# Patient Record
Sex: Male | Born: 1940 | Race: White | Hispanic: No | Marital: Married | Smoking: Never smoker
Health system: Southern US, Community
[De-identification: ages and names within clinical notes are randomized; demographics above are authoritative.]

## PROBLEM LIST (undated history)

## (undated) DIAGNOSIS — R011 Cardiac murmur, unspecified: Secondary | ICD-10-CM

## (undated) DIAGNOSIS — I442 Atrioventricular block, complete: Secondary | ICD-10-CM

## (undated) DIAGNOSIS — F32A Depression, unspecified: Secondary | ICD-10-CM

## (undated) DIAGNOSIS — I509 Heart failure, unspecified: Secondary | ICD-10-CM

## (undated) DIAGNOSIS — I447 Left bundle-branch block, unspecified: Secondary | ICD-10-CM

## (undated) DIAGNOSIS — E119 Type 2 diabetes mellitus without complications: Secondary | ICD-10-CM

## (undated) DIAGNOSIS — Z7722 Contact with and (suspected) exposure to environmental tobacco smoke (acute) (chronic): Secondary | ICD-10-CM

## (undated) DIAGNOSIS — F329 Major depressive disorder, single episode, unspecified: Secondary | ICD-10-CM

## (undated) DIAGNOSIS — C9110 Chronic lymphocytic leukemia of B-cell type not having achieved remission: Secondary | ICD-10-CM

## (undated) DIAGNOSIS — C6290 Malignant neoplasm of unspecified testis, unspecified whether descended or undescended: Secondary | ICD-10-CM

## (undated) DIAGNOSIS — I350 Nonrheumatic aortic (valve) stenosis: Secondary | ICD-10-CM

## (undated) DIAGNOSIS — E785 Hyperlipidemia, unspecified: Secondary | ICD-10-CM

## (undated) DIAGNOSIS — M1990 Unspecified osteoarthritis, unspecified site: Secondary | ICD-10-CM

## (undated) DIAGNOSIS — T4145XA Adverse effect of unspecified anesthetic, initial encounter: Secondary | ICD-10-CM

## (undated) HISTORY — DX: Malignant neoplasm of unspecified testis, unspecified whether descended or undescended: C62.90

## (undated) HISTORY — PX: CYST REMOVAL NECK: SHX6281

## (undated) HISTORY — DX: Contact with and (suspected) exposure to environmental tobacco smoke (acute) (chronic): Z77.22

## (undated) HISTORY — PX: EYE SURGERY: SHX253

## (undated) HISTORY — DX: Cardiac murmur, unspecified: R01.1

## (undated) HISTORY — DX: Hyperlipidemia, unspecified: E78.5

## (undated) HISTORY — PX: OTHER SURGICAL HISTORY: SHX169

## (undated) HISTORY — DX: Type 2 diabetes mellitus without complications: E11.9

## (undated) HISTORY — DX: Nonrheumatic aortic (valve) stenosis: I35.0

## (undated) HISTORY — DX: Atrioventricular block, complete: I44.2

## (undated) HISTORY — DX: Major depressive disorder, single episode, unspecified: F32.9

## (undated) HISTORY — PX: INNER EAR SURGERY: SHX679

## (undated) HISTORY — DX: Depression, unspecified: F32.A

## (undated) HISTORY — DX: Chronic lymphocytic leukemia of B-cell type not having achieved remission: C91.10

---

## 2000-07-10 ENCOUNTER — Ambulatory Visit (HOSPITAL_COMMUNITY): Admission: RE | Admit: 2000-07-10 | Discharge: 2000-07-10 | Payer: Self-pay | Admitting: Physical Therapy

## 2001-04-25 ENCOUNTER — Encounter: Payer: Self-pay | Admitting: *Deleted

## 2001-04-25 ENCOUNTER — Ambulatory Visit (HOSPITAL_COMMUNITY): Admission: RE | Admit: 2001-04-25 | Discharge: 2001-04-25 | Payer: Self-pay | Admitting: *Deleted

## 2001-12-31 ENCOUNTER — Ambulatory Visit (HOSPITAL_COMMUNITY): Admission: RE | Admit: 2001-12-31 | Discharge: 2001-12-31 | Payer: Self-pay | Admitting: *Deleted

## 2001-12-31 ENCOUNTER — Encounter: Payer: Self-pay | Admitting: *Deleted

## 2002-07-30 ENCOUNTER — Encounter (INDEPENDENT_AMBULATORY_CARE_PROVIDER_SITE_OTHER): Payer: Self-pay | Admitting: Specialist

## 2002-07-30 ENCOUNTER — Ambulatory Visit (HOSPITAL_BASED_OUTPATIENT_CLINIC_OR_DEPARTMENT_OTHER): Admission: RE | Admit: 2002-07-30 | Discharge: 2002-07-31 | Payer: Self-pay | Admitting: Otolaryngology

## 2002-07-30 HISTORY — PX: OTHER SURGICAL HISTORY: SHX169

## 2002-11-07 ENCOUNTER — Encounter: Payer: Self-pay | Admitting: *Deleted

## 2002-11-07 ENCOUNTER — Ambulatory Visit (HOSPITAL_COMMUNITY): Admission: RE | Admit: 2002-11-07 | Discharge: 2002-11-07 | Payer: Self-pay | Admitting: *Deleted

## 2003-10-15 ENCOUNTER — Ambulatory Visit (HOSPITAL_COMMUNITY): Admission: RE | Admit: 2003-10-15 | Discharge: 2003-10-15 | Payer: Self-pay | Admitting: Oncology

## 2004-10-13 ENCOUNTER — Ambulatory Visit: Payer: Self-pay | Admitting: Oncology

## 2004-10-14 ENCOUNTER — Ambulatory Visit (HOSPITAL_COMMUNITY): Admission: RE | Admit: 2004-10-14 | Discharge: 2004-10-14 | Payer: Self-pay | Admitting: Oncology

## 2005-10-12 ENCOUNTER — Ambulatory Visit: Payer: Self-pay | Admitting: Oncology

## 2005-10-20 ENCOUNTER — Ambulatory Visit (HOSPITAL_COMMUNITY): Admission: RE | Admit: 2005-10-20 | Discharge: 2005-10-20 | Payer: Self-pay | Admitting: Oncology

## 2005-10-26 ENCOUNTER — Ambulatory Visit (HOSPITAL_COMMUNITY): Admission: RE | Admit: 2005-10-26 | Discharge: 2005-10-26 | Payer: Self-pay | Admitting: Oncology

## 2006-10-09 ENCOUNTER — Ambulatory Visit: Payer: Self-pay | Admitting: Oncology

## 2006-10-12 ENCOUNTER — Ambulatory Visit (HOSPITAL_COMMUNITY): Admission: RE | Admit: 2006-10-12 | Discharge: 2006-10-12 | Payer: Self-pay | Admitting: Oncology

## 2006-10-12 LAB — COMPREHENSIVE METABOLIC PANEL
ALT: 31 U/L (ref 0–53)
AST: 21 U/L (ref 0–37)
Alkaline Phosphatase: 77 U/L (ref 39–117)
BUN: 22 mg/dL (ref 6–23)
Chloride: 107 mEq/L (ref 96–112)
Creatinine, Ser: 1.15 mg/dL (ref 0.40–1.50)
Potassium: 4.4 mEq/L (ref 3.5–5.3)

## 2006-10-12 LAB — CBC WITH DIFFERENTIAL/PLATELET
BASO%: 0.5 % (ref 0.0–2.0)
EOS%: 2.1 % (ref 0.0–7.0)
LYMPH%: 30.8 % (ref 14.0–48.0)
MCH: 33.3 pg (ref 28.0–33.4)
MCHC: 35.2 g/dL (ref 32.0–35.9)
MONO#: 0.4 10*3/uL (ref 0.1–0.9)
NEUT%: 58.2 % (ref 40.0–75.0)
RBC: 4.19 10*6/uL — ABNORMAL LOW (ref 4.20–5.71)
WBC: 4.5 10*3/uL (ref 4.0–10.0)
lymph#: 1.4 10*3/uL (ref 0.9–3.3)

## 2006-10-12 LAB — MORPHOLOGY
PLT EST: ADEQUATE
RBC Comments: NORMAL

## 2006-11-08 ENCOUNTER — Ambulatory Visit (HOSPITAL_COMMUNITY): Admission: RE | Admit: 2006-11-08 | Discharge: 2006-11-08 | Payer: Self-pay | Admitting: Family Medicine

## 2006-11-13 ENCOUNTER — Ambulatory Visit: Payer: Self-pay | Admitting: Internal Medicine

## 2006-11-19 ENCOUNTER — Encounter: Payer: Self-pay | Admitting: Internal Medicine

## 2006-11-19 ENCOUNTER — Ambulatory Visit: Payer: Self-pay

## 2007-01-24 ENCOUNTER — Ambulatory Visit: Payer: Self-pay | Admitting: Internal Medicine

## 2007-10-01 ENCOUNTER — Ambulatory Visit: Payer: Self-pay | Admitting: Internal Medicine

## 2007-10-01 ENCOUNTER — Encounter: Payer: Self-pay | Admitting: Internal Medicine

## 2007-10-09 ENCOUNTER — Ambulatory Visit: Payer: Self-pay | Admitting: Oncology

## 2007-10-11 ENCOUNTER — Ambulatory Visit (HOSPITAL_COMMUNITY): Admission: RE | Admit: 2007-10-11 | Discharge: 2007-10-11 | Payer: Self-pay | Admitting: Oncology

## 2007-10-11 LAB — CBC WITH DIFFERENTIAL/PLATELET
Basophils Absolute: 0 10*3/uL (ref 0.0–0.1)
Eosinophils Absolute: 0.1 10*3/uL (ref 0.0–0.5)
HCT: 43.1 % (ref 38.7–49.9)
HGB: 14.4 g/dL (ref 13.0–17.1)
LYMPH%: 20.7 % (ref 14.0–48.0)
MCV: 95.7 fL (ref 81.6–98.0)
MONO#: 0.5 10*3/uL (ref 0.1–0.9)
MONO%: 8.2 % (ref 0.0–13.0)
NEUT#: 4.3 10*3/uL (ref 1.5–6.5)
NEUT%: 69.2 % (ref 40.0–75.0)
Platelets: 193 10*3/uL (ref 145–400)
RBC: 4.5 10*6/uL (ref 4.20–5.71)

## 2007-10-11 LAB — COMPREHENSIVE METABOLIC PANEL
Albumin: 4.2 g/dL (ref 3.5–5.2)
BUN: 20 mg/dL (ref 6–23)
CO2: 28 mEq/L (ref 19–32)
Calcium: 9.3 mg/dL (ref 8.4–10.5)
Chloride: 107 mEq/L (ref 96–112)
Glucose, Bld: 107 mg/dL — ABNORMAL HIGH (ref 70–99)
Potassium: 4.1 mEq/L (ref 3.5–5.3)
Sodium: 144 mEq/L (ref 135–145)
Total Protein: 6.6 g/dL (ref 6.0–8.3)

## 2007-10-11 LAB — PSA: PSA: 4.16 ng/mL — ABNORMAL HIGH (ref 0.10–4.00)

## 2007-10-16 ENCOUNTER — Ambulatory Visit: Payer: Self-pay | Admitting: Internal Medicine

## 2008-11-13 ENCOUNTER — Ambulatory Visit: Payer: Self-pay

## 2008-11-13 ENCOUNTER — Encounter: Payer: Self-pay | Admitting: Internal Medicine

## 2008-12-04 DIAGNOSIS — I359 Nonrheumatic aortic valve disorder, unspecified: Secondary | ICD-10-CM | POA: Insufficient documentation

## 2008-12-04 DIAGNOSIS — C9190 Lymphoid leukemia, unspecified not having achieved remission: Secondary | ICD-10-CM | POA: Insufficient documentation

## 2008-12-04 DIAGNOSIS — E785 Hyperlipidemia, unspecified: Secondary | ICD-10-CM | POA: Insufficient documentation

## 2008-12-04 DIAGNOSIS — F329 Major depressive disorder, single episode, unspecified: Secondary | ICD-10-CM | POA: Insufficient documentation

## 2008-12-04 DIAGNOSIS — F3289 Other specified depressive episodes: Secondary | ICD-10-CM | POA: Insufficient documentation

## 2008-12-04 DIAGNOSIS — C6290 Malignant neoplasm of unspecified testis, unspecified whether descended or undescended: Secondary | ICD-10-CM | POA: Insufficient documentation

## 2008-12-04 DIAGNOSIS — J45909 Unspecified asthma, uncomplicated: Secondary | ICD-10-CM | POA: Insufficient documentation

## 2008-12-07 ENCOUNTER — Ambulatory Visit: Payer: Self-pay | Admitting: Internal Medicine

## 2008-12-07 ENCOUNTER — Encounter: Payer: Self-pay | Admitting: Internal Medicine

## 2009-11-25 ENCOUNTER — Ambulatory Visit (HOSPITAL_COMMUNITY): Admission: RE | Admit: 2009-11-25 | Discharge: 2009-11-25 | Payer: Self-pay | Admitting: Internal Medicine

## 2009-11-25 ENCOUNTER — Encounter: Payer: Self-pay | Admitting: Internal Medicine

## 2009-11-25 ENCOUNTER — Ambulatory Visit: Payer: Self-pay | Admitting: Cardiology

## 2009-11-25 ENCOUNTER — Ambulatory Visit: Payer: Self-pay

## 2009-12-15 ENCOUNTER — Ambulatory Visit: Payer: Self-pay | Admitting: Internal Medicine

## 2010-06-03 ENCOUNTER — Telehealth: Payer: Self-pay | Admitting: Internal Medicine

## 2010-07-27 ENCOUNTER — Ambulatory Visit: Payer: Self-pay | Admitting: Internal Medicine

## 2010-09-27 NOTE — Assessment & Plan Note (Signed)
Summary: f1y/ appt is 1200/ gd   Visit Type:  Follow-up Primary Provider:  Dr Johnn Hai  CC:  Got bad headache when lifting a piece of wood.  History of Present Illness: [name] is 70 y/o male with h/o moderate AS and hyperlipidemia returns for routine f/u.  Doing well. Much less active after his dog died. Able to do almost all of his activities without any CP, SOB, dizziness of HF symptoms. Feels winded if he walks 3 or 4 flights of steps. No change.  Echo last month showed stable aortic stensosis in the moderate (mean 23mm HG) with normal LV function.+ pseudonormal filling  No edema, orthopnea or PND. Was on ladder lifting heavy piece of wood and got headache.   Current Medications (verified): 1)  Trazodone Hcl 50 Mg Tabs (Trazodone Hcl) .... 3 By Mouth Once Daily 2)  Zoloft 100 Mg Tabs (Sertraline Hcl) .Marland Kitchen.. 1 By Mouth Once Daily 3)  Buspirone Hcl 15 Mg Tabs (Buspirone Hcl) .Marland Kitchen.. 1 Tab in The Am and 2 Tab At Noon 4)  Veramyst 27.5 Mcg/spray Susp (Fluticasone Furoate) .... Once Daily 5)  Clarinex 5 Mg Tabs (Desloratadine) .Marland Kitchen.. 1 By Mouth Once Daily 6)  Gabapentin 300 Mg Caps (Gabapentin) .Marland Kitchen.. 1 in The Am and 2 At [facility] 7)  Lipitor 80 Mg Tabs (Atorvastatin Calcium) .... Take 1/2 Once Daily 8)  Cholestyramine  Powd (Cholestyramine) .... 2 Scoops Once Daily 9)  Restasis 0.05 % Emul (Cyclosporine) .... Two Times A Day 10)  Apap 500 Mg Caps (Acetaminophen) .Marland Kitchen.. 1 By Mouth Once Daily 11)  Lovaza 1 Gm Caps (Omega-3-Acid Ethyl Esters) .... 4 Once Daily 12)  Proair Hfa 108 (90 Base) Mcg/act Aers (Albuterol Sulfate) .... Prn 13)  Celebrex 200 Mg Caps (Celecoxib) .... As Needed 14)  Multivitamins  Tabs (Multiple Vitamin) .... Take 1 Once Daily 15)  Aspirin 81 Mg Tabs (Aspirin) .... Take 1 Once Daily  Allergies (verified): 1)  ! Penicillin  Past History:  Past Medical History: Last updated: 12/04/2008 1. Aortic stenosis, moderate     a. Echo 3/10: EF 55-60%. Mean gradient 25 mmHg. AVA  1.23cm2     b. Exercise Myoview 2008: Positive ECG. Normal perfusion.  2. Extensive exposure to secondhand smoke. 3. Testicular cancer s/p XRT and surgical resection  4. Chronic lymphocytic leukemia followed by Dr. Cyndie Chime. 5. Hyperlipidemia, treated. 6. Depression.  7. Asthma  Review of Systems       As per HPI and past medical history; otherwise all systems negative.   Vital Signs:  Patient profile:   70 year old male Height:      64 inches Weight:      143 pounds BMI:     24.63 Pulse rate:   64 / minute BP sitting:   118 / 62  (left arm) Cuff size:   regular  Vitals Entered By: Hardin Negus, RMA (December 15, 2009 12:08 PM)  Physical Exam  General:  Gen: well appearing. no resp difficulty HEENT: normal Neck: supple. no JVD. Carotids 2+ bilat; not bruits.  Cor: PMI nondisplaced. Regular rate & rhythm. No rubs, gallops. 2/6 mid-peaking AS murmur. S2 is mildly diminished Lungs: clear Abdomen: soft, nontender, nondistended. No hepatosplenomegaly. No bruits or masses. Good bowel sounds. Extremities: no cyanosis, clubbing, rash, edema Neuro: alert & orientedx3, cranial nerves grossly intact. moves all 4 extremities w/o difficulty. affect pleasant    Impression & Recommendations:  Problem # 1:  AORTIC STENOSIS (ICD-424.1) He has at least moderate AS but  remains only minimally symptomatic at worst without any evidence of LV dysfunction. We discussed the fact that he will likely need AVR in the relatively near future as his symptoms dictate but it doesn't appear as if he has progressed to that point yet. We will continue to follow him closely. We discussed symptoms to watch out for.  Other Orders: EKG w/ Interpretation (93000)  Patient Instructions: 1)  Follow up in 6 months

## 2010-09-27 NOTE — Progress Notes (Signed)
Summary: dose pt need echo  Phone Note Call from Patient Call back at Home Phone [phone]   Caller: Patient Reason for Call: Talk to Nurse, Talk to Doctor Summary of Call: pt wants to know if needs an echo prior to the Nov f/u appt Initial call taken by: Omer Jack,  June 03, 2010 10:41 AM  Follow-up for Phone Call        pt has been having 2 D Echo once a year.  Will clarify with MD if he  wants one sooner.  pt aware. Follow-up by: Charolotte Capuchin, RN,  June 03, 2010 10:53 AM     Appended Document: dose pt need echo would continue yearly echos (march) unless he is having symptoms of dyspnea, CP, CHF, dizziness or increasing fatigue.   Appended Document: dose pt need echo Pt is aware.  He has also found out that he is diabetic. His diabetes is currently under control with diet and a "little bit" of exercise according to patient. Mylo Red RN

## 2010-09-27 NOTE — Assessment & Plan Note (Signed)
Summary: f60m   Visit Type:  Follow-up Primary Provider:  Dr Johnn Hai  CC:  no complaints.  History of Present Illness: [name] is 70 y/o male with h/o moderate AS and hyperlipidemia returns for routine f/u.  Doing well. 2 months diagnosed with DM2. Started plan of diet and exerise. At first dyspneic on hills but now better. Walks 3.5 mph. No CP. No dizziness. No edema, orthopnea or PND.  Echo in March 2011 showed stable aortic stensosis in the moderate (mean 23mm HG) with normal LV function.+ pseudonormal filling    Current Medications (verified): 1)  Trazodone Hcl 50 Mg Tabs (Trazodone Hcl) .... 3 By Mouth Once Daily 2)  Zoloft 100 Mg Tabs (Sertraline Hcl) .Marland Kitchen.. 1 By Mouth Once Daily 3)  Buspirone Hcl 15 Mg Tabs (Buspirone Hcl) .Marland Kitchen.. 1 Tab in The Am and 2 Tab At Noon 4)  Flonase 50 Mcg/act Susp (Fluticasone Propionate) .... Once Daily 5)  Clarinex 5 Mg Tabs (Desloratadine) .Marland Kitchen.. 1 By Mouth Once Daily 6)  Gabapentin 300 Mg Caps (Gabapentin) .Marland Kitchen.. 1 in The Am and 2 At [facility] 7)  Lipitor 80 Mg Tabs (Atorvastatin Calcium) .... Take 1/2 Once Daily 8)  Cholestyramine  Powd (Cholestyramine) .Marland Kitchen.. 1 Scoops Once Daily 9)  Restasis 0.05 % Emul (Cyclosporine) .... Two Times A Day 10)  Acetaminophen 500 Mg  Caps (Acetaminophen) .... 3 Daily 11)  Lovaza 1 Gm Caps (Omega-3-Acid Ethyl Esters) .... 4 Once Daily 12)  Multivitamins  Tabs (Multiple Vitamin) .... Take 1 Once Daily 13)  Aspirin 81 Mg Tabs (Aspirin) .... Take 1 Once Daily 14)  Fenofibrate 160 Mg Tabs (Fenofibrate) .... Once Daily  Allergies (verified): 1)  ! Penicillin  Past History:  Past Medical History: 1. Aortic stenosis, moderate     a. Echo 3/10: EF 55-60%. Mean gradient 25 mmHg. AVA 1.23cm2     b. Ech 3/11: Normal EF. Mean AVA gradient      b. Exercise Myoview 2008: Positive ECG. Normal perfusion.  2. Extensive exposure to secondhand smoke. 3. Testicular cancer s/p XRT and surgical resection  4. Chronic lymphocytic  leukemia followed by Dr. Cyndie Chime. 5. Hyperlipidemia, treated. 6. Depression.  7. Asthma 8. DM2  Review of Systems       As per HPI and past medical history; otherwise all systems negative.   Vital Signs:  Patient profile:   70 year old male Height:      64 inches Weight:      132 pounds BMI:     22.74 Pulse rate:   62 / minute BP sitting:   108 / 60  (right arm) Cuff size:   regular  Vitals Entered By: Hardin Negus, RMA (July 27, 2010 8:41 AM)  Physical Exam  General:  well appearing. no resp difficulty HEENT: normal Neck: supple. no JVD. Carotids 2+ bilat with bilat radiated bruits.  Cor: PMI nondisplaced. Regular rate & rhythm. No rubs, gallops. 2/6 mid-peaking AS murmur. S2 is still fairly crisp Lungs: clear Abdomen: soft, nontender, nondistended. No hepatosplenomegaly. No bruits or masses. Good bowel sounds. Extremities: no cyanosis, clubbing, rash, edema Neuro: alert & orientedx3, cranial nerves grossly intact. moves all 4 extremities w/o difficulty. affect pleasant    Impression & Recommendations:  Problem # 1:  AORTIC STENOSIS (ICD-424.1) AS very stable. Exercise tolerance preserved. Repeat echo in March. We discussed fact that if/when he needs AVR in future percutaneous AVRs will likely be commonplace.   Other Orders: EKG w/ Interpretation (93000) Echocardiogram (Echo)  Patient  Instructions: 1)  Your physician recommends that you schedule a follow-up appointment in: 9 months 2)  Your physician has requested that you have an echocardiogram.  Echocardiography is a painless test that uses sound waves to create images of your heart. It provides your doctor with information about the size and shape of your heart and how well your heart's chambers and valves are working.  This procedure takes approximately one hour. There are no restrictions for this procedure.

## 2010-10-31 ENCOUNTER — Ambulatory Visit (HOSPITAL_COMMUNITY): Payer: Medicare Other | Attending: Cardiology

## 2010-10-31 DIAGNOSIS — C9590 Leukemia, unspecified not having achieved remission: Secondary | ICD-10-CM | POA: Insufficient documentation

## 2010-10-31 DIAGNOSIS — E785 Hyperlipidemia, unspecified: Secondary | ICD-10-CM | POA: Insufficient documentation

## 2010-10-31 DIAGNOSIS — I359 Nonrheumatic aortic valve disorder, unspecified: Secondary | ICD-10-CM | POA: Insufficient documentation

## 2010-10-31 DIAGNOSIS — E119 Type 2 diabetes mellitus without complications: Secondary | ICD-10-CM | POA: Insufficient documentation

## 2010-11-03 ENCOUNTER — Telehealth: Payer: Self-pay | Admitting: Internal Medicine

## 2010-11-08 NOTE — Progress Notes (Signed)
Summary: test results  Phone Note Call from Patient Call back at Work Phone [phone]   Caller: Patient Reason for Call: Talk to Doctor, Lab or Test Results Initial call taken by: Lorne Skeens,  November 03, 2010 9:31 AM  Follow-up for Phone Call        [facility] Katina Dung, RN, BSN  November 03, 2010 9:45 AM --I discussed echo results from 10/31/10 with pt

## 2011-01-10 NOTE — Assessment & Plan Note (Signed)
[name]                            CARDIOLOGY OFFICE NOTE   Skinner, Tyler                       MRN:          [id number]  DATE:10/16/2007                            DOB:          [birth year]    PRIMARY CARE PHYSICIAN:  Emeterio Reeve, MD   INTERVAL HISTORY:  [name] is a delightful 70 year old male with a  history of mild to moderate aortic stenosis, hyperlipidemia and chronic  lymphocytic leukemia who returns today for routine followup.   He continues to do well.  He denies any chest pain or shortness of  breath.  He is walking about half mile a little bit more twice a day  with his dog.  He has not had any problems going up hills or other  symptoms.  No orthopnea, no PND.  No pre-syncope.   CURRENT MEDICATIONS:  1. Trazodone 50 mg a night.  2. Zoloft 100 a day.  3. Buspirone 50 mg t.i.d.  4. Nasonex.  5. Clarinex.  6. Gabapentin 300 t.i.d.  7. Lipitor 40 day.  8. Cholestyramine two scoops daily.  9. Restasis one drop b.i.d.  10.Lovaza 1 gram 4 tablets daily.   PHYSICAL EXAM:  He is well-appearing in no acute distress. He ambulates  around the clinic without respiratory difficulty.  Blood pressure is  100/58, heart rate 70,  weight is 144.  HEENT is normal.  Neck is supple.  There is no JVD.  Carotids 2+ bilaterally with  bilateral bruits.  There is no lymphadenopathy or thyromegaly.  CARDIAC:  PMI is nondisplaced.  Irregular rate and rhythm with 3/6  systolic ejection murmur at the right sternal border which radiates  across the precordium.  S2 is preserved.  LUNGS:  Clear.  ABDOMEN:  Soft, nontender, nondistended.  There is no  hepatosplenomegaly, no bruits, no masses.  Good bowel sounds.  EXTREMITIES:  Warm, cyanosis, clubbing or edema.  No rash.  NEURO:  Alert and oriented x3.  Cranial nerves II-XII are intact.  Moves  all four extremities without difficulty.  Affect is pleasant.   EKG shows sinus rhythm with no  significant ST-T wave abnormalities. Rate  of 70.  Echocardiogram shows an ejection fraction of 60%.  There is  moderate LVH.  His aortic valve was moderate to markedly calcified.  There is a mean of the mean gradient across which is 21.  However, the  valve area calculated 0.9.   ASSESSMENT/PLAN:  1. Aortic stenosis. This is in the moderate range.  He is      asymptomatic. Continued followup with yearly echocardiograms.  2. Hyperlipidemia.  This is followed by Dr. Paulino Rily. I asked him to      send a copy of his lipid panel when he gets a chance.   DISPOSITION:  Will see him back in 1 year with a repeat echocardiogram.     Bevelyn Buckles. Bensimhon, MD  Electronically Signed    DRB/MedQ  DD: 10/16/2007  DT: 10/16/2007  Job #: [id number]   cc:   Emeterio Reeve, MD

## 2011-01-10 NOTE — Assessment & Plan Note (Signed)
[facility]                            CARDIOLOGY OFFICE NOTE   DREAM, NODAL                       MRN:          [id number]  DATE:01/24/2007                            DOB:          1940/11/10    PRIMARY CARE PHYSICIAN:  Dr. Mila Palmer.   INTERVAL HISTORY:  Mr. Capano is a delightful 70 year old male with a  history of mild to moderate aortic stenosis, hyperlipidemia, and chronic  lymphocytic leukemia who returns today for routine followup.   Since we last saw him he also underwent stress testing, he walked 9  minutes on a Bruce protocol; stopped due to fatigue, no chest pain.  EKG  was positive, but perfusion imaging was normal.  He tells me today that  he is doing quite well.  He continues to walk frequently with his dog  and says he only gets short of breath when he really pushes it up the  hills, otherwise he is doing fine.  No chest pain, no orthopnea, no PND,  no lower extremity edema.   CURRENT MEDICATIONS:  1. Trazodone 150 a day.  2. Zoloft 100 a day.  3. Buspirone 15 mg t.i.d.  4. Nasonex.  5. Clarinex.  6. Neurontin 300 t.i.d.  7. Lipitor 80 a day.  8. Cholestyramine 2 scoops a day.  9. Restasis 1 drop b.i.d.  10.Tylenol.   PHYSICAL EXAMINATION:  He is well-appearing, no acute distress,  ambulates around the clinic without and respiratory difficulty.  Blood  pressure is 126/68, heart rate is 78, weight is 149.  HEENT:  Normal.  NECK:  Supple, no JVD, carotids are 2+ bilaterally with bilateral  radiated bruits, there is no lymphadenopathy or thyromegaly.  CARDIAC:  He has a regular rate and rhythm with a 2/6 mid-peaking  systolic ejection murmur at the right sternal border, S2 is crisp.  LUNGS:  Clear.  ABDOMEN:  Soft, nontender, nondistended, no hepatosplenomegaly, no  bruits, no masses, good bowel sounds.  EXTREMITIES:  Warm with no cyanosis, clubbing, or edema, no rash, good  distal pulses.    ASSESSMENT/PLAN:  1. Aortic stenosis, this is mild to moderate with a mean gradient of      14.  He does have mild symptoms from this but is obviously not      ready for surgical intervention.  We will see him back in 1 year      for yearly followup.  2. Hyperlipidemia, this is followed by Dr. Paulino Rily.  I did ask him to      bring me his lipid values just so I could take a look at them.  3. Abnormal stress test.  With ST depression during exercise but      normal ejection fraction and no perfusion defects.  I suspect this      may be a false positive or related to his aortic stenosis.  Given      that he is asymptomatic I will not pursue any further.  Obviously      if his symptoms were to progress or he would need aortic valve  replacement then he would need to proceed with catheterization.   DISPOSITION:  We will see him back in 9 months for his echocardiogram.     Bevelyn Buckles. Bensimhon, MD  Electronically Signed    DRB/MedQ  DD: 01/24/2007  DT: 01/24/2007  Job #: [id number]   cc:   Emeterio Reeve, MD

## 2011-01-13 NOTE — Op Note (Signed)
[name], [name]                          ACCOUNT NO.:  [id number]   MEDICAL RECORD NO.:  [id number]                   PATIENT TYPE:  AMB   LOCATION:  [facility]SC                                  FACILITY:  [facility]   PHYSICIAN:  Jefry H. Pollyann Kennedy, M.D.                DATE OF BIRTH:  06/11/1941   DATE OF PROCEDURE:  07/30/2002  DATE OF DISCHARGE:                                 OPERATIVE REPORT   PREOPERATIVE DIAGNOSES:  1. Right conductive hearing loss.  2. Suspicious for otosclerosis.   POSTOPERATIVE DIAGNOSES:  1. Right conductive hearing loss.  2. Suspicious for otosclerosis.   OPERATION PERFORMED:  Right stapedectomy.   SURGEON:  Jefry H. Pollyann Kennedy, M.D.   ANESTHESIA:  General endotracheal.   COMPLICATIONS:  None.   DISPOSITION:  The patient tolerated the procedure well, was awakened  extubated and transferred to recovery in stable condition.   FINDINGS:  Stiff stapes foot plate, with otosclerotic changes surrounding  the annular ligament.  The slightly malpositioned incus lenticular process  angled posteriorly slightly away from the oval window niche.   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  The risks, benefits, alternatives  and complications of the procedure were explained to the patient, who  seemed to understand and agreed to surgery.   DESCRIPTION OF PROCEDURE:  The patient was taken to the operating room and  placed on the operating table in the supine position.  Following induction  of general endotracheal anesthesia which was complicated by difficulty with  intubation.  The patient was prepped and draped in standard fashion.  Using  the operating microscope and sterile technique, the ear canal was injected  with 1% Xylocaine with epinephrine in four quadrants.  There was good  blanching of the tympanic membrane.  A  posterior superior tympanomeatal flap  was developed and brought anteriorly.  The chorda tympani nerve was  identified and left in place.  The middle ear was exposed and the ossicles  were examined.  They did not have a visible round window reflex prior to the  manipulation of the ossicular chain.  The stapes was palpated and the above  mentioned findings were noted.  The incudostapedial joint was separated with  an IS knife.  A control hole was placed in the center of the footplate using  an otologic pick.  The stapedial tendon was cut using Bellucci scissors.  There was no overhanging facial nerve.  The stapes superstructure was down-  fractured toward the promontory and the anterior two thirds of the foot  plate came up along with the superstructure.  This was easily removed.  The  posterior one third or one quarter of the foot plate was left intact and was  obscured by the angled incus lenticular process and attempts  were not made  to remove this bone in this area.  A _______ modified bucket handle  prosthesis 4.5 mm length was used.  A previously harvested tragal cartilage  perichondral graft was placed onto the oval window niche.  The prosthesis  was placed into good position and the bucket handle was lifted over the  lenticular process.  It was nice and secure.  There was good ossicular chain  mobility.  The anterior tympanic cavity was packed with saline soaked  Gelfoam to plug the eustachian tube orifice.  The tympanomeatal flap was  brought back to its normal position and secured in place using saline soaked  Gelfoam and then the external auditory canal Cortisporin soaked Gelfoam.  A  5-0 plain gut suture was used to reapproximate the tragal skin from the  harvest.  A cotton ball with bacitracin was placed in the external meatus.  The patient then awakened, extubated and transferred to recovery in stable  condition.                                                 Jefry H.  Pollyann Kennedy, M.D.    JHR/MEDQ  D:  07/31/2002  T:  07/31/2002  Job:  [id number]

## 2011-01-13 NOTE — Procedures (Signed)
[facility]  Patient:    [name], [name]                       MRN: [id number] Proc. Date: 07/10/00 Attending:  Everardo All. Madilyn Fireman, M.D. CC:         Joycelyn Rua, M.D.   Procedure Report  PROCEDURE:  Colonoscopy.  INDICATION FOR PROCEDURE:  Hemoccult positive stools.  DESCRIPTION OF PROCEDURE:  The patient was placed in the left lateral decubitus position and placed on the pulse monitor with continuous low-flow oxygen delivered by nasal cannula.  He was sedated with 60 mg IV Demerol and 5 mg IV Versed.  The Olympus video colonoscope was inserted into the rectum and advanced to the cecum, confirmed by transillumination at McBurneys point and visualization of the ileocecal valve and appendiceal orifice.  The prep was good.  The cecum appeared normal.  In the ascending, transverse, descending, and sigmoid colon were seen scattered diverticula, moderately concentrated with no other mucosal abnormalities, polyps, or masses.  The rectum appeared normal, and retroflex view of the anus did reveal some small internal hemorrhoids.  The colonoscope was then withdrawn and the patient returned to the recovery room in stable condition.  He tolerated the procedure well, and there were no immediate complications.  IMPRESSION:  Right and left-sided diverticulosis, otherwise normal colonoscopy.  PLAN:  Continue average risk colon cancer screening with sigmoidoscopy in five years and annual Hemoccult. DD:  07/10/00 TD:  07/10/00 Job: [id number]

## 2011-01-13 NOTE — Assessment & Plan Note (Signed)
[facility]                            CARDIOLOGY OFFICE NOTE   QUILLAN, WHITTER                       MRN:          [id number]  DATE:11/13/2006                            DOB:          October 31, 1940    REFERRING PHYSICIAN:  Emeterio Reeve, MD   REASON FOR CONSULTATION:  Aortic stenosis.   Mr. Siracusa is a delightful 70 year old male with no history of known  coronary artery disease.  He does say that he has had a heart murmur for  a very, very long time, but this has never been worked up formally.  He  does have a history of childhood asthma, as well as extensive exposure  to secondhand smoke and hyperlipidemia.  He is followed by Dr.  Cyndie Chime for a history of testicular cancer, as well as chronic  lymphocytic leukemia.  At one of his recent visits with Dr. Cyndie Chime,  it was suggested that he get an echocardiogram to just evaluate his  murmur.  He underwent echocardiogram at [facility] about 2 weeks  ago, and was told he had critical aortic stenosis and he needed follow  up with cardiology.  He has been referred here for further evaluation.   He says that at times he feels sluggish but can ambulate freely on flat  ground.  He does get winded walking his dog up a hill, but feels like he  can walk a mile or two without difficulty.  His exertional dyspnea has  been quite stable.  He denies any chest pain.  He does state if he  stands up too quickly, he gets dizzy.  He has not had any significant  lower extremity edema, no orthopnea, no PND.  He has never had frank  syncope.   REVIEW OF SYSTEMS:  Notable for headaches, arthritis pain, asthma,  allergies, anxiety.  The remainder of the review of systems is negative,  except for HPI and problem list.   PAST MEDICAL HISTORY:  1. Childhood asthma, which has persisted.  2. Extensive exposure to secondhand smoke.  3. 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  4. Chronic lymphocytic leukemia followed by Dr. Cyndie Chime.  5. Hyperlipidemia, treated.  6. Depression.   CURRENT MEDICATIONS:  1. Trazodone.  2. Zoloft 100 mg a day.  3. Buspirone 15 mg t.i.d.  4. Nasonex.  5. Clarinex.  6. Gabapentin 300 t.i.d.  7. Lipitor 80 a day.  8. Cholestyramine.  9. Restasis.  10.Ibuprofen.   ALLERGIES:  PENICILLIN.   SOCIAL HISTORY:  He is married.  He has 4 children.  He works as a  Chartered certified accountant.  He denies tobacco or alcohol use.  He  does have a significant secondhand smoke exposure.   FAMILY HISTORY:  Mother died at 61 due to a brain cancer.  Father died  at 76 due to lung cancer.  He has a sister who is alive and well.   PHYSICAL EXAMINATION:  GENERAL:  He is a well-appearing male in no acute  distress.  He ambulates around the clinic without any  respiratory  difficulty.  VITAL SIGNS:  Blood pressure is 118/64, pulse is 78, weight is 154.  HEENT:  Sclerae anicteric.  Extraocular movements intact.  There is no  xanthelasma.  His mucous membranes are moist.  Oropharynx is clear.  NECK:  Supple.  There is no JVD.  Carotids are 2+ bilaterally with  bilateral radiated bruits.  There is no lymphadenopathy or thyromegaly.  CARDIAC:  Regular rate and rhythm.  He has a 2/6 mid peaking systolic  ejection murmur at the right sternal border.  His S2 is crisp.  There is  no rub or gallop.  LUNGS:  Clear.  ABDOMEN:  Mildly obese but nontender, nondistended.  There is no  hepatosplenomegaly.  He does have a radiated bruit from his aortic  valve.  There are no masses.  Good bowel sounds.  EXTREMITIES:  Warm with no clubbing, cyanosis, or edema.  Distal pulses  are 2+ bilaterally.  There is no rash.  NEUROLOGIC:  He is alert and oriented x3.  Cranial nerves II-XII are  intact.  Moves all 4 extremities without difficulty.  Affect is bright.   ELECTROCARDIOGRAM:  EKG shows normal sinus rhythm at a rate of 96.  No  significant  ST-T wave abnormalities.  There are fine U waves.   ECHOCARDIOGRAM:  Echocardiogram report from [facility] (we do not  have the actual tape) shows normal LV function with no evidence of LVH.  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  A mean gradient was 17 mmHg  with a calculated aortic valve area of 1.0 cm squared.  This was read as  moderate to severe aortic stenosis.   ASSESSMENT AND PLAN:  Aortic stenosis.  By his gradient symptoms and  exam, he truly has mild to moderate aortic stenosis, and I reassured him  about this.  I told him that I thought it was likely best just to  proceed with yearly screening echocardiograms.  Given his risk factors,  I do think it is reasonable to pursue a regular screening treadmill  Myoview to rule out underlying coronary artery disease.  We will try to  schedule this in the next week or two.   DISPOSITION:  He will return for a follow up in several months.   NOTE:  Time of consultation was approximately 1 hour to address  questions.     Bevelyn Buckles. Bensimhon, MD  Electronically Signed    DRB/MedQ  DD: 11/13/2006  DT: 11/14/2006  Job #: [id number]   cc:   Emeterio Reeve, MD

## 2011-01-31 ENCOUNTER — Encounter: Payer: Self-pay | Admitting: Internal Medicine

## 2011-03-03 ENCOUNTER — Ambulatory Visit (INDEPENDENT_AMBULATORY_CARE_PROVIDER_SITE_OTHER): Payer: Medicare Other | Admitting: Internal Medicine

## 2011-03-03 ENCOUNTER — Encounter: Payer: Self-pay | Admitting: Internal Medicine

## 2011-03-03 VITALS — BP 124/68 | HR 71 | Resp 18 | Ht 64.0 in | Wt 129.8 lb

## 2011-03-03 DIAGNOSIS — I350 Nonrheumatic aortic (valve) stenosis: Secondary | ICD-10-CM

## 2011-03-03 DIAGNOSIS — I359 Nonrheumatic aortic valve disorder, unspecified: Secondary | ICD-10-CM

## 2011-03-03 NOTE — Assessment & Plan Note (Signed)
Doing well. Asymptomatic. Aortic stenosis is stable. Continue to follow. Continue yearly echos.  We discussed that I would consider AVR if 1) valve or LV getting worse, 2) he becomes symptomatic or 3) he hits age 70 as I would worry that risk of AVR would go up significantly in his late 90s early 62s.

## 2011-03-03 NOTE — Progress Notes (Signed)
HPI:  [name] is 70 y/o male with h/o moderate AS, DM2 and hyperlipidemia returns for routine f/u.  Echo in March 2012 showed stable aortic stensosis in the moderate range (mean 25mm HG was 23 in 3/11) with normal LV function.+ pseudonormal filling  Doing well. Not doing his regular walking program anymore but walks up and down steps and active at work. No CP or dyspnea. No edema. Occasionally lightheaded when he stands up.     ROS: All systems negative except as listed in HPI, PMH and Problem List.  Past Medical History  Diagnosis Date  . Aortic stenosis     moderate. Echo 3/10: EF 55-60%. mean gradient 25 mmHg. AVA 1.23cm2. Echo 3/11: Normal EF. mean AVA gradient . exercise myoview 2008: positive ECG. normal perfusion  . Exposure to secondhand smoke     extensive exposure  . Testicular cancer     s/p XRT and surgical resection.  . Chronic lymphocytic leukemia     followed by Dr. Candyce Churn  . Hyperlipidemia     treated  . Depression   . Asthma   . DM2 (diabetes mellitus, type 2)     Current Outpatient Prescriptions  Medication Sig Dispense Refill  . acetaminophen (TYLENOL) 500 MG tablet Take 500 mg by mouth 3 (three) times daily.        Marland Kitchen aspirin 81 MG tablet Take 81 mg by mouth daily.        Marland Kitchen atorvastatin (LIPITOR) 80 MG tablet Take 40 mg by mouth daily.        . busPIRone (BUSPAR) 15 MG tablet Take 15 mg by mouth 3 (three) times daily. 1 tab in am, 2 tab at noon       . Cholestyramine POWD by Does not apply route daily.        . cycloSPORINE (RESTASIS) 0.05 % ophthalmic emulsion 1 drop 2 (two) times daily.        Marland Kitchen desloratadine (CLARINEX) 5 MG tablet Take 5 mg by mouth daily.        . fenofibrate 160 MG tablet Take 160 mg by mouth daily.        . fluticasone (FLONASE) 50 MCG/ACT nasal spray Place 2 sprays into the nose daily.        Marland Kitchen gabapentin (NEURONTIN) 300 MG capsule Take 300 mg by mouth 2 (two) times daily. 1 in am, 2 at noon       . Multiple Vitamin  (MULTIVITAMIN) tablet Take 1 tablet by mouth daily.        . sertraline (ZOLOFT) 100 MG tablet Take 100 mg by mouth daily.        . traZODone (DESYREL) 50 MG tablet Take 150 mg by mouth daily.        Marland Kitchen DISCONTD: omega-3 acid ethyl esters (LOVAZA) 1 G capsule Take 4 g by mouth daily.           PHYSICAL EXAM: Filed Vitals:   03/03/11 1007  BP: 124/68  Pulse: 71  Resp: 18   General:  well appearing. no resp difficulty HEENT: normal Neck: supple. no JVD. Carotids 2+ bilat with bilat radiated bruits.  Cor: PMI nondisplaced. Regular rate & rhythm. No rubs, gallops. 2/6 mid-peaking AS murmur. S2 is still fairly crisp Lungs: clear Abdomen: soft, nontender, nondistended. No hepatosplenomegaly. No bruits or masses. Good bowel sounds. Extremities: no cyanosis, clubbing, rash, edema Neuro: alert & orientedx3, cranial nerves grossly intact. moves all 4 extremities w/o difficulty. affect pleasant   ECG: NSR  71. RAE No ST-T wave abnormalities.     ASSESSMENT & PLAN:

## 2011-03-03 NOTE — Patient Instructions (Signed)
Your physician recommends that you schedule a follow-up appointment in:  6 MONTHS WITH Dr. Gala Romney. Your physician has requested that you have an echocardiogram. Echocardiography is a painless test that uses sound waves to create images of your heart. It provides your doctor with information about the size and shape of your heart and how well your heart's chambers and valves are working. This procedure takes approximately one hour. There are no restrictions for this procedure. In 6 months.

## 2011-04-03 ENCOUNTER — Other Ambulatory Visit: Payer: Self-pay | Admitting: Endodontics

## 2011-09-06 ENCOUNTER — Ambulatory Visit (HOSPITAL_COMMUNITY): Payer: Medicare Other | Attending: Cardiology | Admitting: Radiology

## 2011-09-06 DIAGNOSIS — E785 Hyperlipidemia, unspecified: Secondary | ICD-10-CM | POA: Diagnosis not present

## 2011-09-06 DIAGNOSIS — E119 Type 2 diabetes mellitus without complications: Secondary | ICD-10-CM | POA: Diagnosis not present

## 2011-09-06 DIAGNOSIS — I359 Nonrheumatic aortic valve disorder, unspecified: Secondary | ICD-10-CM | POA: Insufficient documentation

## 2011-10-31 ENCOUNTER — Ambulatory Visit (INDEPENDENT_AMBULATORY_CARE_PROVIDER_SITE_OTHER): Payer: Medicare Other | Admitting: Internal Medicine

## 2011-10-31 ENCOUNTER — Encounter: Payer: Self-pay | Admitting: Internal Medicine

## 2011-10-31 VITALS — BP 124/68 | HR 64 | Ht 63.0 in | Wt 131.1 lb

## 2011-10-31 DIAGNOSIS — I359 Nonrheumatic aortic valve disorder, unspecified: Secondary | ICD-10-CM | POA: Diagnosis not present

## 2011-10-31 DIAGNOSIS — I350 Nonrheumatic aortic (valve) stenosis: Secondary | ICD-10-CM

## 2011-10-31 NOTE — Progress Notes (Signed)
HPI:  [name] is 71 y/o male with h/o moderate AS, DM2 and hyperlipidemia returns for routine f/u.  Echo in March 2012 showed stable aortic stensosis in the moderate range (mean 25mm HG was 23 in 3/11) with normal LV function.+ pseudonormal filling  Echo in last month EF normal AS mean gradient up to 35.   I reviewed all echos with him personally.  Doing well. Not overly active. Not doing his regular walking program anymore but walks up and down steps and active at work. No CP or dyspnea. No edema. Occasionally lightheaded when he stands up.   About to get a lap dog and suspects he will be walking more.   ROS: All systems negative except as listed in HPI, PMH and Problem List.  Past Medical History  Diagnosis Date  . Aortic stenosis     moderate. Echo 3/10: EF 55-60%. mean gradient 25 mmHg. AVA 1.23cm2. Echo 3/11: Normal EF. mean AVA gradient . exercise myoview 2008: positive ECG. normal perfusion  . Exposure to secondhand smoke     extensive exposure  . Testicular cancer     s/p XRT and surgical resection.  . Chronic lymphocytic leukemia     followed by Dr. Candyce Churn  . Hyperlipidemia     treated  . Depression   . Asthma   . DM2 (diabetes mellitus, type 2)     Current Outpatient Prescriptions  Medication Sig Dispense Refill  . acetaminophen (TYLENOL) 500 MG tablet Take 500 mg by mouth 3 (three) times daily.        Marland Kitchen aspirin 81 MG tablet Take 81 mg by mouth daily.        Marland Kitchen atorvastatin (LIPITOR) 80 MG tablet Take 40 mg by mouth daily.        . busPIRone (BUSPAR) 15 MG tablet Take 15 mg by mouth 3 (three) times daily. 1 tab in am, 2 tab at noon       . Cholestyramine POWD by Does not apply route daily.        . cycloSPORINE (RESTASIS) 0.05 % ophthalmic emulsion 1 drop 2 (two) times daily.        Marland Kitchen desloratadine (CLARINEX) 5 MG tablet Take 5 mg by mouth daily.        . fenofibrate 160 MG tablet Take 160 mg by mouth daily.        . fluticasone (FLONASE) 50 MCG/ACT nasal  spray Place 2 sprays into the nose daily.        Marland Kitchen gabapentin (NEURONTIN) 300 MG capsule Take 300 mg by mouth 2 (two) times daily. 1 in am, 2 at noon       . Multiple Vitamin (MULTIVITAMIN) tablet Take 1 tablet by mouth daily.        . sertraline (ZOLOFT) 100 MG tablet Take 100 mg by mouth daily.        . traZODone (DESYREL) 50 MG tablet Take 150 mg by mouth daily.           PHYSICAL EXAM: Filed Vitals:   10/31/11 1209  BP: 124/68  Pulse: 64   General:  well appearing. no resp difficulty HEENT: normal Neck: supple. no JVD. Carotids 2+ bilat with bilat radiated bruits.  Cor: PMI nondisplaced. Regular rate & rhythm. No rubs, gallops. 2/6 mid-peaking AS murmur. S2 is still fairly crisp Lungs: clear Abdomen: soft, nontender, nondistended. No hepatosplenomegaly. No bruits or masses. Good bowel sounds. Extremities: no cyanosis, clubbing, rash, edema Neuro: alert & orientedx3, cranial nerves grossly intact.  moves all 4 extremities w/o difficulty. affect pleasant   ECG: NSR 64 No ST-T wave abnormalities.     ASSESSMENT & PLAN:

## 2011-10-31 NOTE — Patient Instructions (Signed)
Your physician wants you to follow-up in: 6 MONTHS IN [facility] [place]=[phone] You will receive a reminder letter in the mail two months in advance. If you don't receive a letter, please call our office to schedule the follow-up appointment.

## 2011-10-31 NOTE — Assessment & Plan Note (Signed)
AS worse by echo but s2 still fairly crisp. Remains asymptomatic though not overly active. We discussed the fact that his AS has progressed some and he will likely need AVR in the next year or two. Will watch closely for symptoms an let me know when they occur. We discussed open AVR versus percutaneous valve replacement. F/u in 6 months.

## 2012-03-21 DIAGNOSIS — N400 Enlarged prostate without lower urinary tract symptoms: Secondary | ICD-10-CM | POA: Diagnosis not present

## 2012-03-21 DIAGNOSIS — R972 Elevated prostate specific antigen [PSA]: Secondary | ICD-10-CM | POA: Diagnosis not present

## 2012-04-04 DIAGNOSIS — E119 Type 2 diabetes mellitus without complications: Secondary | ICD-10-CM | POA: Diagnosis not present

## 2012-04-04 DIAGNOSIS — H2510 Age-related nuclear cataract, unspecified eye: Secondary | ICD-10-CM | POA: Diagnosis not present

## 2012-04-04 DIAGNOSIS — H04129 Dry eye syndrome of unspecified lacrimal gland: Secondary | ICD-10-CM | POA: Diagnosis not present

## 2012-06-27 DIAGNOSIS — K921 Melena: Secondary | ICD-10-CM | POA: Diagnosis not present

## 2012-06-27 DIAGNOSIS — Z86010 Personal history of colonic polyps: Secondary | ICD-10-CM | POA: Diagnosis not present

## 2012-06-27 DIAGNOSIS — L290 Pruritus ani: Secondary | ICD-10-CM | POA: Diagnosis not present

## 2012-06-27 DIAGNOSIS — R197 Diarrhea, unspecified: Secondary | ICD-10-CM | POA: Diagnosis not present

## 2012-07-23 ENCOUNTER — Other Ambulatory Visit: Payer: Self-pay | Admitting: Gastroenterology

## 2012-07-23 DIAGNOSIS — K5520 Angiodysplasia of colon without hemorrhage: Secondary | ICD-10-CM | POA: Diagnosis not present

## 2012-07-23 DIAGNOSIS — K5730 Diverticulosis of large intestine without perforation or abscess without bleeding: Secondary | ICD-10-CM | POA: Diagnosis not present

## 2012-07-23 DIAGNOSIS — R197 Diarrhea, unspecified: Secondary | ICD-10-CM | POA: Diagnosis not present

## 2012-07-23 DIAGNOSIS — D126 Benign neoplasm of colon, unspecified: Secondary | ICD-10-CM | POA: Diagnosis not present

## 2012-07-23 DIAGNOSIS — K648 Other hemorrhoids: Secondary | ICD-10-CM | POA: Diagnosis not present

## 2012-07-23 DIAGNOSIS — Z86010 Personal history of colonic polyps: Secondary | ICD-10-CM | POA: Diagnosis not present

## 2012-07-23 DIAGNOSIS — R195 Other fecal abnormalities: Secondary | ICD-10-CM | POA: Diagnosis not present

## 2012-07-24 DIAGNOSIS — E1129 Type 2 diabetes mellitus with other diabetic kidney complication: Secondary | ICD-10-CM | POA: Diagnosis not present

## 2012-07-24 DIAGNOSIS — Z0000 Encounter for general adult medical examination without abnormal findings: Secondary | ICD-10-CM | POA: Diagnosis not present

## 2012-07-24 DIAGNOSIS — E782 Mixed hyperlipidemia: Secondary | ICD-10-CM | POA: Diagnosis not present

## 2012-07-24 DIAGNOSIS — N182 Chronic kidney disease, stage 2 (mild): Secondary | ICD-10-CM | POA: Diagnosis not present

## 2012-07-24 DIAGNOSIS — Z79899 Other long term (current) drug therapy: Secondary | ICD-10-CM | POA: Diagnosis not present

## 2012-07-24 DIAGNOSIS — Z23 Encounter for immunization: Secondary | ICD-10-CM | POA: Diagnosis not present

## 2012-07-24 DIAGNOSIS — N400 Enlarged prostate without lower urinary tract symptoms: Secondary | ICD-10-CM | POA: Diagnosis not present

## 2012-07-24 DIAGNOSIS — C9110 Chronic lymphocytic leukemia of B-cell type not having achieved remission: Secondary | ICD-10-CM | POA: Diagnosis not present

## 2012-07-29 ENCOUNTER — Other Ambulatory Visit: Payer: Self-pay | Admitting: Gastroenterology

## 2012-07-29 DIAGNOSIS — K869 Disease of pancreas, unspecified: Secondary | ICD-10-CM

## 2012-08-01 ENCOUNTER — Ambulatory Visit
Admission: RE | Admit: 2012-08-01 | Discharge: 2012-08-01 | Disposition: A | Discharge status: Home / Self Care | Payer: Medicare Other | Source: Ambulatory Visit | Attending: Gastroenterology | Admitting: Gastroenterology

## 2012-08-01 DIAGNOSIS — N400 Enlarged prostate without lower urinary tract symptoms: Secondary | ICD-10-CM | POA: Diagnosis not present

## 2012-08-01 DIAGNOSIS — K869 Disease of pancreas, unspecified: Secondary | ICD-10-CM

## 2012-08-01 DIAGNOSIS — N200 Calculus of kidney: Secondary | ICD-10-CM | POA: Diagnosis not present

## 2012-08-01 DIAGNOSIS — R188 Other ascites: Secondary | ICD-10-CM | POA: Diagnosis not present

## 2012-08-01 MED ORDER — IOHEXOL 300 MG/ML  SOLN
100.0000 mL | Freq: Once | INTRAMUSCULAR | Status: AC | PRN
  Administered 2012-08-01: 100 mL via INTRAVENOUS

## 2012-08-05 ENCOUNTER — Telehealth: Payer: Self-pay | Admitting: *Deleted

## 2012-08-05 NOTE — Telephone Encounter (Signed)
Dr. Randa Evens called requesting Dr. Cyndie Chime to return call regarding pt.  Pager # [phone].  Dr. Randa Evens made aware that Cyndie Chime will not be back until 08/07/12 and he stated that was fine.

## 2012-08-06 DIAGNOSIS — R1900 Intra-abdominal and pelvic swelling, mass and lump, unspecified site: Secondary | ICD-10-CM | POA: Diagnosis not present

## 2012-08-07 ENCOUNTER — Telehealth: Payer: Self-pay | Admitting: *Deleted

## 2012-08-07 ENCOUNTER — Other Ambulatory Visit: Payer: Self-pay | Admitting: Oncology

## 2012-08-07 NOTE — Telephone Encounter (Signed)
Received call from pt stating that he had a CT Scan done 07/29/12 "which showed possible testicular cancer" and wants to make an appt with Dr. Cyndie Chime as soon as possible.  Note to Dr. Cyndie Chime.

## 2012-08-09 ENCOUNTER — Telehealth: Payer: Self-pay | Admitting: *Deleted

## 2012-08-09 ENCOUNTER — Other Ambulatory Visit: Payer: Self-pay | Admitting: Oncology

## 2012-08-09 ENCOUNTER — Other Ambulatory Visit: Payer: Self-pay | Admitting: *Deleted

## 2012-08-09 ENCOUNTER — Telehealth: Payer: Self-pay | Admitting: Oncology

## 2012-08-09 DIAGNOSIS — R1900 Intra-abdominal and pelvic swelling, mass and lump, unspecified site: Secondary | ICD-10-CM | POA: Diagnosis not present

## 2012-08-09 NOTE — Telephone Encounter (Signed)
Pt called wanting to leave another ph # to contact him for PET scheduling.  It is his cell # [phone] which is already in the computer.

## 2012-08-09 NOTE — Telephone Encounter (Signed)
Pet scan precert insurance, pt will be called by Radiology scheduling

## 2012-08-12 DIAGNOSIS — R1900 Intra-abdominal and pelvic swelling, mass and lump, unspecified site: Secondary | ICD-10-CM | POA: Diagnosis not present

## 2012-08-12 DIAGNOSIS — Z86010 Personal history of colonic polyps: Secondary | ICD-10-CM | POA: Diagnosis not present

## 2012-08-12 DIAGNOSIS — C9112 Chronic lymphocytic leukemia of B-cell type in relapse: Secondary | ICD-10-CM | POA: Diagnosis not present

## 2012-08-12 DIAGNOSIS — N182 Chronic kidney disease, stage 2 (mild): Secondary | ICD-10-CM | POA: Diagnosis not present

## 2012-08-16 ENCOUNTER — Encounter: Payer: Self-pay | Admitting: Oncology

## 2012-08-16 ENCOUNTER — Encounter (HOSPITAL_COMMUNITY)
Admission: RE | Admit: 2012-08-16 | Discharge: 2012-08-16 | Disposition: A | Discharge status: Home / Self Care | Payer: Medicare Other | Source: Ambulatory Visit | Attending: Oncology | Admitting: Oncology

## 2012-08-16 DIAGNOSIS — K429 Umbilical hernia without obstruction or gangrene: Secondary | ICD-10-CM | POA: Insufficient documentation

## 2012-08-16 DIAGNOSIS — N200 Calculus of kidney: Secondary | ICD-10-CM | POA: Insufficient documentation

## 2012-08-16 DIAGNOSIS — K449 Diaphragmatic hernia without obstruction or gangrene: Secondary | ICD-10-CM | POA: Insufficient documentation

## 2012-08-16 DIAGNOSIS — I700 Atherosclerosis of aorta: Secondary | ICD-10-CM | POA: Insufficient documentation

## 2012-08-16 DIAGNOSIS — R1909 Other intra-abdominal and pelvic swelling, mass and lump: Secondary | ICD-10-CM | POA: Diagnosis not present

## 2012-08-16 DIAGNOSIS — J320 Chronic maxillary sinusitis: Secondary | ICD-10-CM | POA: Insufficient documentation

## 2012-08-16 DIAGNOSIS — K5730 Diverticulosis of large intestine without perforation or abscess without bleeding: Secondary | ICD-10-CM | POA: Insufficient documentation

## 2012-08-16 DIAGNOSIS — R1900 Intra-abdominal and pelvic swelling, mass and lump, unspecified site: Secondary | ICD-10-CM | POA: Insufficient documentation

## 2012-08-16 LAB — GLUCOSE, CAPILLARY: Glucose-Capillary: 102 mg/dL — ABNORMAL HIGH (ref 70–99)

## 2012-08-16 MED ORDER — FLUDEOXYGLUCOSE F - 18 (FDG) INJECTION
18.0000 | Freq: Once | INTRAVENOUS | Status: AC | PRN
  Administered 2012-08-16: 18 via INTRAVENOUS

## 2012-08-16 NOTE — Progress Notes (Signed)
I called and spoke with the patient's wife to review results of today's PET scan. 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 He was recently evaluated for atypical abdominal symptoms by Dr. Carman Ching. CT scan showed a calcified soft tissue mass in the mesentery. 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 PET scan does show metabolic uptake in this area with an SUV of 5.5. There are no other areas of abnormal uptake.  This mass is intimately associated with blood vessels. I reviewed the CT scan with the interventional radiologist and a transcutaneous biopsy would be contraindicated due to high risk of bleeding.  The patient needs a surgical consultation to consider laparoscopy or laparotomy and surgical resection. I will be out of town until December 31. I left a message with Dr. Carman Ching to see if his office could help expedite a referral to one of our oncologic surgeons.  Patient's wife seem to have a good understanding of our discussion. I told her that scans are still not diagnostic for malignancy especially if there is chronic inflammation but the radiologist could not definitively exclude the possibility of malignancy based on this study. Further evaluation indicated.

## 2012-09-09 ENCOUNTER — Telehealth (INDEPENDENT_AMBULATORY_CARE_PROVIDER_SITE_OTHER): Payer: Self-pay

## 2012-09-09 ENCOUNTER — Encounter (INDEPENDENT_AMBULATORY_CARE_PROVIDER_SITE_OTHER): Payer: Self-pay | Admitting: General Surgery

## 2012-09-09 ENCOUNTER — Ambulatory Visit (INDEPENDENT_AMBULATORY_CARE_PROVIDER_SITE_OTHER): Payer: Medicare Other | Admitting: General Surgery

## 2012-09-09 VITALS — BP 134/72 | HR 75 | Temp 97.4°F | Resp 18 | Ht 63.0 in | Wt 126.0 lb

## 2012-09-09 DIAGNOSIS — K668 Other specified disorders of peritoneum: Secondary | ICD-10-CM | POA: Diagnosis not present

## 2012-09-09 DIAGNOSIS — K869 Disease of pancreas, unspecified: Secondary | ICD-10-CM | POA: Diagnosis not present

## 2012-09-09 DIAGNOSIS — K8689 Other specified diseases of pancreas: Secondary | ICD-10-CM

## 2012-09-09 NOTE — Telephone Encounter (Signed)
Pt has CT scan scheduled for 10/31/12 at [facility].  Labs to be drawn prior to scan.

## 2012-09-09 NOTE — Patient Instructions (Addendum)
CT scan in 6 weeks  I will discuss you at conference.  I will let you know if we have any other recommendations.

## 2012-09-11 ENCOUNTER — Telehealth (INDEPENDENT_AMBULATORY_CARE_PROVIDER_SITE_OTHER): Payer: Self-pay | Admitting: General Surgery

## 2012-09-11 DIAGNOSIS — K668 Other specified disorders of peritoneum: Secondary | ICD-10-CM

## 2012-09-11 NOTE — Telephone Encounter (Signed)
Discussed GI conference discussion with pt.   Will set up octreotide scan.

## 2012-09-11 NOTE — Telephone Encounter (Signed)
Message copied by Almond Lint on Wed Sep 11, 2012  2:03 PM ------      Message from: Cira Rue      Created: Tue Sep 10, 2012  9:34 AM       Sure will.      Thanks,      Almira Coaster      ----- Message -----         From: Almond Lint, MD         Sent: 09/09/2012   5:48 PM           To: Cira Rue, RN, Anderson [place] Lawrence            pls place on GI conference this week if possible.            tx      FB

## 2012-09-11 NOTE — Assessment & Plan Note (Signed)
I had long discussion with patient and his wife.   Pt has aortic stenosis that will need to be repaired in the next few years.  He is asymptomatic now, but given the mass, he will likely become symptomatic.    The mass is unresectable and not approachable for perc biopsy.  Will get octreotide scan.  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  It would not make sense to undertake significant risk with open biopsy in order to get a diagnosis with no good treatment.   However, at some point, he may develop vascular compromise of the bowel wall and require exploratory laparotomy to resect.  In that case, a biopsy could be undertaken.  If the scan is positive, would do observation only.    If scan is negative, will pursue follow up imaging to assess growth.    45 min spent in consultation.

## 2012-09-12 NOTE — Progress Notes (Signed)
Chief Complaint  Patient presents with  . New Evaluation    eval colon mass    HISTORY: Patient is a 72 year old may all who was in his normal state of helped when he had a screening colonoscopy in the area of inflammation was found in the transverse colon. This was biopsied that was negative for malignancy. He then underwent a CT scan to better evaluate this region. This was positive and demonstrated a calcified mesenteric mass that surrounds the SMA. He does have any pain or rectal bleeding. He has no obstructive symptoms. He is 71 and has aortic stenosis. 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 He is not excited about undergoing a risky biopsy if it will give answers that do not allow any good potential treatment.  Past Medical History  Diagnosis Date  . Aortic stenosis     moderate. Echo 3/10: EF 55-60%. mean gradient 25 mmHg. AVA 1.23cm2. Echo 3/11: Normal EF. mean AVA gradient . exercise myoview 2008: positive ECG. normal perfusion  . Exposure to secondhand smoke     extensive exposure  . Hyperlipidemia     treated  . Depression   . Asthma   . DM2 (diabetes mellitus, type 2)   . Heart murmur   . Testicular cancer     s/p XRT and surgical resection.  . Chronic lymphocytic leukemia     followed by Dr. Candyce Churn    Past Surgical History  Procedure Date  . Xrt and surgical resection     s/p. 10 years ago  . Stapendectomy 07/30/02    right    Current Outpatient Prescriptions  Medication Sig Dispense Refill  . acetaminophen (TYLENOL) 500 MG tablet Take 500 mg by mouth 3 (three) times daily.        Marland Kitchen aspirin 81 MG tablet Take 81 mg by mouth daily.        Marland Kitchen atorvastatin (LIPITOR) 80 MG tablet Take 40 mg by mouth daily.        . busPIRone (BUSPAR) 15 MG tablet Take 15 mg by mouth 3 (three) times daily. 1 tab in am, 2 tab at noon       . Cholestyramine POWD by Does not apply route daily.        . cycloSPORINE (RESTASIS) 0.05 % ophthalmic  emulsion 1 drop 2 (two) times daily.        Marland Kitchen desloratadine (CLARINEX) 5 MG tablet Take 5 mg by mouth daily.        . fenofibrate 160 MG tablet Take 160 mg by mouth daily.        . fluticasone (FLONASE) 50 MCG/ACT nasal spray Place 2 sprays into the nose daily.        Marland Kitchen FORA V12 BLOOD GLUCOSE TEST test strip       . gabapentin (NEURONTIN) 300 MG capsule Take 300 mg by mouth 2 (two) times daily. 1 in am, 2 at noon       . LITETOUCH LANCETS MISC       . Multiple Vitamin (MULTIVITAMIN) tablet Take 1 tablet by mouth daily.        Marland Kitchen PROCTOZONE-HC 2.5 % rectal cream       . sertraline (ZOLOFT) 100 MG tablet Take 100 mg by mouth daily.        . traZODone (DESYREL) 50 MG tablet Take 150 mg by mouth daily.           Allergies  Allergen Reactions  .  Penicillins Rash     Family History  Problem Relation Age of Onset  . Cancer Father     lung     History   Social History  . Marital Status: Married    Spouse Name: N/A    Number of Children: N/A  . Years of Education: N/A   Social History Main Topics  . Smoking status: Never Smoker   . Smokeless tobacco: None  . Alcohol Use: No  . Drug Use: No  . Sexually Active: None   Other Topics Concern  . None   Social History Narrative   Married and has 4 children. Has significant exposure to 2nd hand smoke. Works as a Actor.      REVIEW OF SYSTEMS - PERTINENT POSITIVES ONLY: 12 point review of systems negative other than HPI and PMH except for diarrhea and hearing loss  EXAM: Filed Vitals:   09/09/12 0902  BP: 134/72  Pulse: 75  Temp: 97.4 F (36.3 C)  Resp: 18    Gen:  No acute distress.  Well nourished and well groomed.   Neurological: Alert and oriented to person, place, and time. Coordination normal.  Head: Normocephalic and atraumatic.  Eyes: Conjunctivae are normal. Pupils are equal, round, and reactive to light. No scleral icterus.  Neck: Normal range of motion. Neck supple. No tracheal deviation  or thyromegaly present.  Cardiovascular: Normal rate, regular rhythm, normal heart sounds and intact distal pulses.  Exam reveals no gallop and no friction rub.  No murmur heard. Respiratory: Effort normal.  No respiratory distress. No chest wall tenderness. Breath sounds normal.  No wheezes, rales or rhonchi.  GI: Soft. Bowel sounds are normal. The abdomen is soft and nontender.  There is no rebound and no guarding.  Small umbilical hernia.    Musculoskeletal: Normal range of motion. Extremities are nontender.  Lymphadenopathy: No cervical, preauricular, postauricular or axillary adenopathy is present Skin: Skin is warm and dry. No rash noted. No diaphoresis. No erythema. No pallor. No clubbing, cyanosis, or edema.   Psychiatric: Normal mood and affect. Behavior is normal. Judgment and thought content normal.    LABORATORY RESULTS: Available labs are reviewed    RADIOLOGY RESULTS: See E-Chart or I-Site for most recent results.  Images and reports are reviewed.  CT scan abd pel IMPRESSION:  1. Irregular calcified mass in the root of the mesentery surrounds  the superior mesenteric artery and its branches, and occludes the  lower portion of the superior mesenteric vein. Differential  diagnostic considerations may include carcinoid tumor, leukemic  mass, gastrointestinal stromal tumor, partially treated or  recurrent testicular cancer, or less likely inflammatory  pseudotumor. Tissue diagnosis and/or PET CT recommended.  2. Mild ascites.  3. Nonspecific arterial phase enhancing mass medially in the  spleen, suspicious for malignancy, with hemangioma less likely.  4. Nonobstructive right nephrolithiasis.  5. Mild diffuse small bowel wall thickening may be secondary to  the venous congestion related to the mass.  6. Nonspecific small hypodense lesion in segment four of the  liver.  7. A large prostate gland.  8. Irregular wall thickening with a trabecular pattern, query  bladder  outlet obstruction.  9. Probable bone infarct in the right femoral neck, no change form  2007.  PET scan IMPRESSION:  1. Calcified central mesenteric mass has a low grade but abnormal  hypermetabolic activity compatible with malignancy. No additional  definite foci observed. Tissue sampling likely warranted although  the variety of SMA branches  extending within and through the lesion  raise risk for potential bleeding complication.  2. 1.1 cm short axis AP window lymph node is not hypermetabolic,  and accordingly likely benign.  3. Dense aortic valve calcification.  4. Small hiatal hernia.  5. Right nephrolithiasis.  6. Sigmoid diverticulosis.  7. Small umbilical hernia contains adipose tissue.  8. Urinary bladder wall thickening could reflect cystitis but it  is probably partially due to nondistension.  9. Chronic maxillary sinusitis   ASSESSMENT AND PLAN: Calcified mesenteric mass I had long discussion with patient and his wife.   Pt has aortic stenosis that will need to be repaired in the next few years.  He is asymptomatic now, but given the mass, he will likely become symptomatic.    The mass is unresectable and not approachable for perc biopsy.  Will get octreotide scan.  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  It would not make sense to undertake significant risk with open biopsy in order to get a diagnosis with no good treatment.   However, at some point, he may develop vascular compromise of the bowel wall and require exploratory laparotomy to resect.  In that case, a biopsy could be undertaken.  If the scan is positive, would do observation only.    If scan is negative, will pursue follow up imaging to assess growth.    45 min spent in consultation.       Maudry Diego MD Surgical Oncology, General and Endocrine Surgery [facility]      Visit Diagnoses: 1. Pancreatic mass     2. Calcified mesenteric mass     Primary Care Physician: Emeterio Reeve, MD

## 2012-09-13 ENCOUNTER — Telehealth (INDEPENDENT_AMBULATORY_CARE_PROVIDER_SITE_OTHER): Payer: Self-pay

## 2012-09-13 NOTE — Telephone Encounter (Signed)
Pt and his family have decided to seek a second opinion from Dr. Lattie Corns, Oncologist at [facility].  I was asked to fax all records to [place] at [facility].  Pt will still have his Octreotide scan on 09/17/12.  I told him we would fax that as well.

## 2012-09-13 NOTE — Telephone Encounter (Signed)
Spoke with the patient and gave him scheduled date for Octreotide Spect. Scan on 09/17/12.  Pt understands all instructions.

## 2012-09-17 ENCOUNTER — Encounter (HOSPITAL_COMMUNITY)
Admission: RE | Admit: 2012-09-17 | Discharge: 2012-09-17 | Disposition: A | Discharge status: Home / Self Care | Payer: Medicare Other | Source: Ambulatory Visit | Attending: General Surgery | Admitting: General Surgery

## 2012-09-17 DIAGNOSIS — D497 Neoplasm of unspecified behavior of endocrine glands and other parts of nervous system: Secondary | ICD-10-CM | POA: Diagnosis not present

## 2012-09-17 DIAGNOSIS — K668 Other specified disorders of peritoneum: Secondary | ICD-10-CM | POA: Insufficient documentation

## 2012-09-17 MED ORDER — INDIUM IN-111 PENTETREOTIDE IV KIT
6.0000 | PACK | Freq: Once | INTRAVENOUS | Status: AC | PRN
  Administered 2012-09-17: 6 via INTRAVENOUS

## 2012-09-18 ENCOUNTER — Encounter (HOSPITAL_COMMUNITY)
Admission: RE | Admit: 2012-09-18 | Discharge: 2012-09-18 | Disposition: A | Discharge status: Home / Self Care | Payer: Medicare Other | Source: Ambulatory Visit | Attending: General Surgery | Admitting: General Surgery

## 2012-09-18 DIAGNOSIS — K668 Other specified disorders of peritoneum: Secondary | ICD-10-CM | POA: Diagnosis not present

## 2012-09-20 ENCOUNTER — Telehealth (INDEPENDENT_AMBULATORY_CARE_PROVIDER_SITE_OTHER): Payer: Self-pay

## 2012-09-20 ENCOUNTER — Telehealth (INDEPENDENT_AMBULATORY_CARE_PROVIDER_SITE_OTHER): Payer: Self-pay | Admitting: General Surgery

## 2012-09-20 NOTE — Telephone Encounter (Signed)
Spoke with the patient again regarding his scan results.  He would like Dr. Donell Beers to call him.  He has more questions.  I told him I would let her know, but it may not be until early next week.

## 2012-09-20 NOTE — Telephone Encounter (Signed)
Patient called in requesting test results from Dr. Donell Beers. I advised the patient that she is in the office today seeing patients and I cannot confirm if she has reviewed the results yet, and I cannot release the information without her seeing it first. I advised the patient that I would give Dr. Donell Beers his message and he will be called back with the results as quickly as possible due to her working in clinic today. The patient agreed to call back on [phone].

## 2012-09-23 ENCOUNTER — Telehealth: Payer: Self-pay | Admitting: *Deleted

## 2012-09-23 NOTE — Telephone Encounter (Signed)
Dtr. Toney Reil Patier called [phone].  Her dad has been referred by Dr. Randa Evens to [facility]/ Dr. Lattie Corns for a neuroendocrine tumor.  His appt. There is on 10/02/12.  She is asking if there is any other lab work that he needs to have done before he goes.  Let her know that we do not have a ROI form in her dad's chart for [place] to be able to have a conversation about him.  However, in general it would be fine to call [facility] and ask.  While they may need additional lab work, they may prefer to have it done on site and not use an outside lab.  She understands this.  I will let Dr. Cyndie Chime know that she called and about the pt's referral for a neuroendocrine tumor to Dr. Lattie Corns @[facility]  on 10/02/12.   She appreciated that and the next time she comes with her Dad, he will sign ROI forms.

## 2012-09-23 NOTE — Telephone Encounter (Signed)
Per Dr. Cyndie Chime:  Spoke with patient.  Let him know that Dr. Lattie Corns is very good.  [facility] will likely want to get all their own lab and without a BX diagnosis we would have a difficult time with Medicare ordering special lab tests.   Pt. Understood and thought the same thing.  He appreciated the call.

## 2012-09-24 ENCOUNTER — Telehealth (INDEPENDENT_AMBULATORY_CARE_PROVIDER_SITE_OTHER): Payer: Self-pay | Admitting: General Surgery

## 2012-09-24 NOTE — Telephone Encounter (Signed)
Discussed with patient's daughter.    Discussed diagnosis and plan.  They have appt with [facility].

## 2012-10-02 DIAGNOSIS — D3A00 Benign carcinoid tumor of unspecified site: Secondary | ICD-10-CM | POA: Diagnosis not present

## 2012-10-15 DIAGNOSIS — F39 Unspecified mood [affective] disorder: Secondary | ICD-10-CM | POA: Diagnosis not present

## 2012-10-15 DIAGNOSIS — Z01818 Encounter for other preprocedural examination: Secondary | ICD-10-CM | POA: Diagnosis not present

## 2012-10-15 DIAGNOSIS — N400 Enlarged prostate without lower urinary tract symptoms: Secondary | ICD-10-CM | POA: Diagnosis not present

## 2012-10-15 DIAGNOSIS — M5030 Other cervical disc degeneration, unspecified cervical region: Secondary | ICD-10-CM | POA: Diagnosis not present

## 2012-10-15 DIAGNOSIS — I359 Nonrheumatic aortic valve disorder, unspecified: Secondary | ICD-10-CM | POA: Diagnosis not present

## 2012-10-15 DIAGNOSIS — E785 Hyperlipidemia, unspecified: Secondary | ICD-10-CM | POA: Diagnosis not present

## 2012-10-15 DIAGNOSIS — N189 Chronic kidney disease, unspecified: Secondary | ICD-10-CM | POA: Diagnosis not present

## 2012-10-15 DIAGNOSIS — J45909 Unspecified asthma, uncomplicated: Secondary | ICD-10-CM | POA: Diagnosis not present

## 2012-10-15 DIAGNOSIS — K5732 Diverticulitis of large intestine without perforation or abscess without bleeding: Secondary | ICD-10-CM | POA: Diagnosis not present

## 2012-10-15 DIAGNOSIS — C801 Malignant (primary) neoplasm, unspecified: Secondary | ICD-10-CM | POA: Diagnosis not present

## 2012-10-15 DIAGNOSIS — E119 Type 2 diabetes mellitus without complications: Secondary | ICD-10-CM | POA: Diagnosis not present

## 2012-10-17 DIAGNOSIS — Z79899 Other long term (current) drug therapy: Secondary | ICD-10-CM | POA: Diagnosis not present

## 2012-10-17 DIAGNOSIS — R1013 Epigastric pain: Secondary | ICD-10-CM | POA: Diagnosis not present

## 2012-10-17 DIAGNOSIS — R933 Abnormal findings on diagnostic imaging of other parts of digestive tract: Secondary | ICD-10-CM | POA: Diagnosis not present

## 2012-10-17 DIAGNOSIS — Q391 Atresia of esophagus with tracheo-esophageal fistula: Secondary | ICD-10-CM | POA: Diagnosis not present

## 2012-10-17 DIAGNOSIS — K222 Esophageal obstruction: Secondary | ICD-10-CM | POA: Diagnosis not present

## 2012-10-23 ENCOUNTER — Telehealth (INDEPENDENT_AMBULATORY_CARE_PROVIDER_SITE_OTHER): Payer: Self-pay

## 2012-10-23 NOTE — Telephone Encounter (Signed)
Call and spoke to pt's wife to remind him of CT scan scheduled for 10/31/12 at [facility].

## 2012-10-24 DIAGNOSIS — K668 Other specified disorders of peritoneum: Secondary | ICD-10-CM | POA: Diagnosis not present

## 2012-10-24 DIAGNOSIS — Z79899 Other long term (current) drug therapy: Secondary | ICD-10-CM | POA: Diagnosis not present

## 2012-10-24 DIAGNOSIS — R935 Abnormal findings on diagnostic imaging of other abdominal regions, including retroperitoneum: Secondary | ICD-10-CM | POA: Diagnosis not present

## 2012-10-24 DIAGNOSIS — R933 Abnormal findings on diagnostic imaging of other parts of digestive tract: Secondary | ICD-10-CM | POA: Diagnosis not present

## 2012-10-24 DIAGNOSIS — K319 Disease of stomach and duodenum, unspecified: Secondary | ICD-10-CM | POA: Diagnosis not present

## 2012-10-24 DIAGNOSIS — K2970 Gastritis, unspecified, without bleeding: Secondary | ICD-10-CM | POA: Diagnosis not present

## 2012-10-24 DIAGNOSIS — I899 Noninfective disorder of lymphatic vessels and lymph nodes, unspecified: Secondary | ICD-10-CM | POA: Diagnosis not present

## 2012-10-31 ENCOUNTER — Ambulatory Visit
Admission: RE | Admit: 2012-10-31 | Discharge: 2012-10-31 | Disposition: A | Discharge status: Home / Self Care | Payer: Medicare Other | Source: Ambulatory Visit | Attending: General Surgery | Admitting: General Surgery

## 2012-10-31 DIAGNOSIS — R188 Other ascites: Secondary | ICD-10-CM | POA: Diagnosis not present

## 2012-10-31 DIAGNOSIS — R1909 Other intra-abdominal and pelvic swelling, mass and lump: Secondary | ICD-10-CM | POA: Diagnosis not present

## 2012-10-31 DIAGNOSIS — K8689 Other specified diseases of pancreas: Secondary | ICD-10-CM

## 2012-10-31 DIAGNOSIS — K869 Disease of pancreas, unspecified: Secondary | ICD-10-CM | POA: Diagnosis not present

## 2012-10-31 MED ORDER — IOHEXOL 300 MG/ML  SOLN
100.0000 mL | Freq: Once | INTRAMUSCULAR | Status: AC | PRN
  Administered 2012-10-31: 100 mL via INTRAVENOUS

## 2012-11-06 DIAGNOSIS — K668 Other specified disorders of peritoneum: Secondary | ICD-10-CM | POA: Diagnosis not present

## 2012-11-15 ENCOUNTER — Other Ambulatory Visit: Payer: Self-pay | Admitting: Internal Medicine

## 2012-11-15 DIAGNOSIS — C7A098 Malignant carcinoid tumors of other sites: Secondary | ICD-10-CM

## 2012-12-12 ENCOUNTER — Telehealth (HOSPITAL_COMMUNITY): Payer: Self-pay | Admitting: Cardiology

## 2012-12-12 DIAGNOSIS — I359 Nonrheumatic aortic valve disorder, unspecified: Secondary | ICD-10-CM

## 2012-12-12 NOTE — Telephone Encounter (Signed)
Order placed for upcoming echo

## 2012-12-16 ENCOUNTER — Encounter (HOSPITAL_COMMUNITY): Payer: Self-pay | Admitting: Physician Assistant

## 2012-12-16 ENCOUNTER — Encounter (HOSPITAL_COMMUNITY): Payer: Self-pay

## 2012-12-16 ENCOUNTER — Ambulatory Visit (HOSPITAL_BASED_OUTPATIENT_CLINIC_OR_DEPARTMENT_OTHER)
Admission: RE | Admit: 2012-12-16 | Discharge: 2012-12-16 | Disposition: A | Discharge status: Home / Self Care | Payer: Medicare Other | Source: Ambulatory Visit | Attending: Internal Medicine | Admitting: Internal Medicine

## 2012-12-16 ENCOUNTER — Encounter (HOSPITAL_COMMUNITY): Payer: Self-pay | Admitting: *Deleted

## 2012-12-16 ENCOUNTER — Ambulatory Visit (HOSPITAL_COMMUNITY)
Admission: RE | Admit: 2012-12-16 | Discharge: 2012-12-16 | Disposition: A | Discharge status: Home / Self Care | Payer: Medicare Other | Source: Ambulatory Visit | Attending: Internal Medicine | Admitting: Internal Medicine

## 2012-12-16 VITALS — BP 136/82 | HR 70 | Wt 126.0 lb

## 2012-12-16 DIAGNOSIS — F329 Major depressive disorder, single episode, unspecified: Secondary | ICD-10-CM | POA: Insufficient documentation

## 2012-12-16 DIAGNOSIS — Z8547 Personal history of malignant neoplasm of testis: Secondary | ICD-10-CM | POA: Diagnosis not present

## 2012-12-16 DIAGNOSIS — E785 Hyperlipidemia, unspecified: Secondary | ICD-10-CM | POA: Diagnosis not present

## 2012-12-16 DIAGNOSIS — Z7982 Long term (current) use of aspirin: Secondary | ICD-10-CM | POA: Insufficient documentation

## 2012-12-16 DIAGNOSIS — R0609 Other forms of dyspnea: Secondary | ICD-10-CM | POA: Diagnosis not present

## 2012-12-16 DIAGNOSIS — H9190 Unspecified hearing loss, unspecified ear: Secondary | ICD-10-CM | POA: Insufficient documentation

## 2012-12-16 DIAGNOSIS — R0989 Other specified symptoms and signs involving the circulatory and respiratory systems: Secondary | ICD-10-CM | POA: Insufficient documentation

## 2012-12-16 DIAGNOSIS — I359 Nonrheumatic aortic valve disorder, unspecified: Secondary | ICD-10-CM

## 2012-12-16 DIAGNOSIS — F3289 Other specified depressive episodes: Secondary | ICD-10-CM | POA: Diagnosis not present

## 2012-12-16 DIAGNOSIS — E119 Type 2 diabetes mellitus without complications: Secondary | ICD-10-CM | POA: Insufficient documentation

## 2012-12-16 DIAGNOSIS — Z79899 Other long term (current) drug therapy: Secondary | ICD-10-CM | POA: Insufficient documentation

## 2012-12-16 DIAGNOSIS — C9110 Chronic lymphocytic leukemia of B-cell type not having achieved remission: Secondary | ICD-10-CM | POA: Insufficient documentation

## 2012-12-16 LAB — CBC
Hemoglobin: 13.3 g/dL (ref 13.0–17.0)
MCHC: 34.5 g/dL (ref 30.0–36.0)
RDW: 12.4 % (ref 11.5–15.5)

## 2012-12-16 LAB — BASIC METABOLIC PANEL
BUN: 18 mg/dL (ref 6–23)
Creatinine, Ser: 1.01 mg/dL (ref 0.50–1.35)
GFR calc Af Amer: 83 mL/min — ABNORMAL LOW (ref >90)
GFR calc non Af Amer: 72 mL/min — ABNORMAL LOW (ref >90)
Potassium: 4.7 mEq/L (ref 3.5–5.1)

## 2012-12-16 LAB — PROTIME-INR
INR: 1.03 (ref 0.00–1.49)
Prothrombin Time: 13.4 seconds (ref 11.6–15.2)

## 2012-12-16 MED ORDER — FUROSEMIDE 20 MG PO TABS: 20.0000 mg | ORAL_TABLET | Freq: Two times a day (BID) | ORAL | Status: DC

## 2012-12-16 MED ORDER — POTASSIUM CHLORIDE CRYS ER 20 MEQ PO TBCR: 20.0000 meq | EXTENDED_RELEASE_TABLET | Freq: Every day | ORAL | Status: DC

## 2012-12-16 NOTE — Assessment & Plan Note (Signed)
Echo reviewed personally. His AS is now in the moderate to severe range and has become symptomatic. I discussed with him and his daughter that he is nearing time for AVR. We discussed TAVR vs open AVR and I think open is likely best route for now. I contacted Dr. Lattie Corns at [facility] to discuss situation and my concern over neuroendocrine crisis/cardcinoid syndrome with general anesthesia. He felt he was OK to go to OR but recommended octreotide drip during surgery and octreotide injections 200 q8 while in house postoperatively. Will arrange pre-op R and L heart cath. He would like to defer surgery until June due to family obligations. We will start low-dose lasix 20 daily and kcl 20 daily. Can cut back on lasix if develops presyncope.

## 2012-12-16 NOTE — Patient Instructions (Signed)
Add lasix 20 mg and potassium 20 mEq daily.  Call if you become dizzy.    Will set you up for heart catheterization for possible aortic valve repair.

## 2012-12-16 NOTE — Progress Notes (Signed)
HPI:  [name] is 72 y/o male with h/o moderate AS, DM2 and hyperlipidemia.  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  Echo in March 2012 showed stable aortic stensosis in the moderate range (mean 25mm HG was 23 in 3/11) with normal LV function.+ pseudonormal filling  Echo 08/2011 EF normal AS mean gradient up to 35.   Echo today: EF normal but distal septum hypokinetic.  AS mean gradient up to 39. AVA 0.7  He returns for follow up today with his daughter, [name].  He is feeling ok.  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 Morse.  In June will have follow up CT.  If tumor progressing will start injections, ocreotide.  He says he has noted more dyspnea with steps and hills.   He is not very active. No syncope.  Occ dizziness with standing.  +lower extremity edema.  No chest pain.  Continues to work at pharmacy.  ROS: All systems negative except as listed in HPI, PMH and Problem List.  Past Medical History  Diagnosis Date  . Aortic stenosis     moderate. Echo 3/10: EF 55-60%. mean gradient 25 mmHg. AVA 1.23cm2. Echo 3/11: Normal EF. mean AVA gradient 23mmHg. exercise myoview 2008: positive ECG. normal perfusion  . Exposure to secondhand smoke     extensive exposure  . Hyperlipidemia     treated  . Depression   . Asthma   . DM2 (diabetes mellitus, type 2)   . Heart murmur   . Testicular cancer     s/p XRT and surgical resection.  . Chronic lymphocytic leukemia     followed by Dr. Granfortun    Current Outpatient Prescriptions  Medication Sig Dispense Refill  . acetaminophen (TYLENOL) 500 MG tablet Take 500 mg by mouth 3 (three) times daily.        . aspirin 81 MG tablet Take 81 mg by mouth daily.        . atorvastatin (LIPITOR) 40 MG tablet Take 40 mg by mouth daily.      . busPIRone (BUSPAR) 15 MG tablet Take 15 mg by mouth 3 (three) times daily. 1 tab in am, 2 tab at  noon       . Cholestyramine POWD by Does not apply route daily.        . cycloSPORINE (RESTASIS) 0.05 % ophthalmic emulsion 1 drop 2 (two) times daily.        . desloratadine (CLARINEX) 5 MG tablet Take 5 mg by mouth daily.        . fenofibrate 160 MG tablet Take 160 mg by mouth daily.        . fluticasone (FLONASE) 50 MCG/ACT nasal spray Place 2 sprays into the nose daily.        . FORA V12 BLOOD GLUCOSE TEST test strip       . gabapentin (NEURONTIN) 300 MG capsule Take 300 mg by mouth 2 (two) times daily. 1 in am, 2 at noon       . LITETOUCH LANCETS MISC       . Multiple Vitamin (MULTIVITAMIN) tablet Take 1 tablet by mouth daily.        . PROCTOZONE-HC 2.5 % rectal cream       . sertraline (ZOLOFT) 100 MG tablet Take 100 mg by mouth daily.        . traZODone (DESYREL) 50 MG tablet Take 150 mg   by mouth daily.        . furosemide (LASIX) 20 MG tablet Take 1 tablet (20 mg total) by mouth 2 (two) times daily.  30 tablet  6  . potassium chloride SA (K-DUR,KLOR-CON) 20 MEQ tablet Take 1 tablet (20 mEq total) by mouth daily.  30 tablet  6   No current facility-administered medications for this encounter.     PHYSICAL EXAM: Filed Vitals:   12/16/12 0907  BP: 136/82  Pulse: 70  Weight: 126 lb (57.153 kg)  SpO2: 98%    General:  well appearing. no resp difficulty HEENT: normal Neck: supple. JVP 6-7. Carotids mildly delayed with bilat radiated bruits.  Cor: PMI nondisplaced. Regular rate & rhythm. No rubs, gallops. 3/6 mid-peaking AS murmur. S2 decreased but audible Lungs: clear Abdomen: soft, nontender, nondistended. No hepatosplenomegaly. No bruits or masses. Good bowel sounds. Extremities: no cyanosis, clubbing, rash, tr-1+ ankle edema Neuro: alert & orientedx3, cranial nerves grossly intact. moves all 4 extremities w/o difficulty. affect pleasant      ASSESSMENT & PLAN:  

## 2012-12-16 NOTE — Progress Notes (Signed)
  Echocardiogram 2D Echocardiogram has been performed.  Pericles Carmicheal, [facility] 12/16/2012, 9:06 AM

## 2012-12-18 ENCOUNTER — Encounter (HOSPITAL_COMMUNITY): Payer: Self-pay

## 2012-12-25 ENCOUNTER — Observation Stay (HOSPITAL_COMMUNITY)
Admission: RE | Admit: 2012-12-25 | Discharge: 2012-12-26 | Disposition: A | Discharge status: Home / Self Care | Payer: Medicare Other | Source: Ambulatory Visit | Attending: Internal Medicine | Admitting: Internal Medicine

## 2012-12-25 ENCOUNTER — Encounter (HOSPITAL_COMMUNITY): Payer: Self-pay | Admitting: *Deleted

## 2012-12-25 ENCOUNTER — Encounter (HOSPITAL_COMMUNITY)
Admission: RE | Disposition: A | Discharge status: Home / Self Care | Payer: Self-pay | Source: Ambulatory Visit | Attending: Internal Medicine

## 2012-12-25 DIAGNOSIS — D3A8 Other benign neuroendocrine tumors: Secondary | ICD-10-CM

## 2012-12-25 DIAGNOSIS — E119 Type 2 diabetes mellitus without complications: Secondary | ICD-10-CM | POA: Diagnosis not present

## 2012-12-25 DIAGNOSIS — E785 Hyperlipidemia, unspecified: Secondary | ICD-10-CM | POA: Diagnosis not present

## 2012-12-25 DIAGNOSIS — IMO0002 Reserved for concepts with insufficient information to code with codable children: Secondary | ICD-10-CM | POA: Diagnosis not present

## 2012-12-25 DIAGNOSIS — C9110 Chronic lymphocytic leukemia of B-cell type not having achieved remission: Secondary | ICD-10-CM | POA: Insufficient documentation

## 2012-12-25 DIAGNOSIS — Q245 Malformation of coronary vessels: Secondary | ICD-10-CM | POA: Diagnosis not present

## 2012-12-25 DIAGNOSIS — I2510 Atherosclerotic heart disease of native coronary artery without angina pectoris: Secondary | ICD-10-CM

## 2012-12-25 DIAGNOSIS — I359 Nonrheumatic aortic valve disorder, unspecified: Secondary | ICD-10-CM | POA: Diagnosis not present

## 2012-12-25 DIAGNOSIS — D3A098 Benign carcinoid tumors of other sites: Secondary | ICD-10-CM | POA: Diagnosis not present

## 2012-12-25 DIAGNOSIS — S301XXA Contusion of abdominal wall, initial encounter: Secondary | ICD-10-CM

## 2012-12-25 DIAGNOSIS — I350 Nonrheumatic aortic (valve) stenosis: Secondary | ICD-10-CM

## 2012-12-25 DIAGNOSIS — Y840 Cardiac catheterization as the cause of abnormal reaction of the patient, or of later complication, without mention of misadventure at the time of the procedure: Secondary | ICD-10-CM | POA: Insufficient documentation

## 2012-12-25 HISTORY — DX: Left bundle-branch block, unspecified: I44.7

## 2012-12-25 HISTORY — PX: LEFT AND RIGHT HEART CATHETERIZATION WITH CORONARY ANGIOGRAM: SHX5449

## 2012-12-25 LAB — POCT I-STAT 3, VENOUS BLOOD GAS (G3P V)
Acid-base deficit: 2 mmol/L (ref 0.0–2.0)
Bicarbonate: 23.4 mEq/L (ref 20.0–24.0)
Bicarbonate: 24.1 mEq/L — ABNORMAL HIGH (ref 20.0–24.0)
TCO2: 25 mmol/L (ref 0–100)
pH, Ven: 7.298 (ref 7.250–7.300)
pH, Ven: 7.322 — ABNORMAL HIGH (ref 7.250–7.300)

## 2012-12-25 LAB — POCT I-STAT 3, ART BLOOD GAS (G3+)
Bicarbonate: 25.3 mEq/L — ABNORMAL HIGH (ref 20.0–24.0)
O2 Saturation: 95 %

## 2012-12-25 SURGERY — LEFT AND RIGHT HEART CATHETERIZATION WITH CORONARY ANGIOGRAM
Anesthesia: LOCAL

## 2012-12-25 MED ORDER — HEPARIN (PORCINE) IN NACL 2-0.9 UNIT/ML-% IJ SOLN
INTRAMUSCULAR | Status: AC
  Filled 2012-12-25: qty 1000

## 2012-12-25 MED ORDER — SODIUM CHLORIDE 0.9 % IJ SOLN: 3.0000 mL | Freq: Two times a day (BID) | INTRAMUSCULAR | Status: DC

## 2012-12-25 MED ORDER — ACETAMINOPHEN 500 MG PO TABS
500.0000 mg | ORAL_TABLET | Freq: Three times a day (TID) | ORAL | Status: DC
  Administered 2012-12-25: 500 mg via ORAL
  Filled 2012-12-25 (×4): qty 1

## 2012-12-25 MED ORDER — TRAZODONE HCL 50 MG PO TABS
50.0000 mg | ORAL_TABLET | Freq: Every day | ORAL | Status: DC
  Administered 2012-12-25: 50 mg via ORAL
  Filled 2012-12-25 (×2): qty 1

## 2012-12-25 MED ORDER — ACETAMINOPHEN 325 MG PO TABS
650.0000 mg | ORAL_TABLET | ORAL | Status: DC | PRN
  Filled 2012-12-25: qty 2

## 2012-12-25 MED ORDER — MIDAZOLAM HCL 2 MG/2ML IJ SOLN
INTRAMUSCULAR | Status: AC
  Filled 2012-12-25: qty 2

## 2012-12-25 MED ORDER — LIDOCAINE HCL (PF) 1 % IJ SOLN
INTRAMUSCULAR | Status: AC
  Filled 2012-12-25: qty 30

## 2012-12-25 MED ORDER — SODIUM CHLORIDE 0.9 % IV SOLN: 250.0000 mL | INTRAVENOUS | Status: DC | PRN

## 2012-12-25 MED ORDER — FENTANYL CITRATE 0.05 MG/ML IJ SOLN
INTRAMUSCULAR | Status: AC
  Filled 2012-12-25: qty 2

## 2012-12-25 MED ORDER — ONDANSETRON HCL 4 MG/2ML IJ SOLN: 4.0000 mg | Freq: Four times a day (QID) | INTRAMUSCULAR | Status: DC | PRN

## 2012-12-25 MED ORDER — TRAZODONE HCL 150 MG PO TABS
150.0000 mg | ORAL_TABLET | Freq: Every day | ORAL | Status: DC
  Filled 2012-12-25: qty 1

## 2012-12-25 MED ORDER — SODIUM CHLORIDE 0.9 % IJ SOLN: 3.0000 mL | INTRAMUSCULAR | Status: DC | PRN

## 2012-12-25 MED ORDER — SODIUM CHLORIDE 0.9 % IV SOLN
INTRAVENOUS | Status: AC
  Administered 2012-12-25: 17:00:00 via INTRAVENOUS

## 2012-12-25 MED ORDER — ATORVASTATIN CALCIUM 40 MG PO TABS
40.0000 mg | ORAL_TABLET | Freq: Every day | ORAL | Status: DC
  Administered 2012-12-25: 40 mg via ORAL
  Filled 2012-12-25 (×2): qty 1

## 2012-12-25 NOTE — CV Procedure (Signed)
Cardiac Cath Procedure Note  Indication: Aortic stenosis  Procedures performed:  1) Right heart cathererization 2) Selective coronary angiography 3) Left heart catheterization 4) Left ventriculogram  Description of procedure:     The risks and indication of the procedure were explained. Consent was signed and placed on the chart. An appropriate timeout was taken prior to the procedure. The right groin was prepped and draped in the routine sterile fashion and anesthetized with 1% local lidocaine.   A 6 FR arterial sheath was placed in the right femoral artery using a modified Seldinger technique. A JL4 and JR4 were used for the coronary angiography. We crossed the aortic valve with the JR4 and long straight wire. The JR4 was then passed across the valve and the long straight wire was exchanged for a long J wire. A Langston catheter was then placed across the valve into the LV over the wire. Simultaneous A0 and LV pressures were measured.  A 7 FR venous sheath was placed in the right femoral vein using a modified Seldinger technique. A standard Swan-Ganz catheter was used for the procedure.   Complications:  None apparent  Total contrast: 75 cc  Findings:  RA = 4 RV = 31/3/7 PA = 31/8 (18) PCW = 9 Fick cardiac output/index = 4.2/2.7 PVR = 1.0 Woods FA sat = 95% PA sat = 68%, 73%  Ao Pressure: 135/58 (89) LV Pressure:  166/8/16 Simultaneous LV-AO gradient: 31 mm HG (peak to peak) (mean) AVA 0.78cm2  Left main: Absent. Probable separate LAD and LCX ostia  LAD: Moderate sized vessel. Which bifurcated in the midsection into a large D2 and small distal LA. 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 There is a 70-80% stenosis in the proximal portion of the D1. The remainder of the LAD system is normal.   LCX: Small vessel. Gives off OM-1 and OM-2. Normal.  RCA: Large dominant vessel. With a  large PDA and 3 PLS. Normal  LV-gram done in the RAO projection: Ejection fraction = 60%. Nor regional wall motion abnormalities. The AoV is heavily calcified.  Assessment: 1. 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 Normal intracardiac pressures 3. Moderate to severe aortic stenosis  Plan/Discussion:  Will arrange for him to see TCTS for AVR and possible ligation of fistulous D1. Will order chest CTA to further evaluate anomalous vessel. Given neuroendocrine tumor will need somatostatin infusion during AVR and post-op.   Arvilla Meres, MD 4:23 PM

## 2012-12-25 NOTE — Progress Notes (Signed)
Tyler Whitlock,MD at bedside to evaluate pt. Orders received for admission to observation. Bed requested.

## 2012-12-25 NOTE — Progress Notes (Signed)
Bed assignment of 2019 received. Report given to receiving RN on 2000. Pt transported via bed without incident.

## 2012-12-25 NOTE — Progress Notes (Signed)
Pt walked in hallway without difficulty, upon inspection of right groin a 6cm hematoma noted. Pt back in bed, pressure applied to right groin. Alinda Money PA paged who informed me to page the fellow for the hospital. [facility] paged who is to come evaluate pt.

## 2012-12-25 NOTE — Progress Notes (Signed)
Daughter informed the nurse that the patient has a neuroendocrine tumor. Harmon Pier

## 2012-12-25 NOTE — Interval H&P Note (Signed)
History and Physical Interval Note:  12/25/2012 3:45 PM  [name]  has presented today for surgery, with the diagnosis of aortic stenosis  The various methods of treatment have been discussed with the patient and family. After consideration of risks, benefits and other options for treatment, the patient has consented to  Procedure(s): LEFT AND RIGHT HEART CATHETERIZATION WITH CORONARY ANGIOGRAM (N/A) as a surgical intervention .  The patient's history has been reviewed, patient examined, no change in status, stable for surgery.  I have reviewed the patient's chart and labs.  Questions were answered to the patient's satisfaction.     Daniel Bensimhon

## 2012-12-25 NOTE — H&P (View-Only) (Signed)
HPI:  [name] is 72 y/o male with h/o moderate AS, DM2 and hyperlipidemia.  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  Echo in March 2012 showed stable aortic stensosis in the moderate range (mean 25mm HG was 23 in 3/11) with normal LV function.+ pseudonormal filling  Echo 08/2011 EF normal AS mean gradient up to 35.   Echo today: EF normal but distal septum hypokinetic.  AS mean gradient up to 39. AVA 0.7  He returns for follow up today with his daughter, Kennyth Arnold.  He is feeling ok.  He has been diagnosed with possible neuroendocrine/carcinoid vs metastatic tumor (h/o testicular CA) unable to biopsy due to calcification, being followed at [facility] by Dr. Lattie Corns.  In June will have follow up CT.  If tumor progressing will start injections, ocreotide.  He says he has noted more dyspnea with steps and hills.   He is not very active. No syncope.  Occ dizziness with standing.  +lower extremity edema.  No chest pain.  Continues to work at pharmacy.  ROS: All systems negative except as listed in HPI, PMH and Problem List.  Past Medical History  Diagnosis Date  . Aortic stenosis     moderate. Echo 3/10: EF 55-60%. mean gradient 25 mmHg. AVA 1.23cm2. Echo 3/11: Normal EF. mean AVA gradient . exercise myoview 2008: positive ECG. normal perfusion  . Exposure to secondhand smoke     extensive exposure  . Hyperlipidemia     treated  . Depression   . Asthma   . DM2 (diabetes mellitus, type 2)   . Heart murmur   . Testicular cancer     s/p XRT and surgical resection.  . Chronic lymphocytic leukemia     followed by Dr. Candyce Churn    Current Outpatient Prescriptions  Medication Sig Dispense Refill  . acetaminophen (TYLENOL) 500 MG tablet Take 500 mg by mouth 3 (three) times daily.        Marland Kitchen aspirin 81 MG tablet Take 81 mg by mouth daily.        Marland Kitchen atorvastatin (LIPITOR) 40 MG tablet Take 40 mg by mouth daily.      . busPIRone (BUSPAR) 15 MG tablet Take 15 mg by mouth 3 (three) times daily. 1 tab in am, 2 tab at  noon       . Cholestyramine POWD by Does not apply route daily.        . cycloSPORINE (RESTASIS) 0.05 % ophthalmic emulsion 1 drop 2 (two) times daily.        Marland Kitchen desloratadine (CLARINEX) 5 MG tablet Take 5 mg by mouth daily.        . fenofibrate 160 MG tablet Take 160 mg by mouth daily.        . fluticasone (FLONASE) 50 MCG/ACT nasal spray Place 2 sprays into the nose daily.        Marland Kitchen FORA V12 BLOOD GLUCOSE TEST test strip       . gabapentin (NEURONTIN) 300 MG capsule Take 300 mg by mouth 2 (two) times daily. 1 in am, 2 at noon       . LITETOUCH LANCETS MISC       . Multiple Vitamin (MULTIVITAMIN) tablet Take 1 tablet by mouth daily.        Marland Kitchen PROCTOZONE-HC 2.5 % rectal cream       . sertraline (ZOLOFT) 100 MG tablet Take 100 mg by mouth daily.        . traZODone (DESYREL) 50 MG tablet Take 150 mg  by mouth daily.        . furosemide (LASIX) 20 MG tablet Take 1 tablet (20 mg total) by mouth 2 (two) times daily.  30 tablet  6  . potassium chloride SA (K-DUR,KLOR-CON) 20 MEQ tablet Take 1 tablet (20 mEq total) by mouth daily.  30 tablet  6   No current facility-administered medications for this encounter.     PHYSICAL EXAM: Filed Vitals:   12/16/12 0907  BP: 136/82  Pulse: 70  Weight: 126 lb (57.153 kg)  SpO2: 98%    General:  well appearing. no resp difficulty HEENT: normal Neck: supple. JVP 6-7. Carotids mildly delayed with bilat radiated bruits.  Cor: PMI nondisplaced. Regular rate & rhythm. No rubs, gallops. 3/6 mid-peaking AS murmur. S2 decreased but audible Lungs: clear Abdomen: soft, nontender, nondistended. No hepatosplenomegaly. No bruits or masses. Good bowel sounds. Extremities: no cyanosis, clubbing, rash, tr-1+ ankle edema Neuro: alert & orientedx3, cranial nerves grossly intact. moves all 4 extremities w/o difficulty. affect pleasant      ASSESSMENT & PLAN:

## 2012-12-26 ENCOUNTER — Encounter (HOSPITAL_COMMUNITY): Payer: Self-pay | Admitting: Nurse Practitioner

## 2012-12-26 DIAGNOSIS — IMO0002 Reserved for concepts with insufficient information to code with codable children: Secondary | ICD-10-CM | POA: Diagnosis not present

## 2012-12-26 DIAGNOSIS — S3012XA Contusion of groin, initial encounter: Secondary | ICD-10-CM

## 2012-12-26 DIAGNOSIS — E119 Type 2 diabetes mellitus without complications: Secondary | ICD-10-CM | POA: Insufficient documentation

## 2012-12-26 DIAGNOSIS — I350 Nonrheumatic aortic (valve) stenosis: Secondary | ICD-10-CM

## 2012-12-26 DIAGNOSIS — I359 Nonrheumatic aortic valve disorder, unspecified: Secondary | ICD-10-CM | POA: Diagnosis not present

## 2012-12-26 DIAGNOSIS — Q245 Malformation of coronary vessels: Secondary | ICD-10-CM | POA: Diagnosis not present

## 2012-12-26 DIAGNOSIS — S301XXA Contusion of abdominal wall, initial encounter: Secondary | ICD-10-CM

## 2012-12-26 DIAGNOSIS — D3A8 Other benign neuroendocrine tumors: Secondary | ICD-10-CM

## 2012-12-26 DIAGNOSIS — E785 Hyperlipidemia, unspecified: Secondary | ICD-10-CM | POA: Diagnosis not present

## 2012-12-26 DIAGNOSIS — D3A098 Benign carcinoid tumors of other sites: Secondary | ICD-10-CM | POA: Diagnosis not present

## 2012-12-26 DIAGNOSIS — C9110 Chronic lymphocytic leukemia of B-cell type not having achieved remission: Secondary | ICD-10-CM | POA: Diagnosis not present

## 2012-12-26 LAB — GLUCOSE, CAPILLARY
Glucose-Capillary: 92 mg/dL (ref 70–99)
Glucose-Capillary: 99 mg/dL (ref 70–99)

## 2012-12-26 NOTE — H&P (Signed)
History and Physical  Patient ID: [name] MRN: [id number], SOB: 08-16-1941 72 y.o. Date of Encounter: 12/26/2012, 4:34 AM  Primary Physician: Emeterio Reeve, MD Primary Cardiologist: Dr. Gala Romney  Chief Complaint: groin hematoma  HPI: 72 y.o. male w/ PMHx significant for aortic stenosis who presented to [facility] on 12/25/2012 for left heart cath in anticipation of surgery for his aortic stenosis. Per pt, uneventful cath and manual pressure for closure. After lying flat for 4 hrs, he got up to ambulate in anticipation of discharge. Upon returning to bed, the nurse noted a gold ball size hematoma in his R femoral region. Per nursing, held direct pressure for 10 minutes with good reduction and softening.  Was called to bedside and noted a nickel size firmness, non tender. No bruits.  Due to the time of the evening, decision was made to admit to obs and monitor and anticipate discharge in the AM.    Past Medical History  Diagnosis Date  . Aortic stenosis     moderate. Echo 3/10: EF 55-60%. mean gradient 25 mmHg. AVA 1.23cm2. Echo 3/11: Normal EF. mean AVA gradient . exercise myoview 2008: positive ECG. normal perfusion  . Exposure to secondhand smoke     extensive exposure  . Hyperlipidemia     treated  . Depression   . Asthma   . DM2 (diabetes mellitus, type 2)   . Heart murmur   . Testicular cancer     s/p XRT and surgical resection.  . Chronic lymphocytic leukemia     followed by Dr. Candyce Churn     Surgical History:  Past Surgical History  Procedure Laterality Date  . Xrt and surgical resection      s/p. 10 years ago  . Stapendectomy  07/30/02    right     Home Meds: Prior to Admission medications   Medication Sig Start Date End Date Taking? Authorizing Provider  acetaminophen (TYLENOL) 500 MG tablet Take 500 mg by mouth 3 (three) times daily.    Yes Historical Provider, MD  aspirin EC 81 MG tablet Take 81 mg by mouth daily.   Yes Historical  Provider, MD  atorvastatin (LIPITOR) 40 MG tablet Take 40 mg by mouth daily.   Yes Historical Provider, MD  busPIRone (BUSPAR) 15 MG tablet Take 15-30 mg by mouth 2 (two) times daily. One tablet every morning and two tablets every day at noon   Yes Historical Provider, MD  Cholestyramine POWD Take 1 scoop by mouth 2 (two) times daily.    Yes Historical Provider, MD  cycloSPORINE (RESTASIS) 0.05 % ophthalmic emulsion Place 1 drop into both eyes 2 (two) times daily.    Yes Historical Provider, MD  desloratadine (CLARINEX) 5 MG tablet Take 5 mg by mouth daily.     Yes Historical Provider, MD  fenofibrate 160 MG tablet Take 160 mg by mouth daily.     Yes Historical Provider, MD  fluticasone (FLONASE) 50 MCG/ACT nasal spray Place 2 sprays into the nose daily.    Yes Historical Provider, MD  FORA V12 BLOOD GLUCOSE TEST test strip  07/31/12  Yes Historical Provider, MD  furosemide (LASIX) 20 MG tablet Take 1 tablet (20 mg total) by mouth 2 (two) times daily. 12/16/12  Yes Hadassah Pais, PA-C  gabapentin (NEURONTIN) 300 MG capsule Take 300 mg by mouth 2 (two) times daily. One capsule in the morning and 2 capsules at noon   Yes Historical Provider, MD  [facility] LANCETS MISC  07/31/12  Yes Historical Provider, MD  Multiple Vitamin (MULTIVITAMIN) tablet Take 1 tablet by mouth daily.     Yes Historical Provider, MD  potassium chloride SA (K-DUR,KLOR-CON) 20 MEQ tablet Take 1 tablet (20 mEq total) by mouth daily. 12/16/12  Yes Hadassah Pais, PA-C  PROCTOZONE-HC 2.5 % rectal cream Place 1 application rectally 2 (two) times daily.  07/24/12  Yes Historical Provider, MD  sertraline (ZOLOFT) 100 MG tablet Take 100 mg by mouth daily.     Yes Historical Provider, MD  traZODone (DESYREL) 50 MG tablet Take 150 mg by mouth at bedtime.    Yes Historical Provider, MD    Allergies:  Allergies  Allergen Reactions  . Penicillins Rash    History   Social History  . Marital Status: Married    Spouse Name: N/A     Number of Children: N/A  . Years of Education: N/A   Occupational History  . Not on file.   Social History Main Topics  . Smoking status: Never Smoker   . Smokeless tobacco: Not on file  . Alcohol Use: No  . Drug Use: No  . Sexually Active: Not on file   Other Topics Concern  . Not on file   Social History Narrative   Married and has 4 children. Has significant exposure to 2nd hand smoke. Works as a Actor.      Family History  Problem Relation Age of Onset  . Cancer Father     lung    Review of Systems: General: negative for chills, fever, night sweats or weight changes.  Cardiovascular: negative for chest pain, shortness of breath, dyspnea on exertion, edema, orthopnea, palpitations, paroxysmal nocturnal dyspnea  Dermatological: negative for rash Respiratory: negative for cough or wheezing Urologic: negative for hematuria Abdominal: negative for nausea, vomiting, diarrhea, bright red blood per rectum, melena, or hematemesis Neurologic: negative for visual changes, syncope, or dizziness All other systems reviewed and are otherwise negative except as noted above.  Labs:   Lab Results  Component Value Date   WBC 4.8 12/16/2012   HGB 13.3 12/16/2012   HCT 38.6* 12/16/2012   MCV 96.0 12/16/2012   PLT 181 12/16/2012   No results found for this basename: NA, K, CL, CO2, BUN, CREATININE, CALCIUM, LABALBU, PROT, BILITOT, ALKPHOS, ALT, AST, GLUCOSE,  in the last 168 hours No results found for this basename: CKTOTAL, CKMB, TROPONINI,  in the last 72 hours No results found for this basename: CHOL, HDL, LDLCALC, TRIG   No results found for this basename: DDIMER    Radiology/Studies:  No results found.    Physical Exam: Blood pressure 151/77, pulse 84, temperature 98.6 F (37 C), temperature source Oral, resp. rate 18, height 5\' 3"  (1.6 m), weight 54.432 kg (120 lb), SpO2 96.00%. General: Well developed, well nourished, in no acute distress. Head:  Normocephalic, atraumatic, sclera non-icteric, nares are without discharge Neck: Supple. Negative for carotid bruits. JVD not elevated. Lungs: Clear bilaterally to auscultation without wheezes, rales, or rhonchi. Breathing is unlabored. Heart: RRR with S1 S2. No murmurs, rubs, or gallops appreciated. Abdomen: Soft, non-tender, non-distended with normoactive bowel sounds. No rebound/guarding. No obvious abdominal masses. Msk:  Strength and tone appear normal for age. Extremities: No edema. No clubbing or cyanosis. Distal pedal pulses are 2+ and equal bilaterally. Nickel sized firmness in R groin, nontender, no bruit, pulses intact Neuro: Alert and oriented X 3. Moves all extremities spontaneously. Psych:  Responds to questions appropriately with a normal affect.  ASSESSMENT AND PLAN:  72 y.o. male w/ PMHx significant for aortic stenosis who presented to [facility] on 12/25/2012 for left heart cath in anticipation of surgery for his aortic stenosis. Course complicated by small R hematoma that occurred after ambulation.  Resolved with manual pressure. No red flags.  Restart bed rest x 4 hours.  Admit to obs overnight to ensure stability.  Continued home meds.   Signed, Adolm Joseph, Aliece Honold C. MD 12/26/2012, 4:34 AM

## 2012-12-26 NOTE — Progress Notes (Signed)
Pt/family given discharge instructions, medication lists, follow up appointments, and when to call the doctor.  Pt/family verbalizes understanding. Pt given signs and symptoms of infection. [name]    

## 2012-12-26 NOTE — Progress Notes (Signed)
   Patient Name: [name] Date of Encounter: 12/26/2012   Principal Problem:   Groin hematoma Active Problems:   Aortic valve stenosis, moderate   Neuroendocrine tumor   HYPERLIPIDEMIA   SUBJECTIVE  No chest pain or sob.  No groin pain.  Site remains soft.  CURRENT MEDS . acetaminophen  500 mg Oral TID  . atorvastatin  40 mg Oral q1800  . traZODone  50 mg Oral QHS    OBJECTIVE  Filed Vitals:   12/25/12 2059 12/25/12 2100 12/25/12 2107 12/26/12 0451  BP: 123/66 134/77 151/77 117/54  Pulse: 76 80 84 63  Temp:    97.6 F (36.4 C)  TempSrc:    Oral  Resp:    18  Height:      Weight:      SpO2: 96% 97% 96% 98%    Intake/Output Summary (Last 24 hours) at 12/26/12 0920 Last data filed at 12/25/12 2324  Gross per 24 hour  Intake      0 ml  Output    300 ml  Net   -300 ml   Filed Weights   12/25/12 1325  Weight: 120 lb (54.432 kg)    PHYSICAL EXAM  General: Pleasant, NAD. Neuro: Alert and oriented X 3. Moves all extremities spontaneously. Psych: Normal affect. HEENT:  Normal  Neck: Supple without JVD.  Radiated murmur bilat. Lungs:  Resp regular and unlabored, CTA. Heart: RRR no s3, s4.  3/6 SEM bilat usb, heard throughout and radiating to neck bilat.  Audible S2. Abdomen: Soft, non-tender, non-distended, BS + x 4.  Extremities: No clubbing, cyanosis or edema. DP/PT/Radials 2+ and equal bilaterally.  Accessory Clinical Findings  No labs  TELE  rsr  ASSESSMENT AND PLAN  1. R groin hematoma:  Resolved/stable.  Ambulate this AM and d/c home.  2.  Mod-Sev AS:  S/p R/L heart cath yesterday.  TCTS f/u arranged as outpt.  3.  Neuroendocrine tumor:  Followed @ [facility].  Will need somatostatin infusion during AVR and post-op.  Signed, Nicolasa Ducking NP  Patient seen and examined independently. Gilford Raid, NP note reviewed carefully - agree with his assessment and plan. I have edited the note based on my findings.   He is ok for d/c. Groin looks  stable. F/u TCTS.  Georgana Romain,MD 9:54 AM

## 2012-12-26 NOTE — Discharge Summary (Signed)
Patient ID: [name],  MRN: [id number], DOB/AGE: 04-14-1941 72 y.o.  Admit date: 12/25/2012 Discharge date: 12/26/2012  Primary Care Provider: Mila Palmer A Primary Cardiologist: D. Devantae Babe, MD  Discharge Diagnoses Principal Problem:   Groin hematoma Active Problems:   Aortic valve stenosis, moderate   Neuroendocrine tumor   HYPERLIPIDEMIA  Allergies Allergies  Allergen Reactions  . Penicillins Rash   Procedures  Cardiac Catheterization 12/25/2012  Findings:  RA = 4 RV = 31/3/7 PA = 31/8 (18) PCW = 9 Fick cardiac output/index = 4.2/2.7 PVR = 1.0 Woods FA sat = 95% PA sat = 68%, 73%  Ao Pressure: 135/58 (89) LV Pressure:  166/8/16 Simultaneous LV-AO gradient: 31 mm HG (peak to peak) (mean) AVA 0.78cm2  Left main: Absent. Probable separate LAD and LCX ostia LAD: Moderate sized vessel. Which bifurcated in the midsection into a large D2 and small distal LA. 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 There is a 70-80% stenosis in the proximal portion of the D1. The remainder of the LAD system is normal.  LCX: Small vessel. Gives off OM-1 and OM-2. Normal. RCA: Large dominant vessel. With a large PDA and 3 PLS. Normal LV-gram done in the RAO projection: Ejection fraction = 60%. Nor regional wall motion abnormalities. The AoV is heavily calcified.  Assessment: 1. 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 Normal intracardiac pressures 3. Moderate to severe aortic stenosis _____________  History of Present Illness  72 y/o male with prior h/o progressive aortic stenosis complicated by a h/o mesenteric mass/neuroendocrine tumor, which has been managed at [facility].  He was recently seen in clinic by Dr. Gala Romney and echo was reviwed and showed moderate to severe AS.  After discussion  with his oncologist at [facility], it was decided to proceed with right and left heart cardiac catheterization in preparation for elective AVR.  Hospital Course  Pt presented to the outpatient cath lab and underwent right and left heart cardiac catheterization with results as outlined above.  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  LV function was normal, as was filling pressures.  Outpatient Thoracic Surgery referral was made and patient was to be discharged home on 4/30.  Upon ambulating however, he developed a hematoma involving his right groin.  This was manually compressed without difficulty.  Given the late hour however, the patient was kept in observation overnight.  He has had no recurrence of hematoma or bleeding and will be discharged home this AM in good condition.  Discharge Vitals Blood pressure 117/54, pulse 63, temperature 97.6 F (36.4 C), temperature source Oral, resp. rate 18, height 5\' 3"  (1.6 m), weight 120 lb (54.432 kg), SpO2 98.00%.  Filed Weights   12/25/12 1325  Weight: 120 lb (54.432 kg)   Labs  None  Disposition  Pt is being discharged home today in good condition.  Follow-up Plans & Appointments  Follow-up Information   Follow up with [facility] On 01/30/2013. (10:40 AM with Dr. Gala Romney)    Contact information:   [address] [place] [address] [phone]      Follow up with [name], MD. (Dr. Sharee Pimple office will contact you to arrange follow-up appointment.)    Contact information:  [address] [place] [address] [phone]       Discharge Medications    Medication List    TAKE these medications       acetaminophen 500 MG tablet  Commonly known as:  TYLENOL  Take 500 mg by mouth 3 (three) times daily.     aspirin EC 81 MG tablet  Take 81 mg by mouth daily.       atorvastatin 40 MG tablet  Commonly known as:  LIPITOR  Take 40 mg by mouth daily.     busPIRone 15 MG tablet  Commonly known as:  BUSPAR  Take 15-30 mg by mouth 2 (two) times daily. One tablet every morning and two tablets every day at noon     Cholestyramine Powd  Take 1 scoop by mouth 2 (two) times daily.     desloratadine 5 MG tablet  Commonly known as:  CLARINEX  Take 5 mg by mouth daily.     fenofibrate 160 MG tablet  Take 160 mg by mouth daily.     FLONASE 50 MCG/ACT nasal spray  Generic drug:  fluticasone  Place 2 sprays into the nose daily.     FORA V12 BLOOD GLUCOSE TEST test strip  Generic drug:  glucose blood     furosemide 20 MG tablet  Commonly known as:  LASIX  Take 1 tablet (20 mg total) by mouth 2 (two) times daily.     gabapentin 300 MG capsule  Commonly known as:  NEURONTIN  Take 300 mg by mouth 2 (two) times daily. One capsule in the morning and 2 capsules at noon     [facility] LANCETS Misc     multivitamin tablet  Take 1 tablet by mouth daily.     potassium chloride SA 20 MEQ tablet  Commonly known as:  K-DUR,KLOR-CON  Take 1 tablet (20 mEq total) by mouth daily.     PROCTOZONE-HC 2.5 % rectal cream  Generic drug:  hydrocortisone  Place 1 application rectally 2 (two) times daily.     RESTASIS 0.05 % ophthalmic emulsion  Generic drug:  cycloSPORINE  Place 1 drop into both eyes 2 (two) times daily.     sertraline 100 MG tablet  Commonly known as:  ZOLOFT  Take 100 mg by mouth daily.     traZODone 50 MG tablet  Commonly known as:  DESYREL  Take 150 mg by mouth at bedtime.      Outstanding Labs/Studies  none  Duration of Discharge Encounter   Greater than 30 minutes including physician time.  Signed, Nicolasa Ducking NP 12/26/2012, 9:48 AM   Patient seen and examined independently. Gilford Raid, NP note reviewed carefully - agree with his assessment and plan. I have edited the note based on my findings.   He is ok for  d/c. Groin looks stable. F/u TCTS.  Aracelie Addis,MD 2:20 PM

## 2013-01-08 ENCOUNTER — Encounter: Payer: Self-pay | Admitting: Surgery

## 2013-01-08 ENCOUNTER — Ambulatory Visit (INDEPENDENT_AMBULATORY_CARE_PROVIDER_SITE_OTHER): Payer: Medicare Other | Admitting: Surgery

## 2013-01-08 VITALS — BP 120/69 | HR 68 | Resp 18 | Ht 63.0 in | Wt 122.0 lb

## 2013-01-08 DIAGNOSIS — I359 Nonrheumatic aortic valve disorder, unspecified: Secondary | ICD-10-CM | POA: Diagnosis not present

## 2013-01-08 DIAGNOSIS — I350 Nonrheumatic aortic (valve) stenosis: Secondary | ICD-10-CM

## 2013-01-08 NOTE — Progress Notes (Signed)
[address]       [name] [address]             [phone]       PCP is Emeterio Reeve, MD Referring Provider is Bensimhon, Bevelyn Buckles, MD  Chief Complaint  Patient presents with  . Aortic Stenosis    Surgical eval, cardiac cath 12/26/12, ECHO 12/16/12    HPI:  The patient is a 72 year old gentleman with type II DM, hyperlipidemia, and aortic stenosis that has been followed for several years and has been progressively worsening. An echo in January 2013 showed a mean gradient of 35. Echo now shows a mean gradient of 39 with an AVA of 0.7. He reports slowly progressive symptoms with exertional dyspnea and fatigue, some dizziness with standing up. 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 This is being followed at [facility] and felt to be stable.   Past Medical History  Diagnosis Date  . Aortic stenosis     moderate. Echo 3/10: EF 55-60%. mean gradient 25 mmHg. AVA 1.23cm2. Echo 3/11: Normal EF. mean AVA gradient . exercise myoview 2008: positive ECG. normal perfusion  . Exposure to secondhand smoke     extensive exposure  . Hyperlipidemia     treated  . Depression   . Asthma   . DM2 (diabetes mellitus, type 2)   . Heart murmur   . Testicular cancer     s/p XRT and surgical resection.  . Chronic lymphocytic leukemia     followed by Dr. Candyce Churn  . LBBB (left bundle branch block)     Past Surgical History  Procedure Laterality Date  . Xrt and surgical resection      s/p. 10 years ago  . Stapendectomy  07/30/02    right    Family History  Problem Relation Age of Onset  . Cancer Father     lung    Social History History  Substance Use Topics  . Smoking status: Never Smoker   . Smokeless tobacco: Not on file  . Alcohol Use: No    Current Outpatient Prescriptions  Medication Sig Dispense Refill  . acetaminophen (TYLENOL) 500 MG tablet Take  500 mg by mouth 3 (three) times daily.       Marland Kitchen aspirin EC 81 MG tablet Take 81 mg by mouth daily.      Marland Kitchen atorvastatin (LIPITOR) 40 MG tablet Take 40 mg by mouth daily.      . busPIRone (BUSPAR) 15 MG tablet Take 15-30 mg by mouth 2 (two) times daily. One tablet every morning and two tablets every day at noon      . Cholestyramine POWD Take 1 scoop by mouth 2 (two) times daily.       . cycloSPORINE (RESTASIS) 0.05 % ophthalmic emulsion Place 1 drop into both eyes 2 (two) times daily.       Marland Kitchen desloratadine (CLARINEX) 5 MG tablet Take 5 mg by mouth daily.        . fenofibrate 160 MG tablet Take 160 mg by mouth daily.        . fluticasone (FLONASE) 50 MCG/ACT nasal spray Place 2 sprays into the nose daily.       Marland Kitchen FORA V12 BLOOD GLUCOSE TEST test strip       . furosemide (LASIX) 20 MG tablet Take 1 tablet (20 mg total) by mouth  2 (two) times daily.  30 tablet  6  . gabapentin (NEURONTIN) 300 MG capsule Take 300 mg by mouth 2 (two) times daily. One capsule in the morning and 2 capsules at noon      . LITETOUCH LANCETS MISC       . Multiple Vitamin (MULTIVITAMIN) tablet Take 1 tablet by mouth daily.        . potassium chloride SA (K-DUR,KLOR-CON) 20 MEQ tablet Take 1 tablet (20 mEq total) by mouth daily.  30 tablet  6  . PROCTOZONE-HC 2.5 % rectal cream Place 1 application rectally 2 (two) times daily.       . sertraline (ZOLOFT) 100 MG tablet Take 100 mg by mouth daily.        . traZODone (DESYREL) 50 MG tablet Take 150 mg by mouth at bedtime.        No current facility-administered medications for this visit.    Allergies  Allergen Reactions  . Penicillins Rash    Review of Systems  Constitutional: Positive for activity change and fatigue. Negative for fever, chills, diaphoresis, appetite change and unexpected weight change.  HENT: Negative.   Eyes: Negative.   Respiratory: Positive for shortness of breath.   Cardiovascular: Positive for leg swelling. Negative for chest pain and  palpitations.  Gastrointestinal: Negative.   Endocrine: Negative.   Genitourinary: Negative.   Musculoskeletal: Negative.   Skin: Negative.   Allergic/Immunologic: Negative.   Neurological: Negative.   Hematological: Negative.   Psychiatric/Behavioral: Negative.     BP 120/69  Pulse 68  Resp 18  Ht 5\' 3"  (1.6 m)  Wt 122 lb (55.339 kg)  BMI 21.62 kg/m2  SpO2 98% Physical Exam  Constitutional: He appears well-developed and well-nourished. No distress.  HENT:  Head: Normocephalic and atraumatic.  Mouth/Throat: Oropharynx is clear and moist.  Eyes: Conjunctivae and EOM are normal. Pupils are equal, round, and reactive to light.  Neck: Normal range of motion. Neck supple. No JVD present. No tracheal deviation present. No thyromegaly present.  Cardiovascular: Normal rate, regular rhythm and intact distal pulses.  Exam reveals no gallop and no friction rub.   Murmur heard. 3/6 crescendo/decrescendo murmur over aorta.  Pulmonary/Chest: Effort normal. He has no rales.  Abdominal: Soft. Bowel sounds are normal. He exhibits no distension and no mass. There is no tenderness.  Musculoskeletal: He exhibits no edema.  Lymphadenopathy:    He has no cervical adenopathy.  Neurological: He is alert. He has normal strength. No cranial nerve deficit or sensory deficit.  Skin: Skin is warm and dry.  Psychiatric: He has a normal mood and affect.     Diagnostic Tests:  CARDIAC CATH:  Findings:  RA = 4 RV = 31/3/7 PA = 31/8 (18) PCW = 9 Fick cardiac output/index = 4.2/2.7 PVR = 1.0 Woods FA sat = 95% PA sat = 68%, 73%  Ao Pressure: 135/58 (89) LV Pressure:  166/8/16 Simultaneous LV-AO gradient: 31 mm HG (peak to peak) (mean) AVA 0.78cm2  Left main: Absent. Probable separate LAD and LCX ostia  LAD: Moderate sized vessel. Which bifurcated in the midsection into a large D2 and small distal LA. 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 There is a 70-80% stenosis in the proximal portion of the D1. The remainder of the LAD system is normal.   LCX: Small vessel. Gives off OM-1 and OM-2. Normal.  RCA: Large dominant  vessel. With a large PDA and 3 PLS. Normal  LV-gram done in the RAO projection: Ejection fraction = 60%. Nor regional wall motion abnormalities. The AoV is heavily calcified.  Assessment: 1. 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 Normal intracardiac pressures 3. Moderate to severe aortic stenosis  Plan/Discussion:  Will arrange for him to see TCTS for AVR and possible ligation of fistulous D1. Will order chest CTA to further evaluate anomalous vessel. Given neuroendocrine tumor will need somatostatin infusion during AVR and post-op.   Arvilla Meres, MD 4:23 PM    *[place]*                *[facility]*                      [address]                     [place], [place] [address]                         [phone]   ------------------------------------------------------------ Transthoracic Echocardiography  Patient:    Darriel, Utter MR #:       [id number] Study Date: 12/16/2012 Gender:     M Age:        85 Height:     160cm Weight:     57.2kg BSA:        1.25m^2 Pt. Status: Room:    PERFORMING   [facility]    Mila Palmer  SONOGRAPHER  [place], RDCS  ORDERING     Bensimhon, Daniel cc:  ------------------------------------------------------------  ------------------------------------------------------------ Indications:      Aortic stenosis 424.1.  ------------------------------------------------------------ History:   Risk factors:  Testicular Cancer. Leukemia. Asthma. Calcified mesenteric mass. Dyslipidemia.  ------------------------------------------------------------ Study  Conclusions  - Left ventricle: LVEF Is approximately 55 to 60% with   hypokinesis of the distal inferoseptal wall. The cavity   size was normal. Wall thickness was increased in a pattern   of mild LVH. Doppler parameters are consistent with   abnormal left ventricular relaxation (grade 1 diastolic   dysfunction). - Aortic valve: AV is difficult to see. It is thickened,   calcified with restricted motion. Peak and mean gradients   through valve are50 and 32 mm Hg respectively consistent   with moderate AS. Transthoracic echocardiography.  M-mode, complete 2D, spectral Doppler, and color Doppler.  Height:  Height: 160cm. Height: 63in.  Weight:  Weight: 57.2kg. Weight: 125.7lb.  Body mass index:  BMI: 22.3kg/m^2.  Body surface area:    BSA: 1.29m^2.  Blood pressure:     134/72.  Patient status:  Outpatient.  Location:  Echo laboratory.  ------------------------------------------------------------  ------------------------------------------------------------ Left ventricle:  LVEF Is approximately 55 to 60% with hypokinesis of the distal inferoseptal wall. The cavity size was normal. Wall thickness was increased in a pattern of mild LVH. Doppler parameters are consistent with abnormal left ventricular relaxation (grade 1 diastolic dysfunction).   ------------------------------------------------------------ Aortic valve:  AV is difficult to see. It is thickened, calcified with restricted motion. Peak and mean gradients through valve are50 and 32 mm Hg respectively consistent with moderate AS.  Doppler:   No significant regurgitation.   VTI ratio of LVOT to aortic valve: 0.2. Valve area: 0.7cm^2(VTI). Indexed valve area: 0.44cm^2/m^2 (VTI). Peak velocity ratio of LVOT to aortic valve: 0.2.  Valve area: 0.71cm^2 (Vmax). Indexed valve area: 0.45cm^2/m^2 (Vmax). Mean gradient: 31mm Hg (S). Peak gradient: 50mm Hg (S).   ------------------------------------------------------------ Mitral  valve:   Mildly thickened leaflets .  Doppler: Trivial regurgitation.    Peak gradient: 3mm Hg (D).  ------------------------------------------------------------ Left atrium:  The atrium was normal in size.  ------------------------------------------------------------ Right ventricle:  The cavity size was normal. Wall thickness was normal. Systolic function was normal.  ------------------------------------------------------------ Tricuspid valve:   Structurally normal valve.   Leaflet separation was normal.  Doppler:  Transvalvular velocity was within the normal range.  Mild regurgitation.  ------------------------------------------------------------ Right atrium:  The atrium was mildly dilated.  ------------------------------------------------------------ Pericardium:  There was no pericardial effusion.  ------------------------------------------------------------  2D measurements        Normal  Doppler measurements   Normal Left ventricle                 Main pulmonary LVID ED,   40.5 mm     43-52   artery chord,                         Pressure,    28 mm Hg  =30 PLAX                           S LVID ES,   26.7 mm     23-38   Left ventricle chord,                         Ea, lat    8.22 cm/s   ------ PLAX                           ann, tiss FS, chord,   34 %      >29     DP PLAX                           E/Ea, lat  10.4        ------ LVPW, ED   11.7 mm     ------  ann, tiss     1 IVS/LVPW   0.93        <1.3    DP ratio, ED                      Ea, med    6.58 cm/s   ------ Ventricular septum             ann, tiss IVS, ED    10.9 mm     ------  DP LVOT                           E/Ea, med  13.0        ------ Diam, S      21 mm     ------  ann, tiss     1 Area       3.46 cm^2   ------  DP Aorta                          LVOT Root diam,   32 mm     ------  Peak vel,  72.3 cm/s   ------ ED  S AAo AP       30 mm     ------  VTI, S     18.8 cm      ------ diam, S                        Aortic valve Left atrium                    Peak vel,   354 cm/s   ------ AP dim       30 mm     ------  S AP dim     1.89 cm/m^2 <2.2    Mean vel,   265 cm/s   ------ index                          S Vol, S       57 ml     ------  VTI, S     92.5 cm     ------ Vol index, 35.8 ml/m^2 ------  Mean         31 mm Hg  ------ S                              gradient,                                S                                Peak         50 mm Hg  ------                                gradient,                                S                                VTI ratio   0.2        ------                                LVOT/AV                                Area, VTI   0.7 cm^2   ------                                Area index 0.44 cm^2/m ------                                (VTI)           ^2  Peak vel    0.2        ------                                ratio,                                LVOT/AV                                Area, Vmax 0.71 cm^2   ------                                Area index 0.45 cm^2/m ------                                (Vmax)          ^2                                Mitral valve                                Peak E vel 85.6 cm/s   ------                                Peak A vel 94.5 cm/s   ------                                Decelerati  183 ms     150-23                                on time                0                                Peak          3 mm Hg  ------                                gradient,                                D                                Peak E/A    0.9        ------                                ratio  Tricuspid valve                                Regurg      242 cm/s   ------                                peak vel                                Peak RV-RA   23 mm Hg  ------                                 gradient,                                S                                Systemic veins                                Estimated     5 mm Hg  ------                                CVP                                Right ventricle                                Pressure,    28 mm Hg  <30                                S                                Sa vel,    11.5 cm/s   ------                                lat ann,                                tiss DP   ------------------------------------------------------------ Prepared and Electronically Authenticated by  Dietrich Pates [id number]   Impression:  He has moderate to severe symptomatic aortic stenosis that should be treated with aortic valve replacement. His aorta looks a little enlarged on the LV gram but it is only possible to see the aortic root. I think it would be prudent to do a CTA of the chest to fully assess the size of the aorta to determine if it requires replacement. 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 This  is relatively small and there is a narrowing at the site of drainage into the low pressure end. This may be visible on CTA but I would only ligate this if it as easily visible in the OR. I doubt that it is causing any problem. I discussed all of this with the patient and his daughter including the pros and cons of mechanical and tissue valves, my reccomendation for a tissue valve, and answered all of their questions.   Plan: He will have a CTA of the chest and then return to see me to review the results and make plans for surgery. His daughter is going to be his caregiver and she is having surgery in June so he would like to wait until July to have surgery.

## 2013-01-09 ENCOUNTER — Other Ambulatory Visit: Payer: Self-pay | Admitting: *Deleted

## 2013-01-09 DIAGNOSIS — I350 Nonrheumatic aortic (valve) stenosis: Secondary | ICD-10-CM

## 2013-01-15 ENCOUNTER — Encounter: Payer: Self-pay | Admitting: Surgery

## 2013-01-15 ENCOUNTER — Ambulatory Visit (INDEPENDENT_AMBULATORY_CARE_PROVIDER_SITE_OTHER): Payer: Medicare Other | Admitting: Surgery

## 2013-01-15 ENCOUNTER — Ambulatory Visit
Admission: RE | Admit: 2013-01-15 | Discharge: 2013-01-15 | Disposition: A | Discharge status: Home / Self Care | Payer: Medicare Other | Source: Ambulatory Visit | Attending: Surgery | Admitting: Surgery

## 2013-01-15 VITALS — BP 102/60 | HR 78 | Resp 18 | Ht 63.0 in | Wt 120.0 lb

## 2013-01-15 DIAGNOSIS — I700 Atherosclerosis of aorta: Secondary | ICD-10-CM | POA: Diagnosis not present

## 2013-01-15 DIAGNOSIS — I350 Nonrheumatic aortic (valve) stenosis: Secondary | ICD-10-CM

## 2013-01-15 DIAGNOSIS — Z01818 Encounter for other preprocedural examination: Secondary | ICD-10-CM | POA: Diagnosis not present

## 2013-01-15 DIAGNOSIS — I359 Nonrheumatic aortic valve disorder, unspecified: Secondary | ICD-10-CM | POA: Diagnosis not present

## 2013-01-15 DIAGNOSIS — I2510 Atherosclerotic heart disease of native coronary artery without angina pectoris: Secondary | ICD-10-CM | POA: Diagnosis not present

## 2013-01-15 MED ORDER — IOHEXOL 350 MG/ML SOLN
80.0000 mL | Freq: Once | INTRAVENOUS | Status: AC | PRN
  Administered 2013-01-15: 80 mL via INTRAVENOUS

## 2013-01-15 NOTE — Progress Notes (Signed)
[address]       [name] [address]             [phone]      HPI:  Returns today to review the CT angiogram of the chest which was done to assess the size of this aortic root and ascending aorta. This shows mild dilatation of the ascending aorta with a maximum diameter at the level of the right pulmonary artery of 3.7 cm. The distal aortic arch and descending aorta are 2.2 cm and 1.9 cm in diameter respectively. The maximum diameter of the sinuses of Valsalva is about 4 cm which is borderline dilated. 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  Current Outpatient Prescriptions  Medication Sig Dispense Refill  . acetaminophen (TYLENOL) 500 MG tablet Take 500 mg by mouth 3 (three) times daily.       Marland Kitchen aspirin EC 81 MG tablet Take 81 mg by mouth daily.      Marland Kitchen atorvastatin (LIPITOR) 40 MG tablet Take 40 mg by mouth daily.      . busPIRone (BUSPAR) 15 MG tablet Take 15-30 mg by mouth 2 (two) times daily. One tablet every morning and two tablets every day at noon      . Cholestyramine POWD Take 1 scoop by mouth 2 (two) times daily.       . cycloSPORINE (RESTASIS) 0.05 % ophthalmic emulsion Place 1 drop into both eyes 2 (two) times daily.       Marland Kitchen desloratadine (CLARINEX) 5 MG tablet Take 5 mg by mouth daily.        . fenofibrate 160 MG tablet Take 160 mg by mouth daily.        . fluticasone (FLONASE) 50 MCG/ACT nasal spray Place 2 sprays into the nose daily.       Marland Kitchen FORA V12 BLOOD GLUCOSE TEST test strip       . furosemide (LASIX) 20 MG tablet Take 1 tablet (20 mg total) by mouth 2 (two) times daily.  30 tablet  6  . gabapentin (NEURONTIN) 300 MG capsule Take 300 mg by mouth 2 (two) times daily. One capsule in the morning and 2 capsules at noon      . LITETOUCH LANCETS MISC       . Multiple Vitamin (MULTIVITAMIN) tablet Take 1  tablet by mouth daily.        . potassium chloride SA (K-DUR,KLOR-CON) 20 MEQ tablet Take 1 tablet (20 mEq total) by mouth daily.  30 tablet  6  . PROCTOZONE-HC 2.5 % rectal cream Place 1 application rectally 2 (two) times daily.       . sertraline (ZOLOFT) 100 MG tablet Take 100 mg by mouth daily.        . traZODone (DESYREL) 50 MG tablet Take 150 mg by mouth at bedtime.        No current facility-administered medications for this visit.     Physical Exam: BP 102/60  Pulse 78  Resp 18  Ht 5\' 3"  (1.6 m)  Wt 120 lb (54.432 kg)  BMI 21.26 kg/m2  SpO2 98% He looks well. Cardiac exam shows a regular rate and rhythm with a 3/6 systolic murmur over the aorta. Lungs are clear.  Diagnostic Tests:  *RADIOLOGY REPORT*   Clinical Data: Preoperative evaluation prior to aortic valve replacement.  History of right-sided testicular  cancer.   CT ANGIOGRAPHY CHEST   Technique:  Multidetector CT imaging of the chest using the standard protocol during bolus administration of intravenous contrast. Multiplanar reconstructed images including MIPs were obtained and reviewed to evaluate the vascular anatomy.   Contrast: 80mL OMNIPAQUE IOHEXOL 350 MG/ML SOLN   Comparison: PET CT 08/16/2012.   Findings:   Mediastinum: Heart size is normal. There is no significant pericardial fluid, thickening or pericardial calcification. Extensive thickening and calcification of the aortic valve. Sinuses of Valsalva appear borderline dilated measuring approximately 4 cm in diameter on today's non gated CT examination. Ascending thoracic aorta is slightly prominent measuring 3.7 cm in diameter.  The distal aortic arch and descending thoracic aorta are normal in caliber measuring 2.2 cm and 1.9 cm in diameter respectively.  No thoracic aortic dissection at this time. There is atherosclerosis of the thoracic aorta, the great vessels of the mediastinum and the coronary arteries, including  calcified atherosclerotic plaque in the left anterior descending coronary arteries. No pathologically enlarged mediastinal or hilar lymph nodes. Esophagus is unremarkable in appearance.   Lungs/Pleura: Linear opacities in the medial segment of the right middle lobe and the left lower lobe are favored to represent areas of chronic scarring or subsegmental atelectasis.  Azygos lobe (normal anatomical variant) incidentally noted.  No acute consolidative air space disease.  No pleural effusions. Nonspecific 2 mm right upper lobe nodule (image 28 of series 5) is noted.  No other larger more suspicious appearing pulmonary nodules or masses are identified.   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   Musculoskeletal: There are no aggressive appearing lytic or blastic lesions noted in the visualized portions of the skeleton.   IMPRESSION: 1.  Severe thickening and calcifications of the aortic valve, compatible with the patient's history of aortic stenosis.  Sinuses of Valsalva appear borderline dilated measuring approximately 4 cm in diameter on today's non-gated CT examination. 2.  Nonspecific 2 mm right upper lobe pulmonary nodule.  This is statistically likely benign. If the patient is at high risk for bronchogenic carcinoma, follow-up chest CT at 1 year is recommended.  If the patient is at low risk, no follow-up is needed.  This recommendation follows the consensus statement: Guidelines for Management of Small Pulmonary Nodules Detected on CT Scans:  A Statement from the Fleischner Society as published in Radiology 2005; 237:395-400. 3. Atherosclerosis, including left anterior  descending coronary artery disease. Assessment for potential risk factor modification, dietary therapy or pharmacologic therapy may be warranted, if clinically indicated.    4.  1.6 cm hypervascular lesion in the medial aspect of the spleen is unchanged compared to the prior examination and was not hypermetabolic on the prior PET CT.  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     Original Report Authenticated By: Trudie Reed, M.D.     Impression:  He has mild dilatation of the ascending aorta with moderate to severe aortic stenosis. I don't think he requires replacement of the ascending aorta since it is less than 4 cm and he is 72 years old. I reviewed the CT scan with the patient and his daughter and my recommendation for leaving the  aorta alone. I would plan to use a tissue valve to replace his aortic valve. I discussed the operative procedure with the patient and family including alternatives, benefits and risks; including but not limited to bleeding, blood transfusion, infection, stroke, myocardial infarction, graft failure, heart block requiring a permanent pacemaker, organ dysfunction, and death.  Arrie Senate understands and agrees to proceed.    Plan:  Aortic valve replacement on March 03, 2013 per patient's request. We will use somatostatin infusion during the procedure due to his hx of an abdominal neuroendocrine tumor if that is recommended. He is returning to see his physician at [facility] in June concerning this.

## 2013-01-16 ENCOUNTER — Other Ambulatory Visit: Payer: Self-pay | Admitting: *Deleted

## 2013-01-16 DIAGNOSIS — R141 Gas pain: Secondary | ICD-10-CM | POA: Diagnosis not present

## 2013-01-16 DIAGNOSIS — E1129 Type 2 diabetes mellitus with other diabetic kidney complication: Secondary | ICD-10-CM | POA: Diagnosis not present

## 2013-01-16 DIAGNOSIS — E782 Mixed hyperlipidemia: Secondary | ICD-10-CM | POA: Diagnosis not present

## 2013-01-16 DIAGNOSIS — I359 Nonrheumatic aortic valve disorder, unspecified: Secondary | ICD-10-CM

## 2013-01-16 DIAGNOSIS — N182 Chronic kidney disease, stage 2 (mild): Secondary | ICD-10-CM | POA: Diagnosis not present

## 2013-01-16 DIAGNOSIS — R142 Eructation: Secondary | ICD-10-CM | POA: Diagnosis not present

## 2013-01-16 DIAGNOSIS — Z01818 Encounter for other preprocedural examination: Secondary | ICD-10-CM | POA: Diagnosis not present

## 2013-01-23 ENCOUNTER — Telehealth (HOSPITAL_COMMUNITY): Payer: Self-pay | Admitting: *Deleted

## 2013-01-23 DIAGNOSIS — R609 Edema, unspecified: Secondary | ICD-10-CM

## 2013-01-23 MED ORDER — FUROSEMIDE 20 MG PO TABS: ORAL_TABLET | ORAL | Status: DC

## 2013-01-23 NOTE — Telephone Encounter (Signed)
Pt called to ask about increasing his lasix, he states at last OV it was prescribed for swelling in his right leg, he has been taking 20 mg BID which is what was sent to the pharmacy, but he continues to have swelling, per Dr Gala Romney ok to increase to 40 mg in AM and 20 mg in PM and have BMET, pt is aware and agreeable, rx sent into pharmacy and will to go [place] for labs, order placed

## 2013-01-27 ENCOUNTER — Other Ambulatory Visit: Payer: Medicare Other

## 2013-01-28 ENCOUNTER — Other Ambulatory Visit (INDEPENDENT_AMBULATORY_CARE_PROVIDER_SITE_OTHER): Payer: Medicare Other

## 2013-01-28 DIAGNOSIS — R609 Edema, unspecified: Secondary | ICD-10-CM

## 2013-01-28 LAB — BASIC METABOLIC PANEL
Calcium: 9.1 mg/dL (ref 8.4–10.5)
Chloride: 107 mEq/L (ref 96–112)
Creatinine, Ser: 1 mg/dL (ref 0.4–1.5)
GFR: 74.33 mL/min (ref >60.00)

## 2013-01-30 ENCOUNTER — Ambulatory Visit
Admission: RE | Admit: 2013-01-30 | Discharge: 2013-01-30 | Disposition: A | Discharge status: Home / Self Care | Payer: Medicare Other | Source: Ambulatory Visit | Attending: Internal Medicine | Admitting: Internal Medicine

## 2013-01-30 ENCOUNTER — Encounter (HOSPITAL_COMMUNITY): Payer: Self-pay

## 2013-01-30 ENCOUNTER — Ambulatory Visit (HOSPITAL_COMMUNITY)
Admission: RE | Admit: 2013-01-30 | Discharge: 2013-01-30 | Disposition: A | Discharge status: Home / Self Care | Payer: Medicare Other | Source: Ambulatory Visit | Attending: Internal Medicine | Admitting: Internal Medicine

## 2013-01-30 VITALS — BP 100/55 | HR 65 | Wt 126.8 lb

## 2013-01-30 DIAGNOSIS — E785 Hyperlipidemia, unspecified: Secondary | ICD-10-CM | POA: Insufficient documentation

## 2013-01-30 DIAGNOSIS — E119 Type 2 diabetes mellitus without complications: Secondary | ICD-10-CM | POA: Diagnosis not present

## 2013-01-30 DIAGNOSIS — C7A098 Malignant carcinoid tumors of other sites: Secondary | ICD-10-CM

## 2013-01-30 DIAGNOSIS — I5022 Chronic systolic (congestive) heart failure: Secondary | ICD-10-CM | POA: Diagnosis not present

## 2013-01-30 DIAGNOSIS — I359 Nonrheumatic aortic valve disorder, unspecified: Secondary | ICD-10-CM

## 2013-01-30 DIAGNOSIS — I5032 Chronic diastolic (congestive) heart failure: Secondary | ICD-10-CM | POA: Insufficient documentation

## 2013-01-30 DIAGNOSIS — D490 Neoplasm of unspecified behavior of digestive system: Secondary | ICD-10-CM | POA: Diagnosis not present

## 2013-01-30 MED ORDER — IOHEXOL 300 MG/ML  SOLN
100.0000 mL | Freq: Once | INTRAMUSCULAR | Status: AC | PRN
  Administered 2013-01-30: 100 mL via INTRAVENOUS

## 2013-01-30 NOTE — Progress Notes (Signed)
Patient ID: [name], male   DOB: Jan 25, 1941, 72 y.o.   MRN: [id number] HPI:  [name] is 72 y/o male with h/o moderate-severe AS, DM2 and hyperlipidemia.    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 Lattie Corns. Had follow up CT this am.  If tumor progressing will start injections, ocreotide.    Echo in March 2012 showed stable aortic stensosis in the moderate range (mean 25mm HG was 23 in 3/11) with normal LV function.+ pseudonormal filling  Echo 08/2011 EF normal AS mean gradient up to 35.   12/16/12 Echo: EF normal but distal septum hypokinetic.  AS mean gradient up to 39. AVA 0.7  RHC/LHC 12/25/13  RA = 4  RV = 31/3/7  PA = 31/8 (18)  PCW = 9  Fick cardiac output/index = 4.2/2.7  PVR = 1.0 Woods  FA sat = 95%  PA sat = 68%, 73%  Ao Pressure: 135/58 (89)  LV Pressure: 166/8/16  Simultaneous LV-AO gradient: 31 mm HG (peak to peak) (mean)  AVA 0.78cm2 1. Normal coronary arteries except for a fistulous connection between the first diagonal and what appears to be the pulmonary vasculature  2. Normal intracardiac pressures  3. Moderate to severe aortic stenosis    01/28/13 Potassium 4.0 Creatinine 1.0   He returns for follow up today. Plans for AVR March 03, 2013 by Dr Laneta Simmers. Denies SOB/PND/Orthopnea. Weight at home 121-122 pounds. Has not been watching his sodium. Lasix increased last week from 40 daily to 40/20. + edema. Occasional feels dizziness when standing up. No syncope.   CT today:  1. Stable calcified mesenteric mass compared to the prior CT.  There is adjacent mesenteric vascular congestion present.  2. Stable liver lesions as noted previously. No new abnormality.  3. Stable enhancing focus in the posterior aspect of the spleen  probably represents hemangioma.       ROS: All systems negative except as listed in HPI, PMH and Problem  List.  Past Medical History  Diagnosis Date  . Aortic stenosis     moderate. Echo 3/10: EF 55-60%. mean gradient 25 mmHg. AVA 1.23cm2. Echo 3/11: Normal EF. mean AVA gradient . exercise myoview 2008: positive ECG. normal perfusion  . Exposure to secondhand smoke     extensive exposure  . Hyperlipidemia     treated  . Depression   . Asthma   . DM2 (diabetes mellitus, type 2)   . Heart murmur   . Testicular cancer     s/p XRT and surgical resection.  . Chronic lymphocytic leukemia     followed by Dr. Candyce Churn  . LBBB (left bundle branch block)     Current Outpatient Prescriptions  Medication Sig Dispense Refill  . acetaminophen (TYLENOL) 500 MG tablet Take 500 mg by mouth 3 (three) times daily.       Marland Kitchen aspirin EC 81 MG tablet Take 81 mg by mouth daily.      Marland Kitchen atorvastatin (LIPITOR) 40 MG tablet Take 40 mg by mouth daily.      . busPIRone (BUSPAR) 15 MG tablet Take 15-30 mg by mouth 2 (two) times daily. One tablet every morning and two tablets every day at noon      . Cholestyramine POWD Take 1 scoop by mouth 2 (two) times daily.       . cycloSPORINE (RESTASIS) 0.05 % ophthalmic emulsion Place 1 drop into  both eyes 2 (two) times daily.       Marland Kitchen desloratadine (CLARINEX) 5 MG tablet Take 5 mg by mouth daily.        . fenofibrate 160 MG tablet Take 160 mg by mouth daily.        . fluticasone (FLONASE) 50 MCG/ACT nasal spray Place 2 sprays into the nose daily.       Marland Kitchen FORA V12 BLOOD GLUCOSE TEST test strip       . furosemide (LASIX) 20 MG tablet Take 2 tabs in AM and 1 tab in PM  90 tablet  6  . gabapentin (NEURONTIN) 300 MG capsule Take 300 mg by mouth 2 (two) times daily. One capsule in the morning and 2 capsules at noon      . LITETOUCH LANCETS MISC       . Multiple Vitamin (MULTIVITAMIN) tablet Take 1 tablet by mouth daily.        . potassium chloride SA (K-DUR,KLOR-CON) 20 MEQ tablet Take 1 tablet (20 mEq total) by mouth daily.  30 tablet  6  . PROCTOZONE-HC 2.5 % rectal  cream Place 1 application rectally 2 (two) times daily.       . sertraline (ZOLOFT) 100 MG tablet Take 100 mg by mouth daily.        . traZODone (DESYREL) 50 MG tablet Take 150 mg by mouth at bedtime.        No current facility-administered medications for this encounter.     PHYSICAL EXAM: Filed Vitals:   01/30/13 1021  BP: 100/55  Pulse: 65  Weight: 126 lb 12.8 oz (57.516 kg)  SpO2: 98%    General:  well appearing. no resp difficulty HEENT: normal Neck: supple. JVP 6-7. Carotids mildly delayed with bilat radiated bruits.  Cor: PMI nondisplaced. Regular rate & rhythm. No rubs, gallops. 3/6 mid-peaking AS murmur. S2 decreased but audible Lungs: clear Abdomen: soft, nontender, nondistended. No hepatosplenomegaly. No bruits or masses. Good bowel sounds. Extremities: no cyanosis, clubbing, rash, tr-1+ ankle edema Neuro: alert & orientedx3, cranial nerves grossly intact. moves all 4 extremities w/o difficulty. affect pleasant      ASSESSMENT & PLAN:

## 2013-01-30 NOTE — Assessment & Plan Note (Signed)
Stable. Mild fluid overload. Reinforced need for daily weights and reviewed use of sliding scale diuretics. Scheduled for AVR on 7/7. Will need octreotide infusion during surgery and injections post-op. Will coordinate with [facility].

## 2013-01-30 NOTE — Assessment & Plan Note (Signed)
Mild volume overload. Reinforced need for daily weights and reviewed use of sliding scale diuretics.

## 2013-02-03 ENCOUNTER — Other Ambulatory Visit: Payer: Self-pay | Admitting: *Deleted

## 2013-02-03 DIAGNOSIS — I359 Nonrheumatic aortic valve disorder, unspecified: Secondary | ICD-10-CM

## 2013-02-06 ENCOUNTER — Encounter (HOSPITAL_COMMUNITY): Payer: Self-pay | Admitting: Pharmacy Technician

## 2013-02-10 DIAGNOSIS — D3A00 Benign carcinoid tumor of unspecified site: Secondary | ICD-10-CM | POA: Diagnosis not present

## 2013-02-13 ENCOUNTER — Encounter (HOSPITAL_COMMUNITY)
Admission: RE | Admit: 2013-02-13 | Discharge: 2013-02-13 | Disposition: A | Discharge status: Home / Self Care | Payer: Medicare Other | Source: Ambulatory Visit | Attending: Surgery | Admitting: Surgery

## 2013-02-13 ENCOUNTER — Encounter (HOSPITAL_COMMUNITY): Payer: Self-pay

## 2013-02-13 ENCOUNTER — Ambulatory Visit (HOSPITAL_COMMUNITY)
Admission: RE | Admit: 2013-02-13 | Discharge: 2013-02-13 | Disposition: A | Discharge status: Home / Self Care | Payer: Medicare Other | Source: Ambulatory Visit | Attending: Surgery | Admitting: Surgery

## 2013-02-13 DIAGNOSIS — I359 Nonrheumatic aortic valve disorder, unspecified: Secondary | ICD-10-CM

## 2013-02-13 DIAGNOSIS — Z01811 Encounter for preprocedural respiratory examination: Secondary | ICD-10-CM | POA: Diagnosis not present

## 2013-02-13 DIAGNOSIS — Z01818 Encounter for other preprocedural examination: Secondary | ICD-10-CM | POA: Diagnosis not present

## 2013-02-13 DIAGNOSIS — Z01812 Encounter for preprocedural laboratory examination: Secondary | ICD-10-CM | POA: Insufficient documentation

## 2013-02-13 DIAGNOSIS — Z01810 Encounter for preprocedural cardiovascular examination: Secondary | ICD-10-CM | POA: Diagnosis not present

## 2013-02-13 LAB — URINALYSIS, ROUTINE W REFLEX MICROSCOPIC
Glucose, UA: NEGATIVE mg/dL
Ketones, ur: NEGATIVE mg/dL
Leukocytes, UA: NEGATIVE
Nitrite: NEGATIVE
Protein, ur: NEGATIVE mg/dL
pH: 7 (ref 5.0–8.0)

## 2013-02-13 LAB — COMPREHENSIVE METABOLIC PANEL
ALT: 18 U/L (ref 0–53)
Alkaline Phosphatase: 59 U/L (ref 39–117)
BUN: 20 mg/dL (ref 6–23)
CO2: 23 mEq/L (ref 19–32)
Chloride: 104 mEq/L (ref 96–112)
GFR calc Af Amer: >90 mL/min (ref >90)
Glucose, Bld: 117 mg/dL — ABNORMAL HIGH (ref 70–99)
Potassium: 4 mEq/L (ref 3.5–5.1)
Sodium: 137 mEq/L (ref 135–145)
Total Bilirubin: 0.3 mg/dL (ref 0.3–1.2)

## 2013-02-13 LAB — PULMONARY FUNCTION TEST

## 2013-02-13 LAB — BLOOD GAS, ARTERIAL
Bicarbonate: 26.4 mEq/L — ABNORMAL HIGH (ref 20.0–24.0)
Patient temperature: 98.6
TCO2: 27.8 mmol/L (ref 0–100)
pCO2 arterial: 45.9 mmHg — ABNORMAL HIGH (ref 35.0–45.0)
pH, Arterial: 7.378 (ref 7.350–7.450)
pO2, Arterial: 95.6 mmHg (ref 80.0–100.0)

## 2013-02-13 LAB — CBC
HCT: 37.7 % — ABNORMAL LOW (ref 39.0–52.0)
Hemoglobin: 12.6 g/dL — ABNORMAL LOW (ref 13.0–17.0)
RBC: 3.93 MIL/uL — ABNORMAL LOW (ref 4.22–5.81)
WBC: 4.4 10*3/uL (ref 4.0–10.5)

## 2013-02-13 LAB — HEMOGLOBIN A1C: Hgb A1c MFr Bld: 5.9 % — ABNORMAL HIGH (ref <5.7)

## 2013-02-13 LAB — TYPE AND SCREEN

## 2013-02-13 LAB — ABO/RH: ABO/RH(D): A NEG

## 2013-02-13 LAB — SURGICAL PCR SCREEN: MRSA, PCR: NEGATIVE

## 2013-02-13 MED ORDER — ALBUTEROL SULFATE (5 MG/ML) 0.5% IN NEBU
2.5000 mg | INHALATION_SOLUTION | Freq: Once | RESPIRATORY_TRACT | Status: AC
  Administered 2013-02-13: 2.5 mg via RESPIRATORY_TRACT
  Filled 2013-02-13: qty 0.5

## 2013-02-13 NOTE — Progress Notes (Signed)
VASCULAR LAB PRELIMINARY  PRELIMINARY  PRELIMINARY  PRELIMINARY  Pre-op Cardiac Surgery  Carotid Findings:  Bilateral:  Less than 39% ICA stenosis.  Vertebral artery flow is antegrade.      Upper Extremity Right Left  Brachial Pressures 101 Triphasic  104  Triphasic   Radial Waveforms  Triphasic   Triphasic   Ulnar Waveforms  Triphasic   Triphasic   Palmar Arch (Allen's Test) Within normal limits  Within normal limits      Chadd Tollison, RVT 02/13/2013, 2:16 PM

## 2013-02-13 NOTE — Pre-Procedure Instructions (Signed)
[name]  02/13/2013   Your procedure is scheduled on:  Monday, June 23rd  Report to [facility] Short Stay Center at 0530 AM. Come to main entrance "A" and go to east elevators up to 3rd floor. Check in at short stay desk.  Call this number if you have problems the morning of surgery: [phone]   Remember:   Do not eat food or drink liquids after midnight.   Take these medicines the morning of surgery with A SIP OF WATER: flonase   Do not wear jewelry.  Do not wear lotions, powders, or perfume, deodorant.  Do not shave 48 hours prior to surgery. Men may shave face and neck.  Do not bring valuables to the hospital.  [facility] is not responsible   for any belongings or valuables.  Contacts, dentures or bridgework may not be worn into surgery.  Leave suitcase in the car. After surgery it may be brought to your room.  For patients admitted to the hospital, checkout time is 11:00 AM the day of discharge.   Patients discharged the day of surgery will not be allowed to drive home.    Special Instructions: Shower using CHG 2 nights before surgery and the night before surgery.  If you shower the day of surgery use CHG.  Use special wash - you have one bottle of CHG for all showers.  You should use approximately 1/3 of the bottle for each shower.   Please read over the following fact sheets that you were given: Pain Booklet, Coughing and Deep Breathing, Blood Transfusion Information, Open Heart Packet, MRSA Information and Surgical Site Infection Prevention

## 2013-02-13 NOTE — Progress Notes (Signed)
Primary physician - Dr. Johnn Hai Cardiologist - dr. Vevelyn Royals, echo, cath in epic

## 2013-02-14 NOTE — Progress Notes (Signed)
Anesthesia Chart Review:  Patient is a 72 year old male scheduled for AVR on 02/17/13 by Dr. Laneta Simmers.  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 Lattie Corns.  According to Dr. Sharee Pimple note from 01/15/13, he was planning to use somatostatin infusion during the procedure due to his hx of an abdominal neuroendocrine tumor if that is recommended.  Patient was going to discuss further with Dr. Lattie Corns.  PCP is Dr. Mila Palmer.  Cardiologist is Dr. Gala Romney.  Cardiac cath on 12/25/12 showed: 1. Normal coronary arteries except for a fistulous connection between the first diagonal and what appears to be the pulmonary vasculature  2. Normal intracardiac pressures  3. Moderate to severe aortic stenosis   2D echo on 12/16/12 showed: - Left ventricle: LVEF Is approximately 55 to 60% with hypokinesis of the distal inferoseptal wall. The cavity size was normal. Wall thickness was increased in a pattern of mild LVH. Doppler parameters are consistent with abnormal left ventricular relaxation (grade 1 diastolic dysfunction). - Aortic valve: AV is difficult to see. It is thickened, calcified with restricted motion. Peak and mean gradients through valve are50 and 32 mm Hg respectively consistent with moderate AS.  EKG on 02/13/13 showed NSR, left BBB which was also present on his previous EKG on 12/25/12.  Preliminary carotid duplex on 02/13/13 showed < 39% ICAS bilaterally.  CXR on 02/13/13 showed no active disease.  Hyperinflation.  PFTs on 02/13/13 showed FEV1 1.56 (68%).  Preoperative labs noted.  Anticipate that he can proceed as planned.  Velna Ochs [facility] Short Stay Center/Anesthesiology Phone [phone] 02/14/2013 9:34 AM

## 2013-02-16 MED ORDER — PLASMA-LYTE 148 IV SOLN
INTRAVENOUS | Status: AC
  Administered 2013-02-17: 08:00:00
  Filled 2013-02-16: qty 2.5

## 2013-02-16 MED ORDER — PHENYLEPHRINE HCL 10 MG/ML IJ SOLN
30.0000 ug/min | INTRAMUSCULAR | Status: DC
  Filled 2013-02-16: qty 2

## 2013-02-16 MED ORDER — SODIUM CHLORIDE 0.9 % IV SOLN
INTRAVENOUS | Status: DC
  Filled 2013-02-16: qty 30

## 2013-02-16 MED ORDER — EPINEPHRINE HCL 1 MG/ML IJ SOLN
0.5000 ug/min | INTRAVENOUS | Status: DC
  Filled 2013-02-16: qty 4

## 2013-02-16 MED ORDER — MAGNESIUM SULFATE 50 % IJ SOLN
40.0000 meq | INTRAMUSCULAR | Status: DC
  Filled 2013-02-16: qty 10

## 2013-02-16 MED ORDER — SODIUM CHLORIDE 0.9 % IV SOLN
INTRAVENOUS | Status: AC
  Administered 2013-02-17: 1 [IU]/h via INTRAVENOUS
  Filled 2013-02-16: qty 1

## 2013-02-16 MED ORDER — DEXMEDETOMIDINE HCL IN NACL 400 MCG/100ML IV SOLN
0.1000 ug/kg/h | INTRAVENOUS | Status: AC
  Administered 2013-02-17: .2 ug/kg/h via INTRAVENOUS
  Filled 2013-02-16: qty 100

## 2013-02-16 MED ORDER — SODIUM CHLORIDE 0.9 % IV SOLN
INTRAVENOUS | Status: AC
  Administered 2013-02-17: 70 mL/h via INTRAVENOUS
  Filled 2013-02-16: qty 40

## 2013-02-16 MED ORDER — LEVOFLOXACIN IN D5W 500 MG/100ML IV SOLN
500.0000 mg | INTRAVENOUS | Status: AC
  Administered 2013-02-17: 500 mg via INTRAVENOUS
  Filled 2013-02-16: qty 100

## 2013-02-16 MED ORDER — POTASSIUM CHLORIDE 2 MEQ/ML IV SOLN
80.0000 meq | INTRAVENOUS | Status: DC
  Filled 2013-02-16: qty 40

## 2013-02-16 MED ORDER — VANCOMYCIN HCL 10 G IV SOLR
1250.0000 mg | INTRAVENOUS | Status: AC
  Administered 2013-02-17: 1250 mg via INTRAVENOUS
  Filled 2013-02-16: qty 1250

## 2013-02-16 MED ORDER — DOPAMINE-DEXTROSE 3.2-5 MG/ML-% IV SOLN
2.0000 ug/kg/min | INTRAVENOUS | Status: DC
  Filled 2013-02-16: qty 250

## 2013-02-16 MED ORDER — NITROGLYCERIN IN D5W 200-5 MCG/ML-% IV SOLN
2.0000 ug/min | INTRAVENOUS | Status: AC
  Administered 2013-02-17: 3 ug/min via INTRAVENOUS
  Filled 2013-02-16: qty 250

## 2013-02-17 ENCOUNTER — Inpatient Hospital Stay (HOSPITAL_COMMUNITY): Payer: Medicare Other

## 2013-02-17 ENCOUNTER — Encounter (HOSPITAL_COMMUNITY)
Admission: RE | Disposition: A | Discharge status: Home / Self Care | Payer: Self-pay | Source: Ambulatory Visit | Attending: Surgery

## 2013-02-17 ENCOUNTER — Inpatient Hospital Stay (HOSPITAL_COMMUNITY)
Admission: RE | Admit: 2013-02-17 | Discharge: 2013-02-22 | DRG: 220 | Disposition: A | Discharge status: Home / Self Care | Payer: Medicare Other | Source: Ambulatory Visit | Attending: Surgery | Admitting: Surgery

## 2013-02-17 ENCOUNTER — Encounter (HOSPITAL_COMMUNITY): Payer: Self-pay | Admitting: Vascular Surgery

## 2013-02-17 ENCOUNTER — Encounter (HOSPITAL_COMMUNITY): Payer: Self-pay | Admitting: *Deleted

## 2013-02-17 ENCOUNTER — Ambulatory Visit (HOSPITAL_COMMUNITY): Payer: Medicare Other | Admitting: Anesthesiology

## 2013-02-17 DIAGNOSIS — I447 Left bundle-branch block, unspecified: Secondary | ICD-10-CM | POA: Diagnosis present

## 2013-02-17 DIAGNOSIS — D62 Acute posthemorrhagic anemia: Secondary | ICD-10-CM | POA: Diagnosis not present

## 2013-02-17 DIAGNOSIS — D3A8 Other benign neuroendocrine tumors: Secondary | ICD-10-CM

## 2013-02-17 DIAGNOSIS — I517 Cardiomegaly: Secondary | ICD-10-CM | POA: Diagnosis not present

## 2013-02-17 DIAGNOSIS — J90 Pleural effusion, not elsewhere classified: Secondary | ICD-10-CM | POA: Diagnosis not present

## 2013-02-17 DIAGNOSIS — Z8547 Personal history of malignant neoplasm of testis: Secondary | ICD-10-CM

## 2013-02-17 DIAGNOSIS — Z950 Presence of cardiac pacemaker: Secondary | ICD-10-CM

## 2013-02-17 DIAGNOSIS — Z952 Presence of prosthetic heart valve: Secondary | ICD-10-CM

## 2013-02-17 DIAGNOSIS — I5032 Chronic diastolic (congestive) heart failure: Secondary | ICD-10-CM | POA: Diagnosis present

## 2013-02-17 DIAGNOSIS — E785 Hyperlipidemia, unspecified: Secondary | ICD-10-CM | POA: Diagnosis present

## 2013-02-17 DIAGNOSIS — E119 Type 2 diabetes mellitus without complications: Secondary | ICD-10-CM | POA: Diagnosis present

## 2013-02-17 DIAGNOSIS — D3A00 Benign carcinoid tumor of unspecified site: Secondary | ICD-10-CM | POA: Diagnosis not present

## 2013-02-17 DIAGNOSIS — F3289 Other specified depressive episodes: Secondary | ICD-10-CM | POA: Diagnosis present

## 2013-02-17 DIAGNOSIS — T59891A Toxic effect of other specified gases, fumes and vapors, accidental (unintentional), initial encounter: Secondary | ICD-10-CM | POA: Diagnosis present

## 2013-02-17 DIAGNOSIS — D3A098 Benign carcinoid tumors of other sites: Secondary | ICD-10-CM | POA: Diagnosis present

## 2013-02-17 DIAGNOSIS — T59811A Toxic effect of smoke, accidental (unintentional), initial encounter: Secondary | ICD-10-CM | POA: Diagnosis present

## 2013-02-17 DIAGNOSIS — I359 Nonrheumatic aortic valve disorder, unspecified: Principal | ICD-10-CM | POA: Diagnosis present

## 2013-02-17 DIAGNOSIS — F329 Major depressive disorder, single episode, unspecified: Secondary | ICD-10-CM | POA: Diagnosis present

## 2013-02-17 DIAGNOSIS — C9110 Chronic lymphocytic leukemia of B-cell type not having achieved remission: Secondary | ICD-10-CM | POA: Diagnosis present

## 2013-02-17 DIAGNOSIS — J45909 Unspecified asthma, uncomplicated: Secondary | ICD-10-CM | POA: Diagnosis present

## 2013-02-17 DIAGNOSIS — IMO0001 Reserved for inherently not codable concepts without codable children: Secondary | ICD-10-CM | POA: Diagnosis not present

## 2013-02-17 DIAGNOSIS — I442 Atrioventricular block, complete: Secondary | ICD-10-CM | POA: Diagnosis not present

## 2013-02-17 DIAGNOSIS — R159 Full incontinence of feces: Secondary | ICD-10-CM | POA: Diagnosis present

## 2013-02-17 DIAGNOSIS — I350 Nonrheumatic aortic (valve) stenosis: Secondary | ICD-10-CM

## 2013-02-17 DIAGNOSIS — J9819 Other pulmonary collapse: Secondary | ICD-10-CM | POA: Diagnosis not present

## 2013-02-17 DIAGNOSIS — R0602 Shortness of breath: Secondary | ICD-10-CM | POA: Diagnosis not present

## 2013-02-17 HISTORY — PX: AORTIC VALVE REPLACEMENT: SHX41

## 2013-02-17 HISTORY — PX: INTRAOPERATIVE TRANSESOPHAGEAL ECHOCARDIOGRAM: SHX5062

## 2013-02-17 LAB — POCT I-STAT 3, ART BLOOD GAS (G3+)
Acid-base deficit: 2 mmol/L (ref 0.0–2.0)
Acid-base deficit: 4 mmol/L — ABNORMAL HIGH (ref 0.0–2.0)
Bicarbonate: 25.2 mEq/L — ABNORMAL HIGH (ref 20.0–24.0)
Bicarbonate: 23.3 mEq/L (ref 20.0–24.0)
O2 Saturation: 100 %
Patient temperature: 38
TCO2: 27 mmol/L (ref 0–100)
TCO2: 24 mmol/L (ref 0–100)
pCO2 arterial: 49.8 mmHg — ABNORMAL HIGH (ref 35.0–45.0)
pCO2 arterial: 35.8 mmHg (ref 35.0–45.0)
pH, Arterial: 7.312 — ABNORMAL LOW (ref 7.350–7.450)
pH, Arterial: 7.476 — ABNORMAL HIGH (ref 7.350–7.450)
pO2, Arterial: 325 mmHg — ABNORMAL HIGH (ref 80.0–100.0)
pO2, Arterial: 210 mmHg — ABNORMAL HIGH (ref 80.0–100.0)
pO2, Arterial: 123 mmHg — ABNORMAL HIGH (ref 80.0–100.0)

## 2013-02-17 LAB — POCT I-STAT 4, (NA,K, GLUC, HGB,HCT)
Glucose, Bld: 99 mg/dL (ref 70–99)
Glucose, Bld: 109 mg/dL — ABNORMAL HIGH (ref 70–99)
Glucose, Bld: 91 mg/dL (ref 70–99)
Glucose, Bld: 82 mg/dL (ref 70–99)
HCT: 31 % — ABNORMAL LOW (ref 39.0–52.0)
HCT: 27 % — ABNORMAL LOW (ref 39.0–52.0)
HCT: 25 % — ABNORMAL LOW (ref 39.0–52.0)
Hemoglobin: 10.9 g/dL — ABNORMAL LOW (ref 13.0–17.0)
Hemoglobin: 8.5 g/dL — ABNORMAL LOW (ref 13.0–17.0)
Hemoglobin: 9.2 g/dL — ABNORMAL LOW (ref 13.0–17.0)
Potassium: 3.6 mEq/L (ref 3.5–5.1)
Potassium: 4.9 mEq/L (ref 3.5–5.1)
Potassium: 4.2 mEq/L (ref 3.5–5.1)
Sodium: 143 mEq/L (ref 135–145)
Sodium: 143 mEq/L (ref 135–145)
Sodium: 143 mEq/L (ref 135–145)

## 2013-02-17 LAB — GLUCOSE, CAPILLARY
Glucose-Capillary: 108 mg/dL — ABNORMAL HIGH (ref 70–99)
Glucose-Capillary: 96 mg/dL (ref 70–99)
Glucose-Capillary: 89 mg/dL (ref 70–99)
Glucose-Capillary: 109 mg/dL — ABNORMAL HIGH (ref 70–99)

## 2013-02-17 LAB — CBC
HCT: 28.8 % — ABNORMAL LOW (ref 39.0–52.0)
HCT: 31.2 % — ABNORMAL LOW (ref 39.0–52.0)
Hemoglobin: 9.9 g/dL — ABNORMAL LOW (ref 13.0–17.0)
MCH: 32.7 pg (ref 26.0–34.0)
MCHC: 34 g/dL (ref 30.0–36.0)
MCV: 96.3 fL (ref 78.0–100.0)
Platelets: 173 10*3/uL (ref 150–400)
RDW: 12.5 % (ref 11.5–15.5)
RDW: 12.8 % (ref 11.5–15.5)
WBC: 3.9 10*3/uL — ABNORMAL LOW (ref 4.0–10.5)
WBC: 10.8 10*3/uL — ABNORMAL HIGH (ref 4.0–10.5)

## 2013-02-17 LAB — PROTIME-INR
INR: 1.49 (ref 0.00–1.49)
Prothrombin Time: 17.6 seconds — ABNORMAL HIGH (ref 11.6–15.2)

## 2013-02-17 LAB — PLATELET COUNT: Platelets: 131 10*3/uL — ABNORMAL LOW (ref 150–400)

## 2013-02-17 LAB — POCT I-STAT, CHEM 8
Calcium, Ion: 1.23 mmol/L (ref 1.13–1.30)
Glucose, Bld: 149 mg/dL — ABNORMAL HIGH (ref 70–99)
HCT: 31 % — ABNORMAL LOW (ref 39.0–52.0)
Hemoglobin: 10.5 g/dL — ABNORMAL LOW (ref 13.0–17.0)

## 2013-02-17 LAB — APTT: aPTT: 40 seconds — ABNORMAL HIGH (ref 24–37)

## 2013-02-17 SURGERY — REPLACEMENT, AORTIC VALVE, OPEN
Anesthesia: General | Site: Chest | Wound class: Clean

## 2013-02-17 MED ORDER — CHLORHEXIDINE GLUCONATE 4 % EX LIQD: 30.0000 mL | CUTANEOUS | Status: DC

## 2013-02-17 MED ORDER — MIDAZOLAM HCL 2 MG/2ML IJ SOLN: 2.0000 mg | INTRAMUSCULAR | Status: DC | PRN

## 2013-02-17 MED ORDER — PROPOFOL 10 MG/ML IV BOLUS
INTRAVENOUS | Status: DC | PRN
  Administered 2013-02-17: 100 mg via INTRAVENOUS

## 2013-02-17 MED ORDER — BISACODYL 10 MG RE SUPP: 10.0000 mg | Freq: Every day | RECTAL | Status: DC

## 2013-02-17 MED ORDER — SODIUM CHLORIDE 0.9 % IJ SOLN
OROMUCOSAL | Status: DC | PRN
  Administered 2013-02-17: 07:00:00 via TOPICAL

## 2013-02-17 MED ORDER — HEPARIN SODIUM (PORCINE) 1000 UNIT/ML IJ SOLN
INTRAMUSCULAR | Status: DC | PRN
  Administered 2013-02-17: 20000 [IU] via INTRAVENOUS

## 2013-02-17 MED ORDER — LIDOCAINE HCL (CARDIAC) 20 MG/ML IV SOLN
INTRAVENOUS | Status: DC | PRN
  Administered 2013-02-17: 100 mg via INTRAVENOUS

## 2013-02-17 MED ORDER — POTASSIUM CHLORIDE 10 MEQ/50ML IV SOLN: 10.0000 meq | INTRAVENOUS | Status: AC

## 2013-02-17 MED ORDER — ACETAMINOPHEN 160 MG/5ML PO SOLN
975.0000 mg | Freq: Four times a day (QID) | ORAL | Status: DC
  Filled 2013-02-17: qty 40.6

## 2013-02-17 MED ORDER — FENTANYL CITRATE 0.05 MG/ML IJ SOLN
INTRAMUSCULAR | Status: DC | PRN
  Administered 2013-02-17: 50 ug via INTRAVENOUS
  Administered 2013-02-17: 150 ug via INTRAVENOUS
  Administered 2013-02-17: 100 ug via INTRAVENOUS
  Administered 2013-02-17 (×4): 150 ug via INTRAVENOUS
  Administered 2013-02-17: 100 ug via INTRAVENOUS
  Administered 2013-02-17: 200 ug via INTRAVENOUS
  Administered 2013-02-17: 150 ug via INTRAVENOUS
  Administered 2013-02-17: 100 ug via INTRAVENOUS
  Administered 2013-02-17: 150 ug via INTRAVENOUS
  Administered 2013-02-17: 200 ug via INTRAVENOUS
  Administered 2013-02-17 (×2): 100 ug via INTRAVENOUS

## 2013-02-17 MED ORDER — PANTOPRAZOLE SODIUM 40 MG PO TBEC
40.0000 mg | DELAYED_RELEASE_TABLET | Freq: Every day | ORAL | Status: DC
  Administered 2013-02-19 – 2013-02-20 (×2): 40 mg via ORAL
  Filled 2013-02-17 (×3): qty 1

## 2013-02-17 MED ORDER — ONDANSETRON HCL 4 MG/2ML IJ SOLN: 4.0000 mg | Freq: Four times a day (QID) | INTRAMUSCULAR | Status: DC | PRN

## 2013-02-17 MED ORDER — SODIUM CHLORIDE 0.9 % IV SOLN
INTRAVENOUS | Status: DC
  Filled 2013-02-17: qty 1

## 2013-02-17 MED ORDER — SERTRALINE HCL 100 MG PO TABS
100.0000 mg | ORAL_TABLET | Freq: Every day | ORAL | Status: DC
  Administered 2013-02-18 – 2013-02-22 (×5): 100 mg via ORAL
  Filled 2013-02-17 (×5): qty 1

## 2013-02-17 MED ORDER — SODIUM CHLORIDE 0.45 % IV SOLN
INTRAVENOUS | Status: DC
  Administered 2013-02-17: 20 mL/h via INTRAVENOUS

## 2013-02-17 MED ORDER — ACETAMINOPHEN 500 MG PO TABS
1000.0000 mg | ORAL_TABLET | Freq: Four times a day (QID) | ORAL | Status: DC
  Administered 2013-02-18 – 2013-02-20 (×7): 1000 mg via ORAL
  Filled 2013-02-17 (×14): qty 2

## 2013-02-17 MED ORDER — LEVOFLOXACIN IN D5W 750 MG/150ML IV SOLN
750.0000 mg | Freq: Once | INTRAVENOUS | Status: AC
  Administered 2013-02-18: 750 mg via INTRAVENOUS
  Filled 2013-02-17: qty 150

## 2013-02-17 MED ORDER — METOPROLOL TARTRATE 12.5 MG HALF TABLET
12.5000 mg | ORAL_TABLET | Freq: Two times a day (BID) | ORAL | Status: DC
  Filled 2013-02-17 (×3): qty 1

## 2013-02-17 MED ORDER — DEXMEDETOMIDINE HCL IN NACL 200 MCG/50ML IV SOLN: 0.1000 ug/kg/h | INTRAVENOUS | Status: DC

## 2013-02-17 MED ORDER — ASPIRIN EC 325 MG PO TBEC
325.0000 mg | DELAYED_RELEASE_TABLET | Freq: Every day | ORAL | Status: DC
  Administered 2013-02-18 – 2013-02-20 (×3): 325 mg via ORAL
  Filled 2013-02-17 (×3): qty 1

## 2013-02-17 MED ORDER — CHOLESTYRAMINE LIGHT 4 G PO PACK
4.0000 g | PACK | Freq: Two times a day (BID) | ORAL | Status: DC
  Administered 2013-02-18 – 2013-02-22 (×5): 4 g via ORAL
  Filled 2013-02-17 (×11): qty 1

## 2013-02-17 MED ORDER — FAMOTIDINE IN NACL 20-0.9 MG/50ML-% IV SOLN
20.0000 mg | Freq: Two times a day (BID) | INTRAVENOUS | Status: DC
  Administered 2013-02-17: 20 mg via INTRAVENOUS

## 2013-02-17 MED ORDER — CHOLESTYRAMINE POWD: 1.0000 | Freq: Two times a day (BID) | Status: DC

## 2013-02-17 MED ORDER — FENOFIBRATE 160 MG PO TABS
160.0000 mg | ORAL_TABLET | Freq: Every day | ORAL | Status: DC
  Administered 2013-02-18 – 2013-02-22 (×5): 160 mg via ORAL
  Filled 2013-02-17 (×5): qty 1

## 2013-02-17 MED ORDER — INSULIN ASPART 100 UNIT/ML ~~LOC~~ SOLN
0.0000 [IU] | SUBCUTANEOUS | Status: AC
  Administered 2013-02-17: 2 [IU] via SUBCUTANEOUS

## 2013-02-17 MED ORDER — SODIUM CHLORIDE 0.9 % IJ SOLN: 3.0000 mL | INTRAMUSCULAR | Status: DC | PRN

## 2013-02-17 MED ORDER — SODIUM CHLORIDE 0.9 % IV SOLN
25.0000 ug/h | INTRAVENOUS | Status: DC
  Administered 2013-02-17: 100 ug/h via INTRAVENOUS

## 2013-02-17 MED ORDER — METOPROLOL TARTRATE 12.5 MG HALF TABLET
12.5000 mg | ORAL_TABLET | Freq: Once | ORAL | Status: AC
  Administered 2013-02-17: 12.5 mg via ORAL
  Filled 2013-02-17: qty 1

## 2013-02-17 MED ORDER — OXYCODONE HCL 5 MG PO TABS
5.0000 mg | ORAL_TABLET | ORAL | Status: DC | PRN
  Administered 2013-02-18 – 2013-02-19 (×4): 5 mg via ORAL
  Filled 2013-02-17 (×5): qty 1

## 2013-02-17 MED ORDER — MORPHINE SULFATE 2 MG/ML IJ SOLN
2.0000 mg | INTRAMUSCULAR | Status: DC | PRN
  Administered 2013-02-17: 2 mg via INTRAVENOUS
  Administered 2013-02-18: 4 mg via INTRAVENOUS
  Administered 2013-02-18: 2 mg via INTRAVENOUS
  Filled 2013-02-17: qty 2
  Filled 2013-02-17: qty 1
  Filled 2013-02-17: qty 2

## 2013-02-17 MED ORDER — 0.9 % SODIUM CHLORIDE (POUR BTL) OPTIME
TOPICAL | Status: DC | PRN
  Administered 2013-02-17: 1000 mL

## 2013-02-17 MED ORDER — METOPROLOL TARTRATE 1 MG/ML IV SOLN: 2.5000 mg | INTRAVENOUS | Status: DC | PRN

## 2013-02-17 MED ORDER — INSULIN REGULAR BOLUS VIA INFUSION
0.0000 [IU] | Freq: Three times a day (TID) | INTRAVENOUS | Status: DC
  Filled 2013-02-17: qty 10

## 2013-02-17 MED ORDER — VANCOMYCIN HCL IN DEXTROSE 1-5 GM/200ML-% IV SOLN
1000.0000 mg | Freq: Once | INTRAVENOUS | Status: AC
  Administered 2013-02-17: 1000 mg via INTRAVENOUS
  Filled 2013-02-17: qty 200

## 2013-02-17 MED ORDER — GABAPENTIN 300 MG PO CAPS
300.0000 mg | ORAL_CAPSULE | Freq: Every day | ORAL | Status: DC
  Administered 2013-02-18 – 2013-02-22 (×5): 300 mg via ORAL
  Filled 2013-02-17 (×9): qty 1

## 2013-02-17 MED ORDER — ALBUMIN HUMAN 5 % IV SOLN
250.0000 mL | INTRAVENOUS | Status: AC | PRN
  Administered 2013-02-17 (×2): 250 mL via INTRAVENOUS
  Filled 2013-02-17 (×2): qty 250

## 2013-02-17 MED ORDER — MAGNESIUM SULFATE 40 MG/ML IJ SOLN
4.0000 g | Freq: Once | INTRAMUSCULAR | Status: AC
  Administered 2013-02-17: 4 g via INTRAVENOUS
  Filled 2013-02-17: qty 100

## 2013-02-17 MED ORDER — ASPIRIN 81 MG PO CHEW: 324.0000 mg | CHEWABLE_TABLET | Freq: Every day | ORAL | Status: DC

## 2013-02-17 MED ORDER — METOPROLOL TARTRATE 25 MG/10 ML ORAL SUSPENSION
12.5000 mg | Freq: Two times a day (BID) | ORAL | Status: DC
  Filled 2013-02-17 (×3): qty 5

## 2013-02-17 MED ORDER — INSULIN ASPART 100 UNIT/ML ~~LOC~~ SOLN: 0.0000 [IU] | SUBCUTANEOUS | Status: DC

## 2013-02-17 MED ORDER — BUSPIRONE HCL 15 MG PO TABS
30.0000 mg | ORAL_TABLET | Freq: Every day | ORAL | Status: DC
  Administered 2013-02-18 – 2013-02-22 (×4): 30 mg via ORAL
  Filled 2013-02-17 (×5): qty 2

## 2013-02-17 MED ORDER — LACTATED RINGERS IV SOLN
INTRAVENOUS | Status: DC | PRN
  Administered 2013-02-17: 07:00:00 via INTRAVENOUS

## 2013-02-17 MED ORDER — DOCUSATE SODIUM 100 MG PO CAPS
200.0000 mg | ORAL_CAPSULE | Freq: Every day | ORAL | Status: DC
  Administered 2013-02-18: 200 mg via ORAL
  Filled 2013-02-17: qty 2

## 2013-02-17 MED ORDER — SODIUM CHLORIDE 0.9 % IJ SOLN
3.0000 mL | Freq: Two times a day (BID) | INTRAMUSCULAR | Status: DC
  Administered 2013-02-18 – 2013-02-20 (×4): 3 mL via INTRAVENOUS

## 2013-02-17 MED ORDER — BISACODYL 5 MG PO TBEC
10.0000 mg | DELAYED_RELEASE_TABLET | Freq: Every day | ORAL | Status: DC
  Administered 2013-02-18: 10 mg via ORAL
  Filled 2013-02-17 (×2): qty 2

## 2013-02-17 MED ORDER — LACTATED RINGERS IV SOLN: 500.0000 mL | Freq: Once | INTRAVENOUS | Status: AC | PRN

## 2013-02-17 MED ORDER — ROCURONIUM BROMIDE 100 MG/10ML IV SOLN
INTRAVENOUS | Status: DC | PRN
  Administered 2013-02-17 (×2): 50 mg via INTRAVENOUS

## 2013-02-17 MED ORDER — SODIUM CHLORIDE 0.9 % IV SOLN
10.0000 mg | INTRAVENOUS | Status: DC | PRN
  Administered 2013-02-17: 25 ug/min via INTRAVENOUS

## 2013-02-17 MED ORDER — TRAZODONE HCL 150 MG PO TABS
150.0000 mg | ORAL_TABLET | Freq: Every day | ORAL | Status: DC
  Administered 2013-02-17 – 2013-02-21 (×5): 150 mg via ORAL
  Filled 2013-02-17 (×6): qty 1

## 2013-02-17 MED ORDER — ALBUMIN HUMAN 5 % IV SOLN
INTRAVENOUS | Status: DC | PRN
  Administered 2013-02-17 (×2): via INTRAVENOUS

## 2013-02-17 MED ORDER — PHENYLEPHRINE HCL 10 MG/ML IJ SOLN
0.0000 ug/min | INTRAMUSCULAR | Status: DC
  Filled 2013-02-17 (×2): qty 2

## 2013-02-17 MED ORDER — CYCLOSPORINE 0.05 % OP EMUL
1.0000 [drp] | Freq: Two times a day (BID) | OPHTHALMIC | Status: DC
  Administered 2013-02-17 – 2013-02-18 (×2): 1 [drp] via OPHTHALMIC
  Administered 2013-02-18: 11:00:00 via OPHTHALMIC
  Administered 2013-02-19 – 2013-02-22 (×7): 1 [drp] via OPHTHALMIC
  Filled 2013-02-17 (×11): qty 1

## 2013-02-17 MED ORDER — MIDAZOLAM HCL 5 MG/5ML IJ SOLN
INTRAMUSCULAR | Status: DC | PRN
  Administered 2013-02-17: 1 mg via INTRAVENOUS
  Administered 2013-02-17: 2 mg via INTRAVENOUS
  Administered 2013-02-17: 1 mg via INTRAVENOUS
  Administered 2013-02-17: 3 mg via INTRAVENOUS
  Administered 2013-02-17: 2 mg via INTRAVENOUS
  Administered 2013-02-17: 3 mg via INTRAVENOUS
  Administered 2013-02-17: 2 mg via INTRAVENOUS

## 2013-02-17 MED ORDER — HEMOSTATIC AGENTS (NO CHARGE) OPTIME
TOPICAL | Status: DC | PRN
  Administered 2013-02-17: 1 via TOPICAL

## 2013-02-17 MED ORDER — SODIUM CHLORIDE 0.9 % IV SOLN
INTRAVENOUS | Status: DC
  Administered 2013-02-17: 20 mL/h via INTRAVENOUS

## 2013-02-17 MED ORDER — NITROGLYCERIN IN D5W 200-5 MCG/ML-% IV SOLN: 0.0000 ug/min | INTRAVENOUS | Status: DC

## 2013-02-17 MED ORDER — ATORVASTATIN CALCIUM 40 MG PO TABS
40.0000 mg | ORAL_TABLET | Freq: Every day | ORAL | Status: DC
  Administered 2013-02-18 – 2013-02-22 (×5): 40 mg via ORAL
  Filled 2013-02-17 (×6): qty 1

## 2013-02-17 MED ORDER — ARTIFICIAL TEARS OP OINT
TOPICAL_OINTMENT | OPHTHALMIC | Status: DC | PRN
  Administered 2013-02-17: 1 via OPHTHALMIC

## 2013-02-17 MED ORDER — PROTAMINE SULFATE 10 MG/ML IV SOLN
INTRAVENOUS | Status: DC | PRN
  Administered 2013-02-17: 190 mg via INTRAVENOUS
  Administered 2013-02-17: 10 mg via INTRAVENOUS

## 2013-02-17 MED ORDER — SODIUM CHLORIDE 0.9 % IV SOLN: 250.0000 mL | INTRAVENOUS | Status: DC

## 2013-02-17 MED ORDER — LACTATED RINGERS IV SOLN
INTRAVENOUS | Status: DC
  Administered 2013-02-17: 20 mL/h via INTRAVENOUS
  Administered 2013-02-17: 16:00:00 via INTRAVENOUS

## 2013-02-17 MED ORDER — BUSPIRONE HCL 15 MG PO TABS
15.0000 mg | ORAL_TABLET | Freq: Every day | ORAL | Status: DC
  Administered 2013-02-18 – 2013-02-22 (×5): 15 mg via ORAL
  Filled 2013-02-17 (×6): qty 1

## 2013-02-17 MED ORDER — MORPHINE SULFATE 2 MG/ML IJ SOLN: 1.0000 mg | INTRAMUSCULAR | Status: AC | PRN

## 2013-02-17 MED ORDER — GABAPENTIN 300 MG PO CAPS
600.0000 mg | ORAL_CAPSULE | Freq: Every day | ORAL | Status: DC
  Administered 2013-02-18 – 2013-02-22 (×4): 600 mg via ORAL
  Filled 2013-02-17 (×5): qty 2

## 2013-02-17 MED ORDER — FLUTICASONE PROPIONATE 50 MCG/ACT NA SUSP
2.0000 | Freq: Every day | NASAL | Status: DC
  Administered 2013-02-18 – 2013-02-22 (×5): 2 via NASAL
  Filled 2013-02-17: qty 16

## 2013-02-17 MED ORDER — VECURONIUM BROMIDE 10 MG IV SOLR
INTRAVENOUS | Status: DC | PRN
  Administered 2013-02-17 (×2): 5 mg via INTRAVENOUS

## 2013-02-17 MED ORDER — ACETAMINOPHEN 10 MG/ML IV SOLN
1000.0000 mg | Freq: Once | INTRAVENOUS | Status: AC
  Administered 2013-02-17: 1000 mg via INTRAVENOUS
  Filled 2013-02-17: qty 100

## 2013-02-17 SURGICAL SUPPLY — 71 items
ADAPTER CARDIO PERF ANTE/RETRO (ADAPTER) ×3 IMPLANT
ADPR PRFSN 84XANTGRD RTRGD (ADAPTER) ×2
ANTEGRADE CPLG (MISCELLANEOUS) ×3 IMPLANT
ATTRACTOMAT 16X20 MAGNETIC DRP (DRAPES) ×3 IMPLANT
BAG DECANTER FOR FLEXI CONT (MISCELLANEOUS) ×3 IMPLANT
BLADE STERNUM SYSTEM 6 (BLADE) ×3 IMPLANT
BLADE SURG 15 STRL LF DISP TIS (BLADE) ×2 IMPLANT
BLADE SURG 15 STRL SS (BLADE) ×3
CANISTER SUCTION 2500CC (MISCELLANEOUS) ×3 IMPLANT
CANNULA AORTIC HI-FLOW 6.5M20F (CANNULA) ×2 IMPLANT
CANNULA GUNDRY RCSP 15FR (MISCELLANEOUS) ×3 IMPLANT
CANNULA VENOUS LOW PROF 34X46 (CANNULA) ×2 IMPLANT
CATH HEART VENT LEFT (CATHETERS) ×1 IMPLANT
CATH ROBINSON RED A/P 18FR (CATHETERS) ×9 IMPLANT
CATH THORACIC 36FR (CATHETERS) ×3 IMPLANT
CATH THORACIC 36FR RT ANG (CATHETERS) ×3 IMPLANT
CLOTH BEACON ORANGE TIMEOUT ST (SAFETY) ×3 IMPLANT
CONT SPEC STER OR (MISCELLANEOUS) ×3 IMPLANT
COVER SURGICAL LIGHT HANDLE (MISCELLANEOUS) ×6 IMPLANT
CRADLE DONUT ADULT HEAD (MISCELLANEOUS) ×3 IMPLANT
DRAPE SLUSH MACHINE 52X66 (DRAPES) IMPLANT
DRAPE SLUSH/WARMER DISC (DRAPES) ×2 IMPLANT
DRSG COVADERM 4X14 (GAUZE/BANDAGES/DRESSINGS) ×3 IMPLANT
ELECT CAUTERY BLADE 6.4 (BLADE) IMPLANT
ELECT REM PT RETURN 9FT ADLT (ELECTROSURGICAL) ×6
ELECTRODE REM PT RTRN 9FT ADLT (ELECTROSURGICAL) ×4 IMPLANT
GLOVE BIO SURGEON STRL SZ 6 (GLOVE) ×3 IMPLANT
GLOVE BIO SURGEON STRL SZ 6.5 (GLOVE) ×2 IMPLANT
GLOVE BIO SURGEON STRL SZ7 (GLOVE) ×1 IMPLANT
GLOVE BIO SURGEON STRL SZ7.5 (GLOVE) IMPLANT
GLOVE BIOGEL PI IND STRL 6.5 (GLOVE) ×8 IMPLANT
GLOVE BIOGEL PI IND STRL 8 (GLOVE) ×1 IMPLANT
GLOVE BIOGEL PI INDICATOR 6.5 (GLOVE) ×4
GLOVE BIOGEL PI INDICATOR 8 (GLOVE) ×1
GLOVE EUDERMIC 7 POWDERFREE (GLOVE) ×6 IMPLANT
GOWN PREVENTION PLUS XLARGE (GOWN DISPOSABLE) ×3 IMPLANT
GOWN STRL NON-REIN LRG LVL3 (GOWN DISPOSABLE) ×12 IMPLANT
HEART VENT LT CURVED (MISCELLANEOUS) ×3 IMPLANT
HEMOSTAT POWDER SURGIFOAM 1G (HEMOSTASIS) ×9 IMPLANT
HEMOSTAT SURGICEL 2X14 (HEMOSTASIS) ×3 IMPLANT
KIT BASIN OR (CUSTOM PROCEDURE TRAY) ×3 IMPLANT
KIT CATH CPB BARTLE (MISCELLANEOUS) ×3 IMPLANT
KIT ROOM TURNOVER OR (KITS) ×3 IMPLANT
KIT SUCTION CATH 14FR (SUCTIONS) ×3 IMPLANT
NS IRRIG 1000ML POUR BTL (IV SOLUTION) ×18 IMPLANT
PACK OPEN HEART (CUSTOM PROCEDURE TRAY) ×3 IMPLANT
PAD ARMBOARD 7.5X6 YLW CONV (MISCELLANEOUS) ×6 IMPLANT
SPONGE GAUZE 4X4 12PLY (GAUZE/BANDAGES/DRESSINGS) ×3 IMPLANT
SUT BONE WAX W31G (SUTURE) ×3 IMPLANT
SUT ETHIBON 2 0 V 52N 30 (SUTURE) ×6 IMPLANT
SUT ETHIBOND 2 0 SH (SUTURE) ×3
SUT ETHIBOND 2 0 SH 36X2 (SUTURE) ×1 IMPLANT
SUT PROLENE 3 0 SH 1 (SUTURE) ×3 IMPLANT
SUT PROLENE 3 0 SH DA (SUTURE) ×3 IMPLANT
SUT PROLENE 4 0 RB 1 (SUTURE) ×15
SUT PROLENE 4-0 RB1 .5 CRCL 36 (SUTURE) ×8 IMPLANT
SUT STEEL 6MS V (SUTURE) ×4 IMPLANT
SUT VIC AB 1 CTX 36 (SUTURE) ×6
SUT VIC AB 1 CTX36XBRD ANBCTR (SUTURE) ×4 IMPLANT
SUTURE E-PAK OPEN HEART (SUTURE) ×3 IMPLANT
SYSTEM SAHARA CHEST DRAIN ATS (WOUND CARE) ×3 IMPLANT
TAPE CLOTH SURG 4X10 WHT LF (GAUZE/BANDAGES/DRESSINGS) ×2 IMPLANT
TAPE PAPER 2X10 WHT MICROPORE (GAUZE/BANDAGES/DRESSINGS) ×2 IMPLANT
TOWEL OR 17X24 6PK STRL BLUE (TOWEL DISPOSABLE) ×6 IMPLANT
TOWEL OR 17X26 10 PK STRL BLUE (TOWEL DISPOSABLE) ×6 IMPLANT
TRAY FOLEY IC TEMP SENS 14FR (CATHETERS) ×3 IMPLANT
TUBE SUCT INTRACARD DLP 20F (MISCELLANEOUS) ×3 IMPLANT
UNDERPAD 30X30 INCONTINENT (UNDERPADS AND DIAPERS) ×3 IMPLANT
VALVE MAGNA EASE AORTIC 23MM (Prosthesis & Implant Heart) ×3 IMPLANT
VENT LEFT HEART 12002 (CATHETERS) ×3
WATER STERILE IRR 1000ML POUR (IV SOLUTION) ×6 IMPLANT

## 2013-02-17 NOTE — Progress Notes (Signed)
Reported off to oncoming shift RN. Safety maintained. No acute distress noted. VSS.

## 2013-02-17 NOTE — Anesthesia Procedure Notes (Addendum)
Procedure Name: Intubation Date/Time: 02/17/2013 7:56 AM Performed by: Carmela Rima Pre-anesthesia Checklist: Patient identified, Timeout performed, Emergency Drugs available, Suction available and Patient being monitored Patient Re-evaluated:Patient Re-evaluated prior to inductionOxygen Delivery Method: Circle system utilized Preoxygenation: Pre-oxygenation with 100% oxygen Intubation Type: IV induction Ventilation: Mask ventilation without difficulty Laryngoscope Size: Mac and 3 Grade View: Grade IV Tube type: Oral Tube size: 8.0 mm Number of attempts: 2 Airway Equipment and Method: Video-laryngoscopy Placement Confirmation: ETT inserted through vocal cords under direct vision,  positive ETCO2 and breath sounds checked- equal and bilateral Secured at: 24 cm Tube secured with: Tape Dental Injury: Teeth and Oropharynx as per pre-operative assessment  Difficulty Due To: Difficulty was anticipated

## 2013-02-17 NOTE — Progress Notes (Signed)
Nursing  Noted 1 hr extubation ABG results, dangle done and pt tolerated well.  Will continue aggressive IS and pulmonary toilet overnight, will monitor pt closely.  Pt able to pull 1250-1500 on IS.  L Clevie Prout RN

## 2013-02-17 NOTE — OR Nursing (Signed)
Pt with known allergies to bananas. Pt questioned about allergies to latex, pt stated he has no allergies to latex. Will monitor.

## 2013-02-17 NOTE — Progress Notes (Signed)
  Echocardiogram Echocardiogram Transesophageal (OR) has been performed.  Jorje Guild 02/17/2013, 8:43 AM

## 2013-02-17 NOTE — Brief Op Note (Addendum)
02/17/2013  12:52 PM  PATIENT:  [name]  72 y.o. 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 INTRAOPERATIVE TRANSESOPHAGEAL ECHOCARDIOGRAM (N/A)  SURGEON:  Surgeon(s) and Role:    * [name], MD - Primary  PHYSICIAN ASSISTANT: none  ASSISTANTS: RNFA   ANESTHESIA:  GET  EBL:  Total I/O In: 2900 [I.V.:2200; Blood:450; IV Piggyback:250] Out: 4520 [Urine:3100; Blood:1420]  BLOOD ADMINISTERED:none  DRAINS: 2 Chest Tube(s) in the mediastinum   LOCAL MEDICATIONS USED:  NONE  SPECIMEN:  Aortic valve  DISPOSITION OF SPECIMEN:  PATHOLOGY  COUNTS:  YES  TOURNIQUET:  * No tourniquets in log *  DICTATION: .Note written in EPIC  PLAN OF CARE: Admit to inpatient   PATIENT DISPOSITION:  ICU - intubated and hemodynamically stable.   Delay start of Pharmacological VTE agent (>24hrs) due to surgical blood loss or risk of bleeding: yes

## 2013-02-17 NOTE — Interval H&P Note (Signed)
History and Physical Interval Note:  02/17/2013 7:30 AM  [name]  has presented today for surgery, with the diagnosis of AS  The various methods of treatment have been discussed with the patient and family. After consideration of risks, benefits and other options for treatment, the patient has consented to  Procedure(s): AORTIC VALVE REPLACEMENT (AVR) (N/A) INTRAOPERATIVE TRANSESOPHAGEAL ECHOCARDIOGRAM (N/A) as a surgical intervention .  The patient's history has been reviewed, patient examined, no change in status, stable for surgery.  I have reviewed the patient's chart and labs.  Questions were answered to the patient's satisfaction.     [name]

## 2013-02-17 NOTE — Progress Notes (Signed)
Utilization Review Completed.[name] T6/23/2014  

## 2013-02-17 NOTE — H&P (Signed)
[address]       [name] [address]             [phone]     PCP is Emeterio Reeve, MD Referring Provider is Bensimhon, Bevelyn Buckles, MD    Chief Complaint   Patient presents with   .  Aortic Stenosis       Surgical eval, cardiac cath 12/26/12, ECHO 12/16/12     HPI:  The patient is a 72 year old gentleman with type II DM, hyperlipidemia, and aortic stenosis that has been followed for several years and has been progressively worsening. An echo in January 2013 showed a mean gradient of 35. Echo now shows a mean gradient of 39 with an AVA of 0.7. He reports slowly progressive symptoms with exertional dyspnea and fatigue, some dizziness with standing up. 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 This is being followed at [facility] and felt to be stable. CT angiogram of the chest which was done to assess the size of this aortic root and ascending aorta. This shows mild dilatation of the ascending aorta with a maximum diameter at the level of the right pulmonary artery of 3.7 cm. The distal aortic arch and descending aorta are 2.2 cm and 1.9 cm in diameter respectively. The maximum diameter of the sinuses of Valsalva is about 4 cm which is borderline dilated. 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     Past Medical History   Diagnosis  Date   .  Aortic stenosis         moderate. Echo 3/10: EF 55-60%. mean gradient 25 mmHg. AVA 1.23cm2. Echo 3/11: Normal EF. mean AVA gradient . exercise myoview 2008: positive ECG. normal perfusion   .  Exposure to secondhand smoke         extensive exposure   .  Hyperlipidemia         treated   .  Depression     .  Asthma     .  DM2 (diabetes mellitus, type 2)     .  Heart murmur       .  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   .  Chronic lymphocytic leukemia         followed by Dr. Candyce Churn   .  LBBB (left bundle branch block)         Past Surgical History   Procedure  Laterality  Date   .  Xrt and surgical resection           s/p. 10 years ago   .  Stapendectomy    07/30/02       right       Family History   Problem  Relation  Age of Onset   .  Cancer  Father         lung     Social History History   Substance Use Topics   .  Smoking status:  Never Smoker    .  Smokeless tobacco:  Not on file   .  Alcohol Use:  No       Current Outpatient Prescriptions   Medication  Sig  Dispense  Refill   .  acetaminophen (TYLENOL) 500 MG tablet  Take 500 mg by mouth 3 (three) times daily.          Marland Kitchen  aspirin EC 81 MG tablet  Take 81 mg by mouth daily.         Marland Kitchen  atorvastatin (LIPITOR) 40 MG tablet  Take 40 mg by mouth daily.         .  busPIRone (BUSPAR) 15 MG tablet  Take 15-30 mg by mouth 2 (two) times daily. One tablet every morning and two tablets every day at noon         .  Cholestyramine POWD  Take 1 scoop by mouth 2 (two) times daily.          .  cycloSPORINE (RESTASIS) 0.05 % ophthalmic emulsion  Place 1 drop into both eyes 2 (two) times daily.          Marland Kitchen  desloratadine (CLARINEX) 5 MG tablet  Take 5 mg by mouth daily.           .  fenofibrate 160 MG tablet  Take 160 mg by mouth daily.           .  fluticasone (FLONASE) 50 MCG/ACT nasal spray  Place 2 sprays into the nose daily.          Marland Kitchen  FORA V12 BLOOD GLUCOSE TEST test strip           .  furosemide (LASIX) 20 MG tablet  Take 1 tablet (20 mg total) by mouth 2 (two) times daily.   30 tablet   6   .  gabapentin (NEURONTIN) 300 MG capsule  Take 300 mg by mouth 2 (two) times daily. One capsule in the morning and 2 capsules at noon         .  LITETOUCH LANCETS MISC           .  Multiple Vitamin (MULTIVITAMIN) tablet  Take 1 tablet by mouth daily.           .  potassium chloride SA  (K-DUR,KLOR-CON) 20 MEQ tablet  Take 1 tablet (20 mEq total) by mouth daily.   30 tablet   6   .  PROCTOZONE-HC 2.5 % rectal cream  Place 1 application rectally 2 (two) times daily.          .  sertraline (ZOLOFT) 100 MG tablet  Take 100 mg by mouth daily.           .  traZODone (DESYREL) 50 MG tablet  Take 150 mg by mouth at bedtime.             No current facility-administered medications for this visit.       Allergies   Allergen  Reactions   .  Penicillins  Rash     Review of Systems  Constitutional: Positive for activity change and fatigue. Negative for fever, chills, diaphoresis, appetite change and unexpected weight change.  HENT: Negative.   Eyes: Negative.   Respiratory: Positive for shortness of breath.   Cardiovascular: Positive for leg swelling. Negative for chest pain and palpitations.  Gastrointestinal: Negative.   Endocrine: Negative.   Genitourinary: Negative.   Musculoskeletal: Negative.   Skin: Negative.   Allergic/Immunologic: Negative.   Neurological: Negative.   Hematological: Negative.   Psychiatric/Behavioral: Negative.     BP 120/69  Pulse 68  Resp 18  Ht 5\' 3"  (1.6 m)  Wt 122 lb (55.339 kg)  BMI 21.62 kg/m2  SpO2 98% Physical Exam  Constitutional: He appears well-developed and well-nourished. No distress.  HENT:   Head: Normocephalic and atraumatic.   Mouth/Throat: Oropharynx is clear and moist.  Eyes: Conjunctivae and EOM are normal. Pupils are equal, round, and reactive to light.  Neck: Normal range of motion. Neck supple. No JVD present. No tracheal deviation present. No thyromegaly present.  Cardiovascular: Normal rate, regular rhythm and intact distal pulses.  Exam reveals no gallop and no friction rub.    Murmur heard. 3/6 crescendo/decrescendo murmur over aorta.  Pulmonary/Chest: Effort normal. He has no rales.  Abdominal: Soft. Bowel sounds are normal. He exhibits no distension and no mass. There is no tenderness.  Musculoskeletal: He  exhibits no edema.  Lymphadenopathy:    He has no cervical adenopathy.  Neurological: He is alert. He has normal strength. No cranial nerve deficit or sensory deficit.  Skin: Skin is warm and dry.  Psychiatric: He has a normal mood and affect.     Diagnostic Tests:  CARDIAC CATH:  Findings:  RA = 4 RV = 31/3/7 PA = 31/8 (18) PCW = 9 Fick cardiac output/index = 4.2/2.7 PVR = 1.0 Woods FA sat = 95% PA sat = 68%, 73%  Ao Pressure: 135/58 (89) LV Pressure:  166/8/16 Simultaneous LV-AO gradient: 31 mm HG (peak to peak) (mean) AVA 0.78cm2  Left main: Absent. Probable separate LAD and LCX ostia  LAD: Moderate sized vessel. Which bifurcated in the midsection into a large D2 and small distal LA. 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 There is a 70-80% stenosis in the proximal portion of the D1. The remainder of the LAD system is normal.   LCX: Small vessel. Gives off OM-1 and OM-2. Normal.  RCA: Large dominant vessel. With a large PDA and 3 PLS. Normal  LV-gram done in the RAO projection: Ejection fraction = 60%. Nor regional wall motion abnormalities. The AoV is heavily calcified.  Assessment: 1. 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 Normal intracardiac pressures 3. Moderate to severe aortic stenosis  Plan/Discussion:  Will arrange for him to see TCTS for AVR and possible ligation of fistulous D1. Will order chest CTA to further evaluate anomalous vessel. Given neuroendocrine tumor will need somatostatin infusion during AVR and post-op.   Arvilla Meres, MD 4:23 PM    *[place]*                *[facility]*                      [address]                     [place], [place] [address]                         [phone]     ------------------------------------------------------------ Transthoracic Echocardiography  Patient:    Khallid, Pasillas MR #:       [id number] Study Date: 12/16/2012 Gender:     M Age:        53 Height:     160cm Weight:     57.2kg BSA:        1.53m^2 Pt. Status: Room:    PERFORMING   Boy[place], [facility]  REFERRING    Mila Palmer  SONOGRAPHER  [address] [facility]  ORDERING     Bensimhon, Daniel cc:  ------------------------------------------------------------  ------------------------------------------------------------ Indications:      Aortic stenosis 424.1.  ------------------------------------------------------------ History:   Risk factors:  Testicular Cancer. Leukemia. Asthma. Calcified mesenteric mass. Dyslipidemia.  ------------------------------------------------------------ Study Conclusions  - Left ventricle: LVEF Is approximately 55 to 60% with   hypokinesis of the distal inferoseptal wall. The cavity   size was normal. Wall thickness was increased in a pattern   of mild LVH. Doppler parameters are consistent with   abnormal left ventricular relaxation (grade 1 diastolic   dysfunction). - Aortic valve: AV is difficult to see. It is thickened,   calcified with restricted motion. Peak and mean gradients   through valve are50 and 32 mm Hg respectively consistent   with moderate AS. Transthoracic echocardiography.  M-mode, complete 2D, spectral Doppler, and color Doppler.  Height:  Height: 160cm. Height: 63in.  Weight:  Weight: 57.2kg. Weight: 125.7lb.  Body mass index:  BMI: 22.3kg/m^2.  Body surface area:    BSA: 1.10m^2.  Blood pressure:     134/72.  Patient status:  Outpatient.  Location:  Echo laboratory.  ------------------------------------------------------------  ------------------------------------------------------------ Left ventricle:  LVEF Is approximately 55 to 60% with hypokinesis of the distal inferoseptal wall. The cavity size was  normal. Wall thickness was increased in a pattern of mild LVH. Doppler parameters are consistent with abnormal left ventricular relaxation (grade 1 diastolic dysfunction).   ------------------------------------------------------------ Aortic valve:  AV is difficult to see. It is thickened, calcified with restricted motion. Peak and mean gradients through valve are50 and 32 mm Hg respectively consistent with moderate AS.  Doppler:   No significant regurgitation.   VTI ratio of LVOT to aortic valve: 0.2. Valve area: 0.7cm^2(VTI). Indexed valve area: 0.44cm^2/m^2 (VTI). Peak velocity ratio of LVOT to aortic valve: 0.2. Valve area: 0.71cm^2 (Vmax). Indexed valve area: 0.45cm^2/m^2 (Vmax). Mean gradient: 31mm Hg (S). Peak gradient: 50mm Hg (S).   ------------------------------------------------------------ Mitral valve:   Mildly thickened leaflets .  Doppler: Trivial regurgitation.    Peak gradient: 3mm Hg (D).  ------------------------------------------------------------ Left atrium:  The atrium was normal in size.  ------------------------------------------------------------ Right ventricle:  The cavity size was normal. Wall thickness was normal. Systolic function was normal.  ------------------------------------------------------------ Tricuspid valve:   Structurally normal valve.   Leaflet separation was normal.  Doppler:  Transvalvular velocity was within the normal range.  Mild regurgitation.  ------------------------------------------------------------ Right atrium:  The atrium was mildly dilated.  ------------------------------------------------------------ Pericardium:  There was no pericardial effusion.  ------------------------------------------------------------  2D measurements        Normal  Doppler measurements   Normal Left ventricle                 Main pulmonary LVID ED,   40.5 mm     43-52   artery chord,                         Pressure,    28 mm Hg  =30 PLAX                            S LVID ES,   26.7 mm     23-38   Left ventricle chord,                         Ea, lat  8.22 cm/s   ------ PLAX                           ann, tiss FS, chord,   34 %      >29     DP PLAX                           E/Ea, lat  10.4        ------ LVPW, ED   11.7 mm     ------  ann, tiss     1 IVS/LVPW   0.93        <1.3    DP ratio, ED                      Ea, med    6.58 cm/s   ------ Ventricular septum             ann, tiss IVS, ED    10.9 mm     ------  DP LVOT                           E/Ea, med  13.0        ------ Diam, S      21 mm     ------  ann, tiss     1 Area       3.46 cm^2   ------  DP Aorta                          LVOT Root diam,   32 mm     ------  Peak vel,  72.3 cm/s   ------ ED                             S AAo AP       30 mm     ------  VTI, S     18.8 cm     ------ diam, S                        Aortic valve Left atrium                    Peak vel,   354 cm/s   ------ AP dim       30 mm     ------  S AP dim     1.89 cm/m^2 <2.2    Mean vel,   265 cm/s   ------ index                          S Vol, S       57 ml     ------  VTI, S     92.5 cm     ------ Vol index, 35.8 ml/m^2 ------  Mean         31 mm Hg  ------ S                              gradient,  S                                Peak         50 mm Hg  ------                                gradient,                                S                                VTI ratio   0.2        ------                                LVOT/AV                                Area, VTI   0.7 cm^2   ------                                Area index 0.44 cm^2/m ------                                (VTI)           ^2                                Peak vel    0.2        ------                                ratio,                                LVOT/AV                                Area, Vmax 0.71 cm^2   ------                                Area index 0.45 cm^2/m  ------                                (Vmax)          ^2                                Mitral valve                                Peak E vel 85.6 cm/s   ------  Peak A vel 94.5 cm/s   ------                                Decelerati  183 ms     150-23                                on time                0                                Peak          3 mm Hg  ------                                gradient,                                D                                Peak E/A    0.9        ------                                ratio                                Tricuspid valve                                Regurg      242 cm/s   ------                                peak vel                                Peak RV-RA   23 mm Hg  ------                                gradient,                                S                                Systemic veins                                Estimated     5 mm Hg  ------  CVP                                Right ventricle                                Pressure,    28 mm Hg  <30                                S                                Sa vel,    11.5 cm/s   ------                                lat ann,                                tiss DP   ------------------------------------------------------------ Prepared and Electronically Authenticated by  Dietrich Pates 2014-04-21T14:22:[id number]   *RADIOLOGY REPORT*   Clinical Data: Preoperative evaluation prior to aortic valve replacement.  History of right-sided testicular cancer.   CT ANGIOGRAPHY CHEST   Technique:  Multidetector CT imaging of the chest using the standard protocol during bolus administration of intravenous contrast. Multiplanar reconstructed images including MIPs were obtained and reviewed to evaluate the vascular anatomy.   Contrast: 80mL OMNIPAQUE IOHEXOL 350 MG/ML SOLN   Comparison: PET CT  08/16/2012.   Findings:   Mediastinum: Heart size is normal. There is no significant pericardial fluid, thickening or pericardial calcification. Extensive thickening and calcification of the aortic valve. Sinuses of Valsalva appear borderline dilated measuring approximately 4 cm in diameter on today's non gated CT examination. Ascending thoracic aorta is slightly prominent measuring 3.7 cm in diameter.  The distal aortic arch and descending thoracic aorta are normal in caliber measuring 2.2 cm and 1.9 cm in diameter respectively.  No thoracic aortic dissection at this time. There is atherosclerosis of the thoracic aorta, the great vessels of the mediastinum and the coronary arteries, including calcified atherosclerotic plaque in the left anterior descending coronary arteries. No pathologically enlarged mediastinal or hilar lymph nodes. Esophagus is unremarkable in appearance.   Lungs/Pleura: Linear opacities in the medial segment of the right middle lobe and the left lower lobe are favored to represent areas of chronic scarring or subsegmental atelectasis.  Azygos lobe (normal anatomical variant) incidentally noted.  No acute consolidative air space disease.  No pleural effusions. Nonspecific 2 mm right upper lobe nodule (image 28 of series 5) is noted.  No other larger more suspicious appearing pulmonary nodules or masses are identified.   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   Musculoskeletal: There are no aggressive appearing lytic or blastic lesions noted in the visualized portions of the skeleton.   IMPRESSION: 1.  Severe thickening and calcifications of the  aortic valve, compatible with  the patient's history of aortic stenosis.  Sinuses of Valsalva appear borderline dilated measuring approximately 4 cm in diameter on today's non-gated CT examination. 2.  Nonspecific 2 mm right upper lobe pulmonary nodule.  This is statistically likely benign. If the patient is at high risk for bronchogenic carcinoma, follow-up chest CT at 1 year is recommended.  If the patient is at low risk, no follow-up is needed.  This recommendation follows the consensus statement: Guidelines for Management of Small Pulmonary Nodules Detected on CT Scans:  A Statement from the Fleischner Society as published in Radiology 2005; 237:395-400. 3. Atherosclerosis, including left anterior descending coronary artery disease. Assessment for potential risk factor modification, dietary therapy or pharmacologic therapy may be warranted, if clinically indicated.    4.  1.6 cm hypervascular lesion in the medial aspect of the spleen is unchanged compared to the prior examination and was not hypermetabolic on the prior PET CT.  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     Original Report Authenticated By: Trudie Reed, M.D.     Impression:  He has mild dilatation of the ascending aorta with moderate to severe aortic stenosis. I don't think he requires replacement of the ascending aorta since it is less than 4 cm and he is 72 years old. I reviewed the CT scan with the patient and his daughter and my recommendation for leaving the aorta alone. I would plan to use a tissue valve to replace his aortic valve. I discussed the operative procedure with the patient and family including alternatives, benefits and risks; including but not limited to bleeding, blood transfusion, infection, stroke, myocardial infarction, graft failure, heart block requiring a permanent pacemaker, organ dysfunction, and death.  Arrie Senate understands and agrees to  proceed.    Plan:  Aortic valve replacement on March 03, 2013 per patient's request. We will use somatostatin infusion during the procedure due to his hx of an abdominal neuroendocrine tumor if that is recommended. He is returning to see his physician at [facility] in June concerning this.

## 2013-02-17 NOTE — Anesthesia Preprocedure Evaluation (Addendum)
Anesthesia Evaluation  Patient identified by MRN, date of birth, ID band Patient awake    Reviewed: Allergy & Precautions, H&P , NPO status , Patient's Chart, lab work & pertinent test results, reviewed documented beta blocker date and time   Airway Mallampati: II TM Distance: >3 FB Neck ROM: full    Dental  (+) Teeth Intact, Dental Advidsory Given and Caps   Pulmonary shortness of breath, asthma ,          Cardiovascular + dysrhythmias Supra Ventricular Tachycardia + Valvular Problems/Murmurs AS     Neuro/Psych PSYCHIATRIC DISORDERS Depression    GI/Hepatic   Endo/Other  diabetes, Well Controlled, Type 2  Renal/GU      Musculoskeletal   Abdominal   Peds  Hematology  (+) Blood dyscrasia, , CLL   Anesthesia Other Findings Patient stated he may be difficult to intubate based on history of prior intubations.  Glide scope and intubating stylette readily available.  Reproductive/Obstetrics                         Anesthesia Physical Anesthesia Plan  ASA: III  Anesthesia Plan: General   Post-op Pain Management:    Induction: Intravenous  Airway Management Planned: Oral ETT  Additional Equipment: Arterial line, CVP, PA Cath, TEE and Ultrasound Guidance Line Placement  Intra-op Plan:   Post-operative Plan: Post-operative intubation/ventilation  Informed Consent: I have reviewed the patients History and Physical, chart, labs and discussed the procedure including the risks, benefits and alternatives for the proposed anesthesia with the patient or authorized representative who has indicated his/her understanding and acceptance.   Dental Advisory Given and History available from chart only  Plan Discussed with: Anesthesiologist, CRNA and Surgeon  Anesthesia Plan Comments:       Anesthesia Quick Evaluation

## 2013-02-17 NOTE — Progress Notes (Signed)
Patient ID: [name], male   DOB: 29-May-1941, 72 y.o.   MRN: [id number] EVENING ROUNDS NOTE :     [address]       [name] [address]             [phone]                 Day of Surgery Procedure(s) (LRB): AORTIC VALVE REPLACEMENT (AVR) (N/A) INTRAOPERATIVE TRANSESOPHAGEAL ECHOCARDIOGRAM (N/A)  Total Length of Stay:  LOS: 0 days  BP 125/61  Pulse 84  Temp(Src) 100 F (37.8 C) (Core (Comment))  Resp 9  Wt 129 lb 4.8 oz (58.65 kg)  BMI 22.18 kg/m2  SpO2 100%  .Intake/Output     06/22 0701 - 06/23 0700 06/23 0701 - 06/24 0700   I.V. (mL/kg)  2600 (44.3)   Blood  450   IV Piggyback  750   Total Intake(mL/kg)  3800 (64.8)   Urine (mL/kg/hr)  4050 (6.4)   Blood  1420 (2.2)   Chest Tube  240 (0.4)   Total Output   5710   Net   -1910          . sodium chloride 20 mL/hr (02/17/13 1340)  . sodium chloride 20 mL/hr (02/17/13 1342)  . [START ON 02/18/2013] sodium chloride    . dexmedetomidine Stopped (02/17/13 1541)  . insulin (NOVOLIN-R) infusion Stopped (02/17/13 1500)  . lactated ringers 20 mL/hr at 02/17/13 1550  . nitroGLYCERIN Stopped (02/17/13 1437)  . phenylephrine (NEO-SYNEPHRINE) Adult infusion 5 mcg/min (02/17/13 1747)     Lab Results  Component Value Date   WBC 3.9* 02/17/2013   HGB 9.9* 02/17/2013   HCT 28.8* 02/17/2013   PLT 108* 02/17/2013   GLUCOSE 82 02/17/2013   ALT 18 02/13/2013   AST 26 02/13/2013   NA 143 02/17/2013   K 4.2 02/17/2013   CL 104 02/13/2013   CREATININE 0.99 02/13/2013   BUN 20 02/13/2013   CO2 23 02/13/2013   PSA 4.16* 10/11/2007   INR 1.49 02/17/2013   HGBA1C 5.9* 02/13/2013   Now extubated Not bleeding hemodynamics stable  Delight Ovens MD  Beeper [phone] Office [phone] 02/17/2013 5:53 PM

## 2013-02-17 NOTE — Procedures (Signed)
Extubation Procedure Note  Patient Details:   Name: [name] DOB: December 12, 1940 MRN: [id number]  Pt extubated to 4L [place] per protocol. Pt able to vocalize, no stridor noted, VS WNL, RT will continue to monitor.    Evaluation  O2 sats: stable throughout Complications: No apparent complications Patient did tolerate procedure well. Bilateral Breath Sounds: Clear;Diminished   Yes  Harley Hallmark 02/17/2013, 5:33 PM

## 2013-02-17 NOTE — Progress Notes (Signed)
Extubated at this time to 4L [place]. Alert and verbal. VSS.  No acute distress noted. Tolerated well.

## 2013-02-17 NOTE — Op Note (Signed)
CARDIOVASCULAR SURGERY OPERATIVE NOTE  02/17/2013 [name] [id number]   Surgeon:  [name], MD  First Assistant: RNFA   Preoperative Diagnosis:  Severe aortic stenosis   Postoperative Diagnosis:  Same   Procedure:  1. Median Sternotomy 2. Extracorporeal circulation 3.   Aortic valve replacement using a 23 Edwards Magna-Ease Pericardial valve. 4.   Ligation of fistula between diagonal 1 and the main pulmonary artery.  Anesthesia:  General Endotracheal   Clinical History/Surgical Indication:  The patient is a 72 year old gentleman with type II DM, hyperlipidemia, and aortic stenosis that has been followed for several years and has been progressively worsening. An echo in January 2013 showed a mean gradient of 35. Echo now shows a mean gradient of 39 with an AVA of 0.7.  Cardiac cath shows no obstructive coronary disease. 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 He reports slowly progressive symptoms with exertional dyspnea and fatigue, some dizziness with standing up. 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 This is being followed at [facility] and felt to be stable. CT angiogram of the chest which was done to assess the size of this aortic root and ascending aorta. This shows mild dilatation of the ascending aorta with a maximum diameter at the level of the right pulmonary artery of 3.7 cm. The distal aortic arch and descending aorta are 2.2 cm and 1.9 cm in diameter respectively. The maximum diameter of the sinuses of Valsalva is about 4 cm which is borderline dilated. 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 I would plan to use a tissue valve to replace his  aortic valve. I would plan to ligate the fistula between the diagonal branch and the PA if it is visible on the surface of the heart to prevent coronary steal. I discussed the operative procedure with the patient and family including alternatives, benefits and risks; including but not limited to bleeding, blood transfusion, infection, stroke, myocardial infarction, graft failure, heart block requiring a permanent pacemaker, organ dysfunction, and death. [name] understands and agrees to proceed.    Preparation:  The patient was seen in the preoperative holding area and the correct patient, correct operation were confirmed with the patient after reviewing the medical record and catheterization. The consent was signed by me. Preoperative antibiotics were given. A pulmonary arterial line and radial arterial line were placed by the anesthesia team. The patient was taken back to the operating room and positioned supine on the operating room table. After being placed under general endotracheal anesthesia by the anesthesia team a foley catheter was placed. The neck, chest, abdomen, and both legs were prepped with betadine soap and solution and draped in the usual sterile manner. A surgical time-out was taken and the correct patient and operative procedure were confirmed with the nursing and anesthesia staff.   Pre-bypass TEE:   Complete TEE assessment was performed by the anesthesiologist. This showed severe calcific aortic stenosis with trivial AI and MR. LV function was normal.    Post-bypass TEE:   Normal functioning prosthetic aortic valve with no perivalvular leak or regurgitation through the valve. Left ventricular function preserved. No mitral regurgitation.    Cardiopulmonary Bypass:  A  median sternotomy was performed. The pericardium was opened in the midline. Right ventricular function appeared normal. The ascending aorta was of normal size and had no palpable plaque. There were no  contraindications to aortic cannulation or cross-clamping. The patient was fully systemically heparinized and the ACT was maintained > 400 sec. The proximal aortic arch was cannulated with a 22 F aortic cannula for arterial inflow. Venous cannulation was performed via the right atrial appendage using a two-staged venous cannula. An antegrade cardioplegia/vent cannula was inserted into the mid-ascending aorta. A left ventricular vent was placed via the right superior pulmonary vein. A retrograde cardioplegia cannnula was placed into the coronary sinus via the right atrium. Aortic occlusion was performed with a single cross-clamp. Systemic cooling to 32 degrees Centigrade and topical cooling of the heart with iced saline were used. Hyperkalemic antegrade cold blood cardioplegia was used to induce diastolic arrest and then cold blood retrograde cardioplegia was given at about 20 minute intervals throughout the period of arrest to maintain myocardial temperature at or below 10 degrees centigrade. A temperature probe was inserted into the interventricular septum and an insulating pad was placed in the pericardium. Carbon dioxide was insufflated into the pericardium at 5L/min throughout the procedure to minimize intracardiac air.   Aortic Valve Replacement:   A transverse aortotomy was performed 1 cm above the take-off of the right coronary artery. 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 There were 3 commissures. The ostia of the coronary arteries were in normal position and were not obstructed. The native valve leaflets were excised and the annulus was decalcified with rongeurs. Care was taken to remove all particulate debris. The left ventricle was directly inspected for debris and then irrigated with ice saline solution. The annulus was sized and a size 23 Edwards  Pericardial Magna-Ease valve was chosen. The  model number was [id number] and the serial number was [id number]. While the valve was being prepared 2-0 Ethibond pledgeted horizontal mattress sutures were placed around the annulus with the pledgets in a sub-annular position. The sutures were placed through the sewing ring and the valve lowered into place. The sutures were tied sequentially. The valve seated nicely and the coronary ostia were not obstructed. The prosthetic valve leaflets moved normally and there was no sub-valvular obstruction. The aortotomy was closed using 4-0 Prolene suture in 2 layers with felt strips to reinforce the closure.   Ligation of fistula between D1 and the main PA:  The fistulous artery was clearly visualized coursing from the diagonal branch over the main PA and inserting into the main PA between the aorta and the main PA. It was ligated with a 2-0 silk suture.   Completion:  The patient was rewarmed to 37 degrees Centigrade. De-airing maneuvers were performed and the head placed in trendelenburg position. The crossclamp was removed with a time of 89 minutes. There was spontaneous return of sinus rhythm. The aortotomy was checked for hemostasis. Two temporary epicardial pacing wires were placed on the right atrium and two on the right ventricle. The left ventricular vent and retrograde cardioplegia cannulas were removed. The patient was weaned from CPB without difficulty on no inotropes. CPB time was 119 minutes. Cardiac output was 5 LPM. Heparin was fully reversed with protamine and the aortic and venous cannulas removed. Hemostasis was achieved. Mediastinal drainage tubes were placed. The sternum was closed with double #6 stainless steel wires. The fascia was closed  with continuous # 1 vicryl suture. The subcutaneous tissue was closed with 2-0 vicryl continuous suture. The skin was closed with 3-0 vicryl subcuticular suture. All sponge, needle, and instrument counts were reported correct at the end of the case. Dry sterile  dressings were placed over the incisions and around the chest tubes which were connected to pleurevac suction. The patient was then transported to the surgical intensive care unit in critical but stable condition.

## 2013-02-17 NOTE — Plan of Care (Signed)
Problem: Phase II Progression Outcomes Goal: Patient extubated within - Pt extubated within 6hrs.

## 2013-02-18 ENCOUNTER — Inpatient Hospital Stay (HOSPITAL_COMMUNITY): Payer: Medicare Other

## 2013-02-18 LAB — GLUCOSE, CAPILLARY: Glucose-Capillary: 149 mg/dL — ABNORMAL HIGH (ref 70–99)

## 2013-02-18 LAB — CBC
HCT: 27.1 % — ABNORMAL LOW (ref 39.0–52.0)
HCT: 26.3 % — ABNORMAL LOW (ref 39.0–52.0)
Hemoglobin: 9.2 g/dL — ABNORMAL LOW (ref 13.0–17.0)
Hemoglobin: 8.7 g/dL — ABNORMAL LOW (ref 13.0–17.0)
MCH: 32.5 pg (ref 26.0–34.0)
MCHC: 33.9 g/dL (ref 30.0–36.0)
MCV: 95.8 fL (ref 78.0–100.0)
RBC: 2.83 MIL/uL — ABNORMAL LOW (ref 4.22–5.81)
RDW: 12.9 % (ref 11.5–15.5)
WBC: 6.4 10*3/uL (ref 4.0–10.5)

## 2013-02-18 LAB — BASIC METABOLIC PANEL
BUN: 18 mg/dL (ref 6–23)
CO2: 22 mEq/L (ref 19–32)
Calcium: 7.6 mg/dL — ABNORMAL LOW (ref 8.4–10.5)
GFR calc non Af Amer: 81 mL/min — ABNORMAL LOW (ref >90)
Glucose, Bld: 104 mg/dL — ABNORMAL HIGH (ref 70–99)

## 2013-02-18 LAB — POCT I-STAT, CHEM 8
BUN: 16 mg/dL (ref 6–23)
Calcium, Ion: 1.2 mmol/L (ref 1.13–1.30)
Chloride: 104 meq/L (ref 96–112)
Creatinine, Ser: 0.9 mg/dL (ref 0.50–1.35)
Glucose, Bld: 143 mg/dL — ABNORMAL HIGH (ref 70–99)
HCT: 25 % — ABNORMAL LOW (ref 39.0–52.0)
Hemoglobin: 8.5 g/dL — ABNORMAL LOW (ref 13.0–17.0)
Potassium: 4.1 meq/L (ref 3.5–5.1)
Sodium: 140 meq/L (ref 135–145)
TCO2: 24 mmol/L (ref 0–100)

## 2013-02-18 LAB — CREATININE, SERUM
Creatinine, Ser: 0.93 mg/dL (ref 0.50–1.35)
GFR calc Af Amer: >90 mL/min (ref >90)
GFR calc non Af Amer: 81 mL/min — ABNORMAL LOW (ref >90)

## 2013-02-18 LAB — MAGNESIUM: Magnesium: 2.2 mg/dL (ref 1.5–2.5)

## 2013-02-18 MED ORDER — INSULIN ASPART 100 UNIT/ML ~~LOC~~ SOLN
0.0000 [IU] | SUBCUTANEOUS | Status: DC
  Administered 2013-02-18 – 2013-02-19 (×5): 2 [IU] via SUBCUTANEOUS

## 2013-02-18 MED ORDER — POTASSIUM CHLORIDE 10 MEQ/50ML IV SOLN
10.0000 meq | INTRAVENOUS | Status: AC
  Administered 2013-02-18 (×3): 10 meq via INTRAVENOUS
  Filled 2013-02-18: qty 150

## 2013-02-18 MED ORDER — ENOXAPARIN SODIUM 40 MG/0.4ML ~~LOC~~ SOLN
40.0000 mg | Freq: Every day | SUBCUTANEOUS | Status: DC
  Administered 2013-02-18 – 2013-02-21 (×4): 40 mg via SUBCUTANEOUS
  Filled 2013-02-18 (×5): qty 0.4

## 2013-02-18 NOTE — Clinical Documentation Improvement (Signed)
Anemia Blood Loss Clarification  THIS DOCUMENT IS NOT A PERMANENT PART OF THE MEDICAL RECORD  RESPOND TO THE THIS QUERY, FOLLOW THE INSTRUCTIONS BELOW:  1. If needed, update documentation for the patient's encounter via the notes activity.  2. Access this query again and click edit on the In [employer].  3. After updating, or not, click F2 to complete all highlighted (required) fields concerning your review. Select "additional documentation in the medical record" OR "no additional documentation provided".  4. Click Sign note button.  5. The deficiency will fall out of your In Basket *Please let us know if you are not able to complete this workflow by phone or e-mail (listed below).        02/18/13  Dear Consepcion Hearing Marton Redwood  In an effort to better capture your patient's severity of illness, reflect appropriate length of stay and utilization of resources, a review of the patient medical record has revealed the following indicators.    Based on your clinical judgment, please clarify and document in a progress note and/or discharge summary the clinical condition associated with the following supporting information:  In responding to this query please exercise your independent judgment.  The fact that a query is asked, does not imply that any particular answer is desired or expected.    Possible Clinical Conditions?   " Expected Acute Blood Loss Anemia  " Acute Blood Loss Anemia  " Acute on chronic blood loss anemia  " Precipitous drop in Hematocrit  " Other Condition  " Cannot Clinically Determine     Risk Factors:  EBL per 6/23 Anesthesia record: .   Diagnostics: H&H on 6/19:   12.6/37.7 H&H on 6/23:   8.5/25.0   IV fluids / plasma expanders: Per 6/23 Anesthesia record: Cell saver: ; Albumin: ; LR:   Reviewed: additional documentation in the medical record  Thank Patsy Lager  Clinical Documentation  Specialist:  Phone: [phone]  Health Information Management [place]

## 2013-02-18 NOTE — Progress Notes (Signed)
Patient ID: [name], male   DOB: 12-21-1940, 72 y.o.   MRN: [id number]   SICU Evening Rounds:  Stable day.  He remain in sinus with complete heart block and no ventricular escape.  Urine output good Ambulated  CBC    Component Value Date/Time   WBC 6.4 02/18/2013 1700   WBC 6.2 10/11/2007 0928   RBC 2.71* 02/18/2013 1700   RBC 4.50 10/11/2007 0928   HGB 8.5* 02/18/2013 1710   HGB 14.4 10/11/2007 0928   HCT 25.0* 02/18/2013 1710   HCT 43.1 10/11/2007 0928   PLT 99* 02/18/2013 1700   PLT 193 10/11/2007 0928   MCV 97.0 02/18/2013 1700   MCV 95.7 10/11/2007 0928   MCH 32.1 02/18/2013 1700   MCH 31.9 10/11/2007 0928   MCHC 33.1 02/18/2013 1700   MCHC 33.3 10/11/2007 0928   RDW 12.9 02/18/2013 1700   RDW 12.4 10/11/2007 0928   LYMPHSABS 1.3 10/11/2007 0928   MONOABS 0.5 10/11/2007 0928   EOSABS 0.1 10/11/2007 0928   BASOSABS 0.0 10/11/2007 0928    BMET    Component Value Date/Time   NA 140 02/18/2013 1710   K 4.1 02/18/2013 1710   CL 104 02/18/2013 1710   CO2 22 02/18/2013 0445   GLUCOSE 143* 02/18/2013 1710   BUN 16 02/18/2013 1710   CREATININE 0.90 02/18/2013 1710   CALCIUM 7.6* 02/18/2013 0445   GFRNONAA 81* 02/18/2013 1700   GFRAA >90 02/18/2013 1700

## 2013-02-18 NOTE — Progress Notes (Signed)
1 Day Post-Op Procedure(s) (LRB): AORTIC VALVE REPLACEMENT (AVR) (N/A) INTRAOPERATIVE TRANSESOPHAGEAL ECHOCARDIOGRAM (N/A) Subjective: No complaints  Objective: Vital signs in last 24 hours: Temp:  [95.4 F (35.2 C)-102.2 F (39 C)] 99.1 F (37.3 C) (06/24 0700) Pulse Rate:  [80-85] 84 (06/24 0700) Cardiac Rhythm:  [-] A-V Sequential paced (06/23 2330) Resp:  [9-19] 12 (06/24 0700) BP: (93-125)/(41-66) 93/41 mmHg (06/24 0700) SpO2:  [92 %-100 %] 100 % (06/24 0700) Arterial Line BP: (86-151)/(44-74) 116/45 mmHg (06/24 0700) FiO2 (%):  [40 %-50 %] 40 % (06/23 1701) Weight:  [60.1 kg (132 lb 7.9 oz)] 60.1 kg (132 lb 7.9 oz) (06/24 0500)  Hemodynamic parameters for last 24 hours: PAP: (15-38)/(9-21) 27/13 mmHg CO:  [3.2 L/min-4.8 L/min] 4.8 L/min CI:  [2 L/min/m2-3.1 L/min/m2] 3.1 L/min/m2  Intake/Output from previous day: 06/23 0701 - 06/24 0700 In: 5924.1 [P.O.:610; I.V.:3574.1; Blood:450; IV Piggyback:1290] Out: 7105 [Urine:5235; Blood:1420; Chest Tube:450] Intake/Output this shift:    General appearance: alert and cooperative Neurologic: intact Heart: regular rate and rhythm, normal prosthetic valve sounds Lungs: clear to auscultation bilaterally Extremities: extremities normal, atraumatic, no cyanosis or edema Wound: dressing dry  Lab Results:  Recent Labs  02/17/13 1825 02/18/13 0445  WBC 10.8* 7.6  HGB 10.6* 9.2*  HCT 31.2* 27.1*  PLT 173 112*   BMET:  Recent Labs  02/17/13 1817 02/17/13 1825 02/18/13 0445  NA 146*  --  142  K 4.1  --  3.5  CL 113*  --  110  CO2  --   --  22  GLUCOSE 149*  --  104*  BUN 19  --  18  CREATININE 0.90 1.09 0.95  CALCIUM  --   --  7.6*    PT/INR:  Recent Labs  02/17/13 1308  LABPROT 17.6*  INR 1.49   ABG    Component Value Date/Time   PHART 7.242* 02/17/2013 1821   HCO3 23.4 02/17/2013 1821   TCO2 25 02/17/2013 1821   ACIDBASEDEF 4.0* 02/17/2013 1821   O2SAT 99.0 02/17/2013 1821   CBG (last 3)   Recent  Labs  02/17/13 2350 02/18/13 0253 02/18/13 0458  GLUCAP 79 93 89   CXR: clear.  ECG:  Complete heart block with no ventricular escape.  Assessment/Plan: S/P Procedure(s) (LRB): AORTIC VALVE REPLACEMENT (AVR) (N/A) INTRAOPERATIVE TRANSESOPHAGEAL ECHOCARDIOGRAM (N/A) Complete heart block:  He had a large amount of calcium in the annulus and subannular in the region of the AV node. Some of this had to be removed to place the valve and he is at high risk for permanent CHB and need for permanent pacer. EP will need to see him. Will not start B-blocker. Mobilize d/c tubes/lines See progression orders   LOS: 1 day    Nandi Tonnesen K 02/18/2013

## 2013-02-18 NOTE — Transfer of Care (Signed)
Immediate Anesthesia Transfer of Care Note  Patient: CACHE BILLS  Procedure(s) Performed: Procedure(s): AORTIC VALVE REPLACEMENT (AVR) (N/A) INTRAOPERATIVE TRANSESOPHAGEAL ECHOCARDIOGRAM (N/A)  Patient Location: SICU  Anesthesia Type:General  Level of Consciousness: Patient remains intubated per anesthesia plan  Airway & Oxygen Therapy: Patient remains intubated per anesthesia plan and Patient placed on Ventilator (see vital sign flow sheet for setting)  Post-op Assessment: Report given to PACU RN and Post -op Vital signs reviewed and stable  Post vital signs: Reviewed and stable  Complications: No apparent anesthesia complications

## 2013-02-19 ENCOUNTER — Encounter (HOSPITAL_COMMUNITY)
Admission: RE | Disposition: A | Discharge status: Home / Self Care | Payer: Self-pay | Source: Ambulatory Visit | Attending: Surgery

## 2013-02-19 ENCOUNTER — Encounter (HOSPITAL_COMMUNITY): Payer: Self-pay | Admitting: Surgery

## 2013-02-19 ENCOUNTER — Inpatient Hospital Stay (HOSPITAL_COMMUNITY): Payer: Medicare Other

## 2013-02-19 DIAGNOSIS — D3A00 Benign carcinoid tumor of unspecified site: Secondary | ICD-10-CM

## 2013-02-19 DIAGNOSIS — I442 Atrioventricular block, complete: Secondary | ICD-10-CM

## 2013-02-19 DIAGNOSIS — I359 Nonrheumatic aortic valve disorder, unspecified: Principal | ICD-10-CM

## 2013-02-19 DIAGNOSIS — I517 Cardiomegaly: Secondary | ICD-10-CM

## 2013-02-19 HISTORY — PX: PERMANENT PACEMAKER INSERTION: SHX5480

## 2013-02-19 LAB — GLUCOSE, CAPILLARY
Glucose-Capillary: 128 mg/dL — ABNORMAL HIGH (ref 70–99)
Glucose-Capillary: 143 mg/dL — ABNORMAL HIGH (ref 70–99)
Glucose-Capillary: 122 mg/dL — ABNORMAL HIGH (ref 70–99)
Glucose-Capillary: 143 mg/dL — ABNORMAL HIGH (ref 70–99)

## 2013-02-19 LAB — CBC
HCT: 24.7 % — ABNORMAL LOW (ref 39.0–52.0)
MCV: 96.9 fL (ref 78.0–100.0)
Platelets: 95 10*3/uL — ABNORMAL LOW (ref 150–400)
RBC: 2.55 MIL/uL — ABNORMAL LOW (ref 4.22–5.81)
WBC: 6.8 10*3/uL (ref 4.0–10.5)

## 2013-02-19 LAB — BASIC METABOLIC PANEL
CO2: 26 mEq/L (ref 19–32)
Chloride: 104 mEq/L (ref 96–112)
GFR calc Af Amer: >90 mL/min (ref >90)
Sodium: 135 mEq/L (ref 135–145)

## 2013-02-19 SURGERY — PERMANENT PACEMAKER INSERTION
Anesthesia: LOCAL

## 2013-02-19 MED ORDER — CHLORHEXIDINE GLUCONATE 4 % EX LIQD
60.0000 mL | Freq: Once | CUTANEOUS | Status: AC
  Filled 2013-02-19: qty 60

## 2013-02-19 MED ORDER — MIDAZOLAM HCL 5 MG/5ML IJ SOLN
INTRAMUSCULAR | Status: AC
  Filled 2013-02-19: qty 5

## 2013-02-19 MED ORDER — GENTAMICIN SULFATE 40 MG/ML IJ SOLN
80.0000 mg | INTRAMUSCULAR | Status: DC
  Filled 2013-02-19: qty 2

## 2013-02-19 MED ORDER — SODIUM CHLORIDE 0.9 % IV SOLN: INTRAVENOUS | Status: AC

## 2013-02-19 MED ORDER — HEPARIN (PORCINE) IN NACL 2-0.9 UNIT/ML-% IJ SOLN
INTRAMUSCULAR | Status: AC
  Filled 2013-02-19: qty 500

## 2013-02-19 MED ORDER — ACETAMINOPHEN 325 MG PO TABS: 325.0000 mg | ORAL_TABLET | ORAL | Status: DC | PRN

## 2013-02-19 MED ORDER — LIDOCAINE HCL (PF) 1 % IJ SOLN
INTRAMUSCULAR | Status: AC
  Filled 2013-02-19: qty 60

## 2013-02-19 MED ORDER — SODIUM CHLORIDE 0.9 % IV SOLN
INTRAVENOUS | Status: DC
  Administered 2013-02-19: 15:00:00 via INTRAVENOUS

## 2013-02-19 MED ORDER — CHLORHEXIDINE GLUCONATE 4 % EX LIQD
60.0000 mL | Freq: Once | CUTANEOUS | Status: AC
  Administered 2013-02-19: 4 via TOPICAL
  Filled 2013-02-19: qty 60

## 2013-02-19 MED ORDER — VANCOMYCIN HCL IN DEXTROSE 1-5 GM/200ML-% IV SOLN
1000.0000 mg | Freq: Two times a day (BID) | INTRAVENOUS | Status: AC
  Administered 2013-02-20: 1000 mg via INTRAVENOUS
  Filled 2013-02-19: qty 200

## 2013-02-19 MED ORDER — FENTANYL CITRATE 0.05 MG/ML IJ SOLN
INTRAMUSCULAR | Status: AC
  Filled 2013-02-19: qty 2

## 2013-02-19 MED ORDER — ONDANSETRON HCL 4 MG/2ML IJ SOLN: 4.0000 mg | Freq: Four times a day (QID) | INTRAMUSCULAR | Status: DC | PRN

## 2013-02-19 MED ORDER — VANCOMYCIN HCL IN DEXTROSE 1-5 GM/200ML-% IV SOLN
1000.0000 mg | INTRAVENOUS | Status: DC
  Filled 2013-02-19: qty 200

## 2013-02-19 MED ORDER — FUROSEMIDE 40 MG PO TABS
40.0000 mg | ORAL_TABLET | Freq: Every day | ORAL | Status: AC
  Administered 2013-02-19 – 2013-02-20 (×2): 40 mg via ORAL
  Filled 2013-02-19 (×3): qty 1

## 2013-02-19 MED ORDER — POTASSIUM CHLORIDE CRYS ER 20 MEQ PO TBCR
40.0000 meq | EXTENDED_RELEASE_TABLET | Freq: Every day | ORAL | Status: AC
  Administered 2013-02-19 – 2013-02-20 (×2): 40 meq via ORAL
  Filled 2013-02-19 (×3): qty 2

## 2013-02-19 MED FILL — Heparin Sodium (Porcine) Inj 1000 Unit/ML: INTRAMUSCULAR | Qty: 30 | Status: AC

## 2013-02-19 MED FILL — Sodium Chloride IV Soln 0.9%: INTRAVENOUS | Qty: 1000 | Status: AC

## 2013-02-19 MED FILL — Magnesium Sulfate Inj 50%: INTRAMUSCULAR | Qty: 10 | Status: AC

## 2013-02-19 MED FILL — Potassium Chloride Inj 2 mEq/ML: INTRAVENOUS | Qty: 40 | Status: AC

## 2013-02-19 NOTE — Anesthesia Postprocedure Evaluation (Signed)
  Anesthesia Post-op Note  Patient: [name]  Procedure(s) Performed: Procedure(s): AORTIC VALVE REPLACEMENT (AVR) (N/A) INTRAOPERATIVE TRANSESOPHAGEAL ECHOCARDIOGRAM (N/A)  Patient Location: ICU  Anesthesia Type:General  Level of Consciousness: alert  and oriented  Airway and Oxygen Therapy: Patient Spontanous Breathing and Patient connected to nasal cannula oxygen  Post-op Pain: moderate  Post-op Assessment: Post-op Vital signs reviewed, Patient's Cardiovascular Status Stable, Respiratory Function Stable, Patent Airway, No signs of Nausea or vomiting, Adequate PO intake and Pain level controlled  Post-op Vital Signs: Reviewed and stable  Complications: No apparent anesthesia complications

## 2013-02-19 NOTE — Progress Notes (Signed)
*  PRELIMINARY RESULTS* Echocardiogram 2D Echocardiogram has been performed.  [name] 02/19/2013, 1:42 PM

## 2013-02-19 NOTE — CV Procedure (Signed)
Preop DX:: CHB  Post op DX:: same  Procedure  dual pacemaker implantation; contrast venogram  After routine prep and drape, lidocaine was infiltrated in the prepectoral subclavicular region on the left side an incision was made and carried down to later the prepectoral fascia using electrocautery and sharp dissection a pocket was formed similarly. Hemostasis was obtained.  After this, we turned our attention to gaining access to the extrathoracic,left subclavian vein. This was accomplished with much  difficulty requiring a venogram which demonstrated a cephalad course  There was no   aspiration of air or puncture of the artery. A single   Venipuncture was accomplished and double wired; guidewires were placed and retained and sequentially 7 French sheath through which were  passed an Medtronic  ventricular lead serial number  [id number] and an Medtronic 5076 atrial lead serial number [id number] .  The ventricular lead was manipulated to the right ventricular apex with a bipolar R wave was 11.3, the pacing impedance was 870, the threshold was 1.8 @ 0.5 msec  Current at threshold was   2.4  Ma and the current of injury was  brisk.  The right atrial lead was manipulated to the right atrial appendage  with a bipolar P-wave  3.3, the pacing impedance was 827, the threshold 1.5@ 0.52.0 msec   Current at threshold was 2.0  Ma and the current of injury was brisk.  T . The leads were affixed to the prepectoral fascia and attached to a  Medtronic adaptaL  pulse generator serial number [id number].  Hemostasis was obtained. The pocket was copiously irrigated with antibiotic containing saline solution. The leads and the pulse generator were placed in the pocket and affixed to the prepectoral fascia. The wound was then closed in 3 layers in the normal fashion. The wound was washed dried and a benzoin Steri-Strip dressing was applied.  Needle  Count, sponge counts and instrument counts were correct at the end of the  procedure .   The patient tolerated the procedure without apparent complication.  Gerlene Burdock.D.

## 2013-02-19 NOTE — Progress Notes (Addendum)
2 Days Post-Op Procedure(s) (LRB): AORTIC VALVE REPLACEMENT (AVR) (N/A) INTRAOPERATIVE TRANSESOPHAGEAL ECHOCARDIOGRAM (N/A) Subjective: No complaints  Objective: Vital signs in last 24 hours: Temp:  [98.3 F (36.8 C)-99 F (37.2 C)] 98.3 F (36.8 C) (06/25 0740) Pulse Rate:  [84-87] 84 (06/25 0800) Cardiac Rhythm:  [-] A-V Sequential paced (06/25 0800) Resp:  [11-24] 21 (06/25 0800) BP: (95-128)/(36-57) 115/48 mmHg (06/25 0800) SpO2:  [91 %-100 %] 98 % (06/25 0800) Arterial Line BP: (116-129)/(46-50) 129/46 mmHg (06/24 1000) Weight:  [61.9 kg (136 lb 7.4 oz)] 61.9 kg (136 lb 7.4 oz) (06/25 0700)  Hemodynamic parameters for last 24 hours: PAP: (20-26)/(7-14) 20/7 mmHg CO:  [5.8 L/min] 5.8 L/min CI:  [3.7 L/min/m2] 3.7 L/min/m2  Intake/Output from previous day: 06/24 0701 - 06/25 0700 In: 1513.5 [P.O.:900; I.V.:363.5; IV Piggyback:250] Out: 825 [Urine:790; Chest Tube:35] Intake/Output this shift: Total I/O In: 60 [P.O.:60] Out: 125 [Urine:125]  General appearance: alert and cooperative Neurologic: intact Heart: regular rate and rhythm and normal prosthetic valve sounds Lungs: clear to auscultation bilaterally Extremities: extremities normal, atraumatic, no cyanosis or edema Wound: incision ok  Lab Results:  Recent Labs  02/18/13 1700 02/18/13 1710 02/19/13 0400  WBC 6.4  --  6.8  HGB 8.7* 8.5* 8.4*  HCT 26.3* 25.0* 24.7*  PLT 99*  --  95*   BMET:  Recent Labs  02/18/13 0445  02/18/13 1710 02/19/13 0400  NA 142  --  140 135  K 3.5  --  4.1 3.9  CL 110  --  104 104  CO2 22  --   --  26  GLUCOSE 104*  --  143* 160*  BUN 18  --  16 19  CREATININE 0.95  < > 0.90 0.97  CALCIUM 7.6*  --   --  7.7*  < > = values in this interval not displayed.  PT/INR:  Recent Labs  02/17/13 1308  LABPROT 17.6*  INR 1.49   ABG    Component Value Date/Time   PHART 7.242* 02/17/2013 1821   HCO3 23.4 02/17/2013 1821   TCO2 24 02/18/2013 1710   ACIDBASEDEF 4.0*  02/17/2013 1821   O2SAT 99.0 02/17/2013 1821   CBG (last 3)   Recent Labs  02/18/13 1937 02/18/13 2329 02/19/13 0400  GLUCAP 130* 128* 143*    Assessment/Plan: S/P Procedure(s) (LRB): AORTIC VALVE REPLACEMENT (AVR) (N/A) INTRAOPERATIVE TRANSESOPHAGEAL ECHOCARDIOGRAM (N/A)  He remains in complete heart block with no ventricular escape and will likely need permanent pacer. Will consult EP. He is doing well otherwise. Continue mobilization. He needs to stay in the ICU since he is totally pacer dependent. Expected acute blood loss anemia: observe.  LOS: 2 days    Evelene Croon K 02/19/2013

## 2013-02-19 NOTE — Progress Notes (Signed)
Subjective:   POD #2. Sitting up in chair this am. Looks good. No complaints x for mild chest soreness.   Remains with CHB. EP to see later today   Intake/Output Summary (Last 24 hours) at 02/19/13 2317 Last data filed at 02/19/13 2200  Gross per 24 hour  Intake   1550 ml  Output   1630 ml  Net    -80 ml    Current meds: . acetaminophen  1,000 mg Oral Q6H   Or  . acetaminophen (TYLENOL) oral liquid 160 mg/5 mL  975 mg Per Tube Q6H  . aspirin EC  325 mg Oral Daily   Or  . aspirin  324 mg Per Tube Daily  . atorvastatin  40 mg Oral Daily  . bisacodyl  10 mg Oral Daily   Or  . bisacodyl  10 mg Rectal Daily  . busPIRone  15 mg Oral Q breakfast  . busPIRone  30 mg Oral QPC lunch  . cholestyramine light  4 g Oral BID  . cycloSPORINE  1 drop Both Eyes BID  . docusate sodium  200 mg Oral Daily  . enoxaparin (LOVENOX) injection  40 mg Subcutaneous QHS  . fenofibrate  160 mg Oral Daily  . fluticasone  2 spray Each Nare Daily  . furosemide  40 mg Oral Daily  . gabapentin  300 mg Oral Q breakfast  . gabapentin  600 mg Oral QPC lunch  . pantoprazole  40 mg Oral Daily  . potassium chloride  40 mEq Oral Daily  . sertraline  100 mg Oral Daily  . sodium chloride  3 mL Intravenous Q12H  . traZODone  150 mg Oral QHS  . [START ON 02/20/2013] vancomycin  1,000 mg Intravenous Q12H   Infusions: . sodium chloride Stopped (02/18/13 1200)  . sodium chloride 20 mL/hr (02/17/13 1342)  . sodium chloride    . dexmedetomidine Stopped (02/17/13 1541)  . lactated ringers Stopped (02/18/13 1030)  . nitroGLYCERIN Stopped (02/17/13 1437)  . phenylephrine (NEO-SYNEPHRINE) Adult infusion Stopped (02/18/13 0745)     Objective:  Blood pressure 115/42, pulse 93, temperature 98.1 F (36.7 C), temperature source Oral, resp. rate 19, height 5\' 4"  (1.626 m), weight 61.9 kg (136 lb 7.4 oz), SpO2 95.00%. Weight change: 1.8 kg (3 lb 15.5 oz)  Physical Exam: General:  Well appearing. No resp  difficulty HEENT: normal Neck: supple. JVP mildly elevated. Carotids 2+ bilat; no bruits. No lymphadenopathy or thryomegaly appreciated. Cor: PMI nondisplaced. Regular rate & rhythm.  Incision looks good. Lungs: clear decreased at basees.  Abdomen: soft, nontender, nondistended. No hepatosplenomegaly. No bruits or masses. Good bowel sounds. Extremities: no cyanosis, clubbing, rash, tr edema Neuro: alert & orientedx3, cranial nerves grossly intact. moves all 4 extremities w/o difficulty. Affect pleasant  Telemetry:  A-sensed v-paced. No AV conduction under pacer.  Lab Results: Basic Metabolic Panel:  Recent Labs Lab 02/13/13 1501  02/17/13 1250 02/17/13 1817 02/17/13 1825 02/18/13 0445 02/18/13 1700 02/18/13 1710 02/19/13 0400  NA 137  < > 143 146*  --  142  --  140 135  K 4.0  < > 4.2 4.1  --  3.5  --  4.1 3.9  CL 104  --   --  113*  --  110  --  104 104  CO2 23  --   --   --   --  22  --   --  26  GLUCOSE 117*  < > 82 149*  --  104*  --  143* 160*  BUN 20  --   --  19  --  18  --  16 19  CREATININE 0.99  --   --  0.90 1.09 0.95 0.93 0.90 0.97  CALCIUM 8.9  --   --   --   --  7.6*  --   --  7.7*  MG  --   --   --   --  3.4* 2.4 2.2  --   --   < > = values in this interval not displayed. Liver Function Tests:  Recent Labs Lab 02/13/13 1501  AST 26  ALT 18  ALKPHOS 59  BILITOT 0.3  PROT 6.1  ALBUMIN 3.3*   No results found for this basename: LIPASE, AMYLASE,  in the last 168 hours No results found for this basename: AMMONIA,  in the last 168 hours CBC:  Recent Labs Lab 02/17/13 1308  02/17/13 1825 02/18/13 0445 02/18/13 1700 02/18/13 1710 02/19/13 0400  WBC 3.9*  --  10.8* 7.6 6.4  --  6.8  HGB 9.9*  < > 10.6* 9.2* 8.7* 8.5* 8.4*  HCT 28.8*  < > 31.2* 27.1* 26.3* 25.0* 24.7*  MCV 95.4  --  96.3 95.8 97.0  --  96.9  PLT 108*  --  173 112* 99*  --  95*  < > = values in this interval not displayed. Cardiac Enzymes: No results found for this basename:  CKTOTAL, CKMB, CKMBINDEX, TROPONINI,  in the last 168 hours BNP: No components found with this basename: POCBNP,  CBG:  Recent Labs Lab 02/18/13 1937 02/18/13 2329 02/19/13 0400 02/19/13 0737 02/19/13 1203  GLUCAP 130* 128* 143* 122* 143*   Microbiology: No results found for this basename: cult   No results found for this basename: CULT, SDES,  in the last 168 hours  Imaging: Dg Chest Portable 1 View  02/19/2013   *RADIOLOGY REPORT*  Clinical Data: 72 year old male with cardiac pacemaker implant.  PORTABLE CHEST - 1 VIEW  Comparison: 0619 hours the same day and earlier.  Findings: Semi upright AP portable view 1919 hours.  New left chest dual lead subclavian vein approach cardiac pacemaker.  No pneumothorax identified.  Leads terminate in the right atrium and right ventricle region.  Left pleural effusion less apparent. Small right effusion again suspected. Decreased vascular congestion and marrow overt pulmonary edema.  No other confluent pulmonary opacity.  Incidental azygos fissure.  Right IJ central line removed.  Sequelae of cardiac valve replacement.  Stable cardiac size and mediastinal contours.  Epicardial pacer wires remain.  IMPRESSION: 1.  Left chest cardiac pacemaker placed. Right IJ introducer sheath removed.  No pneumothorax or adverse features identified. 2.  Stable or decreased small pleural effusions.  No new cardiopulmonary abnormality.   Original Report Authenticated By: Erskine Speed, M.D.   Dg Chest Port 1 View  02/19/2013   *RADIOLOGY REPORT*  Clinical Data: Postop aortic valve replacement  PORTABLE CHEST - 1 VIEW  Comparison: Portable chest x-ray of 02/18/2013  Findings: The lungs are not as well aerated with basilar atelectasis right greater than left.  There may be minimal interstitial edema present.  Cardiomegaly is stable.  The Swan-Ganz catheter has been removed with a venous sheath remaining.  No pneumothorax is seen.  IMPRESSION: Diminished aeration with increase  in bibasilar atelectasis. Question mild interstitial edema.   Original Report Authenticated By: Dwyane Dee, M.D.   Dg Chest Portable 1 View In Am  02/18/2013   *  RADIOLOGY REPORT*  Clinical Data: Postop aortic valve replacement.  PORTABLE CHEST - 1 VIEW  Comparison: 02/17/2013  Findings: Interval extubation and removal of NG tube.  Swan-Ganz catheter remains in place.  Slight increasing bibasilar atelectasis.  Heart is normal size.  No effusions or acute bony abnormality.  No pneumothorax.  IMPRESSION: Slight increasing bibasilar atelectasis following extubation.   Original Report Authenticated By: Charlett Nose, M.D.     ASSESSMENT:  1. AS s/p AVR (bioprosthetic) POD#2 2. CHB 3. Baseline LBBB 4. 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 Remains with CHB. EP to see later today. Agree with oral lasix for slow diuresis. Weight still up 7 pounds. No evidence of serotonin syndrome.   LOS: 2 days    Tyler Meres, MD 02/19/2013, 11:17 PM

## 2013-02-19 NOTE — Progress Notes (Signed)
T. CTS p.m. Rounds  Stable after having permanent transvenous pacemaker placed for heart block via left subclavian vein No evidence of site pocket hematoma Resting comfortably in a paced rhythm

## 2013-02-19 NOTE — Consult Note (Signed)
ELECTROPHYSIOLOGY CONSULT NOTE    Patient ID: [name] MRN: [id number], DOB/AGE: [birth year] 72 y.o.  Admit date: 02/17/2013 Date of Consult: 02-19-2013  Primary Physician: Emeterio Reeve, MD Primary Cardiologist: Nicholes Mango, MD  Reason for Consultation: complete heart block status post AVR  HPI:  [name] is a 72 year old male with a history of aortic stenosis, hyperlipidemia, left bundle branch block, diabetes, and testicular cancer (s/p XRT and surgical resection).  He was admitted 02-17-2013 with planned AVR.  This was carried out by Dr Laneta Simmers. He received a bioprosthesis  Post-op, [name] has had complete heart block with no ventricular escape.  He otherwise has had an unremarkable post op course and is feeling fairly well.  He is ambulating in the hallways without difficulty.    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 Lattie Corns. In June will have follow up CT. If tumor progressing will start injections, ocreotide.  EP has been asked to evaluate for treatment options.   Past Medical History  Diagnosis Date  . Aortic stenosis     moderate. Echo 3/10: EF 55-60%. mean gradient 25 mmHg. AVA 1.23cm2. Echo 3/11: Normal EF. mean AVA gradient . exercise myoview 2008: positive ECG. normal perfusion  . Exposure to secondhand smoke     extensive exposure  . Hyperlipidemia     treated  . Depression   . Asthma   . Heart murmur   . LBBB (left bundle branch block)   . Shortness of breath     exertion  . DM2 (diabetes mellitus, type 2)     fasting blood sugar - 90s  . Dizziness   . Testicular cancer     s/p XRT and surgical resection.  . Chronic lymphocytic leukemia     followed by Dr. Candyce Churn     Surgical History:  Past Surgical History  Procedure Laterality Date  . Xrt and surgical resection      s/p. 10 years ago  .  Stapendectomy  07/30/02    right  . Inner ear surgery    . Cyst removal neck       Prescriptions prior to admission  Medication Sig Dispense Refill  . acetaminophen (TYLENOL) 500 MG tablet Take 500 mg by mouth 3 (three) times daily.       Marland Kitchen aspirin EC 81 MG tablet Take 81 mg by mouth daily.      Marland Kitchen atorvastatin (LIPITOR) 40 MG tablet Take 40 mg by mouth daily.      . bifidobacterium infantis (ALIGN) capsule Take 1 capsule by mouth daily.      . busPIRone (BUSPAR) 15 MG tablet Take 15-30 mg by mouth 2 (two) times daily. One tablet every morning and two tablets every day at noon      . Cholestyramine POWD Take 1 scoop by mouth 2 (two) times daily.       . cycloSPORINE (RESTASIS) 0.05 % ophthalmic emulsion Place 1 drop into both eyes 2 (two) times daily.       Marland Kitchen desloratadine (CLARINEX) 5 MG tablet Take 5 mg by mouth daily.       . fenofibrate 160 MG tablet Take 160 mg by mouth daily.       . fluticasone (FLONASE) 50 MCG/ACT nasal spray Place 2 sprays into the nose daily.       Marland Kitchen FORA V12 BLOOD GLUCOSE TEST test  strip       . furosemide (LASIX) 20 MG tablet Take 40 mg by mouth 2 (two) times daily. Take 2 tabs in AM and 2 tabs in the PM      . gabapentin (NEURONTIN) 300 MG capsule Take 300 mg by mouth 2 (two) times daily. One capsule in the morning and 2 capsules at noon      . LITETOUCH LANCETS MISC       . Multiple Vitamin (MULTIVITAMIN) tablet Take 1 tablet by mouth daily.       Bertram Gala Glycol-Propyl Glycol (SYSTANE OP) Place 1 drop into both eyes 2 (two) times daily.      . potassium chloride SA (K-DUR,KLOR-CON) 20 MEQ tablet Take 1 tablet (20 mEq total) by mouth daily.  30 tablet  6  . PROCTOZONE-HC 2.5 % rectal cream Place 1 application rectally 2 (two) times daily.       . sertraline (ZOLOFT) 100 MG tablet Take 100 mg by mouth daily.       . traZODone (DESYREL) 50 MG tablet Take 150 mg by mouth at bedtime.         Inpatient Medications:  . acetaminophen  1,000 mg Oral Q6H   Or    . acetaminophen (TYLENOL) oral liquid 160 mg/5 mL  975 mg Per Tube Q6H  . aspirin EC  325 mg Oral Daily   Or  . aspirin  324 mg Per Tube Daily  . atorvastatin  40 mg Oral Daily  . bisacodyl  10 mg Oral Daily   Or  . bisacodyl  10 mg Rectal Daily  . busPIRone  15 mg Oral Q breakfast  . busPIRone  30 mg Oral QPC lunch  . chlorhexidine  60 mL Topical Once  . chlorhexidine  60 mL Topical Once  . cholestyramine light  4 g Oral BID  . cycloSPORINE  1 drop Both Eyes BID  . docusate sodium  200 mg Oral Daily  . enoxaparin (LOVENOX) injection  40 mg Subcutaneous QHS  . fenofibrate  160 mg Oral Daily  . fluticasone  2 spray Each Nare Daily  . furosemide  40 mg Oral Daily  . gabapentin  300 mg Oral Q breakfast  . gabapentin  600 mg Oral QPC lunch  . gentamicin irrigation  80 mg Irrigation On Call  . pantoprazole  40 mg Oral Daily  . potassium chloride  40 mEq Oral Daily  . sertraline  100 mg Oral Daily  . sodium chloride  3 mL Intravenous Q12H  . traZODone  150 mg Oral QHS  . vancomycin  1,000 mg Intravenous On Call    Allergies:  Allergies  Allergen Reactions  . Banana Anaphylaxis  . Penicillins Rash    History   Social History  . Marital Status: Married    Spouse Name: N/A    Number of Children: N/A  . Years of Education: N/A   Occupational History  . Not on file.   Social History Main Topics  . Smoking status: Never Smoker   . Smokeless tobacco: Not on file  . Alcohol Use: No  . Drug Use: No  . Sexually Active: Not on file   Other Topics Concern  . Not on file   Social History Narrative   Married and has 4 children. Has significant exposure to 2nd hand smoke. Works as a Actor.      BP 81/45  Pulse 85  Temp(Src) 98.5 F (36.9 C) (Oral)  Resp 13  Ht 5\' 4"  (1.626 m)  Wt 136 lb 7.4 oz (61.9 kg)  BMI 23.41 kg/m2  SpO2 92%  Alert and oriented in no acute distress HENT- normal Eyes- EOMI, without scleral icterus Skin- warm and dry;  without rashes LN-neg Neck- supple without thyromegaly, JVP-flat, carotids brisk and full without bruits Back-without CVAT or kyphosis Lungs-clear to auscultation Medain sternotomy CV-Regular rate and rhythm, nl S1 and S2, no murmurs gallops or rubs, S4-absent Abd-soft with active bowel sounds; no midline pulsation or hepatomegaly Pulses-intact femoral and distal MKS-without gross deformity Neuro- Ax O, CN3-12 intact, grossly normal motor and sensory function Affect engaging   Labs:   Lab Results  Component Value Date   WBC 6.8 02/19/2013   HGB 8.4* 02/19/2013   HCT 24.7* 02/19/2013   MCV 96.9 02/19/2013   PLT 95* 02/19/2013    Recent Labs Lab 02/13/13 1501  02/19/13 0400  NA 137  < > 135  K 4.0  < > 3.9  CL 104  < > 104  CO2 23  < > 26  BUN 20  < > 19  CREATININE 0.99  < > 0.97  CALCIUM 8.9  < > 7.7*  PROT 6.1  --   --   BILITOT 0.3  --   --   ALKPHOS 59  --   --   ALT 18  --   --   AST 26  --   --   GLUCOSE 117*  < > 160*  < > = values in this interval not displayed.   Radiology/Studies: Dg Chest 2 View 02/13/2013   *RADIOLOGY REPORT*  Clinical Data: Preop AVR  CHEST - 2 VIEW  Comparison: 10/11/2007  Findings: Cardiomediastinal silhouette is stable.  No acute infiltrate or pleural effusion.  No pulmonary edema.  Mild hyperinflation .  Accessory azygos fissure again noted.  IMPRESSION: No active disease.  Hyperinflation again noted.   Original Report Authenticated By: Natasha Mead, M.D.   EKG: sinus rhythm with LBBB, QRS  TELEMETRY: P synchronous pacing  Active Problems:   Aortic stenosis   Complete heart block  Pt with extensive dissection at the time of AVR 2/2 severely calcified annulus  Pt was device dependent immediatley following procedure and has remained without escape beats since.  Given the extensive surgical debulking esp in setting of baseline LBBB i think it is exceedingly unlikely that conduction will recover  The benefits and risks were reviewed  including but not limited to death,  perforation, infection, lead dislodgement and device malfunction.  The patient understands agrees and is willing to proceed.

## 2013-02-20 MED ORDER — BISACODYL 10 MG RE SUPP: 10.0000 mg | Freq: Every day | RECTAL | Status: DC | PRN

## 2013-02-20 MED ORDER — SODIUM CHLORIDE 0.9 % IJ SOLN
3.0000 mL | Freq: Two times a day (BID) | INTRAMUSCULAR | Status: DC
  Administered 2013-02-20 – 2013-02-22 (×4): 3 mL via INTRAVENOUS

## 2013-02-20 MED ORDER — TRAMADOL HCL 50 MG PO TABS
50.0000 mg | ORAL_TABLET | ORAL | Status: DC | PRN
  Administered 2013-02-20: 50 mg via ORAL
  Filled 2013-02-20: qty 1

## 2013-02-20 MED ORDER — ACETAMINOPHEN 325 MG PO TABS
650.0000 mg | ORAL_TABLET | Freq: Four times a day (QID) | ORAL | Status: DC | PRN
  Filled 2013-02-20: qty 2

## 2013-02-20 MED ORDER — DOCUSATE SODIUM 100 MG PO CAPS
200.0000 mg | ORAL_CAPSULE | Freq: Every day | ORAL | Status: DC
  Filled 2013-02-20: qty 2

## 2013-02-20 MED ORDER — FUROSEMIDE 40 MG PO TABS
40.0000 mg | ORAL_TABLET | Freq: Every day | ORAL | Status: AC
  Administered 2013-02-21 – 2013-02-22 (×2): 40 mg via ORAL
  Filled 2013-02-20 (×2): qty 1

## 2013-02-20 MED ORDER — ONDANSETRON HCL 4 MG/2ML IJ SOLN: 4.0000 mg | Freq: Four times a day (QID) | INTRAMUSCULAR | Status: DC | PRN

## 2013-02-20 MED ORDER — OXYCODONE HCL 5 MG PO TABS: 5.0000 mg | ORAL_TABLET | ORAL | Status: DC | PRN

## 2013-02-20 MED ORDER — ONDANSETRON HCL 4 MG PO TABS: 4.0000 mg | ORAL_TABLET | Freq: Four times a day (QID) | ORAL | Status: DC | PRN

## 2013-02-20 MED ORDER — SODIUM CHLORIDE 0.9 % IJ SOLN: 3.0000 mL | INTRAMUSCULAR | Status: DC | PRN

## 2013-02-20 MED ORDER — BISACODYL 5 MG PO TBEC: 10.0000 mg | DELAYED_RELEASE_TABLET | Freq: Every day | ORAL | Status: DC | PRN

## 2013-02-20 MED ORDER — MOVING RIGHT ALONG BOOK
Freq: Once | Status: AC
  Administered 2013-02-20: 11:00:00
  Filled 2013-02-20: qty 1

## 2013-02-20 MED ORDER — POTASSIUM CHLORIDE CRYS ER 20 MEQ PO TBCR
20.0000 meq | EXTENDED_RELEASE_TABLET | Freq: Two times a day (BID) | ORAL | Status: DC
  Administered 2013-02-21 – 2013-02-22 (×3): 20 meq via ORAL
  Filled 2013-02-20 (×4): qty 1

## 2013-02-20 MED ORDER — ASPIRIN EC 325 MG PO TBEC
325.0000 mg | DELAYED_RELEASE_TABLET | Freq: Every day | ORAL | Status: DC
  Administered 2013-02-21 – 2013-02-22 (×2): 325 mg via ORAL
  Filled 2013-02-20 (×2): qty 1

## 2013-02-20 MED ORDER — SODIUM CHLORIDE 0.9 % IV SOLN: 250.0000 mL | INTRAVENOUS | Status: DC | PRN

## 2013-02-20 MED ORDER — FAMOTIDINE 20 MG PO TABS
20.0000 mg | ORAL_TABLET | Freq: Two times a day (BID) | ORAL | Status: DC
  Administered 2013-02-20 – 2013-02-22 (×5): 20 mg via ORAL
  Filled 2013-02-20 (×6): qty 1

## 2013-02-20 MED FILL — Sodium Bicarbonate IV Soln 8.4%: INTRAVENOUS | Qty: 50 | Status: AC

## 2013-02-20 MED FILL — Lidocaine HCl IV Inj 20 MG/ML: INTRAVENOUS | Qty: 5 | Status: AC

## 2013-02-20 MED FILL — Mannitol IV Soln 20%: INTRAVENOUS | Qty: 500 | Status: AC

## 2013-02-20 MED FILL — Sodium Chloride Irrigation Soln 0.9%: Qty: 3000 | Status: AC

## 2013-02-20 MED FILL — Electrolyte-R (PH 7.4) Solution: INTRAVENOUS | Qty: 4000 | Status: AC

## 2013-02-20 MED FILL — Heparin Sodium (Porcine) Inj 1000 Unit/ML: INTRAMUSCULAR | Qty: 10 | Status: AC

## 2013-02-20 NOTE — Progress Notes (Signed)
1 Day Post-Op Procedure(s) (LRB): PERMANENT PACEMAKER INSERTION (N/A) Subjective: No complaints.  Objective: Vital signs in last 24 hours: Temp:  [98.1 F (36.7 C)-99 F (37.2 C)] 98.9 F (37.2 C) (06/26 0741) Pulse Rate:  [79-96] 95 (06/26 0700) Cardiac Rhythm:  [-] A-V Sequential paced (06/26 0400) Resp:  [13-23] 22 (06/26 0700) BP: (81-129)/(37-60) 115/48 mmHg (06/26 0700) SpO2:  [91 %-100 %] 97 % (06/26 0700) Weight:  [61.3 kg (135 lb 2.3 oz)] 61.3 kg (135 lb 2.3 oz) (06/26 0500)  Hemodynamic parameters for last 24 hours:    Intake/Output from previous day: 06/25 0701 - 06/26 0700 In: 1730 [P.O.:1140; I.V.:390; IV Piggyback:200] Out: 2020 [Urine:2020] Intake/Output this shift:    General appearance: alert and cooperative Neurologic: intact Heart: regular rate and rhythm and normal prosthetic valve sounds Lungs: clear to auscultation bilaterally Extremities: extremities normal, atraumatic, no cyanosis or edema Wound: chest incision ok  Lab Results:  Recent Labs  02/18/13 1700 02/18/13 1710 02/19/13 0400  WBC 6.4  --  6.8  HGB 8.7* 8.5* 8.4*  HCT 26.3* 25.0* 24.7*  PLT 99*  --  95*   BMET:  Recent Labs  02/18/13 0445  02/18/13 1710 02/19/13 0400  NA 142  --  140 135  K 3.5  --  4.1 3.9  CL 110  --  104 104  CO2 22  --   --  26  GLUCOSE 104*  --  143* 160*  BUN 18  --  16 19  CREATININE 0.95  < > 0.90 0.97  CALCIUM 7.6*  --   --  7.7*  < > = values in this interval not displayed.  PT/INR:  Recent Labs  02/17/13 1308  LABPROT 17.6*  INR 1.49   ABG    Component Value Date/Time   PHART 7.242* 02/17/2013 1821   HCO3 23.4 02/17/2013 1821   TCO2 24 02/18/2013 1710   ACIDBASEDEF 4.0* 02/17/2013 1821   O2SAT 99.0 02/17/2013 1821   CBG (last 3)   Recent Labs  02/19/13 0400 02/19/13 0737 02/19/13 1203  GLUCAP 143* 122* 143*    Assessment/Plan: S/p AVR with postop CHB S/P permanent pacer yesterday Pacer is functioning well Will remove  temporary wires tomorrow Mobilize Plan for transfer to step-down: see transfer orders He may be ready to go home by Saturday.   LOS: 3 days    Evelene Croon K 02/20/2013

## 2013-02-20 NOTE — Plan of Care (Signed)
Problem: Phase III Progression Outcomes Goal: Time patient transferred to PCTU/Telemetry POD Outcome: Completed/Met Date Met:  02/20/13 1100-pt ambulated in unit 2300 then ambulated to 2020, no complications or dysrhythmia during ambulation. VS stable prior and during the transfer. Pt settled in bed in 2020, receiving RN in room. Patient's spouse arrived to 2020 just as patient is settled in, patient's daughter Kennyth Arnold) updated and given room # over the phone. No personal belongings at bedside of 2304 that could be taken up to patient's new room. Report given to receiving RN. Ellionna Buckbee, Charity fundraiser.

## 2013-02-20 NOTE — Progress Notes (Addendum)
CARDIAC REHAB PHASE I   PRE:  Rate/Rhythm: 106 Pacing  BP:  Sitting: 140/62     SaO2: 100 RA  MODE:  Ambulation: 550 ft   POST:  Rate/Rhythm: 119 ST  BP:  Sitting: 140/60     SaO2: 100 RA  14:50-15:42 Patient tolerated walk well.  Patient used a walker and also needs a walker for home. Patient had no complaints. Patient was educated on discharge precautions and video was set up with he and family.  Patient interested in cardiac rehab.  Theresa Duty, [place] 02/20/2013 3:37 PM

## 2013-02-20 NOTE — Progress Notes (Signed)
ELECTROPHYSIOLOGY ROUNDING NOTE    Patient Name: [name] Date of Encounter: 02-20-2013    SUBJECTIVE:Patient feels  better  S/p dual chamber pacemaker implant 02-19-2013 for CHB post AVR  TELEMETRY: Reviewed telemetry pt in sinus rhythm with ventricular pacing Filed Vitals:   02/20/13 0500 02/20/13 0600 02/20/13 0700 02/20/13 0741  BP: 106/41 118/60 115/48   Pulse: 88 96 95   Temp:    98.9 F (37.2 C)  TempSrc:    Oral  Resp: 16 23 22    Height:      Weight: 135 lb 2.3 oz (61.3 kg)     SpO2: 100% 93% 97%     Well developed and nourished in no acute distress HENT normal Neck supple with JVP-flat Clear Pocket without hematoma Regular rate and rhythm, no murmurs or gallops Abd-soft with active BS No Clubbing cyanosis edema Skin-warm and dry A & Oriented  Grossly normal sensory and motor function    Intake/Output Summary (Last 24 hours) at 02/20/13 0825 Last data filed at 02/20/13 0600  Gross per 24 hour  Intake   1670 ml  Output   1895 ml  Net   -225 ml    LABS: Basic Metabolic Panel:  Recent Labs  40/98/11 0445 02/18/13 1700 02/18/13 1710 02/19/13 0400  NA 142  --  140 135  K 3.5  --  4.1 3.9  CL 110  --  104 104  CO2 22  --   --  26  GLUCOSE 104*  --  143* 160*  BUN 18  --  16 19  CREATININE 0.95 0.93 0.90 0.97  CALCIUM 7.6*  --   --  7.7*  MG 2.4 2.2  --   --    CBC:  Recent Labs  02/18/13 1700 02/18/13 1710 02/19/13 0400  WBC 6.4  --  6.8  HGB 8.7* 8.5* 8.4*  HCT 26.3* 25.0* 24.7*  MCV 97.0  --  96.9  PLT 99*  --  95*    Radiology/Studies:    Dg Chest Portable 1 View 02/19/2013   *RADIOLOGY REPORT*  Clinical Data: 72 year old male with cardiac pacemaker implant.  PORTABLE CHEST - 1 VIEW  Comparison: 0619 hours the same day and earlier.  Findings: Semi upright AP portable view 1919 hours.  New left chest dual lead subclavian vein approach cardiac pacemaker.  No pneumothorax identified.  Leads terminate in the right atrium and  right ventricle region.  Left pleural effusion less apparent. Small right effusion again suspected. Decreased vascular congestion and marrow overt pulmonary edema.  No other confluent pulmonary opacity.  Incidental azygos fissure.  Right IJ central line removed.  Sequelae of cardiac valve replacement.  Stable cardiac size and mediastinal contours.  Epicardial pacer wires remain.  IMPRESSION: 1.  Left chest cardiac pacemaker placed. Right IJ introducer sheath removed.  No pneumothorax or adverse features identified. 2.  Stable or decreased small pleural effusions.  No new cardiopulmonary abnormality.   Original Report Authenticated By: Erskine Speed, M.D.   Dg Chest Port 1 View  02/19/2013   *RADIOLOGY REPORT*  Clinical Data: Postop aortic valve replacement  PORTABLE CHEST - 1 VIEW  Comparison: Portable chest x-ray of 02/18/2013  Findings: The lungs are not as well aerated with basilar atelectasis right greater than left.  There may be minimal interstitial edema present.  Cardiomegaly is stable.  The Swan-Ganz catheter has been removed with a venous sheath remaining.  No pneumothorax is seen.  IMPRESSION: Diminished aeration with increase  in bibasilar atelectasis. Question mild interstitial edema.   Original Report Authenticated By: Dwyane Dee, M.D.   Dg Chest Portable 1 View In Am  02/18/2013   *RADIOLOGY REPORT*  Clinical Data: Postop aortic valve replacement.  PORTABLE CHEST - 1 VIEW  Comparison: 02/17/2013  Findings: Interval extubation and removal of NG tube.  Swan-Ganz catheter remains in place.  Slight increasing bibasilar atelectasis.  Heart is normal size.  No effusions or acute bony abnormality.  No pneumothorax.  IMPRESSION: Slight increasing bibasilar atelectasis following extubation.   Original Report Authenticated By: Charlett Nose, M.D.   Dg Chest Portable 1 View  02/17/2013   *RADIOLOGY REPORT*  Clinical Data: Aortic valve replacement  PORTABLE CHEST - 1 VIEW  Comparison: 02/13/2013  Findings:  There has been median sternotomy and aortic valve replacement.  Endotracheal tube has its tip 5 cm above the carina. Nasogastric tube enters the stomach.  Right internal jugular Swan- Ganz catheter has its tip in the right main pulmonary artery. Mediastinal drains are in place.  There is no pneumothorax.  The lungs are clear.  IMPRESSION: Good appearance following aortic valve replacement.  Lines and tubes well positioned.  Lungs clear.  No pneumothorax.   Original Report Authenticated By: Paulina Fusi, M.D.   Final result pending, leads in stable position.     DEVICE INTERROGATION: Device interrogated by industry.  Lead values including impedence, sensing, threshold within normal values.    Active Problems:   Aortic stenosis   Complete heart block    Signed, Optometrist, BSN Stable post pacing

## 2013-02-20 NOTE — Progress Notes (Signed)
UR Completed.  [name] [phone] 02/20/2013

## 2013-02-21 DIAGNOSIS — Z952 Presence of prosthetic heart valve: Secondary | ICD-10-CM

## 2013-02-21 NOTE — Care Management Note (Unsigned)
    Page 1 of 1   02/21/2013     3:36:32 PM   CARE MANAGEMENT NOTE 02/21/2013  Patient:  [name], [name]   Account Number:  [id number]  Date Initiated:  02/21/2013  Documentation initiated by:  Haygen Zebrowski  Subjective/Objective Assessment:   PT S/P AVR ON 02/17/13.  PTA, PT INDEPENDENT, RESIDES AT HOME WITH SPOUSE.     Action/Plan:   WIFE AND DAUGHTER TO PROVIDE 24HR CARE AT DC.  DENIES ANY NEEDS FOR HOME.   Anticipated DC Date:  02/22/2013   Anticipated DC Plan:  HOME/SELF CARE      DC Planning Services  CM consult      Choice offered to / List presented to:             Status of service:  In process, will continue to follow Medicare Important Message given?   (If response is "NO", the following Medicare IM given date fields will be blank) Date Medicare IM given:   Date Additional Medicare IM given:    Discharge Disposition:    Per UR Regulation:  Reviewed for med. necessity/level of care/duration of stay  If discussed at Long Length of Stay Meetings, dates discussed:    Comments:  02/21/13 Vidyuth Belsito,RN,BSN [phone] DAUGHTER STATES WIFE REQUESTING NUTRITIONAL CONSULT TO DISCUSS HEART HEALTHY AND DIABETIC DIET.  PAGED COVERING DIETICIAN FOR 2000, KATIE LAMBERTION.  SHE STATES SHE WILL SEE PT/WIFE BETWEEN 3:30-4:00PM TODAY, PER THEIR REQUEST.

## 2013-02-21 NOTE — Progress Notes (Signed)
EPW d/c at this time per MD order; bedrest until 1155; pt tolerated well; VSS; will cont. To monitor.

## 2013-02-21 NOTE — Discharge Summary (Signed)
Physician Discharge Summary  Patient ID: CALAB SACHSE MRN: [id number] DOB/AGE: 1941-06-15 72 y.o.  Admit date: 02/17/2013 Discharge date: 02/21/2013  Admission Diagnoses:  Patient Active Problem List   Diagnosis Date Noted  . Complete heart block 02/19/2013  . Chronic diastolic heart failure 01/30/2013  . 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 Aortic valve stenosis, moderate 12/26/2012  . Groin hematoma 12/26/2012  . Aortic stenosis   . 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 HYPERLIPIDEMIA 12/04/2008  . DEPRESSION 12/04/2008  . Aortic valve disorders 12/04/2008  . ASTHMA, CHILDHOOD 12/04/2008  . ASTHMA 12/04/2008   Discharge Diagnoses:   Patient Active Problem List   Diagnosis Date Noted  . S/P AVR (aortic valve replacement) 02/21/2013  . Complete heart block 02/19/2013  . Chronic diastolic heart failure 01/30/2013  . 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 Aortic valve stenosis, moderate 12/26/2012  . Groin hematoma 12/26/2012  . Aortic stenosis   . 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 HYPERLIPIDEMIA 12/04/2008  . DEPRESSION 12/04/2008  . Aortic valve disorders 12/04/2008  . ASTHMA, CHILDHOOD 12/04/2008  . ASTHMA 12/04/2008   Discharged Condition: good  History of Present Illness:   Mr. Salvia is a 72 yo white male with known history of Aortic Stenosis, Type II DM, and Hyperlipidemia.  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  The tumor has been stable in size and is followed by [facility].  He has been routinely followed over the past several years and his Aortic stenosis has been slowly progressing.  The patient reports complaints of exertional dyspnea and fatigue which have been slowly progressing.  Most recent Echocardiogram shows a mean gradient of 39 and an Aortic  Valve Area of 0.7 cm2.  Further workup with CTA Chest to assess the size of aortic root and ascending aorta, which revealed mild dilation with a maximum diameter of 3.7 cm at the level of the pulmonary artery.   The distal aortic arch and descending aorta are 2.2 cm and 1.9 cm in diameter respectively. The maximum diameter of the sinuses of Valsalva is about 4 cm which is borderline dilated.  He was originally evaluated by Dr. Laneta Simmers in May who recommended the patient undergo Aortic Valve Replacement.  The risks and benefits of the procedure were explained to the patient and he was agreeable to proceed.   Hospital Course:   Mr. Thumm presented to [facility] on 02/17/2013.  He was taken to the operating room and underwent Aortic Valve Replacement utilizing a 23 mm [facility] Ease Pericardial Tissue valve.  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  He tolerated the procedure well and was taken to the SICU in stable condition.  The patient was extubated the evening of surgery.  During his stay in the ICU the patient developed Complete Heart Block.  This was felt to be caused due to large amounts of calcium present in the valve annulus in the region of the AV node which had to be removed.  EP consult was obtained and it was felt the patient would require permanent pacemaker placement which was performed on 02/19/2013.  The patient tolerated the procedure without difficulty.  Hic chest tubes and pacing wires were removed without difficulty.  Once the patient was medically stable he was transferred to the step  down unit in stable condition.  The patient has continued to do well.  His temporary pacing wires were removed.  He is ambulating without difficulty and tolerating his diet.  Should no further issues arise we anticipate discharge on 02/22/2013.  He will follow up with Dr. Laneta Simmers in 3 weeks with a CXR prior to his appointment.  He will also need to follow up with Dr.  Gala Romney in 2-4 weeks.  Finally he will need to follow up with Dr. Graciela Husbands in 2-4 weeks.      Consults: cardiology  Significant Diagnostic Studies:   - Left ventricle: LVEF Is approximately 55 to 60% with hypokinesis of the distal inferoseptal wall. The cavity size was normal. Wall thickness was increased in a pattern of mild LVH. Doppler parameters are consistent with abnormal left ventricular relaxation (grade 1 diastolic dysfunction). - Aortic valve: AV is difficult to see. It is thickened, calcified with restricted motion. Peak and mean gradients through valve are 50 and 32 mm Hg respectively consistent with moderate AS.  Treatments: surgery:   1. Median Sternotomy 2. Extracorporeal circulation       3. Aortic valve replacement using a 23 [employer] Magna-Ease Pericardial valve.        4. Ligation of fistula between diagonal 1 and the main pulmonary artery.   Procedure: dual pacemaker implantation; contrast venogram   Disposition: 01-Home or Self Care  Discharge Medications:     Medication List    STOP taking these medications       furosemide 20 MG tablet  Commonly known as:  LASIX     potassium chloride SA 20 MEQ tablet  Commonly known as:  K-DUR,KLOR-CON      TAKE these medications       acetaminophen 500 MG tablet  Commonly known as:  TYLENOL  Take 500 mg by mouth 3 (three) times daily.     aspirin 325 MG EC tablet  Take 1 tablet (325 mg total) by mouth daily.     atorvastatin 40 MG tablet  Commonly known as:  LIPITOR  Take 40 mg by mouth daily.     bifidobacterium infantis capsule  Take 1 capsule by mouth daily.     busPIRone 15 MG tablet  Commonly known as:  BUSPAR  Take 15-30 mg by mouth 2 (two) times daily. One tablet every morning and two tablets every day at noon     Cholestyramine Powd  Take 1 scoop by mouth 2 (two) times daily.     desloratadine 5 MG tablet  Commonly known as:  CLARINEX  Take 5 mg by mouth daily.     fenofibrate 160 MG  tablet  Take 160 mg by mouth daily.     FLONASE 50 MCG/ACT nasal spray  Generic drug:  fluticasone  Place 2 sprays into the nose daily.     FORA V12 BLOOD GLUCOSE TEST test strip  Generic drug:  glucose blood     gabapentin 300 MG capsule  Commonly known as:  NEURONTIN  Take 300 mg by mouth 2 (two) times daily. One capsule in the morning and 2 capsules at noon     [facility] LANCETS Misc     multivitamin tablet  Take 1 tablet by mouth daily.     oxyCODONE 5 MG immediate release tablet  Commonly known as:  Oxy IR/ROXICODONE  Take 1-2 tablets (5-10 mg total) by mouth every 3 (three) hours as needed for pain.     PROCTOZONE-HC 2.5 % rectal cream  Generic drug:  hydrocortisone  Place 1 application rectally 2 (two) times daily.     RESTASIS 0.05 % ophthalmic emulsion  Generic drug:  cycloSPORINE  Place 1 drop into both eyes 2 (two) times daily.     sertraline 100 MG tablet  Commonly known as:  ZOLOFT  Take 100 mg by mouth daily.     SYSTANE OP  Place 1 drop into both eyes 2 (two) times daily.     traZODone 50 MG tablet  Commonly known as:  DESYREL  Take 150 mg by mouth at bedtime.         The patient has been discharged on:   1.Beta Blocker:  Yes [   ]                              No   [ x  ]                              If No, reason: patient with CHB  2.Ace Inhibitor/ARB: Yes [   ]                                     No  [ x   ]                                     If No, reason:  3.Statin:   Yes [  x ]                  No  [   ]                  If No, reason:  4.Ecasa:  Yes  [  x ]                  No   [   ]                  If No, reason:     Discharge Orders   Future Appointments Provider Department Dept Phone   03/03/2013 10:00 AM [facility] Main [facility] [place]) [phone]   Future Orders Complete By Expires     Amb Referral to Cardiac Rehabilitation  As directed       Signed: Lowella Dandy 02/21/2013, 9:31  AM

## 2013-02-21 NOTE — Progress Notes (Signed)
Subjective:   POD #4. Walking halls without difficulty. He is s/p PPM. No complaints.      Intake/Output Summary (Last 24 hours) at 02/21/13 1327 Last data filed at 02/21/13 1000  Gross per 24 hour  Intake    360 ml  Output    200 ml  Net    160 ml    Current meds: . aspirin EC  325 mg Oral Daily  . atorvastatin  40 mg Oral Daily  . busPIRone  15 mg Oral Q breakfast  . busPIRone  30 mg Oral QPC lunch  . cholestyramine light  4 g Oral BID  . cycloSPORINE  1 drop Both Eyes BID  . enoxaparin (LOVENOX) injection  40 mg Subcutaneous QHS  . famotidine  20 mg Oral BID  . fenofibrate  160 mg Oral Daily  . fluticasone  2 spray Each Nare Daily  . furosemide  40 mg Oral Daily  . gabapentin  300 mg Oral Q breakfast  . gabapentin  600 mg Oral QPC lunch  . potassium chloride  20 mEq Oral BID  . sertraline  100 mg Oral Daily  . sodium chloride  3 mL Intravenous Q12H  . traZODone  150 mg Oral QHS   Infusions:     Objective:  Blood pressure 115/49, pulse 88, temperature 98.3 F (36.8 C), temperature source Oral, resp. rate 18, height 5\' 4"  (1.626 m), weight 59.8 kg (131 lb 13.4 oz), SpO2 96.00%. Weight change: -1.5 kg (-3 lb 4.9 oz)  Physical Exam: General:  Well appearing. No resp difficulty HEENT: normal Neck: supple. JVP mildly elevated. Carotids 2+ bilat; no bruits. No lymphadenopathy or thryomegaly appreciated. Cor: PMI nondisplaced. Regular rate & rhythm.  Incision looks good. Lungs: clear decreased at basees.  Abdomen: soft, nontender, nondistended. No hepatosplenomegaly. No bruits or masses. Good bowel sounds. Extremities: no cyanosis, clubbing, rash, tr edema Neuro: alert & orientedx3, cranial nerves grossly intact. moves all 4 extremities w/o difficulty. Affect pleasant  Telemetry:  A-sensed v-paced. No AV conduction under pacer.  Lab Results: Basic Metabolic Panel:  Recent Labs Lab 02/17/13 1250  02/17/13 1817 02/17/13 1825 02/18/13 0445 02/18/13 1700  02/18/13 1710 02/19/13 0400  NA 143  --  146*  --  142  --  140 135  K 4.2  --  4.1  --  3.5  --  4.1 3.9  CL  --   --  113*  --  110  --  104 104  CO2  --   --   --   --  22  --   --  26  GLUCOSE 82  --  149*  --  104*  --  143* 160*  BUN  --   --  19  --  18  --  16 19  CREATININE  --   < > 0.90 1.09 0.95 0.93 0.90 0.97  CALCIUM  --   --   --   --  7.6*  --   --  7.7*  MG  --   --   --  3.4* 2.4 2.2  --   --   < > = values in this interval not displayed. Liver Function Tests: No results found for this basename: AST, ALT, ALKPHOS, BILITOT, PROT, ALBUMIN,  in the last 168 hours No results found for this basename: LIPASE, AMYLASE,  in the last 168 hours No results found for this basename: AMMONIA,  in the last 168 hours CBC:  Recent Labs  Lab 02/17/13 1308  02/17/13 1825 02/18/13 0445 02/18/13 1700 02/18/13 1710 02/19/13 0400  WBC 3.9*  --  10.8* 7.6 6.4  --  6.8  HGB 9.9*  < > 10.6* 9.2* 8.7* 8.5* 8.4*  HCT 28.8*  < > 31.2* 27.1* 26.3* 25.0* 24.7*  MCV 95.4  --  96.3 95.8 97.0  --  96.9  PLT 108*  --  173 112* 99*  --  95*  < > = values in this interval not displayed. Cardiac Enzymes: No results found for this basename: CKTOTAL, CKMB, CKMBINDEX, TROPONINI,  in the last 168 hours BNP: No components found with this basename: POCBNP,  CBG:  Recent Labs Lab 02/18/13 1937 02/18/13 2329 02/19/13 0400 02/19/13 0737 02/19/13 1203  GLUCAP 130* 128* 143* 122* 143*   Microbiology: No results found for this basename: cult   No results found for this basename: CULT, SDES,  in the last 168 hours  Imaging: Dg Chest Portable 1 View  02/19/2013   *RADIOLOGY REPORT*  Clinical Data: 72 year old male with cardiac pacemaker implant.  PORTABLE CHEST - 1 VIEW  Comparison: 0619 hours the same day and earlier.  Findings: Semi upright AP portable view 1919 hours.  New left chest dual lead subclavian vein approach cardiac pacemaker.  No pneumothorax identified.  Leads terminate in the  right atrium and right ventricle region.  Left pleural effusion less apparent. Small right effusion again suspected. Decreased vascular congestion and marrow overt pulmonary edema.  No other confluent pulmonary opacity.  Incidental azygos fissure.  Right IJ central line removed.  Sequelae of cardiac valve replacement.  Stable cardiac size and mediastinal contours.  Epicardial pacer wires remain.  IMPRESSION: 1.  Left chest cardiac pacemaker placed. Right IJ introducer sheath removed.  No pneumothorax or adverse features identified. 2.  Stable or decreased small pleural effusions.  No new cardiopulmonary abnormality.   Original Report Authenticated By: Erskine Speed, M.D.     ASSESSMENT:  1. AS s/p AVR (bioprosthetic) POD#2 2. CHB s/p PPM 3. Baseline LBBB 4. 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 No evidence carcinoid syndrome. He is s/p PPM. No new recs at this time. Appreciate EP and TCTS care.    LOS: 4 days    Arvilla Meres, MD 02/21/2013, 1:27 PM

## 2013-02-21 NOTE — Plan of Care (Signed)
Problem: Food- and Nutrition-Related Knowledge Deficit (NB-1.1) Goal: Nutrition education Formal process to instruct or train a patient/client in a skill or to impart knowledge to help patients/clients voluntarily manage or modify food choices and eating behavior to maintain or improve health. Outcome: Completed/Met Date Met:  02/21/13  RD consult request for nutrition education regarding a Heart Healthy/Diabetic diet.   RD provided "Heart Healthy Nutrition Therapy" & "Carbhydrate Counting for People with Diabetes" handouts from the Academy of Nutrition and Dietetics.  Reviewed patient's dietary recall.  Provided examples on ways to decrease sodium and fat intake in diet.  Discouraged intake of processed foods and use of salt shaker.  Encouraged carbohydrate portion control, diet/sugar-free beverages, fresh fruits and vegetables as well as whole grain sources of carbohydrates to maximize fiber intake.  Expect good compliance.  Body mass index is 22.62 kg/(m^2). Pt meets criteria for Normal based on current BMI.  Current diet order is Carbohydrate Modified Medium Calorie, patient is consuming approximately 100% of meals at this time.  Labs and medications reviewed.  No further nutrition interventions warranted at this time.  If additional nutrition issues arise, please re-consult RD.  RD added 2 gm Na+ restriction to patient's diet order per wife request.  Maureen Chatters, RD, LDN Pager #: [phone] After-Hours Pager #: [phone]

## 2013-02-21 NOTE — Progress Notes (Signed)
CARDIAC REHAB PHASE I   PRE:  Rate/Rhythm: 97 pacing    BP: sitting 100/60    SaO2: 97 RA  MODE:  Ambulation: 890 ft   POST:  Rate/Rhythm: 103 pacing, irregular    BP: sitting 144/70     SaO2: 100 RA  Tolerated very well. Stated that he does not need RW for home so therefore walked most of walk without using RW. Fairly steady. Did tend to lean posteriorly upon standing but able to correct. To recliner. Encouraged x2-3 more walks today and IS. [phone]   Elissa Lovett Hatton CES, ACSM 02/21/2013 9:24 AM

## 2013-02-21 NOTE — Progress Notes (Signed)
Pt ambulated 550 feet with NT; steady gait noted; pt assisted back to room to chair; will cont. To monitor.

## 2013-02-21 NOTE — Progress Notes (Addendum)
      [address]       [name] [address]             [phone]   2 Days Post-Op Procedure(s) (LRB): PERMANENT PACEMAKER INSERTION (N/A)  Subjective:  Mr. Dalzell has no new complaints this morning.  He states he is feeling pretty good.  Does have some tenderness around pacemaker site.   Objective: Vital signs in last 24 hours: Temp:  [97.5 F (36.4 C)-100.6 F (38.1 C)] 98.3 F (36.8 C) (06/27 0418) Pulse Rate:  [88-109] 88 (06/27 0418) Cardiac Rhythm:  [-] Ventricular paced (06/26 2100) Resp:  [18-22] 18 (06/27 0418) BP: (97-133)/(44-73) 97/62 mmHg (06/27 0418) SpO2:  [93 %-98 %] 96 % (06/27 0418) Weight:  [131 lb 13.4 oz (59.8 kg)] 131 lb 13.4 oz (59.8 kg) (06/27 0418)  Intake/Output from previous day: 06/26 0701 - 06/27 0700 In: 480 [P.O.:480] Out: 1300 [Urine:1300]  General appearance: alert, cooperative and no distress Heart: regular rate and rhythm and paced Lungs: clear to auscultation bilaterally Abdomen: soft, non-tender; bowel sounds normal; no masses,  no organomegaly Extremities: edema trace Wound: clean and dry  Lab Results:  Recent Labs  02/18/13 1700 02/18/13 1710 02/19/13 0400  WBC 6.4  --  6.8  HGB 8.7* 8.5* 8.4*  HCT 26.3* 25.0* 24.7*  PLT 99*  --  95*   BMET:  Recent Labs  02/18/13 1710 02/19/13 0400  NA 140 135  K 4.1 3.9  CL 104 104  CO2  --  26  GLUCOSE 143* 160*  BUN 16 19  CREATININE 0.90 0.97  CALCIUM  --  7.7*    PT/INR: No results found for this basename: LABPROT, INR,  in the last 72 hours ABG    Component Value Date/Time   PHART 7.242* 02/17/2013 1821   HCO3 23.4 02/17/2013 1821   TCO2 24 02/18/2013 1710   ACIDBASEDEF 4.0* 02/17/2013 1821   O2SAT 99.0 02/17/2013 1821   CBG (last 3)   Recent Labs  02/19/13 0400 02/19/13 0737 02/19/13 1203  GLUCAP 143* 122* 143*    Assessment/Plan: S/P Procedure(s) (LRB): PERMANENT PACEMAKER INSERTION (N/A)  1. CV- NSR, paced 2. Pulm- no acute issues,  continue IS 3. GI- incontinent of stool- chronic issue, daughter questioning need for GI consult, I explained that they should follow up with GI doctor at discharge 4. Dispo- patient stable, doing very well, will d/c EPW this morning, will plan for d/c in AM pending no issues arise   LOS: 4 days    Lowella Dandy 02/21/2013   Chart reviewed, patient examined, agree with above.

## 2013-02-21 NOTE — Progress Notes (Signed)
Pt ambulated 550 feet with RN; no walker needed; steady gait; pt back to room to chair; call bell w/i reach; will cont. To monitor.

## 2013-02-22 MED ORDER — OXYCODONE HCL 5 MG PO TABS: 5.0000 mg | ORAL_TABLET | ORAL | Status: DC | PRN

## 2013-02-22 MED ORDER — ASPIRIN 325 MG PO TBEC: 325.0000 mg | DELAYED_RELEASE_TABLET | Freq: Every day | ORAL | Status: DC

## 2013-02-22 NOTE — Progress Notes (Signed)
       [address]       [name] [address]             [phone]          3 Days Post-Op Procedure(s) (LRB): PERMANENT PACEMAKER INSERTION (N/A)  Subjective: Feels well, no complaints.   Objective: Vital signs in last 24 hours: Patient Vitals for the past 24 hrs:  BP Temp Temp src Pulse Resp SpO2 Weight  02/22/13 0540 - - - - - - 130 lb 4.8 oz (59.104 kg)  02/22/13 0420 125/63 mmHg 98.3 F (36.8 C) Oral 96 16 93 % -  02/21/13 2032 104/61 mmHg 99.5 F (37.5 C) Oral 103 18 96 % -  02/21/13 1346 99/46 mmHg 98.7 F (37.1 C) Oral 99 16 97 % -  02/21/13 1212 115/49 mmHg - - 88 - - -  02/21/13 1200 93/49 mmHg - - 89 - - -  02/21/13 1145 98/53 mmHg - - 88 - - -  02/21/13 1130 113/49 mmHg - - 88 - - -  02/21/13 1118 104/51 mmHg - - 90 - - -  02/21/13 1059 99/49 mmHg - - 92 - - -  02/21/13 1051 110/53 mmHg - - 94 - - -   Current Weight  02/22/13 130 lb 4.8 oz (59.104 kg)     Intake/Output from previous day: 06/27 0701 - 06/28 0700 In: 960 [P.O.:960] Out: 400 [Urine:400]    PHYSICAL EXAM:  Heart: RRR Lungs: Clear Wound: Clean and dry Extremities: No edema   Lab Results: CBC:No results found for this basename: WBC, HGB, HCT, PLT,  in the last 72 hours BMET: No results found for this basename: NA, K, CL, CO2, GLUCOSE, BUN, CREATININE, CALCIUM,  in the last 72 hours  PT/INR: No results found for this basename: LABPROT, INR,  in the last 72 hours    Assessment/Plan: S/P Procedure(s) (LRB): PERMANENT PACEMAKER INSERTION (N/A) Stable, progressing well. Plan d/c home today- instructions reviewed with patient and wife.   LOS: 5 days    Makalyn Lennox H 02/22/2013

## 2013-02-22 NOTE — Progress Notes (Signed)
Pt amb 578ft with NT. Steady gait noted. Pt did not have any complaints. Will continue to monitor pt closely.

## 2013-02-27 ENCOUNTER — Ambulatory Visit (HOSPITAL_COMMUNITY): Payer: Medicare Other

## 2013-02-27 ENCOUNTER — Other Ambulatory Visit (HOSPITAL_COMMUNITY): Payer: Medicare Other

## 2013-02-27 ENCOUNTER — Encounter (HOSPITAL_COMMUNITY): Payer: Medicare Other

## 2013-03-03 ENCOUNTER — Encounter: Payer: Self-pay | Admitting: Internal Medicine

## 2013-03-03 ENCOUNTER — Ambulatory Visit (INDEPENDENT_AMBULATORY_CARE_PROVIDER_SITE_OTHER): Payer: Medicare Other | Admitting: *Deleted

## 2013-03-03 DIAGNOSIS — I442 Atrioventricular block, complete: Secondary | ICD-10-CM | POA: Diagnosis not present

## 2013-03-03 LAB — PACEMAKER DEVICE OBSERVATION
AL THRESHOLD: 0.75 V
ATRIAL PACING PM: 0
BATTERY VOLTAGE: 2.8 V
RV LEAD THRESHOLD: 1 V

## 2013-03-03 NOTE — Progress Notes (Signed)
Wound check-PPM in office. 

## 2013-03-10 ENCOUNTER — Other Ambulatory Visit: Payer: Self-pay | Admitting: *Deleted

## 2013-03-10 DIAGNOSIS — I350 Nonrheumatic aortic (valve) stenosis: Secondary | ICD-10-CM

## 2013-03-12 ENCOUNTER — Ambulatory Visit (INDEPENDENT_AMBULATORY_CARE_PROVIDER_SITE_OTHER): Payer: Self-pay | Admitting: Surgery

## 2013-03-12 ENCOUNTER — Ambulatory Visit
Admission: RE | Admit: 2013-03-12 | Discharge: 2013-03-12 | Disposition: A | Discharge status: Home / Self Care | Payer: Medicare Other | Source: Ambulatory Visit | Attending: Surgery | Admitting: Surgery

## 2013-03-12 ENCOUNTER — Encounter: Payer: Self-pay | Admitting: Surgery

## 2013-03-12 VITALS — BP 117/68 | HR 76 | Resp 18 | Ht 64.0 in | Wt 130.0 lb

## 2013-03-12 DIAGNOSIS — Z952 Presence of prosthetic heart valve: Secondary | ICD-10-CM

## 2013-03-12 DIAGNOSIS — I350 Nonrheumatic aortic (valve) stenosis: Secondary | ICD-10-CM

## 2013-03-12 DIAGNOSIS — Z954 Presence of other heart-valve replacement: Secondary | ICD-10-CM

## 2013-03-12 DIAGNOSIS — J9819 Other pulmonary collapse: Secondary | ICD-10-CM | POA: Diagnosis not present

## 2013-03-12 DIAGNOSIS — I359 Nonrheumatic aortic valve disorder, unspecified: Secondary | ICD-10-CM

## 2013-03-12 DIAGNOSIS — J90 Pleural effusion, not elsewhere classified: Secondary | ICD-10-CM | POA: Diagnosis not present

## 2013-03-12 NOTE — Progress Notes (Signed)
[address]       [name] [address]             [phone]       HPI: Patient returns for routine postoperative follow-up having undergone aortic valve replacement and ligation of a fistula between the diagonal and main PA on 02/17/2013. The patient's early postoperative recovery while in the hospital was notable for an uncomplicated postop course. Since hospital discharge the patient reports that he has continued to get stronger. He is walking for 30 minutes twice per day.   Current Outpatient Prescriptions  Medication Sig Dispense Refill  . acetaminophen (TYLENOL) 500 MG tablet Take 500 mg by mouth 3 (three) times daily.       Marland Kitchen aspirin EC 325 MG EC tablet Take 1 tablet (325 mg total) by mouth daily.  30 tablet  0  . atorvastatin (LIPITOR) 40 MG tablet Take 40 mg by mouth daily.      . bifidobacterium infantis (ALIGN) capsule Take 1 capsule by mouth daily.      . busPIRone (BUSPAR) 15 MG tablet Take 15-30 mg by mouth 2 (two) times daily. One tablet every morning and two tablets every day at noon      . Cholestyramine POWD Take 1 scoop by mouth 2 (two) times daily.       . cycloSPORINE (RESTASIS) 0.05 % ophthalmic emulsion Place 1 drop into both eyes 2 (two) times daily.       Marland Kitchen desloratadine (CLARINEX) 5 MG tablet Take 5 mg by mouth daily.       . fenofibrate 160 MG tablet Take 160 mg by mouth daily.       . fluticasone (FLONASE) 50 MCG/ACT nasal spray Place 2 sprays into the nose daily.       Marland Kitchen FORA V12 BLOOD GLUCOSE TEST test strip       . gabapentin (NEURONTIN) 300 MG capsule Take 300 mg by mouth 2 (two) times daily. One capsule in the morning and 2 capsules at noon      . LITETOUCH LANCETS MISC       . Multiple Vitamin (MULTIVITAMIN) tablet Take 1 tablet by mouth daily.       Bertram Gala Glycol-Propyl Glycol (SYSTANE OP) Place 1 drop into both eyes 2 (two) times daily.      Marland Kitchen PROCTOZONE-HC 2.5 % rectal cream Place 1 application rectally 2 (two) times  daily.       . sertraline (ZOLOFT) 100 MG tablet Take 100 mg by mouth daily.       . traZODone (DESYREL) 50 MG tablet Take 150 mg by mouth at bedtime.        No current facility-administered medications for this visit.    Physical Exam: BP 117/68  Pulse 76  Resp 18  Ht 5\' 4"  (1.626 m)  Wt 130 lb (58.968 kg)  BMI 22.3 kg/m2  SpO2 97% He looks well Lungs are clear Heart exam shows a regular rate and rhythm with normal prosthetic valve sounds. The chest incision is healing well and the sternum is stable.  Diagnostic Tests:  *RADIOLOGY REPORT*  Clinical Data: Aortic stenosis. Valve replacement 3-4 weeks ago.  CHEST - 2 VIEW  Comparison: 02/19/2013.  Findings: Cardiopericardial silhouette is within normal limits.  Dual lead left subclavian cardiac pacemaker. Bioprosthetic aortic  valve. Small bilateral pleural effusions remain present, slightly  greater on the right than left. Subsegmental atelectasis or  scarring at the bases.  There  is no pulmonary edema. No airspace disease or pleural  effusion. Azygos fissure incidentally noted. Aortic arch  atherosclerosis. On the lateral view, there appears to be  hyperinflation compatible with emphysema.  IMPRESSION:  Normalizing chest radiograph with decreasing bilateral pleural  effusions and basilar atelectasis. Effusions are now small.  Postprocedural and postoperative changes with bioprosthetic aortic  valve.  Original Report Authenticated By: Andreas Newport, M.D.    Impression:  He is doing well following AVR. I told him he could return to driving a car but should not lift anything heavier than 10 lbs for three months.  Plan:  He will continue to follow-up with Dr. Gala Romney and will contact me if he has any problems with his incision.

## 2013-03-13 ENCOUNTER — Ambulatory Visit (HOSPITAL_COMMUNITY)
Admission: RE | Admit: 2013-03-13 | Discharge: 2013-03-13 | Disposition: A | Discharge status: Home / Self Care | Payer: Medicare Other | Source: Ambulatory Visit | Attending: Cardiology | Admitting: Cardiology

## 2013-03-13 ENCOUNTER — Encounter (HOSPITAL_COMMUNITY): Payer: Self-pay

## 2013-03-13 VITALS — BP 110/62 | HR 78 | Wt 123.0 lb

## 2013-03-13 DIAGNOSIS — I359 Nonrheumatic aortic valve disorder, unspecified: Secondary | ICD-10-CM | POA: Insufficient documentation

## 2013-03-13 DIAGNOSIS — I442 Atrioventricular block, complete: Secondary | ICD-10-CM | POA: Diagnosis not present

## 2013-03-13 NOTE — Patient Instructions (Addendum)
Follow up in 3 months.  Call if you notice any increase in SOB or weight gain.  Doing great.

## 2013-03-13 NOTE — Progress Notes (Signed)
Patient ID: [name], male   DOB: [birth year], 72 y.o.   MRN: [id number] HPI:  [name] is 72 y/o male with h/o moderate-severe AS s/p AVR (01/2013), DM2, CHB (s/p pacemaker) and hyperlipidemia.    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 Lattie Corns.     Echo in March 2012 showed stable aortic stensosis in the moderate range (mean 25mm HG was 23 in 3/11) with normal LV function.+ pseudonormal filling  Echo 08/2011 EF normal AS mean gradient up to 35.   12/16/12 Echo: EF normal but distal septum hypokinetic.  AS mean gradient up to 39. AVA 0.7  RHC/LHC 12/25/12  RA = 4  RV = 31/3/7  PA = 31/8 (18)  PCW = 9  Fick cardiac output/index = 4.2/2.7  PVR = 1.0 Woods  FA sat = 95%  PA sat = 68%, 73%  Ao Pressure: 135/58 (89)  LV Pressure: 166/8/16  Simultaneous LV-AO gradient: 31 mm HG (peak to peak) (mean)  AVA 0.78cm2 1. Normal coronary arteries except for a fistulous connection between the first diagonal and what appears to be the pulmonary vasculature  2. Normal intracardiac pressures  3. Moderate to severe aortic stenosis   01/28/13 Potassium 4.0 Creatinine 1.0   Follow up: Bioprosthetic AVR with ligation of diagonal-PA fistula by Dr. Laneta Simmers on 02/17/13. He had post-operative complete heart block. A Medtronic dual chamber pacemaker was placed 02/19/13. Reports doing great and getting his strength back. Denies SOB, orthopnea, dizziness or syncope. Has been cleared by Dr. Laneta Simmers to drive. Some minimal SOB with heavier exertion.   Echo post-AVR in 6/14 showed EF 55-60%, normal bioprosthetic aortic valve.   ECG: NSR with V-pacing  ROS: All systems negative except as listed in HPI, PMH and Problem List.  Past Medical History  Diagnosis Date  . Aortic stenosis     moderate. Echo 3/10: EF 55-60%. mean gradient 25 mmHg. AVA 1.23cm2. Echo 3/11: Normal EF. mean AVA  gradient . exercise myoview 2008: positive ECG. normal perfusion.  Bioprosthetic AVR with ligation of diagonal-PA fistula  . Exposure to secondhand smoke     extensive exposure  . Hyperlipidemia     treated  . Depression   . Asthma   . Heart murmur   . LBBB (left bundle branch block)   . Shortness of breath     exertion  . DM2 (diabetes mellitus, type 2)     fasting blood sugar - 90s  . Dizziness   . Testicular cancer     s/p XRT and surgical resection.  . Chronic lymphocytic leukemia     followed by Dr. Candyce Churn  - CHB post-op AoV surgery.  Medtronic dual chamber PCM.   Current Outpatient Prescriptions  Medication Sig Dispense Refill  . acetaminophen (TYLENOL) 500 MG tablet Take 500 mg by mouth 3 (three) times daily.       Marland Kitchen aspirin EC 325 MG EC tablet Take 1 tablet (325 mg total) by mouth daily.  30 tablet  0  . atorvastatin (LIPITOR) 40 MG tablet Take 40 mg by mouth daily.      . bifidobacterium infantis (ALIGN) capsule Take 1 capsule by mouth daily.      . busPIRone (BUSPAR) 15 MG tablet Take 15-30 mg by mouth 2 (two) times daily. One tablet every morning and two tablets every day at noon      . Cholestyramine POWD  Take 1 scoop by mouth 2 (two) times daily.       . cycloSPORINE (RESTASIS) 0.05 % ophthalmic emulsion Place 1 drop into both eyes 2 (two) times daily.       Marland Kitchen desloratadine (CLARINEX) 5 MG tablet Take 5 mg by mouth daily.       . fenofibrate 160 MG tablet Take 160 mg by mouth daily.       . fluticasone (FLONASE) 50 MCG/ACT nasal spray Place 2 sprays into the nose daily.       Marland Kitchen FORA V12 BLOOD GLUCOSE TEST test strip       . gabapentin (NEURONTIN) 300 MG capsule Take 300 mg by mouth 2 (two) times daily. One capsule in the morning and 2 capsules at noon      . LITETOUCH LANCETS MISC       . Multiple Vitamin (MULTIVITAMIN) tablet Take 1 tablet by mouth daily.       Bertram Gala Glycol-Propyl Glycol (SYSTANE OP) Place 1 drop into both eyes 2 (two) times daily.       Marland Kitchen PROCTOZONE-HC 2.5 % rectal cream Place 1 application rectally 2 (two) times daily.       . sertraline (ZOLOFT) 100 MG tablet Take 100 mg by mouth daily.       . traZODone (DESYREL) 50 MG tablet Take 150 mg by mouth at bedtime.        No current facility-administered medications for this encounter.     PHYSICAL EXAM: Filed Vitals:   03/13/13 1021  BP: 110/62  Pulse: 78  Weight: 123 lb (55.792 kg)  SpO2: 100%    General:  well appearing. no resp difficulty; wife present HEENT: normal Neck: supple. JVP 7-8. No carotid bruit.  Cor: PMI nondisplaced. Regular rate & rhythm. 1/6 SEM RUSB.  Lungs: clear Abdomen: soft, nontender, nondistended. No hepatosplenomegaly. No bruits or masses. Good bowel sounds. Extremities: no cyanosis, clubbing, rash, no edema Neuro: alert & orientedx3, cranial nerves grossly intact. moves all 4 extremities w/o difficulty. affect pleasant  ASSESSMENT & PLAN: 1. Status post AVR: Doing well, needs to start cardiac rehab.  Had post-op baseline echo in 6/14.  Well-seated valve.  2. Complete heart block: Medtronic PCM.  He is V-paced today.  3. Diagonal-PA fistula: Ligated at time of AVR.   Marca Ancona 03/14/2013

## 2013-03-14 NOTE — Addendum Note (Signed)
Encounter addended by: Simon Rhein, CCT on: 03/14/2013  7:39 AM<BR>     Documentation filed: Charges VN

## 2013-03-20 ENCOUNTER — Encounter (HOSPITAL_COMMUNITY)
Admission: RE | Admit: 2013-03-20 | Discharge: 2013-03-20 | Disposition: A | Discharge status: Home / Self Care | Payer: Medicare Other | Source: Ambulatory Visit | Attending: Internal Medicine | Admitting: Internal Medicine

## 2013-03-20 DIAGNOSIS — N400 Enlarged prostate without lower urinary tract symptoms: Secondary | ICD-10-CM | POA: Diagnosis not present

## 2013-03-20 DIAGNOSIS — R972 Elevated prostate specific antigen [PSA]: Secondary | ICD-10-CM | POA: Diagnosis not present

## 2013-03-20 DIAGNOSIS — I5032 Chronic diastolic (congestive) heart failure: Secondary | ICD-10-CM | POA: Insufficient documentation

## 2013-03-20 DIAGNOSIS — C9110 Chronic lymphocytic leukemia of B-cell type not having achieved remission: Secondary | ICD-10-CM | POA: Insufficient documentation

## 2013-03-20 DIAGNOSIS — Z5189 Encounter for other specified aftercare: Secondary | ICD-10-CM | POA: Insufficient documentation

## 2013-03-20 NOTE — Progress Notes (Signed)
Cardiac Rehab Medication Review by a Pharmacist  Does the patient  feel that his/her medications are working for him/her?  yes  Has the patient been experiencing any side effects to the medications prescribed?  no  Does the patient measure his/her own blood pressure or blood glucose at home?  yes   Does the patient have any problems obtaining medications due to transportation or finances?   no  Understanding of regimen: good Understanding of indications: excellent Potential of compliance: excellent    Pharmacist comments: 72 yo male, pleasant, brought bag of medications with him today. Says normally he organizes them in his pill box, very adherent to medications. Per patient report, has not missed any medications except maybe skipped use of cholestyramine. Takes own blood sugar at home.  Tyrone Nine. Artelia Laroche, PharmD Clinical Pharmacist - Resident Pager: [phone] Phone: [phone] 03/20/2013 9:07 AM

## 2013-03-24 ENCOUNTER — Encounter (HOSPITAL_COMMUNITY)
Admission: RE | Admit: 2013-03-24 | Discharge: 2013-03-24 | Disposition: A | Discharge status: Home / Self Care | Payer: Medicare Other | Source: Ambulatory Visit | Attending: Internal Medicine | Admitting: Internal Medicine

## 2013-03-24 DIAGNOSIS — C9110 Chronic lymphocytic leukemia of B-cell type not having achieved remission: Secondary | ICD-10-CM | POA: Diagnosis not present

## 2013-03-24 DIAGNOSIS — Z5189 Encounter for other specified aftercare: Secondary | ICD-10-CM | POA: Diagnosis not present

## 2013-03-24 DIAGNOSIS — I5032 Chronic diastolic (congestive) heart failure: Secondary | ICD-10-CM | POA: Diagnosis not present

## 2013-03-24 LAB — GLUCOSE, CAPILLARY
Glucose-Capillary: 82 mg/dL (ref 70–99)
Glucose-Capillary: 107 mg/dL — ABNORMAL HIGH (ref 70–99)

## 2013-03-24 NOTE — Progress Notes (Signed)
Patient tolerated first day of exercise fine. Oriented to routine and reviewed department safety. Reinforced importance to report any unusual symptoms to staff and report any medication changes.  ECG tracing  V-paced with occasional PACs. Blood glucose currently controlled through diet and exercise. CBG checks today pre-exercise 82 post-exercise 107. Will continue to support and encourage.

## 2013-03-26 ENCOUNTER — Encounter (HOSPITAL_COMMUNITY)
Admission: RE | Admit: 2013-03-26 | Discharge: 2013-03-26 | Disposition: A | Discharge status: Home / Self Care | Payer: Medicare Other | Source: Ambulatory Visit | Attending: Internal Medicine | Admitting: Internal Medicine

## 2013-03-28 ENCOUNTER — Encounter (HOSPITAL_COMMUNITY)
Admission: RE | Admit: 2013-03-28 | Discharge: 2013-03-28 | Disposition: A | Discharge status: Home / Self Care | Payer: Medicare Other | Source: Ambulatory Visit | Attending: Internal Medicine | Admitting: Internal Medicine

## 2013-03-28 DIAGNOSIS — Z5189 Encounter for other specified aftercare: Secondary | ICD-10-CM | POA: Insufficient documentation

## 2013-03-28 DIAGNOSIS — C9110 Chronic lymphocytic leukemia of B-cell type not having achieved remission: Secondary | ICD-10-CM | POA: Diagnosis not present

## 2013-03-28 DIAGNOSIS — I5032 Chronic diastolic (congestive) heart failure: Secondary | ICD-10-CM | POA: Diagnosis not present

## 2013-03-31 ENCOUNTER — Encounter (HOSPITAL_COMMUNITY)
Admission: RE | Admit: 2013-03-31 | Discharge: 2013-03-31 | Disposition: A | Discharge status: Home / Self Care | Payer: Medicare Other | Source: Ambulatory Visit | Attending: Internal Medicine | Admitting: Internal Medicine

## 2013-04-02 ENCOUNTER — Encounter (HOSPITAL_COMMUNITY)
Admission: RE | Admit: 2013-04-02 | Discharge: 2013-04-02 | Disposition: A | Discharge status: Home / Self Care | Payer: Medicare Other | Source: Ambulatory Visit | Attending: Internal Medicine | Admitting: Internal Medicine

## 2013-04-02 ENCOUNTER — Other Ambulatory Visit: Payer: Self-pay

## 2013-04-02 NOTE — Progress Notes (Signed)
[name] 72 y.o. male Nutrition Note Spoke with pt. Nutrition Plan and Nutrition Survey goals reviewed with pt. Pt is following Step 2 of the Therapeutic Lifestyle Changes diet. Pt wants to gain wt. Per pt, "I want to gain about 4 lbs." Pt is drinking 1 Ensure Plus daily to help promote weight gain. Wt gain tips reviewed.  Pt is diabetic. Last A1c indicates blood glucose well-controlled. Pt checks his fasting CBG's daily despite being a "diet-controlled diabetic." Frequency of glucometer checks discussed. This Clinical research associate went over Diabetes Education test results. Pt expressed understanding of the information reviewed. Pt aware of nutrition education classes offered and has attended several nutrition classes.  Nutrition Diagnosis   Food-and nutrition-related knowledge deficit related to lack of exposure to information as related to diagnosis of: ? CVD ? DM (A1c 5.9)   Underweight  related to excessive energy expenditure as evidenced by reported recent wt loss and low BMI of 20.9  Nutrition RX/ Estimated Daily Nutrition Needs for: wt gain  2300-2800 Kcal, 60-75 gm fat, 15-19 gm sat fat, 2.3-2.8 gm trans-fat, <1500 mg sodium, 325 gm CHO   Nutrition Intervention   Pt's individual nutrition plan reviewed with pt.   Benefits of adopting Therapeutic Lifestyle Changes discussed when Medficts reviewed.   Pt to attend the Portion Distortion class - met 03/26/13   Pt to attend the  ? Nutrition I class - met 04/01/13                    ? Nutrition II class - met 03/25/13        ? Diabetes Blitz class       ? Diabetes Q & A class   Pt given handouts for: ? High Calorie, High Protein diet and recipes   Continue client-centered nutrition education by RD, as part of interdisciplinary care. Goal(s)   Pt to identify food quantities necessary to achieve: ? wt gain to a goal wt of 126 lb (57.3 kg) at graduation from cardiac rehab.  Monitor and Evaluate progress toward nutrition goal with team. Nutrition  Risk: Change to Moderate   Mickle Plumb, M.Ed, RD, LDN, CDE 04/02/2013 12:28 PM

## 2013-04-03 ENCOUNTER — Encounter: Payer: Self-pay | Admitting: Internal Medicine

## 2013-04-04 ENCOUNTER — Encounter (HOSPITAL_COMMUNITY)
Admission: RE | Admit: 2013-04-04 | Discharge: 2013-04-04 | Disposition: A | Discharge status: Home / Self Care | Payer: Medicare Other | Source: Ambulatory Visit | Attending: Internal Medicine | Admitting: Internal Medicine

## 2013-04-07 ENCOUNTER — Encounter (HOSPITAL_COMMUNITY)
Admission: RE | Admit: 2013-04-07 | Discharge: 2013-04-07 | Disposition: A | Discharge status: Home / Self Care | Payer: Medicare Other | Source: Ambulatory Visit | Attending: Internal Medicine | Admitting: Internal Medicine

## 2013-04-07 ENCOUNTER — Encounter: Payer: Self-pay | Admitting: Internal Medicine

## 2013-04-07 NOTE — Progress Notes (Signed)
Reviewed home exercise with pt today.  Pt plans to walk at home with dog for exercise.  Reviewed THR, pulse, RPE, sign and symptoms, and when to call 911 or MD.  Pt voiced understanding. Fabio Pierce, MA, ACSM RCEP

## 2013-04-09 ENCOUNTER — Encounter (HOSPITAL_COMMUNITY)
Admission: RE | Admit: 2013-04-09 | Discharge: 2013-04-09 | Disposition: A | Discharge status: Home / Self Care | Payer: Medicare Other | Source: Ambulatory Visit | Attending: Internal Medicine | Admitting: Internal Medicine

## 2013-04-11 ENCOUNTER — Encounter (HOSPITAL_COMMUNITY)
Admission: RE | Admit: 2013-04-11 | Discharge: 2013-04-11 | Disposition: A | Discharge status: Home / Self Care | Payer: Medicare Other | Source: Ambulatory Visit | Attending: Internal Medicine | Admitting: Internal Medicine

## 2013-04-11 NOTE — Progress Notes (Signed)
[name] complained of having intermittent episodes of heart burn today during exercise at cardiac rehab.  Blood pressures were noted in the 160's systolic today during exercise. [name] did not have any further complaints after [name] finished exercising on the treadmill.  Exit blood pressure 98/60. Dr Bensimhon's office notified of the patients complaint today. Today's ECG tracing and exercise flow sheet with vital signs faxed to Dr Bensimhon's office. Oxygen saturation 99% on Room air CBG 107.  Dr Bensimhon's office said [name] is okay to go home. Will continue to monitor the patient throughout  the program. .

## 2013-04-14 ENCOUNTER — Encounter (HOSPITAL_COMMUNITY)
Admission: RE | Admit: 2013-04-14 | Discharge: 2013-04-14 | Disposition: A | Discharge status: Home / Self Care | Payer: Medicare Other | Source: Ambulatory Visit | Attending: Internal Medicine | Admitting: Internal Medicine

## 2013-04-16 ENCOUNTER — Encounter (HOSPITAL_COMMUNITY)
Admission: RE | Admit: 2013-04-16 | Discharge: 2013-04-16 | Disposition: A | Discharge status: Home / Self Care | Payer: Medicare Other | Source: Ambulatory Visit | Attending: Internal Medicine | Admitting: Internal Medicine

## 2013-04-18 ENCOUNTER — Encounter (HOSPITAL_COMMUNITY)
Admission: RE | Admit: 2013-04-18 | Discharge: 2013-04-18 | Disposition: A | Discharge status: Home / Self Care | Payer: Medicare Other | Source: Ambulatory Visit | Attending: Internal Medicine | Admitting: Internal Medicine

## 2013-04-21 ENCOUNTER — Encounter (HOSPITAL_COMMUNITY)
Admission: RE | Admit: 2013-04-21 | Discharge: 2013-04-21 | Disposition: A | Discharge status: Home / Self Care | Payer: Medicare Other | Source: Ambulatory Visit | Attending: Internal Medicine | Admitting: Internal Medicine

## 2013-04-23 ENCOUNTER — Encounter (HOSPITAL_COMMUNITY)
Admission: RE | Admit: 2013-04-23 | Discharge: 2013-04-23 | Disposition: A | Discharge status: Home / Self Care | Payer: Medicare Other | Source: Ambulatory Visit | Attending: Internal Medicine | Admitting: Internal Medicine

## 2013-04-25 ENCOUNTER — Encounter (HOSPITAL_COMMUNITY)
Admission: RE | Admit: 2013-04-25 | Discharge: 2013-04-25 | Disposition: A | Discharge status: Home / Self Care | Payer: Medicare Other | Source: Ambulatory Visit | Attending: Internal Medicine | Admitting: Internal Medicine

## 2013-04-28 ENCOUNTER — Encounter (HOSPITAL_COMMUNITY): Payer: Medicare Other

## 2013-04-28 DIAGNOSIS — I5032 Chronic diastolic (congestive) heart failure: Secondary | ICD-10-CM | POA: Insufficient documentation

## 2013-04-28 DIAGNOSIS — C9110 Chronic lymphocytic leukemia of B-cell type not having achieved remission: Secondary | ICD-10-CM | POA: Insufficient documentation

## 2013-04-28 DIAGNOSIS — Z5189 Encounter for other specified aftercare: Secondary | ICD-10-CM | POA: Insufficient documentation

## 2013-04-30 ENCOUNTER — Encounter (HOSPITAL_COMMUNITY)
Admission: RE | Admit: 2013-04-30 | Discharge: 2013-04-30 | Disposition: A | Discharge status: Home / Self Care | Payer: Medicare Other | Source: Ambulatory Visit | Attending: Internal Medicine | Admitting: Internal Medicine

## 2013-04-30 DIAGNOSIS — I5032 Chronic diastolic (congestive) heart failure: Secondary | ICD-10-CM | POA: Diagnosis not present

## 2013-04-30 DIAGNOSIS — Z5189 Encounter for other specified aftercare: Secondary | ICD-10-CM | POA: Diagnosis not present

## 2013-04-30 DIAGNOSIS — C9110 Chronic lymphocytic leukemia of B-cell type not having achieved remission: Secondary | ICD-10-CM | POA: Diagnosis not present

## 2013-04-30 NOTE — Progress Notes (Signed)
Ron's systolic blood pressure was noted in the 170's today with exercise.  Subsequent blood pressure were improved.  Exit blood pressure 96/60 the 118/70.  Dr Bensimhon's office called and notified spoke with [facility] Dr Bensimhon's nurse.  Will fax exercise flow sheets to Dr. Prescott Gum office for review.

## 2013-05-02 ENCOUNTER — Encounter (HOSPITAL_COMMUNITY)
Admission: RE | Admit: 2013-05-02 | Discharge: 2013-05-02 | Disposition: A | Discharge status: Home / Self Care | Payer: Medicare Other | Source: Ambulatory Visit | Attending: Internal Medicine | Admitting: Internal Medicine

## 2013-05-02 ENCOUNTER — Encounter: Payer: Self-pay | Admitting: Internal Medicine

## 2013-05-05 ENCOUNTER — Encounter (HOSPITAL_COMMUNITY)
Admission: RE | Admit: 2013-05-05 | Discharge: 2013-05-05 | Disposition: A | Discharge status: Home / Self Care | Payer: Medicare Other | Source: Ambulatory Visit | Attending: Internal Medicine | Admitting: Internal Medicine

## 2013-05-07 ENCOUNTER — Encounter (HOSPITAL_COMMUNITY)
Admission: RE | Admit: 2013-05-07 | Discharge: 2013-05-07 | Disposition: A | Discharge status: Home / Self Care | Payer: Medicare Other | Source: Ambulatory Visit | Attending: Internal Medicine | Admitting: Internal Medicine

## 2013-05-08 DIAGNOSIS — H2510 Age-related nuclear cataract, unspecified eye: Secondary | ICD-10-CM | POA: Diagnosis not present

## 2013-05-08 DIAGNOSIS — E119 Type 2 diabetes mellitus without complications: Secondary | ICD-10-CM | POA: Diagnosis not present

## 2013-05-09 ENCOUNTER — Encounter (HOSPITAL_COMMUNITY): Payer: Medicare Other

## 2013-05-12 ENCOUNTER — Encounter (HOSPITAL_COMMUNITY)
Admission: RE | Admit: 2013-05-12 | Discharge: 2013-05-12 | Disposition: A | Discharge status: Home / Self Care | Payer: Medicare Other | Source: Ambulatory Visit | Attending: Internal Medicine | Admitting: Internal Medicine

## 2013-05-14 ENCOUNTER — Encounter (HOSPITAL_COMMUNITY)
Admission: RE | Admit: 2013-05-14 | Discharge: 2013-05-14 | Disposition: A | Discharge status: Home / Self Care | Payer: Medicare Other | Source: Ambulatory Visit | Attending: Internal Medicine | Admitting: Internal Medicine

## 2013-05-15 ENCOUNTER — Ambulatory Visit (HOSPITAL_COMMUNITY)
Admission: RE | Admit: 2013-05-15 | Discharge: 2013-05-15 | Disposition: A | Discharge status: Home / Self Care | Payer: Medicare Other | Source: Ambulatory Visit | Attending: Cardiology | Admitting: Cardiology

## 2013-05-15 ENCOUNTER — Encounter (HOSPITAL_COMMUNITY): Payer: Self-pay

## 2013-05-15 VITALS — BP 120/66 | HR 78 | Wt 127.8 lb

## 2013-05-15 DIAGNOSIS — Z954 Presence of other heart-valve replacement: Secondary | ICD-10-CM

## 2013-05-15 DIAGNOSIS — I442 Atrioventricular block, complete: Secondary | ICD-10-CM | POA: Insufficient documentation

## 2013-05-15 DIAGNOSIS — Z952 Presence of prosthetic heart valve: Secondary | ICD-10-CM

## 2013-05-15 DIAGNOSIS — I5032 Chronic diastolic (congestive) heart failure: Secondary | ICD-10-CM

## 2013-05-15 NOTE — Patient Instructions (Addendum)
We will contact you in 6 months to schedule your next appointment.  

## 2013-05-15 NOTE — Progress Notes (Signed)
Patient ID: [name], male   DOB: 1940-12-09, 72 y.o.   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 Lattie Corns.     Echo in March 2012 showed stable aortic stensosis in the moderate range (mean 25mm HG was 23 in 3/11) with normal LV function.+ pseudonormal filling  Echo 08/2011 EF normal AS mean gradient up to 35.   12/16/12 Echo: EF normal but distal septum hypokinetic.  AS mean gradient up to 39. AVA 0.7  RHC/LHC 12/25/12  RA = 4  RV = 31/3/7  PA = 31/8 (18)  PCW = 9  Fick cardiac output/index = 4.2/2.7  PVR = 1.0 Woods  FA sat = 95%  PA sat = 68%, 73%  Ao Pressure: 135/58 (89)  LV Pressure: 166/8/16  Simultaneous LV-AO gradient: 31 mm HG (peak to peak) (mean)  AVA 0.78cm2 1. Normal coronary arteries except for a fistulous connection between the first diagonal and what appears to be the pulmonary vasculature  2. Normal intracardiac pressures  3. Moderate to severe aortic stenosis   Bioprosthetic AVR with ligation of diagonal-PA fistula by Dr. Laneta Simmers on 02/17/13. He had post-operative complete heart block. A Medtronic dual chamber pacemaker was placed 02/19/13. Reports doing great and getting his strength back. Denies orthopnea, dizziness or syncope. He is doing cardiac rehab.  He is not dyspneic walking on flat ground but has mild dyspnea with steps.  No chest pain.  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  He does have some lower extremity edema.   Echo post-AVR in 6/14 showed EF 55-60%, normal bioprosthetic aortic valve.   Labs (6/14): Potassium 4.0 Creatinine 1.0   ROS: All systems negative except as listed in HPI, PMH and Problem List.  SH: Married, lives in [place], nonsmoker,  works at the [employer]   Past Medical History  Diagnosis Date  . Aortic stenosis     moderate. Echo 3/10: EF 55-60%. mean gradient 25 mmHg. AVA 1.23cm2. Echo 3/11: Normal EF. mean AVA gradient . exercise myoview 2008: positive ECG. normal perfusion.  Bioprosthetic AVR with ligation of diagonal-PA fistula  . Exposure to secondhand smoke     extensive exposure  . Hyperlipidemia     treated  . Depression   . Asthma   . Heart murmur   . LBBB (left bundle branch block)   . Shortness of breath     exertion  . DM2 (diabetes mellitus, type 2)     fasting blood sugar - 90s  . Dizziness   . Testicular cancer     s/p XRT and surgical resection.  . Chronic lymphocytic leukemia     followed by Dr. Candyce Churn  - CHB post-op AoV surgery.  Medtronic dual chamber PCM.   Current Outpatient Prescriptions  Medication Sig Dispense Refill  . acetaminophen (TYLENOL) 500 MG tablet Take 500 mg by mouth 3 (three) times daily.       Marland Kitchen aspirin EC 325 MG EC tablet Take 1 tablet (325 mg total) by mouth daily.  30 tablet  0  . atorvastatin (LIPITOR) 40 MG tablet Take 40 mg by mouth daily.      . bifidobacterium infantis (ALIGN) capsule Take 1 capsule by mouth daily.      Marland Kitchen  busPIRone (BUSPAR) 15 MG tablet Take 15-30 mg by mouth 2 (two) times daily. One tablet every morning and two tablets every day at noon      . Cholestyramine POWD Take 1 scoop by mouth 2 (two) times daily.       . cycloSPORINE (RESTASIS) 0.05 % ophthalmic emulsion Place 1 drop into both eyes 2 (two) times daily.       Marland Kitchen desloratadine (CLARINEX) 5 MG tablet Take 5 mg by mouth daily.       . fenofibrate 160 MG tablet Take 160 mg by mouth daily.       . fluticasone (FLONASE) 50 MCG/ACT nasal spray Place 2 sprays into the nose daily.       Marland Kitchen FORA V12 BLOOD GLUCOSE TEST test strip       . gabapentin (NEURONTIN) 300 MG capsule Take 300 mg by mouth 2 (two) times daily. One capsule in the morning and 2 capsules at noon      .  LITETOUCH LANCETS MISC       . Multiple Vitamin (MULTIVITAMIN) tablet Take 1 tablet by mouth daily.       Bertram Gala Glycol-Propyl Glycol (SYSTANE OP) Place 1 drop into both eyes 2 (two) times daily.      Marland Kitchen PROCTOZONE-HC 2.5 % rectal cream Place 1 application rectally 2 (two) times daily.       . sertraline (ZOLOFT) 100 MG tablet Take 100 mg by mouth daily.       . traZODone (DESYREL) 50 MG tablet Take 150 mg by mouth at bedtime.        No current facility-administered medications for this encounter.     PHYSICAL EXAM: Filed Vitals:   05/15/13 0937  BP: 120/66  Pulse: 78  Weight: 127 lb 12.8 oz (57.97 kg)  SpO2: 98%    General:  well appearing. no resp difficulty; wife present HEENT: normal Neck: supple. JVP 7. No carotid bruit.  Cor: PMI nondisplaced. Regular rate & rhythm. 1/6 SEM RUSB.  Lungs: clear Abdomen: soft, nontender, nondistended. No hepatosplenomegaly. No bruits or masses. Good bowel sounds. Extremities: no cyanosis, clubbing, rash, 1+ edema 1/3 up right leg and 1+ ankle edema on the left. Neuro: alert & orientedx3, cranial nerves grossly intact. moves all 4 extremities w/o difficulty. affect pleasant  ASSESSMENT & PLAN: 1. Status post AVR: Had post-op baseline echo in 6/14.  Well-seated valve.  Doing cardiac rehab now.  He has some lower extremity swelling but does not appear to have significant CHF.  I asked him to watch his salt intake and use compression stockings.   2. Complete heart block: Medtronic PCM.   3. Diagonal-PA fistula: Ligated at time of AVR.   Followup in 6 months unless a problem develops.   Marca Ancona 05/15/2013

## 2013-05-16 ENCOUNTER — Encounter (HOSPITAL_COMMUNITY)
Admission: RE | Admit: 2013-05-16 | Discharge: 2013-05-16 | Disposition: A | Discharge status: Home / Self Care | Payer: Medicare Other | Source: Ambulatory Visit | Attending: Internal Medicine | Admitting: Internal Medicine

## 2013-05-19 ENCOUNTER — Encounter (HOSPITAL_COMMUNITY)
Admission: RE | Admit: 2013-05-19 | Discharge: 2013-05-19 | Disposition: A | Discharge status: Home / Self Care | Payer: Medicare Other | Source: Ambulatory Visit | Attending: Internal Medicine | Admitting: Internal Medicine

## 2013-05-21 ENCOUNTER — Encounter (HOSPITAL_COMMUNITY)
Admission: RE | Admit: 2013-05-21 | Discharge: 2013-05-21 | Disposition: A | Discharge status: Home / Self Care | Payer: Medicare Other | Source: Ambulatory Visit | Attending: Internal Medicine | Admitting: Internal Medicine

## 2013-05-23 ENCOUNTER — Encounter (HOSPITAL_COMMUNITY)
Admission: RE | Admit: 2013-05-23 | Discharge: 2013-05-23 | Disposition: A | Discharge status: Home / Self Care | Payer: Medicare Other | Source: Ambulatory Visit | Attending: Internal Medicine | Admitting: Internal Medicine

## 2013-05-26 ENCOUNTER — Encounter (HOSPITAL_COMMUNITY)
Admission: RE | Admit: 2013-05-26 | Discharge: 2013-05-26 | Disposition: A | Discharge status: Home / Self Care | Payer: Medicare Other | Source: Ambulatory Visit | Attending: Internal Medicine | Admitting: Internal Medicine

## 2013-05-28 ENCOUNTER — Encounter (HOSPITAL_COMMUNITY)
Admission: RE | Admit: 2013-05-28 | Discharge: 2013-05-28 | Disposition: A | Discharge status: Home / Self Care | Payer: Medicare Other | Source: Ambulatory Visit | Attending: Internal Medicine | Admitting: Internal Medicine

## 2013-05-28 DIAGNOSIS — Z5189 Encounter for other specified aftercare: Secondary | ICD-10-CM | POA: Insufficient documentation

## 2013-05-28 DIAGNOSIS — I5032 Chronic diastolic (congestive) heart failure: Secondary | ICD-10-CM | POA: Diagnosis not present

## 2013-05-28 DIAGNOSIS — C9110 Chronic lymphocytic leukemia of B-cell type not having achieved remission: Secondary | ICD-10-CM | POA: Diagnosis not present

## 2013-05-30 ENCOUNTER — Encounter: Payer: Self-pay | Admitting: Internal Medicine

## 2013-05-30 ENCOUNTER — Encounter (HOSPITAL_COMMUNITY)
Admission: RE | Admit: 2013-05-30 | Discharge: 2013-05-30 | Disposition: A | Discharge status: Home / Self Care | Payer: Medicare Other | Source: Ambulatory Visit | Attending: Internal Medicine | Admitting: Internal Medicine

## 2013-05-30 DIAGNOSIS — Z5189 Encounter for other specified aftercare: Secondary | ICD-10-CM | POA: Diagnosis not present

## 2013-05-30 DIAGNOSIS — C9110 Chronic lymphocytic leukemia of B-cell type not having achieved remission: Secondary | ICD-10-CM | POA: Diagnosis not present

## 2013-05-30 DIAGNOSIS — I5032 Chronic diastolic (congestive) heart failure: Secondary | ICD-10-CM | POA: Diagnosis not present

## 2013-06-02 ENCOUNTER — Encounter (HOSPITAL_COMMUNITY)
Admission: RE | Admit: 2013-06-02 | Discharge: 2013-06-02 | Disposition: A | Discharge status: Home / Self Care | Payer: Medicare Other | Source: Ambulatory Visit | Attending: Internal Medicine | Admitting: Internal Medicine

## 2013-06-02 DIAGNOSIS — Z23 Encounter for immunization: Secondary | ICD-10-CM | POA: Diagnosis not present

## 2013-06-02 DIAGNOSIS — I5032 Chronic diastolic (congestive) heart failure: Secondary | ICD-10-CM | POA: Diagnosis not present

## 2013-06-02 DIAGNOSIS — C9110 Chronic lymphocytic leukemia of B-cell type not having achieved remission: Secondary | ICD-10-CM | POA: Diagnosis not present

## 2013-06-02 DIAGNOSIS — Z5189 Encounter for other specified aftercare: Secondary | ICD-10-CM | POA: Diagnosis not present

## 2013-06-03 ENCOUNTER — Encounter: Payer: Medicare Other | Admitting: Internal Medicine

## 2013-06-04 ENCOUNTER — Encounter (HOSPITAL_COMMUNITY)
Admission: RE | Admit: 2013-06-04 | Discharge: 2013-06-04 | Disposition: A | Discharge status: Home / Self Care | Payer: Medicare Other | Source: Ambulatory Visit | Attending: Internal Medicine | Admitting: Internal Medicine

## 2013-06-04 DIAGNOSIS — C9110 Chronic lymphocytic leukemia of B-cell type not having achieved remission: Secondary | ICD-10-CM | POA: Diagnosis not present

## 2013-06-04 DIAGNOSIS — Z5189 Encounter for other specified aftercare: Secondary | ICD-10-CM | POA: Diagnosis not present

## 2013-06-04 DIAGNOSIS — I5032 Chronic diastolic (congestive) heart failure: Secondary | ICD-10-CM | POA: Diagnosis not present

## 2013-06-05 ENCOUNTER — Encounter: Payer: Medicare Other | Admitting: Internal Medicine

## 2013-06-06 ENCOUNTER — Encounter (HOSPITAL_COMMUNITY)
Admission: RE | Admit: 2013-06-06 | Discharge: 2013-06-06 | Disposition: A | Discharge status: Home / Self Care | Payer: Medicare Other | Source: Ambulatory Visit | Attending: Internal Medicine | Admitting: Internal Medicine

## 2013-06-06 DIAGNOSIS — C9110 Chronic lymphocytic leukemia of B-cell type not having achieved remission: Secondary | ICD-10-CM | POA: Diagnosis not present

## 2013-06-06 DIAGNOSIS — I5032 Chronic diastolic (congestive) heart failure: Secondary | ICD-10-CM | POA: Diagnosis not present

## 2013-06-06 DIAGNOSIS — Z5189 Encounter for other specified aftercare: Secondary | ICD-10-CM | POA: Diagnosis not present

## 2013-06-09 ENCOUNTER — Encounter: Payer: Self-pay | Admitting: Internal Medicine

## 2013-06-09 ENCOUNTER — Ambulatory Visit (INDEPENDENT_AMBULATORY_CARE_PROVIDER_SITE_OTHER): Payer: Medicare Other | Admitting: Internal Medicine

## 2013-06-09 ENCOUNTER — Encounter (HOSPITAL_COMMUNITY)
Admission: RE | Admit: 2013-06-09 | Discharge: 2013-06-09 | Disposition: A | Discharge status: Home / Self Care | Payer: Medicare Other | Source: Ambulatory Visit | Attending: Internal Medicine | Admitting: Internal Medicine

## 2013-06-09 VITALS — BP 117/63 | HR 72 | Ht 64.0 in | Wt 125.0 lb

## 2013-06-09 DIAGNOSIS — Z950 Presence of cardiac pacemaker: Secondary | ICD-10-CM | POA: Diagnosis not present

## 2013-06-09 DIAGNOSIS — I442 Atrioventricular block, complete: Secondary | ICD-10-CM

## 2013-06-09 DIAGNOSIS — I5032 Chronic diastolic (congestive) heart failure: Secondary | ICD-10-CM | POA: Diagnosis not present

## 2013-06-09 LAB — PACEMAKER DEVICE OBSERVATION
ATRIAL PACING PM: 0
BAMS-0001: 150 {beats}/min
RV LEAD IMPEDENCE PM: 578 Ohm
VENTRICULAR PACING PM: 100

## 2013-06-09 MED ORDER — ASPIRIN 81 MG PO TABS: 81.0000 mg | ORAL_TABLET | Freq: Every day | ORAL | Status: AC

## 2013-06-09 NOTE — Assessment & Plan Note (Signed)
Stable post pacing 

## 2013-06-09 NOTE — Progress Notes (Signed)
Patient Care Team: Emeterio Reeve, MD as PCP - General (Family Medicine) Johnn Hai as Referring Physician (Family Medicine) Dolores Patty, MD as Attending Physician (Cardiology) Reginia Naas as Referring Physician (Internal Medicine)   HPI  [name] is a 72 y.o. male Seen for pacemaker followup.  Underwent PPM implantation 6/14 for CHB following AVR  He has no significant CP or sob He has significant edema which dates back to his surgery. Prior to surgery he had been on diuretics and potassium; these were discontinued.  Past Medical History  Diagnosis Date  . Aortic stenosis     moderate. Echo 3/10: EF 55-60%. mean gradient 25 mmHg. AVA 1.23cm2. Echo 3/11: Normal EF. mean AVA gradient . exercise myoview 2008: positive ECG. normal perfusion  . Exposure to secondhand smoke     extensive exposure  . Hyperlipidemia     treated  . Depression   . Asthma   . Heart murmur   . LBBB (left bundle branch block)   . Shortness of breath     exertion  . DM2 (diabetes mellitus, type 2)     fasting blood sugar - 90s  . Dizziness   . Testicular cancer     s/p XRT and surgical resection.  . Chronic lymphocytic leukemia     followed by Dr. Candyce Churn    Past Surgical History  Procedure Laterality Date  . Xrt and surgical resection      s/p. 10 years ago  . Stapendectomy  07/30/02    right  . Inner ear surgery    . Cyst removal neck    . Aortic valve replacement N/A 02/17/2013    Procedure: AORTIC VALVE REPLACEMENT (AVR);  Surgeon: [name], MD;  Location[facility] OR;  Service: Open Heart Surgery;  Laterality: N/A;  . Intraoperative transesophageal echocardiogram N/A 02/17/2013    Procedure: INTRAOPERATIVE TRANSESOPHAGEAL ECHOCARDIOGRAM;  Surgeon: [name], MD;  Location: [facility] OR;  Service: Open Heart Surgery;  Laterality: N/A;    Current Outpatient Prescriptions  Medication Sig Dispense Refill  . acetaminophen (TYLENOL) 500 MG tablet Take 500  mg by mouth 3 (three) times daily.       Marland Kitchen aspirin EC 325 MG EC tablet Take 1 tablet (325 mg total) by mouth daily.  30 tablet  0  . atorvastatin (LIPITOR) 40 MG tablet Take 40 mg by mouth daily.      . bifidobacterium infantis (ALIGN) capsule Take 1 capsule by mouth daily.      . busPIRone (BUSPAR) 15 MG tablet Take 15-30 mg by mouth 2 (two) times daily. One tablet every morning and two tablets every day at noon      . Cholestyramine POWD Take 1 scoop by mouth 2 (two) times daily.       . cycloSPORINE (RESTASIS) 0.05 % ophthalmic emulsion Place 1 drop into both eyes 2 (two) times daily.       Marland Kitchen desloratadine (CLARINEX) 5 MG tablet Take 5 mg by mouth daily.       . fenofibrate 160 MG tablet Take 160 mg by mouth daily.       . fluticasone (FLONASE) 50 MCG/ACT nasal spray Place 2 sprays into the nose daily.       Marland Kitchen FORA V12 BLOOD GLUCOSE TEST test strip       . gabapentin (NEURONTIN) 300 MG capsule Take 300 mg by mouth 2 (two) times daily. One capsule in the morning and 2 capsules at  noon      . LITETOUCH LANCETS MISC       . Multiple Vitamin (MULTIVITAMIN) tablet Take 1 tablet by mouth daily.       Bertram Gala Glycol-Propyl Glycol (SYSTANE OP) Place 1 drop into both eyes 2 (two) times daily.      Marland Kitchen PROCTOZONE-HC 2.5 % rectal cream Place 1 application rectally 2 (two) times daily.       . sertraline (ZOLOFT) 100 MG tablet Take 100 mg by mouth daily.       . traZODone (DESYREL) 50 MG tablet Take 150 mg by mouth at bedtime.        No current facility-administered medications for this visit.    Allergies  Allergen Reactions  . Banana Anaphylaxis  . Penicillins Rash    Review of Systems negative except from HPI and PMH  Physical Exam BP 117/63  Pulse 72  Ht 5\' 4"  (1.626 m)  Wt 125 lb (56.7 kg)  BMI 21.45 kg/m2  Well developed and well nourished in no acute distress HENT normal E scleral and icterus clear Neck Supple JVP flat; carotids brisk and full Clear to ausculation  Device  pocket well healed; without hematoma or erythema.  There is no tethering  regular rate and rhythm, early systolic murmur Soft with active bowel sounds No clubbing cyanosis 2+  Edema Alert and oriented, grossly normal motor and sensory function Skin Warm and Dry  ECG demonstrates P. synchronous pacing Assessment and  Plan

## 2013-06-09 NOTE — Patient Instructions (Addendum)
Your physician recommends that you schedule a follow-up appointment in: 2 weeks with Nehemiah Settle Edmisten PA/Scott Alben Spittle PA  Your physician recommends that you return for lab work in: 2 weeks for a BMET   Your physician has recommended you make the following change in your medication:  1) Take your Furosemide and KCL daily for 7 days 2) Then take your Furosemide and KCL every other day for 7 days  Remote monitoring is used to monitor your Pacemaker  from home. This monitoring reduces the number of office visits required to check your device to one time per year. It allows [place] to keep an eye on the functioning of your device to ensure it is working properly. You are scheduled for a device check from home on 09/12/2013. You may send your transmission at any time that day. If you have a wireless device, the transmission will be sent automatically. After your physician reviews your transmission, you will receive a postcard with your next transmission date.  Your physician wants you to follow-up in: 9 months with Dr. Graciela Husbands.  You will receive a reminder letter in the mail two months in advance. If you don't receive a letter, please call our office to schedule the follow-up appointment.

## 2013-06-09 NOTE — Assessment & Plan Note (Signed)
We will continue on aspirin review of guidelines suggest that 100 mg is adequate so we'll reduce into a baby aspirin

## 2013-06-09 NOTE — Assessment & Plan Note (Signed)
The patient's device was interrogated and the information was fully reviewed.  The device was reprogrammed to maximize longevity  

## 2013-06-09 NOTE — Assessment & Plan Note (Signed)
Patient has volume overload with is of edema. Will put him back on his furosemide and potassium that he was taking at home. The daily for 1 week and every other day for one week we'll see him in about 2 weeks time with a metabolic profile

## 2013-06-11 ENCOUNTER — Encounter (HOSPITAL_COMMUNITY)
Admission: RE | Admit: 2013-06-11 | Discharge: 2013-06-11 | Disposition: A | Discharge status: Home / Self Care | Payer: Medicare Other | Source: Ambulatory Visit | Attending: Internal Medicine | Admitting: Internal Medicine

## 2013-06-11 DIAGNOSIS — I5032 Chronic diastolic (congestive) heart failure: Secondary | ICD-10-CM | POA: Diagnosis not present

## 2013-06-11 DIAGNOSIS — Z5189 Encounter for other specified aftercare: Secondary | ICD-10-CM | POA: Diagnosis not present

## 2013-06-11 DIAGNOSIS — C9110 Chronic lymphocytic leukemia of B-cell type not having achieved remission: Secondary | ICD-10-CM | POA: Diagnosis not present

## 2013-06-13 ENCOUNTER — Encounter (HOSPITAL_COMMUNITY)
Admission: RE | Admit: 2013-06-13 | Discharge: 2013-06-13 | Disposition: A | Discharge status: Home / Self Care | Payer: Medicare Other | Source: Ambulatory Visit | Attending: Internal Medicine | Admitting: Internal Medicine

## 2013-06-13 DIAGNOSIS — C9110 Chronic lymphocytic leukemia of B-cell type not having achieved remission: Secondary | ICD-10-CM | POA: Diagnosis not present

## 2013-06-13 DIAGNOSIS — Z5189 Encounter for other specified aftercare: Secondary | ICD-10-CM | POA: Diagnosis not present

## 2013-06-13 DIAGNOSIS — I5032 Chronic diastolic (congestive) heart failure: Secondary | ICD-10-CM | POA: Diagnosis not present

## 2013-06-16 ENCOUNTER — Encounter (HOSPITAL_COMMUNITY)
Admission: RE | Admit: 2013-06-16 | Discharge: 2013-06-16 | Disposition: A | Discharge status: Home / Self Care | Payer: Medicare Other | Source: Ambulatory Visit | Attending: Internal Medicine | Admitting: Internal Medicine

## 2013-06-16 DIAGNOSIS — Z5189 Encounter for other specified aftercare: Secondary | ICD-10-CM | POA: Diagnosis not present

## 2013-06-16 DIAGNOSIS — C9110 Chronic lymphocytic leukemia of B-cell type not having achieved remission: Secondary | ICD-10-CM | POA: Diagnosis not present

## 2013-06-16 DIAGNOSIS — I5032 Chronic diastolic (congestive) heart failure: Secondary | ICD-10-CM | POA: Diagnosis not present

## 2013-06-18 ENCOUNTER — Encounter (HOSPITAL_COMMUNITY)
Admission: RE | Admit: 2013-06-18 | Discharge: 2013-06-18 | Disposition: A | Discharge status: Home / Self Care | Payer: Medicare Other | Source: Ambulatory Visit | Attending: Internal Medicine | Admitting: Internal Medicine

## 2013-06-18 DIAGNOSIS — I5032 Chronic diastolic (congestive) heart failure: Secondary | ICD-10-CM | POA: Diagnosis not present

## 2013-06-18 DIAGNOSIS — Z5189 Encounter for other specified aftercare: Secondary | ICD-10-CM | POA: Diagnosis not present

## 2013-06-18 DIAGNOSIS — C9110 Chronic lymphocytic leukemia of B-cell type not having achieved remission: Secondary | ICD-10-CM | POA: Diagnosis not present

## 2013-06-20 ENCOUNTER — Encounter (HOSPITAL_COMMUNITY)
Admission: RE | Admit: 2013-06-20 | Discharge: 2013-06-20 | Disposition: A | Discharge status: Home / Self Care | Payer: Medicare Other | Source: Ambulatory Visit | Attending: Internal Medicine | Admitting: Internal Medicine

## 2013-06-20 DIAGNOSIS — I5032 Chronic diastolic (congestive) heart failure: Secondary | ICD-10-CM | POA: Diagnosis not present

## 2013-06-20 DIAGNOSIS — Z5189 Encounter for other specified aftercare: Secondary | ICD-10-CM | POA: Diagnosis not present

## 2013-06-20 DIAGNOSIS — C9110 Chronic lymphocytic leukemia of B-cell type not having achieved remission: Secondary | ICD-10-CM | POA: Diagnosis not present

## 2013-06-20 NOTE — Progress Notes (Signed)
Ron graduates today and plans to continue exercise in the maintenance program.

## 2013-06-23 ENCOUNTER — Encounter (HOSPITAL_COMMUNITY)
Admission: RE | Admit: 2013-06-23 | Discharge: 2013-06-23 | Disposition: A | Discharge status: Home / Self Care | Payer: Self-pay | Source: Ambulatory Visit | Attending: Internal Medicine | Admitting: Internal Medicine

## 2013-06-23 ENCOUNTER — Encounter: Payer: Self-pay | Admitting: Internal Medicine

## 2013-06-23 ENCOUNTER — Encounter (HOSPITAL_COMMUNITY): Payer: Medicare Other

## 2013-06-23 DIAGNOSIS — I5032 Chronic diastolic (congestive) heart failure: Secondary | ICD-10-CM | POA: Insufficient documentation

## 2013-06-23 DIAGNOSIS — Z5189 Encounter for other specified aftercare: Secondary | ICD-10-CM | POA: Insufficient documentation

## 2013-06-23 DIAGNOSIS — Z954 Presence of other heart-valve replacement: Secondary | ICD-10-CM | POA: Insufficient documentation

## 2013-06-23 DIAGNOSIS — C9110 Chronic lymphocytic leukemia of B-cell type not having achieved remission: Secondary | ICD-10-CM | POA: Insufficient documentation

## 2013-06-24 ENCOUNTER — Other Ambulatory Visit: Payer: Medicare Other

## 2013-06-24 ENCOUNTER — Ambulatory Visit (INDEPENDENT_AMBULATORY_CARE_PROVIDER_SITE_OTHER): Payer: Medicare Other | Admitting: Cardiology

## 2013-06-24 ENCOUNTER — Encounter: Payer: Self-pay | Admitting: Cardiology

## 2013-06-24 VITALS — BP 104/70 | HR 80 | Ht 64.0 in | Wt 129.0 lb

## 2013-06-24 DIAGNOSIS — I359 Nonrheumatic aortic valve disorder, unspecified: Secondary | ICD-10-CM | POA: Diagnosis not present

## 2013-06-24 DIAGNOSIS — I509 Heart failure, unspecified: Secondary | ICD-10-CM

## 2013-06-24 DIAGNOSIS — I5032 Chronic diastolic (congestive) heart failure: Secondary | ICD-10-CM

## 2013-06-24 DIAGNOSIS — Z950 Presence of cardiac pacemaker: Secondary | ICD-10-CM | POA: Diagnosis not present

## 2013-06-24 DIAGNOSIS — Z952 Presence of prosthetic heart valve: Secondary | ICD-10-CM

## 2013-06-24 DIAGNOSIS — Z954 Presence of other heart-valve replacement: Secondary | ICD-10-CM

## 2013-06-24 DIAGNOSIS — I350 Nonrheumatic aortic (valve) stenosis: Secondary | ICD-10-CM

## 2013-06-24 DIAGNOSIS — I5033 Acute on chronic diastolic (congestive) heart failure: Secondary | ICD-10-CM | POA: Diagnosis not present

## 2013-06-24 DIAGNOSIS — I442 Atrioventricular block, complete: Secondary | ICD-10-CM

## 2013-06-24 LAB — BASIC METABOLIC PANEL
BUN: 27 mg/dL — ABNORMAL HIGH (ref 6–23)
Chloride: 106 mEq/L (ref 96–112)
Potassium: 4.4 mEq/L (ref 3.5–5.1)
Sodium: 140 mEq/L (ref 135–145)

## 2013-06-24 NOTE — Progress Notes (Signed)
ELECTROPHYSIOLOGY OFFICE NOTE  Patient ID: [name] MRN: [id number], DOB/AGE: [birth year]   Date of Visit: 06/24/2013  Primary Physician: Emeterio Reeve, MD Primary Cardiologist / Primary EP: Shirlee Latch, MD / Graciela Husbands, MD Reason for Visit: 2-week follow-up for chronic diastolic HF  History of Present Illness  [name] is a 72 y.o. male with AS and CHB following AVR June 2014 who underwent dual chamber PPM at that time. He was seen in follow-up 2 weeks ago by Dr. Graciela Husbands and was found to have acute diastolic HF. He was restarted on furosemide and potassium. He presents today for follow-up.      Since last being seen in our clinic, he reports he is doing well and has no complaints. His LE swelling improved more noticeably while taking Lasix daily but remains stable with every other day dosing. He is trying to watch his salt intake but ate crab legs over the weekend. He denies chest pain or shortness of breath. He denies palpitations, dizziness, near syncope or syncope. He orthopnea, PND or recent weight gain. He is compliant and tolerating medications without difficulty.  Past Medical History Past Medical History  Diagnosis Date  . Aortic stenosis     s/p AVR 6/14  . Exposure to secondhand smoke     extensive exposure  . Hyperlipidemia     treated  . Depression   . Asthma   . Heart murmur   . LBBB (left bundle branch block)   . Shortness of breath     exertion  . DM2 (diabetes mellitus, type 2)     fasting blood sugar - 90s  . Dizziness   . Testicular cancer     s/p XRT and surgical resection.  . Chronic lymphocytic leukemia     followed by Dr. Candyce Churn  . Atrioventricular block, complete   . Pacemaker     Past Surgical History Past Surgical History  Procedure Laterality Date  . Xrt and surgical resection      s/p. 10 years ago  . Stapendectomy  07/30/02    right  . Inner ear surgery    . Cyst removal neck    . Aortic valve replacement N/A 02/17/2013   Procedure: AORTIC VALVE REPLACEMENT (AVR);  Surgeon: [name], MD;  Location: [facility] OR;  Service: Open Heart Surgery;  Laterality: N/A;  . Intraoperative transesophageal echocardiogram N/A 02/17/2013    Procedure: INTRAOPERATIVE TRANSESOPHAGEAL ECHOCARDIOGRAM;  Surgeon: [name], MD;  Location: [facility] OR;  Service: Open Heart Surgery;  Laterality: N/A;    Allergies/Intolerances Allergies  Allergen Reactions  . Banana Anaphylaxis  . Penicillins Rash    Current Home Medications Current Outpatient Prescriptions  Medication Sig Dispense Refill  . acetaminophen (TYLENOL) 500 MG tablet Take 500 mg by mouth 3 (three) times daily.       Marland Kitchen aspirin 81 MG tablet Take 1 tablet (81 mg total) by mouth daily.  30 tablet  11  . atorvastatin (LIPITOR) 40 MG tablet Take 40 mg by mouth daily.      . bifidobacterium infantis (ALIGN) capsule Take 1 capsule by mouth daily.      . busPIRone (BUSPAR) 15 MG tablet Take by mouth. One tablet every morning and two tablets every day at noon      . Cholestyramine POWD Take 1 scoop by mouth 2 (two) times daily.       . cycloSPORINE (RESTASIS) 0.05 % ophthalmic emulsion Place 1 drop into both eyes 2 (two) times daily.       Marland Kitchen  desloratadine (CLARINEX) 5 MG tablet Take 5 mg by mouth daily.       . fenofibrate 160 MG tablet Take 160 mg by mouth daily.       . fluticasone (FLONASE) 50 MCG/ACT nasal spray Place 2 sprays into the nose daily.       Marland Kitchen FORA V12 BLOOD GLUCOSE TEST test strip       . gabapentin (NEURONTIN) 300 MG capsule One capsule in the morning and 2 capsules at noon      . LITETOUCH LANCETS MISC       . Multiple Vitamin (MULTIVITAMIN) tablet Take 1 tablet by mouth daily.       Bertram Gala Glycol-Propyl Glycol (SYSTANE OP) Place 1 drop into both eyes 2 (two) times daily.      Marland Kitchen PROCTOZONE-HC 2.5 % rectal cream Place 1 application rectally 2 (two) times daily.       . sertraline (ZOLOFT) 100 MG tablet Take 100 mg by mouth daily.       . traZODone  (DESYREL) 50 MG tablet Take 150 mg by mouth at bedtime.        No current facility-administered medications for this visit.    Social History Social History  . Marital Status: Married   Social History Main Topics  . Smoking status: Never Smoker   . Smokeless tobacco: No  . Alcohol Use: No  . Drug Use: No   Review of Systems General: No chills, fever, night sweats or weight changes Cardiovascular: No chest pain, dyspnea on exertion, edema, orthopnea, palpitations, paroxysmal nocturnal dyspnea Dermatological: No rash, lesions or masses Respiratory: No cough, dyspnea Urologic: No hematuria, dysuria Abdominal: No nausea, vomiting, diarrhea, bright red blood per rectum, melena, or hematemesis Neurologic: No visual changes, weakness, changes in mental status All other systems reviewed and are otherwise negative except as noted above.  Physical Exam Vitals: Blood pressure 104/70, pulse 80, height 5\' 4"  (1.626 m), weight 129 lb (58.514 kg), SpO2 97.00%.  General: Well developed, well appearing 72 y.o. male in no acute distress. HEENT: Normocephalic, atraumatic. EOMs intact. Sclera nonicteric. Oropharynx clear.  Neck: Supple. No JVD. Lungs: Respirations regular and unlabored, CTA bilaterally. No wheezes, rales or rhonchi. Heart: RRR. S1, S2 present. No murmurs, rub, S3 or S4. Abdomen: Soft, non-distended.   Extremities: No clubbing or cyanosis. Trace pedal edema at ankles bilaterally. PT/Radials 2+ and equal bilaterally. Psych: Normal affect. Neuro: Alert and oriented X 3. Moves all extremities spontaneously.   Diagnostics BMET pending Device interrogation - full interrogation done at Dr. Odessa Fleming visit 06/09/2013 - normal PPM function  Assessment and Plan 1. Acute on chronic diastolic HF 2. CHB s/p PPM implant 3. AS s/p AVR  [name] edema is somewhat improved / stable with Lasix. He has no other symptoms or concerns. He has been trying to watch his salt intake but ate crab  legs over the weekend. Will check BMET today. May need to continue Lasix daily going forward pending BMET results.    Signed, Rick Duff, PA-C 06/24/2013, 9:50 AM

## 2013-06-24 NOTE — Addendum Note (Signed)
Addended by: Tonita Phoenix on: 06/24/2013 09:52 AM   Modules accepted: Orders

## 2013-06-24 NOTE — Patient Instructions (Signed)
Your physician recommends that you return for lab work in: BMET TODAY

## 2013-06-25 ENCOUNTER — Encounter (HOSPITAL_COMMUNITY)
Admission: RE | Admit: 2013-06-25 | Discharge: 2013-06-25 | Disposition: A | Discharge status: Home / Self Care | Payer: Self-pay | Source: Ambulatory Visit | Attending: Internal Medicine | Admitting: Internal Medicine

## 2013-06-25 ENCOUNTER — Encounter (HOSPITAL_COMMUNITY): Payer: Medicare Other

## 2013-06-27 ENCOUNTER — Encounter (HOSPITAL_COMMUNITY)
Admission: RE | Admit: 2013-06-27 | Discharge: 2013-06-27 | Disposition: A | Discharge status: Home / Self Care | Payer: Self-pay | Source: Ambulatory Visit | Attending: Internal Medicine | Admitting: Internal Medicine

## 2013-06-27 ENCOUNTER — Encounter (HOSPITAL_COMMUNITY): Payer: Medicare Other

## 2013-06-30 ENCOUNTER — Encounter (HOSPITAL_COMMUNITY)
Admission: RE | Admit: 2013-06-30 | Discharge: 2013-06-30 | Disposition: A | Discharge status: Home / Self Care | Payer: Self-pay | Source: Ambulatory Visit | Attending: Internal Medicine | Admitting: Internal Medicine

## 2013-06-30 DIAGNOSIS — I2510 Atherosclerotic heart disease of native coronary artery without angina pectoris: Secondary | ICD-10-CM | POA: Insufficient documentation

## 2013-06-30 DIAGNOSIS — Z5189 Encounter for other specified aftercare: Secondary | ICD-10-CM | POA: Insufficient documentation

## 2013-06-30 DIAGNOSIS — Z9861 Coronary angioplasty status: Secondary | ICD-10-CM | POA: Insufficient documentation

## 2013-07-02 ENCOUNTER — Encounter (HOSPITAL_COMMUNITY)
Admission: RE | Admit: 2013-07-02 | Discharge: 2013-07-02 | Disposition: A | Discharge status: Home / Self Care | Payer: Self-pay | Source: Ambulatory Visit | Attending: Internal Medicine | Admitting: Internal Medicine

## 2013-07-03 ENCOUNTER — Other Ambulatory Visit: Payer: Self-pay | Admitting: Internal Medicine

## 2013-07-03 ENCOUNTER — Other Ambulatory Visit: Payer: Self-pay

## 2013-07-03 DIAGNOSIS — C7A098 Malignant carcinoid tumors of other sites: Secondary | ICD-10-CM

## 2013-07-04 ENCOUNTER — Encounter (HOSPITAL_COMMUNITY)
Admission: RE | Admit: 2013-07-04 | Discharge: 2013-07-04 | Disposition: A | Discharge status: Home / Self Care | Payer: Self-pay | Source: Ambulatory Visit | Attending: Internal Medicine | Admitting: Internal Medicine

## 2013-07-07 ENCOUNTER — Encounter (HOSPITAL_COMMUNITY)
Admission: RE | Admit: 2013-07-07 | Discharge: 2013-07-07 | Disposition: A | Discharge status: Home / Self Care | Payer: Self-pay | Source: Ambulatory Visit | Attending: Internal Medicine | Admitting: Internal Medicine

## 2013-07-09 ENCOUNTER — Encounter (HOSPITAL_COMMUNITY)
Admission: RE | Admit: 2013-07-09 | Discharge: 2013-07-09 | Disposition: A | Discharge status: Home / Self Care | Payer: Self-pay | Source: Ambulatory Visit | Attending: Internal Medicine | Admitting: Internal Medicine

## 2013-07-11 ENCOUNTER — Encounter (HOSPITAL_COMMUNITY)
Admission: RE | Admit: 2013-07-11 | Discharge: 2013-07-11 | Disposition: A | Discharge status: Home / Self Care | Payer: Self-pay | Source: Ambulatory Visit | Attending: Internal Medicine | Admitting: Internal Medicine

## 2013-07-14 ENCOUNTER — Encounter (HOSPITAL_COMMUNITY)
Admission: RE | Admit: 2013-07-14 | Discharge: 2013-07-14 | Disposition: A | Discharge status: Home / Self Care | Payer: Self-pay | Source: Ambulatory Visit | Attending: Internal Medicine | Admitting: Internal Medicine

## 2013-07-15 DIAGNOSIS — H9190 Unspecified hearing loss, unspecified ear: Secondary | ICD-10-CM | POA: Diagnosis not present

## 2013-07-15 DIAGNOSIS — H908 Mixed conductive and sensorineural hearing loss, unspecified: Secondary | ICD-10-CM | POA: Diagnosis not present

## 2013-07-15 DIAGNOSIS — H905 Unspecified sensorineural hearing loss: Secondary | ICD-10-CM | POA: Diagnosis not present

## 2013-07-16 ENCOUNTER — Encounter (HOSPITAL_COMMUNITY)
Admission: RE | Admit: 2013-07-16 | Discharge: 2013-07-16 | Disposition: A | Discharge status: Home / Self Care | Payer: Self-pay | Source: Ambulatory Visit | Attending: Internal Medicine | Admitting: Internal Medicine

## 2013-07-18 ENCOUNTER — Encounter (HOSPITAL_COMMUNITY)
Admission: RE | Admit: 2013-07-18 | Discharge: 2013-07-18 | Disposition: A | Discharge status: Home / Self Care | Payer: Self-pay | Source: Ambulatory Visit | Attending: Internal Medicine | Admitting: Internal Medicine

## 2013-07-21 ENCOUNTER — Encounter (HOSPITAL_COMMUNITY)
Admission: RE | Admit: 2013-07-21 | Discharge: 2013-07-21 | Disposition: A | Discharge status: Home / Self Care | Payer: Self-pay | Source: Ambulatory Visit | Attending: Internal Medicine | Admitting: Internal Medicine

## 2013-07-23 ENCOUNTER — Encounter (HOSPITAL_COMMUNITY)
Admission: RE | Admit: 2013-07-23 | Discharge: 2013-07-23 | Disposition: A | Discharge status: Home / Self Care | Payer: Self-pay | Source: Ambulatory Visit | Attending: Internal Medicine | Admitting: Internal Medicine

## 2013-07-28 ENCOUNTER — Encounter (HOSPITAL_COMMUNITY)
Admission: RE | Admit: 2013-07-28 | Discharge: 2013-07-28 | Disposition: A | Discharge status: Home / Self Care | Payer: Self-pay | Source: Ambulatory Visit | Attending: Internal Medicine | Admitting: Internal Medicine

## 2013-07-28 DIAGNOSIS — I2510 Atherosclerotic heart disease of native coronary artery without angina pectoris: Secondary | ICD-10-CM | POA: Insufficient documentation

## 2013-07-28 DIAGNOSIS — Z9861 Coronary angioplasty status: Secondary | ICD-10-CM | POA: Insufficient documentation

## 2013-07-28 DIAGNOSIS — Z5189 Encounter for other specified aftercare: Secondary | ICD-10-CM | POA: Insufficient documentation

## 2013-07-30 ENCOUNTER — Encounter (HOSPITAL_COMMUNITY)
Admission: RE | Admit: 2013-07-30 | Discharge: 2013-07-30 | Disposition: A | Discharge status: Home / Self Care | Payer: Self-pay | Source: Ambulatory Visit | Attending: Internal Medicine | Admitting: Internal Medicine

## 2013-08-01 ENCOUNTER — Encounter (HOSPITAL_COMMUNITY)
Admission: RE | Admit: 2013-08-01 | Discharge: 2013-08-01 | Disposition: A | Discharge status: Home / Self Care | Payer: Self-pay | Source: Ambulatory Visit | Attending: Internal Medicine | Admitting: Internal Medicine

## 2013-08-04 ENCOUNTER — Encounter (HOSPITAL_COMMUNITY)
Admission: RE | Admit: 2013-08-04 | Discharge: 2013-08-04 | Disposition: A | Discharge status: Home / Self Care | Payer: Medicare Other | Source: Ambulatory Visit | Attending: Internal Medicine | Admitting: Internal Medicine

## 2013-08-06 ENCOUNTER — Encounter (HOSPITAL_COMMUNITY)
Admission: RE | Admit: 2013-08-06 | Discharge: 2013-08-06 | Disposition: A | Discharge status: Home / Self Care | Payer: Self-pay | Source: Ambulatory Visit | Attending: Internal Medicine | Admitting: Internal Medicine

## 2013-08-07 ENCOUNTER — Ambulatory Visit
Admission: RE | Admit: 2013-08-07 | Discharge: 2013-08-07 | Disposition: A | Discharge status: Home / Self Care | Payer: Medicare Other | Source: Ambulatory Visit | Attending: Internal Medicine | Admitting: Internal Medicine

## 2013-08-07 DIAGNOSIS — N200 Calculus of kidney: Secondary | ICD-10-CM | POA: Diagnosis not present

## 2013-08-07 DIAGNOSIS — C7A098 Malignant carcinoid tumors of other sites: Secondary | ICD-10-CM

## 2013-08-07 MED ORDER — IOHEXOL 300 MG/ML  SOLN
100.0000 mL | Freq: Once | INTRAMUSCULAR | Status: AC | PRN
  Administered 2013-08-07: 100 mL via INTRAVENOUS

## 2013-08-08 ENCOUNTER — Encounter (HOSPITAL_COMMUNITY)
Admission: RE | Admit: 2013-08-08 | Discharge: 2013-08-08 | Disposition: A | Discharge status: Home / Self Care | Payer: Self-pay | Source: Ambulatory Visit | Attending: Internal Medicine | Admitting: Internal Medicine

## 2013-08-11 ENCOUNTER — Encounter (HOSPITAL_COMMUNITY)
Admission: RE | Admit: 2013-08-11 | Discharge: 2013-08-11 | Disposition: A | Discharge status: Home / Self Care | Payer: Self-pay | Source: Ambulatory Visit | Attending: Internal Medicine | Admitting: Internal Medicine

## 2013-08-13 ENCOUNTER — Encounter (HOSPITAL_COMMUNITY)
Admission: RE | Admit: 2013-08-13 | Discharge: 2013-08-13 | Disposition: A | Discharge status: Home / Self Care | Payer: Self-pay | Source: Ambulatory Visit | Attending: Internal Medicine | Admitting: Internal Medicine

## 2013-08-14 DIAGNOSIS — L821 Other seborrheic keratosis: Secondary | ICD-10-CM | POA: Diagnosis not present

## 2013-08-14 DIAGNOSIS — Z0000 Encounter for general adult medical examination without abnormal findings: Secondary | ICD-10-CM | POA: Diagnosis not present

## 2013-08-14 DIAGNOSIS — N400 Enlarged prostate without lower urinary tract symptoms: Secondary | ICD-10-CM | POA: Diagnosis not present

## 2013-08-14 DIAGNOSIS — J301 Allergic rhinitis due to pollen: Secondary | ICD-10-CM | POA: Diagnosis not present

## 2013-08-14 DIAGNOSIS — R972 Elevated prostate specific antigen [PSA]: Secondary | ICD-10-CM | POA: Diagnosis not present

## 2013-08-14 DIAGNOSIS — E782 Mixed hyperlipidemia: Secondary | ICD-10-CM | POA: Diagnosis not present

## 2013-08-14 DIAGNOSIS — C9112 Chronic lymphocytic leukemia of B-cell type in relapse: Secondary | ICD-10-CM | POA: Diagnosis not present

## 2013-08-14 DIAGNOSIS — Z86010 Personal history of colonic polyps: Secondary | ICD-10-CM | POA: Diagnosis not present

## 2013-08-14 DIAGNOSIS — E1129 Type 2 diabetes mellitus with other diabetic kidney complication: Secondary | ICD-10-CM | POA: Diagnosis not present

## 2013-08-14 DIAGNOSIS — L570 Actinic keratosis: Secondary | ICD-10-CM | POA: Diagnosis not present

## 2013-08-14 DIAGNOSIS — C9110 Chronic lymphocytic leukemia of B-cell type not having achieved remission: Secondary | ICD-10-CM | POA: Diagnosis not present

## 2013-08-15 ENCOUNTER — Encounter (HOSPITAL_COMMUNITY)
Admission: RE | Admit: 2013-08-15 | Discharge: 2013-08-15 | Disposition: A | Discharge status: Home / Self Care | Payer: Self-pay | Source: Ambulatory Visit | Attending: Internal Medicine | Admitting: Internal Medicine

## 2013-08-18 ENCOUNTER — Encounter (HOSPITAL_COMMUNITY)
Admission: RE | Admit: 2013-08-18 | Discharge: 2013-08-18 | Disposition: A | Discharge status: Home / Self Care | Payer: Self-pay | Source: Ambulatory Visit | Attending: Internal Medicine | Admitting: Internal Medicine

## 2013-08-19 DIAGNOSIS — K668 Other specified disorders of peritoneum: Secondary | ICD-10-CM | POA: Diagnosis not present

## 2013-08-19 DIAGNOSIS — K7689 Other specified diseases of liver: Secondary | ICD-10-CM | POA: Diagnosis not present

## 2013-08-19 DIAGNOSIS — R1909 Other intra-abdominal and pelvic swelling, mass and lump: Secondary | ICD-10-CM | POA: Diagnosis not present

## 2013-08-20 ENCOUNTER — Encounter (HOSPITAL_COMMUNITY)
Admission: RE | Admit: 2013-08-20 | Discharge: 2013-08-20 | Disposition: A | Discharge status: Home / Self Care | Payer: Self-pay | Source: Ambulatory Visit | Attending: Internal Medicine | Admitting: Internal Medicine

## 2013-08-22 DIAGNOSIS — H903 Sensorineural hearing loss, bilateral: Secondary | ICD-10-CM | POA: Diagnosis not present

## 2013-08-25 ENCOUNTER — Encounter (HOSPITAL_COMMUNITY)
Admission: RE | Admit: 2013-08-25 | Discharge: 2013-08-25 | Disposition: A | Discharge status: Home / Self Care | Payer: Self-pay | Source: Ambulatory Visit | Attending: Internal Medicine | Admitting: Internal Medicine

## 2013-08-27 ENCOUNTER — Encounter (HOSPITAL_COMMUNITY)
Admission: RE | Admit: 2013-08-27 | Discharge: 2013-08-27 | Disposition: A | Discharge status: Home / Self Care | Payer: Self-pay | Source: Ambulatory Visit | Attending: Internal Medicine | Admitting: Internal Medicine

## 2013-08-29 ENCOUNTER — Encounter (HOSPITAL_COMMUNITY): Payer: Medicare Other

## 2013-09-01 ENCOUNTER — Encounter (HOSPITAL_COMMUNITY): Payer: Medicare Other

## 2013-09-01 DIAGNOSIS — Z9861 Coronary angioplasty status: Secondary | ICD-10-CM | POA: Insufficient documentation

## 2013-09-01 DIAGNOSIS — I2510 Atherosclerotic heart disease of native coronary artery without angina pectoris: Secondary | ICD-10-CM | POA: Insufficient documentation

## 2013-09-01 DIAGNOSIS — Z5189 Encounter for other specified aftercare: Secondary | ICD-10-CM | POA: Insufficient documentation

## 2013-09-03 ENCOUNTER — Encounter (HOSPITAL_COMMUNITY): Payer: Medicare Other

## 2013-09-05 ENCOUNTER — Encounter (HOSPITAL_COMMUNITY): Payer: Medicare Other

## 2013-09-08 ENCOUNTER — Encounter (HOSPITAL_COMMUNITY)
Admission: RE | Admit: 2013-09-08 | Discharge: 2013-09-08 | Disposition: A | Discharge status: Home / Self Care | Payer: Self-pay | Source: Ambulatory Visit | Attending: Internal Medicine | Admitting: Internal Medicine

## 2013-09-10 ENCOUNTER — Encounter (HOSPITAL_COMMUNITY): Payer: Medicare Other

## 2013-09-12 ENCOUNTER — Encounter (HOSPITAL_COMMUNITY)
Admission: RE | Admit: 2013-09-12 | Discharge: 2013-09-12 | Disposition: A | Discharge status: Home / Self Care | Payer: Self-pay | Source: Ambulatory Visit | Attending: Internal Medicine | Admitting: Internal Medicine

## 2013-09-12 ENCOUNTER — Ambulatory Visit (INDEPENDENT_AMBULATORY_CARE_PROVIDER_SITE_OTHER): Payer: Medicare Other | Admitting: *Deleted

## 2013-09-12 DIAGNOSIS — I442 Atrioventricular block, complete: Secondary | ICD-10-CM

## 2013-09-15 ENCOUNTER — Encounter (HOSPITAL_COMMUNITY)
Admission: RE | Admit: 2013-09-15 | Discharge: 2013-09-15 | Disposition: A | Discharge status: Home / Self Care | Payer: Self-pay | Source: Ambulatory Visit | Attending: Internal Medicine | Admitting: Internal Medicine

## 2013-09-17 ENCOUNTER — Encounter (HOSPITAL_COMMUNITY)
Admission: RE | Admit: 2013-09-17 | Discharge: 2013-09-17 | Disposition: A | Discharge status: Home / Self Care | Payer: Self-pay | Source: Ambulatory Visit | Attending: Internal Medicine | Admitting: Internal Medicine

## 2013-09-17 LAB — MDC_IDC_ENUM_SESS_TYPE_REMOTE
Battery Impedance: 100 Ohm
Battery Remaining Longevity: 154 mo
Brady Statistic AP VP Percent: 2 %
Brady Statistic AP VS Percent: 0 %
Brady Statistic AS VS Percent: 0 %
Date Time Interrogation Session: 20150116131947
Lead Channel Impedance Value: 533 Ohm
Lead Channel Impedance Value: 563 Ohm
Lead Channel Pacing Threshold Amplitude: 0.625 V
Lead Channel Pacing Threshold Pulse Width: 0.4 ms
Lead Channel Setting Pacing Amplitude: 2 V
Lead Channel Setting Sensing Sensitivity: 2.8 mV
MDC IDC MSMT BATTERY VOLTAGE: 2.79 V
MDC IDC MSMT LEADCHNL RA SENSING INTR AMPL: 2.8 mV
MDC IDC MSMT LEADCHNL RV PACING THRESHOLD AMPLITUDE: 0.375 V
MDC IDC MSMT LEADCHNL RV PACING THRESHOLD PULSEWIDTH: 0.4 ms
MDC IDC SET LEADCHNL RA PACING AMPLITUDE: 1.5 V
MDC IDC SET LEADCHNL RV PACING PULSEWIDTH: 0.4 ms
MDC IDC STAT BRADY AS VP PERCENT: 98 %

## 2013-09-19 ENCOUNTER — Encounter (HOSPITAL_COMMUNITY): Payer: Medicare Other

## 2013-09-22 ENCOUNTER — Encounter (HOSPITAL_COMMUNITY)
Admission: RE | Admit: 2013-09-22 | Discharge: 2013-09-22 | Disposition: A | Discharge status: Home / Self Care | Payer: Self-pay | Source: Ambulatory Visit | Attending: Internal Medicine | Admitting: Internal Medicine

## 2013-09-24 ENCOUNTER — Encounter (HOSPITAL_COMMUNITY)
Admission: RE | Admit: 2013-09-24 | Discharge: 2013-09-24 | Disposition: A | Discharge status: Home / Self Care | Payer: Self-pay | Source: Ambulatory Visit | Attending: Internal Medicine | Admitting: Internal Medicine

## 2013-09-26 ENCOUNTER — Encounter (HOSPITAL_COMMUNITY)
Admission: RE | Admit: 2013-09-26 | Discharge: 2013-09-26 | Disposition: A | Discharge status: Home / Self Care | Payer: Self-pay | Source: Ambulatory Visit | Attending: Internal Medicine | Admitting: Internal Medicine

## 2013-09-29 ENCOUNTER — Encounter (HOSPITAL_COMMUNITY)
Admission: RE | Admit: 2013-09-29 | Discharge: 2013-09-29 | Disposition: A | Discharge status: Home / Self Care | Payer: Self-pay | Source: Ambulatory Visit | Attending: Internal Medicine | Admitting: Internal Medicine

## 2013-09-29 DIAGNOSIS — I2510 Atherosclerotic heart disease of native coronary artery without angina pectoris: Secondary | ICD-10-CM | POA: Insufficient documentation

## 2013-09-29 DIAGNOSIS — Z5189 Encounter for other specified aftercare: Secondary | ICD-10-CM | POA: Insufficient documentation

## 2013-09-29 DIAGNOSIS — Z9861 Coronary angioplasty status: Secondary | ICD-10-CM | POA: Insufficient documentation

## 2013-10-01 ENCOUNTER — Encounter: Payer: Self-pay | Admitting: *Deleted

## 2013-10-01 ENCOUNTER — Encounter (HOSPITAL_COMMUNITY)
Admission: RE | Admit: 2013-10-01 | Discharge: 2013-10-01 | Disposition: A | Discharge status: Home / Self Care | Payer: Self-pay | Source: Ambulatory Visit | Attending: Internal Medicine | Admitting: Internal Medicine

## 2013-10-03 ENCOUNTER — Encounter (HOSPITAL_COMMUNITY)
Admission: RE | Admit: 2013-10-03 | Discharge: 2013-10-03 | Disposition: A | Discharge status: Home / Self Care | Payer: Self-pay | Source: Ambulatory Visit | Attending: Internal Medicine | Admitting: Internal Medicine

## 2013-10-06 ENCOUNTER — Encounter (HOSPITAL_COMMUNITY)
Admission: RE | Admit: 2013-10-06 | Discharge: 2013-10-06 | Disposition: A | Discharge status: Home / Self Care | Payer: Self-pay | Source: Ambulatory Visit | Attending: Internal Medicine | Admitting: Internal Medicine

## 2013-10-08 ENCOUNTER — Encounter (HOSPITAL_COMMUNITY)
Admission: RE | Admit: 2013-10-08 | Discharge: 2013-10-08 | Disposition: A | Discharge status: Home / Self Care | Payer: Self-pay | Source: Ambulatory Visit | Attending: Internal Medicine | Admitting: Internal Medicine

## 2013-10-09 ENCOUNTER — Encounter: Payer: Self-pay | Admitting: Internal Medicine

## 2013-10-10 ENCOUNTER — Encounter (HOSPITAL_COMMUNITY)
Admission: RE | Admit: 2013-10-10 | Discharge: 2013-10-10 | Disposition: A | Discharge status: Home / Self Care | Payer: Self-pay | Source: Ambulatory Visit | Attending: Internal Medicine | Admitting: Internal Medicine

## 2013-10-13 ENCOUNTER — Encounter (HOSPITAL_COMMUNITY)
Admission: RE | Admit: 2013-10-13 | Discharge: 2013-10-13 | Disposition: A | Discharge status: Home / Self Care | Payer: Self-pay | Source: Ambulatory Visit | Attending: Internal Medicine | Admitting: Internal Medicine

## 2013-10-15 ENCOUNTER — Encounter (HOSPITAL_COMMUNITY)
Admission: RE | Admit: 2013-10-15 | Discharge: 2013-10-15 | Disposition: A | Discharge status: Home / Self Care | Payer: Self-pay | Source: Ambulatory Visit | Attending: Internal Medicine | Admitting: Internal Medicine

## 2013-10-17 ENCOUNTER — Encounter (HOSPITAL_COMMUNITY)
Admission: RE | Admit: 2013-10-17 | Discharge: 2013-10-17 | Disposition: A | Discharge status: Home / Self Care | Payer: Self-pay | Source: Ambulatory Visit | Attending: Internal Medicine | Admitting: Internal Medicine

## 2013-10-20 ENCOUNTER — Encounter (HOSPITAL_COMMUNITY)
Admission: RE | Admit: 2013-10-20 | Discharge: 2013-10-20 | Disposition: A | Discharge status: Home / Self Care | Payer: Self-pay | Source: Ambulatory Visit | Attending: Internal Medicine | Admitting: Internal Medicine

## 2013-10-22 ENCOUNTER — Encounter (HOSPITAL_COMMUNITY)
Admission: RE | Admit: 2013-10-22 | Discharge: 2013-10-22 | Disposition: A | Discharge status: Home / Self Care | Payer: Self-pay | Source: Ambulatory Visit | Attending: Internal Medicine | Admitting: Internal Medicine

## 2013-10-24 ENCOUNTER — Encounter (HOSPITAL_COMMUNITY)
Admission: RE | Admit: 2013-10-24 | Discharge: 2013-10-24 | Disposition: A | Discharge status: Home / Self Care | Payer: Self-pay | Source: Ambulatory Visit | Attending: Internal Medicine | Admitting: Internal Medicine

## 2013-10-27 ENCOUNTER — Encounter (HOSPITAL_COMMUNITY)
Admission: RE | Admit: 2013-10-27 | Discharge: 2013-10-27 | Disposition: A | Discharge status: Home / Self Care | Payer: Self-pay | Source: Ambulatory Visit | Attending: Internal Medicine | Admitting: Internal Medicine

## 2013-10-27 DIAGNOSIS — Z9861 Coronary angioplasty status: Secondary | ICD-10-CM | POA: Insufficient documentation

## 2013-10-27 DIAGNOSIS — I2510 Atherosclerotic heart disease of native coronary artery without angina pectoris: Secondary | ICD-10-CM | POA: Insufficient documentation

## 2013-10-27 DIAGNOSIS — Z5189 Encounter for other specified aftercare: Secondary | ICD-10-CM | POA: Insufficient documentation

## 2013-10-29 ENCOUNTER — Encounter (HOSPITAL_COMMUNITY)
Admission: RE | Admit: 2013-10-29 | Discharge: 2013-10-29 | Disposition: A | Discharge status: Home / Self Care | Payer: Self-pay | Source: Ambulatory Visit | Attending: Internal Medicine | Admitting: Internal Medicine

## 2013-10-31 ENCOUNTER — Encounter (HOSPITAL_COMMUNITY)
Admission: RE | Admit: 2013-10-31 | Discharge: 2013-10-31 | Disposition: A | Discharge status: Home / Self Care | Payer: Self-pay | Source: Ambulatory Visit | Attending: Internal Medicine | Admitting: Internal Medicine

## 2013-11-03 ENCOUNTER — Encounter (HOSPITAL_COMMUNITY)
Admission: RE | Admit: 2013-11-03 | Discharge: 2013-11-03 | Disposition: A | Discharge status: Home / Self Care | Payer: Medicare Other | Source: Ambulatory Visit | Attending: Internal Medicine | Admitting: Internal Medicine

## 2013-11-05 ENCOUNTER — Encounter (HOSPITAL_COMMUNITY)
Admission: RE | Admit: 2013-11-05 | Discharge: 2013-11-05 | Disposition: A | Discharge status: Home / Self Care | Payer: Self-pay | Source: Ambulatory Visit | Attending: Internal Medicine | Admitting: Internal Medicine

## 2013-11-07 ENCOUNTER — Encounter (HOSPITAL_COMMUNITY)
Admission: RE | Admit: 2013-11-07 | Discharge: 2013-11-07 | Disposition: A | Discharge status: Home / Self Care | Payer: Self-pay | Source: Ambulatory Visit | Attending: Internal Medicine | Admitting: Internal Medicine

## 2013-11-10 ENCOUNTER — Encounter (HOSPITAL_COMMUNITY): Payer: Medicare Other

## 2013-11-12 ENCOUNTER — Encounter (HOSPITAL_COMMUNITY)
Admission: RE | Admit: 2013-11-12 | Discharge: 2013-11-12 | Disposition: A | Discharge status: Home / Self Care | Payer: Self-pay | Source: Ambulatory Visit | Attending: Internal Medicine | Admitting: Internal Medicine

## 2013-11-14 ENCOUNTER — Encounter (HOSPITAL_COMMUNITY)
Admission: RE | Admit: 2013-11-14 | Discharge: 2013-11-14 | Disposition: A | Discharge status: Home / Self Care | Payer: Self-pay | Source: Ambulatory Visit | Attending: Internal Medicine | Admitting: Internal Medicine

## 2013-11-17 ENCOUNTER — Encounter (HOSPITAL_COMMUNITY)
Admission: RE | Admit: 2013-11-17 | Discharge: 2013-11-17 | Disposition: A | Discharge status: Home / Self Care | Payer: Self-pay | Source: Ambulatory Visit | Attending: Internal Medicine | Admitting: Internal Medicine

## 2013-11-17 ENCOUNTER — Encounter (HOSPITAL_COMMUNITY): Payer: Self-pay

## 2013-11-19 ENCOUNTER — Other Ambulatory Visit (HOSPITAL_COMMUNITY): Payer: Self-pay

## 2013-11-19 ENCOUNTER — Encounter (HOSPITAL_COMMUNITY)
Admission: RE | Admit: 2013-11-19 | Discharge: 2013-11-19 | Disposition: A | Discharge status: Home / Self Care | Payer: Self-pay | Source: Ambulatory Visit | Attending: Internal Medicine | Admitting: Internal Medicine

## 2013-11-21 ENCOUNTER — Encounter (HOSPITAL_COMMUNITY)
Admission: RE | Admit: 2013-11-21 | Discharge: 2013-11-21 | Disposition: A | Discharge status: Home / Self Care | Payer: Self-pay | Source: Ambulatory Visit | Attending: Internal Medicine | Admitting: Internal Medicine

## 2013-11-21 ENCOUNTER — Other Ambulatory Visit (HOSPITAL_COMMUNITY): Payer: Self-pay | Admitting: *Deleted

## 2013-11-21 MED ORDER — POTASSIUM CHLORIDE CRYS ER 20 MEQ PO TBCR: 20.0000 meq | EXTENDED_RELEASE_TABLET | Freq: Every day | ORAL | Status: DC

## 2013-11-21 NOTE — Telephone Encounter (Signed)
Received refill request from pharmacy for KCL, per chart KCL was stopped in June 2014 at hospital d/c, called and spoke w/pt he states lasix and KCL were restarted back in the fall by EP and he is taking KCL 20 meq daily, rx sent to pharmacy

## 2013-11-24 ENCOUNTER — Encounter (HOSPITAL_COMMUNITY)
Admission: RE | Admit: 2013-11-24 | Discharge: 2013-11-24 | Disposition: A | Discharge status: Home / Self Care | Payer: Self-pay | Source: Ambulatory Visit | Attending: Internal Medicine | Admitting: Internal Medicine

## 2013-11-26 ENCOUNTER — Encounter (HOSPITAL_COMMUNITY)
Admission: RE | Admit: 2013-11-26 | Discharge: 2013-11-26 | Disposition: A | Discharge status: Home / Self Care | Payer: Self-pay | Source: Ambulatory Visit | Attending: Internal Medicine | Admitting: Internal Medicine

## 2013-11-26 DIAGNOSIS — Z9861 Coronary angioplasty status: Secondary | ICD-10-CM | POA: Insufficient documentation

## 2013-11-26 DIAGNOSIS — I2510 Atherosclerotic heart disease of native coronary artery without angina pectoris: Secondary | ICD-10-CM | POA: Insufficient documentation

## 2013-11-26 DIAGNOSIS — Z5189 Encounter for other specified aftercare: Secondary | ICD-10-CM | POA: Insufficient documentation

## 2013-11-28 ENCOUNTER — Encounter (HOSPITAL_COMMUNITY)
Admission: RE | Admit: 2013-11-28 | Discharge: 2013-11-28 | Disposition: A | Discharge status: Home / Self Care | Payer: Self-pay | Source: Ambulatory Visit | Attending: Internal Medicine | Admitting: Internal Medicine

## 2013-12-01 ENCOUNTER — Encounter (HOSPITAL_COMMUNITY): Payer: Self-pay

## 2013-12-03 ENCOUNTER — Encounter (HOSPITAL_COMMUNITY): Payer: Self-pay

## 2013-12-04 ENCOUNTER — Ambulatory Visit (HOSPITAL_COMMUNITY)
Admission: RE | Admit: 2013-12-04 | Discharge: 2013-12-04 | Disposition: A | Discharge status: Home / Self Care | Payer: Medicare Other | Source: Ambulatory Visit | Attending: Internal Medicine | Admitting: Internal Medicine

## 2013-12-04 VITALS — BP 108/60 | HR 72 | Wt 127.8 lb

## 2013-12-04 DIAGNOSIS — Z954 Presence of other heart-valve replacement: Secondary | ICD-10-CM | POA: Diagnosis not present

## 2013-12-04 DIAGNOSIS — Z952 Presence of prosthetic heart valve: Secondary | ICD-10-CM

## 2013-12-04 DIAGNOSIS — I5032 Chronic diastolic (congestive) heart failure: Secondary | ICD-10-CM | POA: Diagnosis not present

## 2013-12-04 DIAGNOSIS — R609 Edema, unspecified: Secondary | ICD-10-CM | POA: Diagnosis not present

## 2013-12-04 DIAGNOSIS — R600 Localized edema: Secondary | ICD-10-CM

## 2013-12-04 DIAGNOSIS — I359 Nonrheumatic aortic valve disorder, unspecified: Secondary | ICD-10-CM

## 2013-12-04 DIAGNOSIS — I350 Nonrheumatic aortic (valve) stenosis: Secondary | ICD-10-CM

## 2013-12-04 NOTE — Addendum Note (Signed)
Encounter addended by: Scarlette Calico, RN on: 12/04/2013 10:14 AM<BR>     Documentation filed: Patient Instructions Section

## 2013-12-04 NOTE — Progress Notes (Signed)
Patient ID: [name], male   DOB: 10/05/1940, 73 y.o.   MRN: [id number]  PCP: Dr. Tor Netters is 73 y/o male with h/o AS s/p bioprosthetic AVR (01/2013), DM2, CHB (s/p pacemaker) and hyperlipidemia. Bioprosthetic AVR with ligation of diagonal-PA fistula by Dr. Cyndia Bent on 02/17/13. He had post-operative complete heart block. A Medtronic dual chamber pacemaker was placed 02/19/13.   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 Leamon Arnt.     Continues to do well and get stronger  He is not dyspneic walking on flat ground but has mild dyspnea with steps when carrying things.  No chest pain.  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  He does have some mild lower extremity edema but not worse then before surgery. Taking lasix 40/20. Does use SBE prophylaxis. Doing CR maintenance program.   Labs (6/14): Potassium 4.0 Creatinine 1.0   ROS: All systems negative except as listed in HPI, PMH and Problem List.  SH: Married, lives in [place], nonsmoker, works at the [employer].   Past Medical History  Diagnosis Date  . Aortic stenosis     moderate. Echo 3/10: EF 55-60%. mean gradient 25 mmHg. AVA 1.23cm2. Echo 3/11: Normal EF. mean AVA gradient 77mmHg. exercise myoview 2008: positive ECG. normal perfusion.  Bioprosthetic AVR with ligation of diagonal-PA fistula  . Exposure to secondhand smoke     extensive exposure  . Hyperlipidemia     treated  . Depression   . Asthma   . Heart murmur   . LBBB (left bundle branch block)   . Shortness of breath     exertion  . DM2 (diabetes mellitus, type 2)     fasting blood sugar - 90s  . Dizziness   . Testicular cancer     s/p XRT and surgical resection.  . Chronic lymphocytic leukemia     followed by Dr. Ouida Sills  - CHB post-op AoV surgery.   Medtronic dual chamber PCM.   Current Outpatient Prescriptions  Medication Sig Dispense Refill  . acetaminophen (TYLENOL) 500 MG tablet Take 500 mg by mouth 3 (three) times daily.       Marland Kitchen aspirin 81 MG tablet Take 1 tablet (81 mg total) by mouth daily.  30 tablet  11  . atorvastatin (LIPITOR) 40 MG tablet Take 40 mg by mouth daily.      . bifidobacterium infantis (ALIGN) capsule Take 1 capsule by mouth daily.      . busPIRone (BUSPAR) 15 MG tablet Take by mouth. One tablet every morning and two tablets every day at noon      . Cholestyramine POWD Take 1 scoop by mouth 2 (two) times daily.       . cycloSPORINE (RESTASIS) 0.05 % ophthalmic emulsion Place 1 drop into both eyes 2 (two) times daily.       Marland Kitchen desloratadine (CLARINEX) 5 MG tablet Take 5 mg by mouth daily.       . fenofibrate 160 MG tablet Take 160 mg by mouth daily.       . fluticasone (FLONASE) 50 MCG/ACT nasal spray Place 2 sprays into the nose daily.       Marland Kitchen FORA V12 BLOOD GLUCOSE TEST test strip       . furosemide (LASIX) 40 MG tablet Take 40 mg in AM and 20 mg  in PM      . gabapentin (NEURONTIN) 300 MG capsule One capsule in the morning and 2 capsules at noon      . LITETOUCH LANCETS MISC       . Multiple Vitamin (MULTIVITAMIN) tablet Take 1 tablet by mouth daily.       Vladimir Faster Glycol-Propyl Glycol (SYSTANE OP) Place 1 drop into both eyes 2 (two) times daily.      . potassium chloride SA (K-DUR,KLOR-CON) 20 MEQ tablet Take 1 tablet (20 mEq total) by mouth daily.  30 tablet  6  . PROCTOZONE-HC 2.5 % rectal cream Place 1 application rectally 2 (two) times daily as needed.       . sertraline (ZOLOFT) 100 MG tablet Take 100 mg by mouth daily.       . traZODone (DESYREL) 50 MG tablet Take 150 mg by mouth at bedtime.        No current facility-administered medications for this encounter.     PHYSICAL EXAM: Filed Vitals:   12/04/13 0942  BP: 108/60  Pulse: 72  Weight: 127 lb 12 oz (57.947 kg)  SpO2: 100%    General:   well appearing. no resp difficulty; wife present HEENT: normal Neck: supple. JVP 5-6. No carotid bruit.  Cor: PMI nondisplaced. Regular rate & rhythm. 1/6 SEM RUSB.  Lungs: clear Abdomen: soft, nontender, nondistended. No hepatosplenomegaly. No bruits or masses. Good bowel sounds. Extremities: no cyanosis, clubbing, rash, 1-2+ LE edema R>L +varicose veins Neuro: alert & orientedx3, cranial nerves grossly intact. moves all 4 extremities w/o difficulty. affect pleasant  ECG: A sensed v-paced 67  ASSESSMENT & PLAN: 1. Status post AVR: Stable. Doing well. Will need yearly echo in June. He is compliant with SBE prophylaxis 2. Complete heart block: Medtronic PCM.  Followed by Dr. Caryl Comes.  3. Diagonal-PA fistula: Ligated at time of AVR.  4. Lower extremity edema - i think this is mostly dependent edema. Continue lasix at current dose can use more if swelling worsens. Otherwise I think best intervention is to wear compression stockings.  Followup 1 year  Jolaine Artist 12/04/2013

## 2013-12-04 NOTE — Patient Instructions (Signed)
We will contact you in 1 year to schedule your next appointment.  

## 2013-12-05 ENCOUNTER — Encounter (HOSPITAL_COMMUNITY)
Admission: RE | Admit: 2013-12-05 | Discharge: 2013-12-05 | Disposition: A | Discharge status: Home / Self Care | Payer: Self-pay | Source: Ambulatory Visit | Attending: Internal Medicine | Admitting: Internal Medicine

## 2013-12-05 NOTE — Addendum Note (Signed)
Encounter addended by: Evalee Mutton, CCT on: 12/05/2013  8:49 AM<BR>     Documentation filed: Charges VN

## 2013-12-08 ENCOUNTER — Encounter (HOSPITAL_COMMUNITY)
Admission: RE | Admit: 2013-12-08 | Discharge: 2013-12-08 | Disposition: A | Discharge status: Home / Self Care | Payer: Self-pay | Source: Ambulatory Visit | Attending: Internal Medicine | Admitting: Internal Medicine

## 2013-12-10 ENCOUNTER — Encounter (HOSPITAL_COMMUNITY)
Admission: RE | Admit: 2013-12-10 | Discharge: 2013-12-10 | Disposition: A | Discharge status: Home / Self Care | Payer: Self-pay | Source: Ambulatory Visit | Attending: Internal Medicine | Admitting: Internal Medicine

## 2013-12-12 ENCOUNTER — Encounter (HOSPITAL_COMMUNITY)
Admission: RE | Admit: 2013-12-12 | Discharge: 2013-12-12 | Disposition: A | Discharge status: Home / Self Care | Payer: Self-pay | Source: Ambulatory Visit | Attending: Internal Medicine | Admitting: Internal Medicine

## 2013-12-15 ENCOUNTER — Encounter: Payer: Self-pay | Admitting: Internal Medicine

## 2013-12-15 ENCOUNTER — Encounter (HOSPITAL_COMMUNITY)
Admission: RE | Admit: 2013-12-15 | Discharge: 2013-12-15 | Disposition: A | Discharge status: Home / Self Care | Payer: Self-pay | Source: Ambulatory Visit | Attending: Internal Medicine | Admitting: Internal Medicine

## 2013-12-15 ENCOUNTER — Ambulatory Visit (INDEPENDENT_AMBULATORY_CARE_PROVIDER_SITE_OTHER): Payer: Medicare Other | Admitting: *Deleted

## 2013-12-15 DIAGNOSIS — I442 Atrioventricular block, complete: Secondary | ICD-10-CM | POA: Diagnosis not present

## 2013-12-15 LAB — MDC_IDC_ENUM_SESS_TYPE_REMOTE
Battery Voltage: 2.79 V
Brady Statistic AP VP Percent: 1.1 %
Brady Statistic AP VS Percent: 0.1 %
Brady Statistic AS VP Percent: 98.9 %
Brady Statistic AS VS Percent: 0.1 %
Lead Channel Impedance Value: 533 Ohm
Lead Channel Pacing Threshold Amplitude: 0.5 V
Lead Channel Pacing Threshold Amplitude: 0.75 V
Lead Channel Sensing Intrinsic Amplitude: 2.8 mV
Lead Channel Setting Pacing Amplitude: 1.5 V
Lead Channel Setting Pacing Amplitude: 2 V
Lead Channel Setting Pacing Pulse Width: 0.4 ms
MDC IDC MSMT BATTERY REMAINING LONGEVITY: 149 mo
MDC IDC MSMT LEADCHNL RA PACING THRESHOLD PULSEWIDTH: 0.4 ms
MDC IDC MSMT LEADCHNL RV IMPEDANCE VALUE: 576 Ohm
MDC IDC MSMT LEADCHNL RV PACING THRESHOLD PULSEWIDTH: 0.4 ms
MDC IDC SET LEADCHNL RV SENSING SENSITIVITY: 2.8 mV

## 2013-12-17 ENCOUNTER — Encounter (HOSPITAL_COMMUNITY)
Admission: RE | Admit: 2013-12-17 | Discharge: 2013-12-17 | Disposition: A | Discharge status: Home / Self Care | Payer: Self-pay | Source: Ambulatory Visit | Attending: Internal Medicine | Admitting: Internal Medicine

## 2013-12-19 ENCOUNTER — Encounter (HOSPITAL_COMMUNITY)
Admission: RE | Admit: 2013-12-19 | Discharge: 2013-12-19 | Disposition: A | Discharge status: Home / Self Care | Payer: Self-pay | Source: Ambulatory Visit | Attending: Internal Medicine | Admitting: Internal Medicine

## 2013-12-22 ENCOUNTER — Encounter (HOSPITAL_COMMUNITY)
Admission: RE | Admit: 2013-12-22 | Discharge: 2013-12-22 | Disposition: A | Discharge status: Home / Self Care | Payer: Self-pay | Source: Ambulatory Visit | Attending: Internal Medicine | Admitting: Internal Medicine

## 2013-12-24 ENCOUNTER — Encounter (HOSPITAL_COMMUNITY)
Admission: RE | Admit: 2013-12-24 | Discharge: 2013-12-24 | Disposition: A | Discharge status: Home / Self Care | Payer: Self-pay | Source: Ambulatory Visit | Attending: Internal Medicine | Admitting: Internal Medicine

## 2013-12-26 ENCOUNTER — Encounter (HOSPITAL_COMMUNITY)
Admission: RE | Admit: 2013-12-26 | Discharge: 2013-12-26 | Disposition: A | Discharge status: Home / Self Care | Payer: Self-pay | Source: Ambulatory Visit | Attending: Internal Medicine | Admitting: Internal Medicine

## 2013-12-26 DIAGNOSIS — I359 Nonrheumatic aortic valve disorder, unspecified: Secondary | ICD-10-CM | POA: Insufficient documentation

## 2013-12-26 DIAGNOSIS — Z950 Presence of cardiac pacemaker: Secondary | ICD-10-CM | POA: Insufficient documentation

## 2013-12-26 DIAGNOSIS — Z5189 Encounter for other specified aftercare: Secondary | ICD-10-CM | POA: Insufficient documentation

## 2013-12-26 DIAGNOSIS — Z954 Presence of other heart-valve replacement: Secondary | ICD-10-CM | POA: Insufficient documentation

## 2013-12-26 DIAGNOSIS — E785 Hyperlipidemia, unspecified: Secondary | ICD-10-CM | POA: Insufficient documentation

## 2013-12-26 DIAGNOSIS — I5032 Chronic diastolic (congestive) heart failure: Secondary | ICD-10-CM | POA: Insufficient documentation

## 2013-12-26 DIAGNOSIS — I442 Atrioventricular block, complete: Secondary | ICD-10-CM | POA: Insufficient documentation

## 2013-12-29 ENCOUNTER — Encounter (HOSPITAL_COMMUNITY)
Admission: RE | Admit: 2013-12-29 | Discharge: 2013-12-29 | Disposition: A | Discharge status: Home / Self Care | Payer: Self-pay | Source: Ambulatory Visit | Attending: Internal Medicine | Admitting: Internal Medicine

## 2013-12-29 NOTE — Progress Notes (Signed)
PPM remote 

## 2013-12-31 ENCOUNTER — Encounter (HOSPITAL_COMMUNITY)
Admission: RE | Admit: 2013-12-31 | Discharge: 2013-12-31 | Disposition: A | Discharge status: Home / Self Care | Payer: Self-pay | Source: Ambulatory Visit | Attending: Internal Medicine | Admitting: Internal Medicine

## 2013-12-31 ENCOUNTER — Encounter: Payer: Self-pay | Admitting: Cardiology

## 2014-01-02 ENCOUNTER — Encounter (HOSPITAL_COMMUNITY)
Admission: RE | Admit: 2014-01-02 | Discharge: 2014-01-02 | Disposition: A | Discharge status: Home / Self Care | Payer: Self-pay | Source: Ambulatory Visit | Attending: Internal Medicine | Admitting: Internal Medicine

## 2014-01-05 ENCOUNTER — Encounter (HOSPITAL_COMMUNITY)
Admission: RE | Admit: 2014-01-05 | Discharge: 2014-01-05 | Disposition: A | Discharge status: Home / Self Care | Payer: Self-pay | Source: Ambulatory Visit | Attending: Internal Medicine | Admitting: Internal Medicine

## 2014-01-07 ENCOUNTER — Encounter (HOSPITAL_COMMUNITY)
Admission: RE | Admit: 2014-01-07 | Discharge: 2014-01-07 | Disposition: A | Discharge status: Home / Self Care | Payer: Self-pay | Source: Ambulatory Visit | Attending: Internal Medicine | Admitting: Internal Medicine

## 2014-01-09 ENCOUNTER — Encounter (HOSPITAL_COMMUNITY)
Admission: RE | Admit: 2014-01-09 | Discharge: 2014-01-09 | Disposition: A | Discharge status: Home / Self Care | Payer: Self-pay | Source: Ambulatory Visit | Attending: Internal Medicine | Admitting: Internal Medicine

## 2014-01-12 ENCOUNTER — Encounter (HOSPITAL_COMMUNITY)
Admission: RE | Admit: 2014-01-12 | Discharge: 2014-01-12 | Disposition: A | Discharge status: Home / Self Care | Payer: Self-pay | Source: Ambulatory Visit | Attending: Internal Medicine | Admitting: Internal Medicine

## 2014-01-14 ENCOUNTER — Encounter (HOSPITAL_COMMUNITY)
Admission: RE | Admit: 2014-01-14 | Discharge: 2014-01-14 | Disposition: A | Discharge status: Home / Self Care | Payer: Self-pay | Source: Ambulatory Visit | Attending: Internal Medicine | Admitting: Internal Medicine

## 2014-01-16 ENCOUNTER — Encounter: Payer: Self-pay | Admitting: Internal Medicine

## 2014-01-16 ENCOUNTER — Encounter (HOSPITAL_COMMUNITY)
Admission: RE | Admit: 2014-01-16 | Discharge: 2014-01-16 | Disposition: A | Discharge status: Home / Self Care | Payer: Self-pay | Source: Ambulatory Visit | Attending: Internal Medicine | Admitting: Internal Medicine

## 2014-01-21 ENCOUNTER — Encounter (HOSPITAL_COMMUNITY)
Admission: RE | Admit: 2014-01-21 | Discharge: 2014-01-21 | Disposition: A | Discharge status: Home / Self Care | Payer: Self-pay | Source: Ambulatory Visit | Attending: Internal Medicine | Admitting: Internal Medicine

## 2014-01-22 ENCOUNTER — Other Ambulatory Visit (HOSPITAL_COMMUNITY): Payer: Self-pay | Admitting: Cardiology

## 2014-01-22 DIAGNOSIS — I509 Heart failure, unspecified: Secondary | ICD-10-CM

## 2014-01-22 MED ORDER — FUROSEMIDE 40 MG PO TABS: ORAL_TABLET | ORAL | Status: DC

## 2014-01-23 ENCOUNTER — Other Ambulatory Visit (HOSPITAL_COMMUNITY): Payer: Self-pay | Admitting: *Deleted

## 2014-01-23 ENCOUNTER — Encounter (HOSPITAL_COMMUNITY)
Admission: RE | Admit: 2014-01-23 | Discharge: 2014-01-23 | Disposition: A | Discharge status: Home / Self Care | Payer: Self-pay | Source: Ambulatory Visit | Attending: Internal Medicine | Admitting: Internal Medicine

## 2014-01-23 DIAGNOSIS — I509 Heart failure, unspecified: Secondary | ICD-10-CM

## 2014-01-23 MED ORDER — FUROSEMIDE 40 MG PO TABS: ORAL_TABLET | ORAL | Status: DC

## 2014-01-26 ENCOUNTER — Encounter (HOSPITAL_COMMUNITY)
Admission: RE | Admit: 2014-01-26 | Discharge: 2014-01-26 | Disposition: A | Discharge status: Home / Self Care | Payer: Self-pay | Source: Ambulatory Visit | Attending: Internal Medicine | Admitting: Internal Medicine

## 2014-01-26 DIAGNOSIS — Z5189 Encounter for other specified aftercare: Secondary | ICD-10-CM | POA: Insufficient documentation

## 2014-01-26 DIAGNOSIS — I5032 Chronic diastolic (congestive) heart failure: Secondary | ICD-10-CM | POA: Insufficient documentation

## 2014-01-26 DIAGNOSIS — Z950 Presence of cardiac pacemaker: Secondary | ICD-10-CM | POA: Insufficient documentation

## 2014-01-26 DIAGNOSIS — Z954 Presence of other heart-valve replacement: Secondary | ICD-10-CM | POA: Insufficient documentation

## 2014-01-26 DIAGNOSIS — E785 Hyperlipidemia, unspecified: Secondary | ICD-10-CM | POA: Insufficient documentation

## 2014-01-28 ENCOUNTER — Encounter (HOSPITAL_COMMUNITY)
Admission: RE | Admit: 2014-01-28 | Discharge: 2014-01-28 | Disposition: A | Discharge status: Home / Self Care | Payer: Self-pay | Source: Ambulatory Visit | Attending: Internal Medicine | Admitting: Internal Medicine

## 2014-01-30 ENCOUNTER — Encounter (HOSPITAL_COMMUNITY)
Admission: RE | Admit: 2014-01-30 | Discharge: 2014-01-30 | Disposition: A | Discharge status: Home / Self Care | Payer: Self-pay | Source: Ambulatory Visit | Attending: Internal Medicine | Admitting: Internal Medicine

## 2014-02-02 ENCOUNTER — Encounter (HOSPITAL_COMMUNITY)
Admission: RE | Admit: 2014-02-02 | Discharge: 2014-02-02 | Disposition: A | Discharge status: Home / Self Care | Payer: Self-pay | Source: Ambulatory Visit | Attending: Internal Medicine | Admitting: Internal Medicine

## 2014-02-04 ENCOUNTER — Encounter (HOSPITAL_COMMUNITY)
Admission: RE | Admit: 2014-02-04 | Discharge: 2014-02-04 | Disposition: A | Discharge status: Home / Self Care | Payer: Self-pay | Source: Ambulatory Visit | Attending: Internal Medicine | Admitting: Internal Medicine

## 2014-02-06 ENCOUNTER — Encounter (HOSPITAL_COMMUNITY)
Admission: RE | Admit: 2014-02-06 | Discharge: 2014-02-06 | Disposition: A | Discharge status: Home / Self Care | Payer: Self-pay | Source: Ambulatory Visit | Attending: Internal Medicine | Admitting: Internal Medicine

## 2014-02-09 ENCOUNTER — Encounter (HOSPITAL_COMMUNITY)
Admission: RE | Admit: 2014-02-09 | Discharge: 2014-02-09 | Disposition: A | Discharge status: Home / Self Care | Payer: Self-pay | Source: Ambulatory Visit | Attending: Internal Medicine | Admitting: Internal Medicine

## 2014-02-11 ENCOUNTER — Encounter (HOSPITAL_COMMUNITY): Payer: Self-pay

## 2014-02-13 ENCOUNTER — Encounter (HOSPITAL_COMMUNITY)
Admission: RE | Admit: 2014-02-13 | Discharge: 2014-02-13 | Disposition: A | Discharge status: Home / Self Care | Payer: Self-pay | Source: Ambulatory Visit | Attending: Internal Medicine | Admitting: Internal Medicine

## 2014-02-16 ENCOUNTER — Encounter (HOSPITAL_COMMUNITY)
Admission: RE | Admit: 2014-02-16 | Discharge: 2014-02-16 | Disposition: A | Discharge status: Home / Self Care | Payer: Self-pay | Source: Ambulatory Visit | Attending: Internal Medicine | Admitting: Internal Medicine

## 2014-02-17 DIAGNOSIS — N182 Chronic kidney disease, stage 2 (mild): Secondary | ICD-10-CM | POA: Diagnosis not present

## 2014-02-17 DIAGNOSIS — E782 Mixed hyperlipidemia: Secondary | ICD-10-CM | POA: Diagnosis not present

## 2014-02-17 DIAGNOSIS — Z79899 Other long term (current) drug therapy: Secondary | ICD-10-CM | POA: Diagnosis not present

## 2014-02-17 DIAGNOSIS — I359 Nonrheumatic aortic valve disorder, unspecified: Secondary | ICD-10-CM | POA: Diagnosis not present

## 2014-02-17 DIAGNOSIS — E1129 Type 2 diabetes mellitus with other diabetic kidney complication: Secondary | ICD-10-CM | POA: Diagnosis not present

## 2014-02-18 ENCOUNTER — Encounter (HOSPITAL_COMMUNITY): Payer: Self-pay

## 2014-02-20 ENCOUNTER — Encounter (HOSPITAL_COMMUNITY): Payer: Self-pay

## 2014-02-23 ENCOUNTER — Encounter (HOSPITAL_COMMUNITY)
Admission: RE | Admit: 2014-02-23 | Discharge: 2014-02-23 | Disposition: A | Discharge status: Home / Self Care | Payer: Self-pay | Source: Ambulatory Visit | Attending: Internal Medicine | Admitting: Internal Medicine

## 2014-02-25 ENCOUNTER — Encounter (HOSPITAL_COMMUNITY)
Admission: RE | Admit: 2014-02-25 | Discharge: 2014-02-25 | Disposition: A | Discharge status: Home / Self Care | Payer: Self-pay | Source: Ambulatory Visit | Attending: Internal Medicine | Admitting: Internal Medicine

## 2014-02-25 DIAGNOSIS — Z5189 Encounter for other specified aftercare: Secondary | ICD-10-CM | POA: Insufficient documentation

## 2014-02-25 DIAGNOSIS — E785 Hyperlipidemia, unspecified: Secondary | ICD-10-CM | POA: Insufficient documentation

## 2014-02-25 DIAGNOSIS — Z950 Presence of cardiac pacemaker: Secondary | ICD-10-CM | POA: Insufficient documentation

## 2014-02-25 DIAGNOSIS — Z954 Presence of other heart-valve replacement: Secondary | ICD-10-CM | POA: Insufficient documentation

## 2014-02-25 DIAGNOSIS — I5032 Chronic diastolic (congestive) heart failure: Secondary | ICD-10-CM | POA: Insufficient documentation

## 2014-03-02 ENCOUNTER — Encounter (HOSPITAL_COMMUNITY)
Admission: RE | Admit: 2014-03-02 | Discharge: 2014-03-02 | Disposition: A | Discharge status: Home / Self Care | Payer: Self-pay | Source: Ambulatory Visit | Attending: Internal Medicine | Admitting: Internal Medicine

## 2014-03-04 ENCOUNTER — Encounter (HOSPITAL_COMMUNITY)
Admission: RE | Admit: 2014-03-04 | Discharge: 2014-03-04 | Disposition: A | Discharge status: Home / Self Care | Payer: Self-pay | Source: Ambulatory Visit | Attending: Internal Medicine | Admitting: Internal Medicine

## 2014-03-06 ENCOUNTER — Encounter (HOSPITAL_COMMUNITY)
Admission: RE | Admit: 2014-03-06 | Discharge: 2014-03-06 | Disposition: A | Discharge status: Home / Self Care | Payer: Self-pay | Source: Ambulatory Visit | Attending: Internal Medicine | Admitting: Internal Medicine

## 2014-03-09 ENCOUNTER — Encounter (HOSPITAL_COMMUNITY)
Admission: RE | Admit: 2014-03-09 | Discharge: 2014-03-09 | Disposition: A | Discharge status: Home / Self Care | Payer: Self-pay | Source: Ambulatory Visit | Attending: Internal Medicine | Admitting: Internal Medicine

## 2014-03-11 ENCOUNTER — Encounter (HOSPITAL_COMMUNITY)
Admission: RE | Admit: 2014-03-11 | Discharge: 2014-03-11 | Disposition: A | Discharge status: Home / Self Care | Payer: Self-pay | Source: Ambulatory Visit | Attending: Internal Medicine | Admitting: Internal Medicine

## 2014-03-13 ENCOUNTER — Encounter (HOSPITAL_COMMUNITY)
Admission: RE | Admit: 2014-03-13 | Discharge: 2014-03-13 | Disposition: A | Discharge status: Home / Self Care | Payer: Self-pay | Source: Ambulatory Visit | Attending: Internal Medicine | Admitting: Internal Medicine

## 2014-03-16 ENCOUNTER — Encounter (HOSPITAL_COMMUNITY)
Admission: RE | Admit: 2014-03-16 | Discharge: 2014-03-16 | Disposition: A | Discharge status: Home / Self Care | Payer: Self-pay | Source: Ambulatory Visit | Attending: Internal Medicine | Admitting: Internal Medicine

## 2014-03-18 ENCOUNTER — Encounter (HOSPITAL_COMMUNITY)
Admission: RE | Admit: 2014-03-18 | Discharge: 2014-03-18 | Disposition: A | Discharge status: Home / Self Care | Payer: Self-pay | Source: Ambulatory Visit | Attending: Internal Medicine | Admitting: Internal Medicine

## 2014-03-20 ENCOUNTER — Encounter (HOSPITAL_COMMUNITY)
Admission: RE | Admit: 2014-03-20 | Discharge: 2014-03-20 | Disposition: A | Discharge status: Home / Self Care | Payer: Self-pay | Source: Ambulatory Visit | Attending: Internal Medicine | Admitting: Internal Medicine

## 2014-03-23 ENCOUNTER — Encounter (HOSPITAL_COMMUNITY)
Admission: RE | Admit: 2014-03-23 | Discharge: 2014-03-23 | Disposition: A | Discharge status: Home / Self Care | Payer: Self-pay | Source: Ambulatory Visit | Attending: Internal Medicine | Admitting: Internal Medicine

## 2014-03-25 ENCOUNTER — Encounter (HOSPITAL_COMMUNITY)
Admission: RE | Admit: 2014-03-25 | Discharge: 2014-03-25 | Disposition: A | Discharge status: Home / Self Care | Payer: Self-pay | Source: Ambulatory Visit | Attending: Internal Medicine | Admitting: Internal Medicine

## 2014-03-26 ENCOUNTER — Encounter: Payer: Medicare Other | Admitting: Internal Medicine

## 2014-03-27 ENCOUNTER — Encounter (HOSPITAL_COMMUNITY): Payer: Self-pay

## 2014-03-27 ENCOUNTER — Encounter: Payer: Self-pay | Admitting: Internal Medicine

## 2014-03-27 ENCOUNTER — Ambulatory Visit (INDEPENDENT_AMBULATORY_CARE_PROVIDER_SITE_OTHER): Payer: Medicare Other | Admitting: Internal Medicine

## 2014-03-27 VITALS — BP 107/59 | HR 63 | Ht 64.0 in | Wt 126.0 lb

## 2014-03-27 DIAGNOSIS — Z950 Presence of cardiac pacemaker: Secondary | ICD-10-CM

## 2014-03-27 DIAGNOSIS — I442 Atrioventricular block, complete: Secondary | ICD-10-CM | POA: Diagnosis not present

## 2014-03-27 LAB — MDC_IDC_ENUM_SESS_TYPE_INCLINIC
Brady Statistic AP VP Percent: 2 %
Brady Statistic AP VS Percent: 0 %
Brady Statistic AS VP Percent: 98 %
Brady Statistic AS VS Percent: 0 %
Date Time Interrogation Session: 20150731112833
Lead Channel Impedance Value: 510 Ohm
Lead Channel Impedance Value: 562 Ohm
Lead Channel Pacing Threshold Amplitude: 0.5 V
Lead Channel Pacing Threshold Pulse Width: 0.4 ms
Lead Channel Sensing Intrinsic Amplitude: 4 mV
Lead Channel Setting Pacing Amplitude: 1.5 V
MDC IDC MSMT BATTERY IMPEDANCE: 109 Ohm
MDC IDC MSMT BATTERY REMAINING LONGEVITY: 152 mo
MDC IDC MSMT BATTERY VOLTAGE: 2.79 V
MDC IDC MSMT LEADCHNL RA PACING THRESHOLD AMPLITUDE: 0.75 V
MDC IDC MSMT LEADCHNL RV PACING THRESHOLD PULSEWIDTH: 0.4 ms
MDC IDC SET LEADCHNL RV PACING AMPLITUDE: 2 V
MDC IDC SET LEADCHNL RV PACING PULSEWIDTH: 0.4 ms
MDC IDC SET LEADCHNL RV SENSING SENSITIVITY: 2 mV

## 2014-03-27 NOTE — Progress Notes (Signed)
Patient Care Team: Lilian Coma, MD as PCP - General (Family Medicine) Alessandra Grout, MD as Referring Physician (Family Medicine) Jolaine Artist, MD as Attending Physician (Cardiology) Leslie Andrea, MD as Referring Physician (Internal Medicine)   HPI  [name] is a 73 y.o. male Seen for pacemaker followup.  Underwent PPM implantation 6/14 for CHB following AVR  Ejection fraction postoperatively an ejection fraction 55-60%  He has an asymmetric edema right greater than left. She takes diuretics daily. He has no significant CP or sob  He is a concern about a palpable deformation related to his pacemaker incision  Past Medical History  Diagnosis Date  . Aortic stenosis     s/p AVR 6/14  . Exposure to secondhand smoke     extensive exposure  . Hyperlipidemia     treated  . Depression   . Asthma   . Heart murmur   . LBBB (left bundle branch block)   . Shortness of breath     exertion  . DM2 (diabetes mellitus, type 2)     fasting blood sugar - 90s  . Dizziness   . Testicular cancer     s/p XRT and surgical resection.  . Chronic lymphocytic leukemia     followed by Dr. Ouida Sills  . Atrioventricular block, complete   . Pacemaker     Past Surgical History  Procedure Laterality Date  . Xrt and surgical resection      s/p. 10 years ago  . Stapendectomy  07/30/02    right  . Inner ear surgery    . Cyst removal neck    . Aortic valve replacement N/A 02/17/2013    Procedure: AORTIC VALVE REPLACEMENT (AVR);  Surgeon: Gaye Pollack, MD;  Location: [place];  Service: Open Heart Surgery;  Laterality: N/A;  . Intraoperative transesophageal echocardiogram N/A 02/17/2013    Procedure: INTRAOPERATIVE TRANSESOPHAGEAL ECHOCARDIOGRAM;  Surgeon: Gaye Pollack, MD;  Location: [facility] OR;  Service: Open Heart Surgery;  Laterality: N/A;    Current Outpatient Prescriptions  Medication Sig Dispense Refill  . acetaminophen (TYLENOL) 500 MG tablet Take 500 mg by mouth  3 (three) times daily.       Marland Kitchen aspirin 81 MG tablet Take 1 tablet (81 mg total) by mouth daily.  30 tablet  11  . atorvastatin (LIPITOR) 40 MG tablet Take 40 mg by mouth daily.      . bifidobacterium infantis (ALIGN) capsule Take 1 capsule by mouth daily.      . busPIRone (BUSPAR) 15 MG tablet Take by mouth. One tablet every morning and two tablets every day at noon      . Cholestyramine POWD Take 1 scoop by mouth 2 (two) times daily.       . cycloSPORINE (RESTASIS) 0.05 % ophthalmic emulsion Place 1 drop into both eyes 2 (two) times daily.       Marland Kitchen desloratadine (CLARINEX) 5 MG tablet Take 5 mg by mouth daily.       . fenofibrate 160 MG tablet Take 160 mg by mouth daily.       . fluticasone (FLONASE) 50 MCG/ACT nasal spray Place 2 sprays into the nose daily.       Marland Kitchen FORA V12 BLOOD GLUCOSE TEST test strip       . furosemide (LASIX) 40 MG tablet Take 40 mg in AM and 20 mg in PM, may take additional 20 mg in the PM as needed for fluid  60 tablet  3  . gabapentin (NEURONTIN) 300 MG capsule One capsule in the morning and 2 capsules at noon      . LITETOUCH LANCETS MISC       . Multiple Vitamin (MULTIVITAMIN) tablet Take 1 tablet by mouth daily.       Vladimir Faster Glycol-Propyl Glycol (SYSTANE OP) Place 1 drop into both eyes 2 (two) times daily.      . potassium chloride SA (K-DUR,KLOR-CON) 20 MEQ tablet Take 1 tablet (20 mEq total) by mouth daily.  30 tablet  6  . PROCTOZONE-HC 2.5 % rectal cream Place 1 application rectally 2 (two) times daily as needed.       . sertraline (ZOLOFT) 100 MG tablet Take 100 mg by mouth daily.       . traZODone (DESYREL) 50 MG tablet Take 150 mg by mouth at bedtime.        No current facility-administered medications for this visit.    Allergies  Allergen Reactions  . Banana Anaphylaxis  . Penicillins Rash    Review of Systems negative except from HPI and PMH  Physical Exam BP 107/59  Pulse 63  Ht 5\' 4"  (1.626 m)  Wt 126 lb (57.153 kg)  BMI 21.62  kg/m2  Well developed and well nourished in no acute distress HENT normal E scleral and icterus clear Neck Supple JVP flat; carotids brisk and full Clear to ausculation  Device pocket well healed; without hematoma or erythema.  There is no tethering  regular rate and rhythm, early systolic murmur Soft with active bowel sounds No clubbing cyanosis 2>1  Edema Alert and oriented, grossly normal motor and sensory function Skin Warm and Dry  ECG demonstrates P. synchronous pacing Assessment and  Plan     Complete heart block  Pacemaker -Medtronic  Asymmetric edema Device function is normal. He reviewed the x-ray which highlighted at the deformation was a sewing collars.

## 2014-03-27 NOTE — Patient Instructions (Signed)

## 2014-03-30 ENCOUNTER — Encounter (HOSPITAL_COMMUNITY)
Admission: RE | Admit: 2014-03-30 | Discharge: 2014-03-30 | Disposition: A | Discharge status: Home / Self Care | Payer: Self-pay | Source: Ambulatory Visit | Attending: Internal Medicine | Admitting: Internal Medicine

## 2014-03-30 DIAGNOSIS — Z950 Presence of cardiac pacemaker: Secondary | ICD-10-CM | POA: Insufficient documentation

## 2014-03-30 DIAGNOSIS — Z954 Presence of other heart-valve replacement: Secondary | ICD-10-CM | POA: Insufficient documentation

## 2014-03-30 DIAGNOSIS — I5032 Chronic diastolic (congestive) heart failure: Secondary | ICD-10-CM | POA: Insufficient documentation

## 2014-03-30 DIAGNOSIS — E785 Hyperlipidemia, unspecified: Secondary | ICD-10-CM | POA: Insufficient documentation

## 2014-03-30 DIAGNOSIS — Z5189 Encounter for other specified aftercare: Secondary | ICD-10-CM | POA: Insufficient documentation

## 2014-04-01 ENCOUNTER — Encounter (HOSPITAL_COMMUNITY)
Admission: RE | Admit: 2014-04-01 | Discharge: 2014-04-01 | Disposition: A | Discharge status: Home / Self Care | Payer: Self-pay | Source: Ambulatory Visit | Attending: Internal Medicine | Admitting: Internal Medicine

## 2014-04-02 DIAGNOSIS — N400 Enlarged prostate without lower urinary tract symptoms: Secondary | ICD-10-CM | POA: Diagnosis not present

## 2014-04-02 DIAGNOSIS — R972 Elevated prostate specific antigen [PSA]: Secondary | ICD-10-CM | POA: Diagnosis not present

## 2014-04-03 ENCOUNTER — Encounter (HOSPITAL_COMMUNITY): Payer: Self-pay

## 2014-04-06 ENCOUNTER — Encounter (HOSPITAL_COMMUNITY)
Admission: RE | Admit: 2014-04-06 | Discharge: 2014-04-06 | Disposition: A | Discharge status: Home / Self Care | Payer: Self-pay | Source: Ambulatory Visit | Attending: Internal Medicine | Admitting: Internal Medicine

## 2014-04-08 ENCOUNTER — Encounter (HOSPITAL_COMMUNITY)
Admission: RE | Admit: 2014-04-08 | Discharge: 2014-04-08 | Disposition: A | Discharge status: Home / Self Care | Payer: Self-pay | Source: Ambulatory Visit | Attending: Internal Medicine | Admitting: Internal Medicine

## 2014-04-10 ENCOUNTER — Encounter (HOSPITAL_COMMUNITY)
Admission: RE | Admit: 2014-04-10 | Discharge: 2014-04-10 | Disposition: A | Discharge status: Home / Self Care | Payer: Self-pay | Source: Ambulatory Visit | Attending: Internal Medicine | Admitting: Internal Medicine

## 2014-04-13 ENCOUNTER — Encounter (HOSPITAL_COMMUNITY)
Admission: RE | Admit: 2014-04-13 | Discharge: 2014-04-13 | Disposition: A | Discharge status: Home / Self Care | Payer: Self-pay | Source: Ambulatory Visit | Attending: Internal Medicine | Admitting: Internal Medicine

## 2014-04-15 ENCOUNTER — Encounter (HOSPITAL_COMMUNITY)
Admission: RE | Admit: 2014-04-15 | Discharge: 2014-04-15 | Disposition: A | Discharge status: Home / Self Care | Payer: Self-pay | Source: Ambulatory Visit | Attending: Internal Medicine | Admitting: Internal Medicine

## 2014-04-17 ENCOUNTER — Encounter (HOSPITAL_COMMUNITY)
Admission: RE | Admit: 2014-04-17 | Discharge: 2014-04-17 | Disposition: A | Discharge status: Home / Self Care | Payer: Self-pay | Source: Ambulatory Visit | Attending: Internal Medicine | Admitting: Internal Medicine

## 2014-04-20 ENCOUNTER — Encounter (HOSPITAL_COMMUNITY)
Admission: RE | Admit: 2014-04-20 | Discharge: 2014-04-20 | Disposition: A | Discharge status: Home / Self Care | Payer: Self-pay | Source: Ambulatory Visit | Attending: Internal Medicine | Admitting: Internal Medicine

## 2014-04-22 ENCOUNTER — Encounter (HOSPITAL_COMMUNITY)
Admission: RE | Admit: 2014-04-22 | Discharge: 2014-04-22 | Disposition: A | Discharge status: Home / Self Care | Payer: Self-pay | Source: Ambulatory Visit | Attending: Internal Medicine | Admitting: Internal Medicine

## 2014-04-24 ENCOUNTER — Encounter (HOSPITAL_COMMUNITY): Payer: Self-pay

## 2014-04-27 ENCOUNTER — Encounter (HOSPITAL_COMMUNITY): Payer: Self-pay

## 2014-04-29 ENCOUNTER — Encounter (HOSPITAL_COMMUNITY)
Admission: RE | Admit: 2014-04-29 | Discharge: 2014-04-29 | Disposition: A | Discharge status: Home / Self Care | Payer: Self-pay | Source: Ambulatory Visit | Attending: Internal Medicine | Admitting: Internal Medicine

## 2014-04-29 DIAGNOSIS — E785 Hyperlipidemia, unspecified: Secondary | ICD-10-CM | POA: Insufficient documentation

## 2014-04-29 DIAGNOSIS — Z954 Presence of other heart-valve replacement: Secondary | ICD-10-CM | POA: Insufficient documentation

## 2014-04-29 DIAGNOSIS — I5032 Chronic diastolic (congestive) heart failure: Secondary | ICD-10-CM | POA: Insufficient documentation

## 2014-04-29 DIAGNOSIS — Z950 Presence of cardiac pacemaker: Secondary | ICD-10-CM | POA: Insufficient documentation

## 2014-04-29 DIAGNOSIS — Z5189 Encounter for other specified aftercare: Secondary | ICD-10-CM | POA: Insufficient documentation

## 2014-04-30 ENCOUNTER — Other Ambulatory Visit: Payer: Self-pay | Admitting: Dermatology

## 2014-04-30 DIAGNOSIS — C44519 Basal cell carcinoma of skin of other part of trunk: Secondary | ICD-10-CM | POA: Diagnosis not present

## 2014-04-30 DIAGNOSIS — C44611 Basal cell carcinoma of skin of unspecified upper limb, including shoulder: Secondary | ICD-10-CM | POA: Diagnosis not present

## 2014-04-30 DIAGNOSIS — L570 Actinic keratosis: Secondary | ICD-10-CM | POA: Diagnosis not present

## 2014-04-30 DIAGNOSIS — L821 Other seborrheic keratosis: Secondary | ICD-10-CM | POA: Diagnosis not present

## 2014-04-30 DIAGNOSIS — D239 Other benign neoplasm of skin, unspecified: Secondary | ICD-10-CM | POA: Diagnosis not present

## 2014-04-30 DIAGNOSIS — D485 Neoplasm of uncertain behavior of skin: Secondary | ICD-10-CM | POA: Diagnosis not present

## 2014-04-30 DIAGNOSIS — D1801 Hemangioma of skin and subcutaneous tissue: Secondary | ICD-10-CM | POA: Diagnosis not present

## 2014-04-30 DIAGNOSIS — D0460 Carcinoma in situ of skin of unspecified upper limb, including shoulder: Secondary | ICD-10-CM | POA: Diagnosis not present

## 2014-05-01 ENCOUNTER — Encounter (HOSPITAL_COMMUNITY)
Admission: RE | Admit: 2014-05-01 | Discharge: 2014-05-01 | Disposition: A | Discharge status: Home / Self Care | Payer: Self-pay | Source: Ambulatory Visit | Attending: Internal Medicine | Admitting: Internal Medicine

## 2014-05-05 ENCOUNTER — Other Ambulatory Visit (HOSPITAL_COMMUNITY): Payer: Self-pay | Admitting: Internal Medicine

## 2014-05-05 DIAGNOSIS — I509 Heart failure, unspecified: Secondary | ICD-10-CM

## 2014-05-06 ENCOUNTER — Encounter (HOSPITAL_COMMUNITY)
Admission: RE | Admit: 2014-05-06 | Discharge: 2014-05-06 | Disposition: A | Discharge status: Home / Self Care | Payer: Self-pay | Source: Ambulatory Visit | Attending: Internal Medicine | Admitting: Internal Medicine

## 2014-05-08 ENCOUNTER — Encounter (HOSPITAL_COMMUNITY)
Admission: RE | Admit: 2014-05-08 | Discharge: 2014-05-08 | Disposition: A | Discharge status: Home / Self Care | Payer: Self-pay | Source: Ambulatory Visit | Attending: Internal Medicine | Admitting: Internal Medicine

## 2014-05-11 ENCOUNTER — Encounter (HOSPITAL_COMMUNITY)
Admission: RE | Admit: 2014-05-11 | Discharge: 2014-05-11 | Disposition: A | Discharge status: Home / Self Care | Payer: Self-pay | Source: Ambulatory Visit | Attending: Internal Medicine | Admitting: Internal Medicine

## 2014-05-13 ENCOUNTER — Encounter (HOSPITAL_COMMUNITY)
Admission: RE | Admit: 2014-05-13 | Discharge: 2014-05-13 | Disposition: A | Discharge status: Home / Self Care | Payer: Self-pay | Source: Ambulatory Visit | Attending: Internal Medicine | Admitting: Internal Medicine

## 2014-05-14 ENCOUNTER — Other Ambulatory Visit: Payer: Self-pay | Admitting: Internal Medicine

## 2014-05-14 DIAGNOSIS — C7A098 Malignant carcinoid tumors of other sites: Secondary | ICD-10-CM

## 2014-05-14 DIAGNOSIS — K668 Other specified disorders of peritoneum: Secondary | ICD-10-CM

## 2014-05-14 DIAGNOSIS — D0460 Carcinoma in situ of skin of unspecified upper limb, including shoulder: Secondary | ICD-10-CM | POA: Diagnosis not present

## 2014-05-15 ENCOUNTER — Encounter (HOSPITAL_COMMUNITY)
Admission: RE | Admit: 2014-05-15 | Discharge: 2014-05-15 | Disposition: A | Discharge status: Home / Self Care | Payer: Self-pay | Source: Ambulatory Visit | Attending: Internal Medicine | Admitting: Internal Medicine

## 2014-05-18 ENCOUNTER — Encounter (HOSPITAL_COMMUNITY)
Admission: RE | Admit: 2014-05-18 | Discharge: 2014-05-18 | Disposition: A | Discharge status: Home / Self Care | Payer: Self-pay | Source: Ambulatory Visit | Attending: Internal Medicine | Admitting: Internal Medicine

## 2014-05-20 ENCOUNTER — Encounter (HOSPITAL_COMMUNITY)
Admission: RE | Admit: 2014-05-20 | Discharge: 2014-05-20 | Disposition: A | Discharge status: Home / Self Care | Payer: Self-pay | Source: Ambulatory Visit | Attending: Internal Medicine | Admitting: Internal Medicine

## 2014-05-22 ENCOUNTER — Encounter (HOSPITAL_COMMUNITY): Payer: Self-pay

## 2014-05-25 ENCOUNTER — Encounter (HOSPITAL_COMMUNITY)
Admission: RE | Admit: 2014-05-25 | Discharge: 2014-05-25 | Disposition: A | Discharge status: Home / Self Care | Payer: Self-pay | Source: Ambulatory Visit | Attending: Internal Medicine | Admitting: Internal Medicine

## 2014-05-27 ENCOUNTER — Encounter (HOSPITAL_COMMUNITY)
Admission: RE | Admit: 2014-05-27 | Discharge: 2014-05-27 | Disposition: A | Discharge status: Home / Self Care | Payer: Self-pay | Source: Ambulatory Visit | Attending: Internal Medicine | Admitting: Internal Medicine

## 2014-05-29 ENCOUNTER — Encounter (HOSPITAL_COMMUNITY)
Admission: RE | Admit: 2014-05-29 | Discharge: 2014-05-29 | Disposition: A | Discharge status: Home / Self Care | Payer: Self-pay | Source: Ambulatory Visit | Attending: Internal Medicine | Admitting: Internal Medicine

## 2014-05-29 DIAGNOSIS — Z5189 Encounter for other specified aftercare: Secondary | ICD-10-CM | POA: Insufficient documentation

## 2014-05-29 DIAGNOSIS — E785 Hyperlipidemia, unspecified: Secondary | ICD-10-CM | POA: Insufficient documentation

## 2014-05-29 DIAGNOSIS — I5032 Chronic diastolic (congestive) heart failure: Secondary | ICD-10-CM | POA: Insufficient documentation

## 2014-05-29 DIAGNOSIS — Z952 Presence of prosthetic heart valve: Secondary | ICD-10-CM | POA: Insufficient documentation

## 2014-05-29 DIAGNOSIS — Z950 Presence of cardiac pacemaker: Secondary | ICD-10-CM | POA: Insufficient documentation

## 2014-06-01 ENCOUNTER — Encounter (HOSPITAL_COMMUNITY)
Admission: RE | Admit: 2014-06-01 | Discharge: 2014-06-01 | Disposition: A | Discharge status: Home / Self Care | Payer: Self-pay | Source: Ambulatory Visit | Attending: Internal Medicine | Admitting: Internal Medicine

## 2014-06-03 ENCOUNTER — Encounter (HOSPITAL_COMMUNITY)
Admission: RE | Admit: 2014-06-03 | Discharge: 2014-06-03 | Disposition: A | Discharge status: Home / Self Care | Payer: Self-pay | Source: Ambulatory Visit | Attending: Internal Medicine | Admitting: Internal Medicine

## 2014-06-04 DIAGNOSIS — E119 Type 2 diabetes mellitus without complications: Secondary | ICD-10-CM | POA: Diagnosis not present

## 2014-06-04 DIAGNOSIS — H4321 Crystalline deposits in vitreous body, right eye: Secondary | ICD-10-CM | POA: Diagnosis not present

## 2014-06-04 DIAGNOSIS — H2513 Age-related nuclear cataract, bilateral: Secondary | ICD-10-CM | POA: Diagnosis not present

## 2014-06-04 DIAGNOSIS — H04123 Dry eye syndrome of bilateral lacrimal glands: Secondary | ICD-10-CM | POA: Diagnosis not present

## 2014-06-05 ENCOUNTER — Encounter (HOSPITAL_COMMUNITY)
Admission: RE | Admit: 2014-06-05 | Discharge: 2014-06-05 | Disposition: A | Discharge status: Home / Self Care | Payer: Self-pay | Source: Ambulatory Visit | Attending: Internal Medicine | Admitting: Internal Medicine

## 2014-06-08 ENCOUNTER — Encounter (HOSPITAL_COMMUNITY): Payer: Self-pay

## 2014-06-10 ENCOUNTER — Encounter (HOSPITAL_COMMUNITY)
Admission: RE | Admit: 2014-06-10 | Discharge: 2014-06-10 | Disposition: A | Discharge status: Home / Self Care | Payer: Self-pay | Source: Ambulatory Visit | Attending: Internal Medicine | Admitting: Internal Medicine

## 2014-06-12 ENCOUNTER — Encounter (HOSPITAL_COMMUNITY)
Admission: RE | Admit: 2014-06-12 | Discharge: 2014-06-12 | Disposition: A | Discharge status: Home / Self Care | Payer: Self-pay | Source: Ambulatory Visit | Attending: Internal Medicine | Admitting: Internal Medicine

## 2014-06-15 ENCOUNTER — Encounter (HOSPITAL_COMMUNITY)
Admission: RE | Admit: 2014-06-15 | Discharge: 2014-06-15 | Disposition: A | Discharge status: Home / Self Care | Payer: Self-pay | Source: Ambulatory Visit | Attending: Internal Medicine | Admitting: Internal Medicine

## 2014-06-17 ENCOUNTER — Encounter (HOSPITAL_COMMUNITY)
Admission: RE | Admit: 2014-06-17 | Discharge: 2014-06-17 | Disposition: A | Discharge status: Home / Self Care | Payer: Self-pay | Source: Ambulatory Visit | Attending: Internal Medicine | Admitting: Internal Medicine

## 2014-06-19 ENCOUNTER — Encounter (HOSPITAL_COMMUNITY)
Admission: RE | Admit: 2014-06-19 | Discharge: 2014-06-19 | Disposition: A | Discharge status: Home / Self Care | Payer: Self-pay | Source: Ambulatory Visit | Attending: Internal Medicine | Admitting: Internal Medicine

## 2014-06-22 ENCOUNTER — Encounter (HOSPITAL_COMMUNITY): Payer: Self-pay

## 2014-06-24 ENCOUNTER — Other Ambulatory Visit (HOSPITAL_COMMUNITY): Payer: Self-pay | Admitting: Internal Medicine

## 2014-06-24 ENCOUNTER — Encounter (HOSPITAL_COMMUNITY): Payer: Self-pay

## 2014-06-26 ENCOUNTER — Encounter (HOSPITAL_COMMUNITY)
Admission: RE | Admit: 2014-06-26 | Discharge: 2014-06-26 | Disposition: A | Discharge status: Home / Self Care | Payer: Self-pay | Source: Ambulatory Visit | Attending: Internal Medicine | Admitting: Internal Medicine

## 2014-06-29 ENCOUNTER — Encounter (HOSPITAL_COMMUNITY)
Admission: RE | Admit: 2014-06-29 | Discharge: 2014-06-29 | Disposition: A | Discharge status: Home / Self Care | Payer: Self-pay | Source: Ambulatory Visit | Attending: Internal Medicine | Admitting: Internal Medicine

## 2014-06-29 ENCOUNTER — Ambulatory Visit (INDEPENDENT_AMBULATORY_CARE_PROVIDER_SITE_OTHER): Payer: Medicare Other | Admitting: *Deleted

## 2014-06-29 DIAGNOSIS — E785 Hyperlipidemia, unspecified: Secondary | ICD-10-CM | POA: Insufficient documentation

## 2014-06-29 DIAGNOSIS — Z950 Presence of cardiac pacemaker: Secondary | ICD-10-CM | POA: Insufficient documentation

## 2014-06-29 DIAGNOSIS — I442 Atrioventricular block, complete: Secondary | ICD-10-CM | POA: Diagnosis not present

## 2014-06-29 DIAGNOSIS — I5032 Chronic diastolic (congestive) heart failure: Secondary | ICD-10-CM | POA: Insufficient documentation

## 2014-06-29 DIAGNOSIS — Z952 Presence of prosthetic heart valve: Secondary | ICD-10-CM | POA: Insufficient documentation

## 2014-06-29 DIAGNOSIS — Z5189 Encounter for other specified aftercare: Secondary | ICD-10-CM | POA: Insufficient documentation

## 2014-06-29 LAB — MDC_IDC_ENUM_SESS_TYPE_REMOTE
Battery Impedance: 109 Ohm
Battery Remaining Longevity: 152 mo
Battery Voltage: 2.8 V
Brady Statistic AP VP Percent: 9 %
Brady Statistic AS VP Percent: 91 %
Lead Channel Impedance Value: 481 Ohm
Lead Channel Pacing Threshold Amplitude: 0.5 V
Lead Channel Pacing Threshold Amplitude: 0.625 V
Lead Channel Sensing Intrinsic Amplitude: 2.8 mV
Lead Channel Setting Pacing Amplitude: 1.5 V
Lead Channel Setting Pacing Amplitude: 2 V
Lead Channel Setting Pacing Pulse Width: 0.4 ms
MDC IDC MSMT LEADCHNL RA PACING THRESHOLD PULSEWIDTH: 0.4 ms
MDC IDC MSMT LEADCHNL RV IMPEDANCE VALUE: 549 Ohm
MDC IDC MSMT LEADCHNL RV PACING THRESHOLD PULSEWIDTH: 0.4 ms
MDC IDC SESS DTM: 20151102154252
MDC IDC SET LEADCHNL RV SENSING SENSITIVITY: 2 mV
MDC IDC STAT BRADY AP VS PERCENT: 0 %
MDC IDC STAT BRADY AS VS PERCENT: 0 %

## 2014-06-29 NOTE — Progress Notes (Signed)
Remote pacemaker transmission.   

## 2014-07-01 ENCOUNTER — Encounter (HOSPITAL_COMMUNITY): Payer: Self-pay

## 2014-07-03 ENCOUNTER — Encounter (HOSPITAL_COMMUNITY)
Admission: RE | Admit: 2014-07-03 | Discharge: 2014-07-03 | Disposition: A | Discharge status: Home / Self Care | Payer: Self-pay | Source: Ambulatory Visit | Attending: Internal Medicine | Admitting: Internal Medicine

## 2014-07-06 ENCOUNTER — Encounter (HOSPITAL_COMMUNITY)
Admission: RE | Admit: 2014-07-06 | Discharge: 2014-07-06 | Disposition: A | Discharge status: Home / Self Care | Payer: Self-pay | Source: Ambulatory Visit | Attending: Internal Medicine | Admitting: Internal Medicine

## 2014-07-08 ENCOUNTER — Encounter (HOSPITAL_COMMUNITY)
Admission: RE | Admit: 2014-07-08 | Discharge: 2014-07-08 | Disposition: A | Discharge status: Home / Self Care | Payer: Self-pay | Source: Ambulatory Visit | Attending: Internal Medicine | Admitting: Internal Medicine

## 2014-07-09 DIAGNOSIS — Z23 Encounter for immunization: Secondary | ICD-10-CM | POA: Diagnosis not present

## 2014-07-10 ENCOUNTER — Encounter (HOSPITAL_COMMUNITY)
Admission: RE | Admit: 2014-07-10 | Discharge: 2014-07-10 | Disposition: A | Discharge status: Home / Self Care | Payer: Self-pay | Source: Ambulatory Visit | Attending: Internal Medicine | Admitting: Internal Medicine

## 2014-07-13 ENCOUNTER — Encounter (HOSPITAL_COMMUNITY)
Admission: RE | Admit: 2014-07-13 | Discharge: 2014-07-13 | Disposition: A | Discharge status: Home / Self Care | Payer: Self-pay | Source: Ambulatory Visit | Attending: Internal Medicine | Admitting: Internal Medicine

## 2014-07-15 ENCOUNTER — Encounter (HOSPITAL_COMMUNITY)
Admission: RE | Admit: 2014-07-15 | Discharge: 2014-07-15 | Disposition: A | Discharge status: Home / Self Care | Payer: Self-pay | Source: Ambulatory Visit | Attending: Internal Medicine | Admitting: Internal Medicine

## 2014-07-17 ENCOUNTER — Encounter (HOSPITAL_COMMUNITY)
Admission: RE | Admit: 2014-07-17 | Discharge: 2014-07-17 | Disposition: A | Discharge status: Home / Self Care | Payer: Self-pay | Source: Ambulatory Visit | Attending: Internal Medicine | Admitting: Internal Medicine

## 2014-07-20 ENCOUNTER — Encounter (HOSPITAL_COMMUNITY)
Admission: RE | Admit: 2014-07-20 | Discharge: 2014-07-20 | Disposition: A | Discharge status: Home / Self Care | Payer: Self-pay | Source: Ambulatory Visit | Attending: Internal Medicine | Admitting: Internal Medicine

## 2014-07-22 ENCOUNTER — Encounter (HOSPITAL_COMMUNITY): Payer: Self-pay

## 2014-07-27 ENCOUNTER — Encounter (HOSPITAL_COMMUNITY)
Admission: RE | Admit: 2014-07-27 | Discharge: 2014-07-27 | Disposition: A | Discharge status: Home / Self Care | Payer: Self-pay | Source: Ambulatory Visit | Attending: Internal Medicine | Admitting: Internal Medicine

## 2014-07-29 ENCOUNTER — Encounter (HOSPITAL_COMMUNITY)
Admission: RE | Admit: 2014-07-29 | Discharge: 2014-07-29 | Disposition: A | Discharge status: Home / Self Care | Payer: Self-pay | Source: Ambulatory Visit | Attending: Internal Medicine | Admitting: Internal Medicine

## 2014-07-29 DIAGNOSIS — Z952 Presence of prosthetic heart valve: Secondary | ICD-10-CM | POA: Insufficient documentation

## 2014-07-29 DIAGNOSIS — Z5189 Encounter for other specified aftercare: Secondary | ICD-10-CM | POA: Insufficient documentation

## 2014-07-29 DIAGNOSIS — I5032 Chronic diastolic (congestive) heart failure: Secondary | ICD-10-CM | POA: Insufficient documentation

## 2014-07-29 DIAGNOSIS — E785 Hyperlipidemia, unspecified: Secondary | ICD-10-CM | POA: Insufficient documentation

## 2014-07-29 DIAGNOSIS — Z950 Presence of cardiac pacemaker: Secondary | ICD-10-CM | POA: Insufficient documentation

## 2014-07-31 ENCOUNTER — Encounter (HOSPITAL_COMMUNITY): Payer: Self-pay

## 2014-07-31 ENCOUNTER — Ambulatory Visit
Admission: RE | Admit: 2014-07-31 | Discharge: 2014-07-31 | Disposition: A | Discharge status: Home / Self Care | Payer: Medicare Other | Source: Ambulatory Visit | Attending: Internal Medicine | Admitting: Internal Medicine

## 2014-07-31 DIAGNOSIS — C7A019 Malignant carcinoid tumor of the small intestine, unspecified portion: Secondary | ICD-10-CM | POA: Diagnosis not present

## 2014-07-31 DIAGNOSIS — K769 Liver disease, unspecified: Secondary | ICD-10-CM | POA: Diagnosis not present

## 2014-07-31 DIAGNOSIS — N200 Calculus of kidney: Secondary | ICD-10-CM | POA: Diagnosis not present

## 2014-07-31 DIAGNOSIS — K5730 Diverticulosis of large intestine without perforation or abscess without bleeding: Secondary | ICD-10-CM | POA: Diagnosis not present

## 2014-07-31 DIAGNOSIS — C7A098 Malignant carcinoid tumors of other sites: Secondary | ICD-10-CM

## 2014-07-31 DIAGNOSIS — K668 Other specified disorders of peritoneum: Secondary | ICD-10-CM

## 2014-07-31 MED ORDER — IOHEXOL 300 MG/ML  SOLN
100.0000 mL | Freq: Once | INTRAMUSCULAR | Status: AC | PRN
  Administered 2014-07-31: 100 mL via INTRAVENOUS

## 2014-08-03 ENCOUNTER — Encounter (HOSPITAL_COMMUNITY)
Admission: RE | Admit: 2014-08-03 | Discharge: 2014-08-03 | Disposition: A | Discharge status: Home / Self Care | Payer: Self-pay | Source: Ambulatory Visit | Attending: Internal Medicine | Admitting: Internal Medicine

## 2014-08-05 ENCOUNTER — Encounter (HOSPITAL_COMMUNITY)
Admission: RE | Admit: 2014-08-05 | Discharge: 2014-08-05 | Disposition: A | Discharge status: Home / Self Care | Payer: Self-pay | Source: Ambulatory Visit | Attending: Internal Medicine | Admitting: Internal Medicine

## 2014-08-06 ENCOUNTER — Encounter (HOSPITAL_COMMUNITY): Payer: Self-pay | Admitting: Internal Medicine

## 2014-08-07 ENCOUNTER — Encounter (HOSPITAL_COMMUNITY)
Admission: RE | Admit: 2014-08-07 | Discharge: 2014-08-07 | Disposition: A | Discharge status: Home / Self Care | Payer: Self-pay | Source: Ambulatory Visit | Attending: Internal Medicine | Admitting: Internal Medicine

## 2014-08-10 ENCOUNTER — Encounter (HOSPITAL_COMMUNITY)
Admission: RE | Admit: 2014-08-10 | Discharge: 2014-08-10 | Disposition: A | Discharge status: Home / Self Care | Payer: Self-pay | Source: Ambulatory Visit | Attending: Internal Medicine | Admitting: Internal Medicine

## 2014-08-12 ENCOUNTER — Encounter (HOSPITAL_COMMUNITY)
Admission: RE | Admit: 2014-08-12 | Discharge: 2014-08-12 | Disposition: A | Discharge status: Home / Self Care | Payer: Self-pay | Source: Ambulatory Visit | Attending: Internal Medicine | Admitting: Internal Medicine

## 2014-08-14 ENCOUNTER — Encounter (HOSPITAL_COMMUNITY)
Admission: RE | Admit: 2014-08-14 | Discharge: 2014-08-14 | Disposition: A | Discharge status: Home / Self Care | Payer: Self-pay | Source: Ambulatory Visit | Attending: Internal Medicine | Admitting: Internal Medicine

## 2014-08-17 ENCOUNTER — Encounter (HOSPITAL_COMMUNITY)
Admission: RE | Admit: 2014-08-17 | Discharge: 2014-08-17 | Disposition: A | Discharge status: Home / Self Care | Payer: Self-pay | Source: Ambulatory Visit | Attending: Internal Medicine | Admitting: Internal Medicine

## 2014-08-18 DIAGNOSIS — K7689 Other specified diseases of liver: Secondary | ICD-10-CM | POA: Diagnosis not present

## 2014-08-18 DIAGNOSIS — K668 Other specified disorders of peritoneum: Secondary | ICD-10-CM | POA: Diagnosis not present

## 2014-08-19 ENCOUNTER — Encounter (HOSPITAL_COMMUNITY)
Admission: RE | Admit: 2014-08-19 | Discharge: 2014-08-19 | Disposition: A | Discharge status: Home / Self Care | Payer: Self-pay | Source: Ambulatory Visit | Attending: Internal Medicine | Admitting: Internal Medicine

## 2014-08-24 ENCOUNTER — Encounter (HOSPITAL_COMMUNITY)
Admission: RE | Admit: 2014-08-24 | Discharge: 2014-08-24 | Disposition: A | Discharge status: Home / Self Care | Payer: Self-pay | Source: Ambulatory Visit | Attending: Internal Medicine | Admitting: Internal Medicine

## 2014-08-26 ENCOUNTER — Encounter (HOSPITAL_COMMUNITY)
Admission: RE | Admit: 2014-08-26 | Discharge: 2014-08-26 | Disposition: A | Discharge status: Home / Self Care | Payer: Self-pay | Source: Ambulatory Visit | Attending: Internal Medicine | Admitting: Internal Medicine

## 2014-08-31 ENCOUNTER — Encounter (HOSPITAL_COMMUNITY)
Admission: RE | Admit: 2014-08-31 | Discharge: 2014-08-31 | Disposition: A | Discharge status: Home / Self Care | Payer: Self-pay | Source: Ambulatory Visit | Attending: Internal Medicine | Admitting: Internal Medicine

## 2014-08-31 DIAGNOSIS — I5032 Chronic diastolic (congestive) heart failure: Secondary | ICD-10-CM | POA: Insufficient documentation

## 2014-08-31 DIAGNOSIS — E785 Hyperlipidemia, unspecified: Secondary | ICD-10-CM | POA: Insufficient documentation

## 2014-08-31 DIAGNOSIS — Z950 Presence of cardiac pacemaker: Secondary | ICD-10-CM | POA: Insufficient documentation

## 2014-08-31 DIAGNOSIS — Z5189 Encounter for other specified aftercare: Secondary | ICD-10-CM | POA: Insufficient documentation

## 2014-08-31 DIAGNOSIS — Z952 Presence of prosthetic heart valve: Secondary | ICD-10-CM | POA: Insufficient documentation

## 2014-09-02 ENCOUNTER — Encounter (HOSPITAL_COMMUNITY)
Admission: RE | Admit: 2014-09-02 | Discharge: 2014-09-02 | Disposition: A | Discharge status: Home / Self Care | Payer: Self-pay | Source: Ambulatory Visit | Attending: Internal Medicine | Admitting: Internal Medicine

## 2014-09-03 ENCOUNTER — Encounter: Payer: Self-pay | Admitting: Cardiology

## 2014-09-03 DIAGNOSIS — E782 Mixed hyperlipidemia: Secondary | ICD-10-CM | POA: Diagnosis not present

## 2014-09-03 DIAGNOSIS — N400 Enlarged prostate without lower urinary tract symptoms: Secondary | ICD-10-CM | POA: Diagnosis not present

## 2014-09-03 DIAGNOSIS — Z0000 Encounter for general adult medical examination without abnormal findings: Secondary | ICD-10-CM | POA: Diagnosis not present

## 2014-09-03 DIAGNOSIS — N182 Chronic kidney disease, stage 2 (mild): Secondary | ICD-10-CM | POA: Diagnosis not present

## 2014-09-03 DIAGNOSIS — R972 Elevated prostate specific antigen [PSA]: Secondary | ICD-10-CM | POA: Diagnosis not present

## 2014-09-03 DIAGNOSIS — I350 Nonrheumatic aortic (valve) stenosis: Secondary | ICD-10-CM | POA: Diagnosis not present

## 2014-09-03 DIAGNOSIS — Z79899 Other long term (current) drug therapy: Secondary | ICD-10-CM | POA: Diagnosis not present

## 2014-09-03 DIAGNOSIS — D3A098 Benign carcinoid tumors of other sites: Secondary | ICD-10-CM | POA: Diagnosis not present

## 2014-09-03 DIAGNOSIS — E1122 Type 2 diabetes mellitus with diabetic chronic kidney disease: Secondary | ICD-10-CM | POA: Diagnosis not present

## 2014-09-03 DIAGNOSIS — D497 Neoplasm of unspecified behavior of endocrine glands and other parts of nervous system: Secondary | ICD-10-CM | POA: Diagnosis not present

## 2014-09-03 DIAGNOSIS — C9110 Chronic lymphocytic leukemia of B-cell type not having achieved remission: Secondary | ICD-10-CM | POA: Diagnosis not present

## 2014-09-03 DIAGNOSIS — Z23 Encounter for immunization: Secondary | ICD-10-CM | POA: Diagnosis not present

## 2014-09-04 ENCOUNTER — Encounter (HOSPITAL_COMMUNITY)
Admission: RE | Admit: 2014-09-04 | Discharge: 2014-09-04 | Disposition: A | Discharge status: Home / Self Care | Payer: Self-pay | Source: Ambulatory Visit | Attending: Internal Medicine | Admitting: Internal Medicine

## 2014-09-07 ENCOUNTER — Encounter (HOSPITAL_COMMUNITY)
Admission: RE | Admit: 2014-09-07 | Discharge: 2014-09-07 | Disposition: A | Discharge status: Home / Self Care | Payer: Self-pay | Source: Ambulatory Visit | Attending: Internal Medicine | Admitting: Internal Medicine

## 2014-09-09 ENCOUNTER — Encounter (HOSPITAL_COMMUNITY)
Admission: RE | Admit: 2014-09-09 | Discharge: 2014-09-09 | Disposition: A | Discharge status: Home / Self Care | Payer: Self-pay | Source: Ambulatory Visit | Attending: Internal Medicine | Admitting: Internal Medicine

## 2014-09-09 ENCOUNTER — Encounter: Payer: Self-pay | Admitting: Internal Medicine

## 2014-09-11 ENCOUNTER — Encounter (HOSPITAL_COMMUNITY)
Admission: RE | Admit: 2014-09-11 | Discharge: 2014-09-11 | Disposition: A | Discharge status: Home / Self Care | Payer: Self-pay | Source: Ambulatory Visit | Attending: Internal Medicine | Admitting: Internal Medicine

## 2014-09-14 ENCOUNTER — Encounter (HOSPITAL_COMMUNITY): Payer: Self-pay

## 2014-09-16 ENCOUNTER — Encounter (HOSPITAL_COMMUNITY): Payer: Self-pay

## 2014-09-18 ENCOUNTER — Encounter (HOSPITAL_COMMUNITY): Payer: Self-pay

## 2014-09-21 ENCOUNTER — Encounter (HOSPITAL_COMMUNITY)
Admission: RE | Admit: 2014-09-21 | Discharge: 2014-09-21 | Disposition: A | Discharge status: Home / Self Care | Payer: Self-pay | Source: Ambulatory Visit | Attending: Internal Medicine | Admitting: Internal Medicine

## 2014-09-23 ENCOUNTER — Encounter (HOSPITAL_COMMUNITY)
Admission: RE | Admit: 2014-09-23 | Discharge: 2014-09-23 | Disposition: A | Discharge status: Home / Self Care | Payer: Self-pay | Source: Ambulatory Visit | Attending: Internal Medicine | Admitting: Internal Medicine

## 2014-09-25 ENCOUNTER — Encounter (HOSPITAL_COMMUNITY): Payer: Self-pay

## 2014-09-28 ENCOUNTER — Encounter (HOSPITAL_COMMUNITY): Payer: Self-pay

## 2014-09-28 DIAGNOSIS — Z5189 Encounter for other specified aftercare: Secondary | ICD-10-CM | POA: Insufficient documentation

## 2014-09-28 DIAGNOSIS — I5032 Chronic diastolic (congestive) heart failure: Secondary | ICD-10-CM | POA: Insufficient documentation

## 2014-09-28 DIAGNOSIS — Z952 Presence of prosthetic heart valve: Secondary | ICD-10-CM | POA: Insufficient documentation

## 2014-09-28 DIAGNOSIS — Z950 Presence of cardiac pacemaker: Secondary | ICD-10-CM | POA: Insufficient documentation

## 2014-09-28 DIAGNOSIS — E785 Hyperlipidemia, unspecified: Secondary | ICD-10-CM | POA: Insufficient documentation

## 2014-09-30 ENCOUNTER — Encounter (HOSPITAL_COMMUNITY)
Admission: RE | Admit: 2014-09-30 | Discharge: 2014-09-30 | Disposition: A | Discharge status: Home / Self Care | Payer: Self-pay | Source: Ambulatory Visit | Attending: Internal Medicine | Admitting: Internal Medicine

## 2014-09-30 ENCOUNTER — Ambulatory Visit (INDEPENDENT_AMBULATORY_CARE_PROVIDER_SITE_OTHER): Payer: Medicare Other | Admitting: *Deleted

## 2014-09-30 DIAGNOSIS — I442 Atrioventricular block, complete: Secondary | ICD-10-CM

## 2014-09-30 NOTE — Progress Notes (Signed)
Remote pacemaker transmission.   

## 2014-10-02 ENCOUNTER — Encounter (HOSPITAL_COMMUNITY)
Admission: RE | Admit: 2014-10-02 | Discharge: 2014-10-02 | Disposition: A | Discharge status: Home / Self Care | Payer: Self-pay | Source: Ambulatory Visit | Attending: Internal Medicine | Admitting: Internal Medicine

## 2014-10-02 LAB — MDC_IDC_ENUM_SESS_TYPE_REMOTE
Battery Impedance: 109 Ohm
Battery Voltage: 2.79 V
Brady Statistic AP VP Percent: 8 %
Brady Statistic AP VS Percent: 0 %
Brady Statistic AS VP Percent: 92 %
Brady Statistic AS VS Percent: 0 %
Lead Channel Impedance Value: 466 Ohm
Lead Channel Impedance Value: 535 Ohm
Lead Channel Pacing Threshold Amplitude: 0.625 V
Lead Channel Pacing Threshold Pulse Width: 0.4 ms
Lead Channel Setting Pacing Amplitude: 1.5 V
Lead Channel Setting Pacing Amplitude: 2 V
Lead Channel Setting Sensing Sensitivity: 2 mV
MDC IDC MSMT BATTERY REMAINING LONGEVITY: 152 mo
MDC IDC MSMT LEADCHNL RA PACING THRESHOLD PULSEWIDTH: 0.4 ms
MDC IDC MSMT LEADCHNL RA SENSING INTR AMPL: 2.8 mV
MDC IDC MSMT LEADCHNL RV PACING THRESHOLD AMPLITUDE: 0.5 V
MDC IDC SESS DTM: 20160203154752
MDC IDC SET LEADCHNL RV PACING PULSEWIDTH: 0.4 ms

## 2014-10-05 ENCOUNTER — Encounter (HOSPITAL_COMMUNITY)
Admission: RE | Admit: 2014-10-05 | Discharge: 2014-10-05 | Disposition: A | Discharge status: Home / Self Care | Payer: Self-pay | Source: Ambulatory Visit | Attending: Internal Medicine | Admitting: Internal Medicine

## 2014-10-05 IMAGING — CT CT ABD-PEL WO/W CM
1 of 8 series · 11 of 36 positions shown, 17 images · IV contrast (WATER & [ID] OMNI 300)
Comparison: 10/26/2005

CLINICAL DATA: Chronic diarrhea.  Elevated liver function tests.
History of testicular cancer.  Chronic lymphocytic leukemia.

CT ABDOMEN AND PELVIS WITHOUT AND WITH CONTRAST
TECHNIQUE: Multidetector CT imaging of the abdomen and pelvis was
performed without contrast material in one or both body regions,
followed by contrast material(s) and further sections in one or
both body regions.
Contrast: 100mL OMNIPAQUE IOHEXOL 300 MG/ML  SOLN

[Series 4: arterial/port venous (id) · axial · arterial · 0.70mm/px · z∈[-378,-45]mm · 11 of 253 slices shown, 17 images]
[im 22/253  soft-tissue]
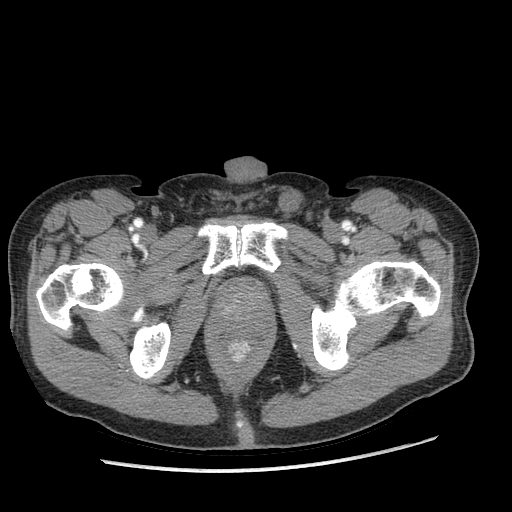
[im 22/253  bone]
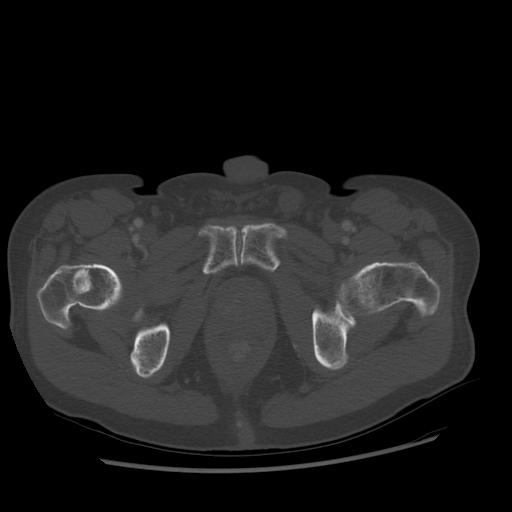
[im 43/253  soft-tissue]
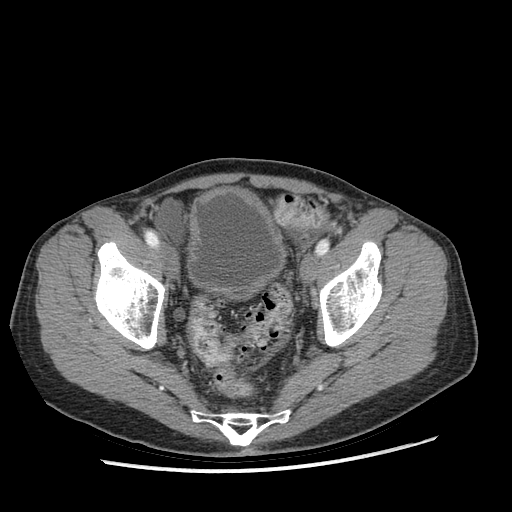
[im 64/253  soft-tissue]
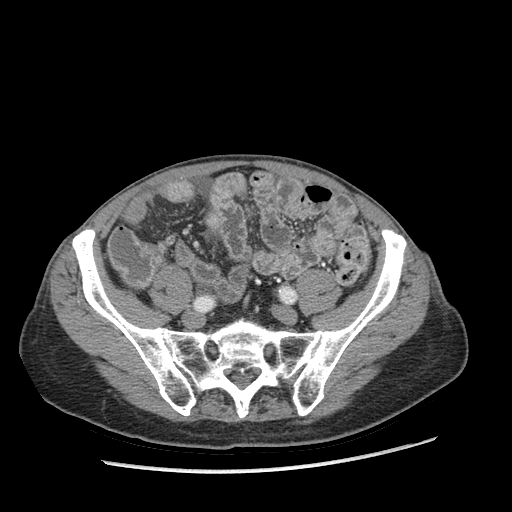
[im 85/253  soft-tissue]
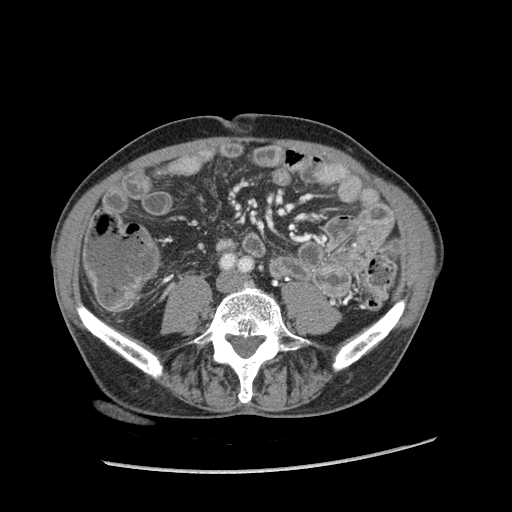
[im 106/253  soft-tissue]
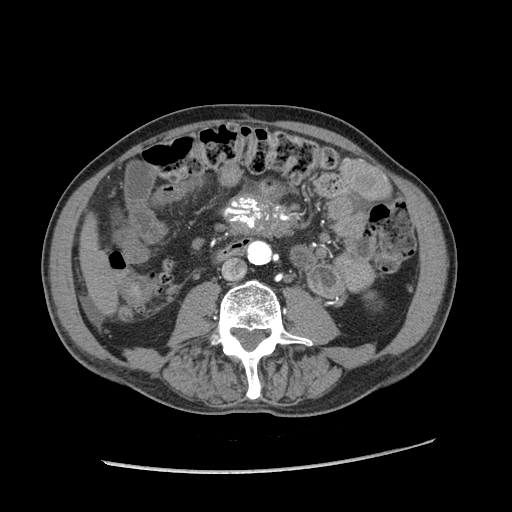
[im 127/253  soft-tissue]
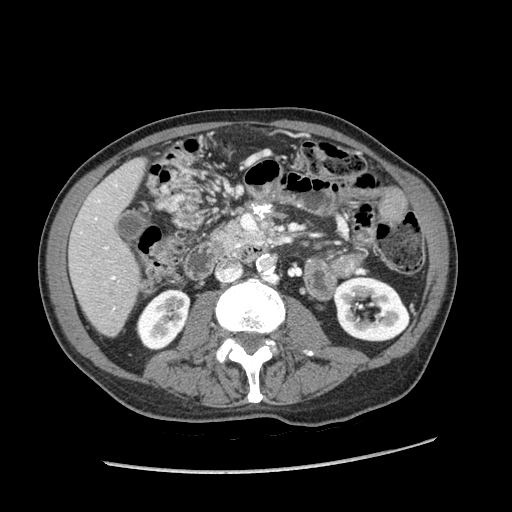
[im 148/253  soft-tissue]
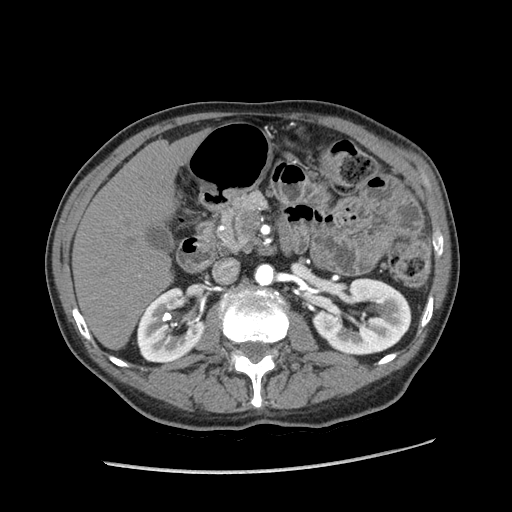
[im 169/253  soft-tissue]
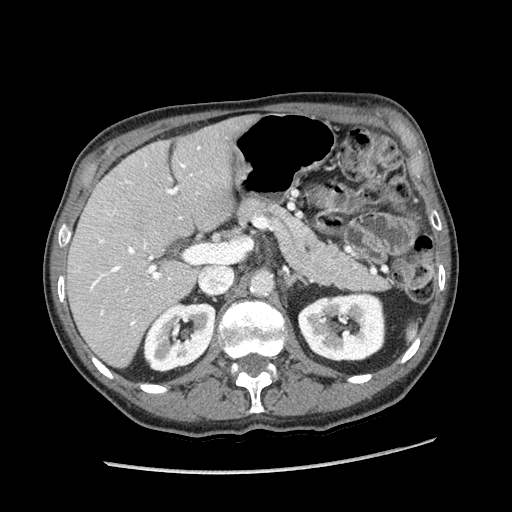
[im 169/253  lung]
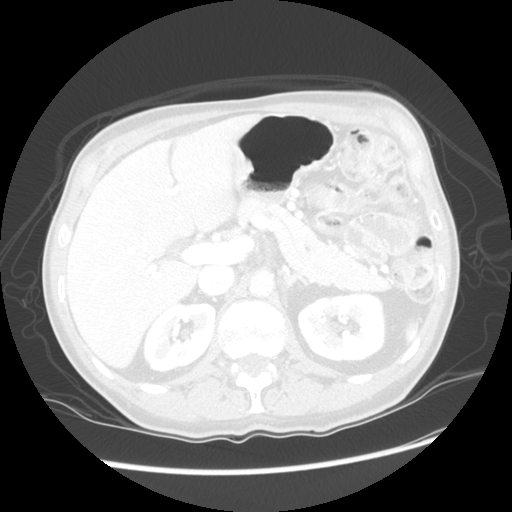
[im 190/253  soft-tissue]
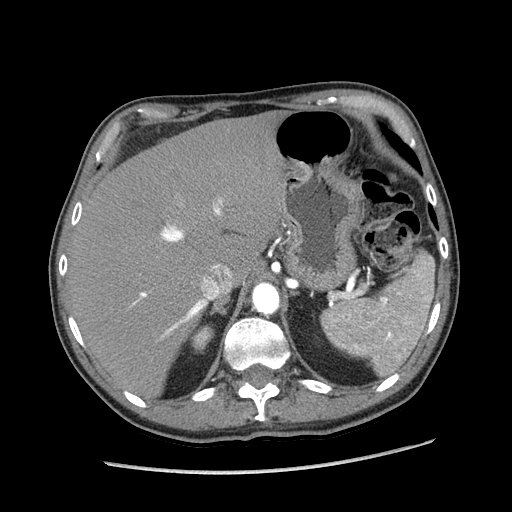
[im 190/253  lung]
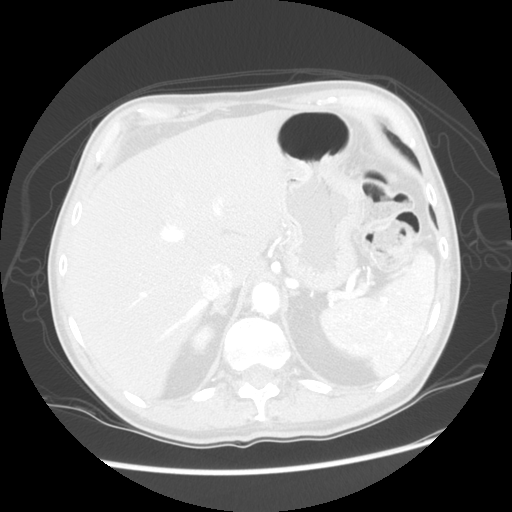
[im 190/253  bone]
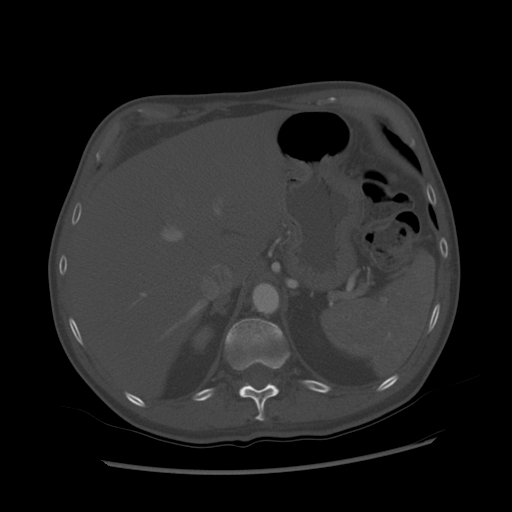
[im 211/253  soft-tissue]
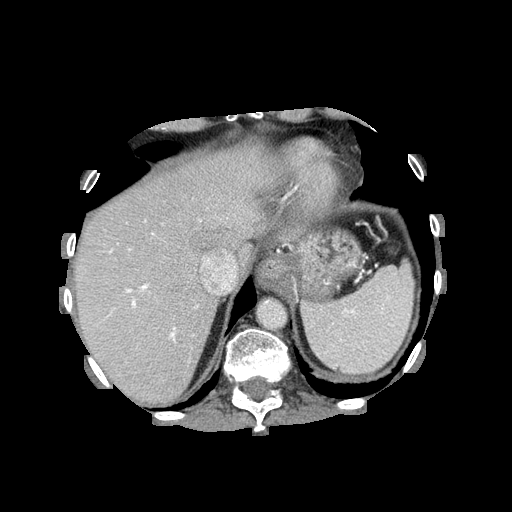
[im 211/253  lung]
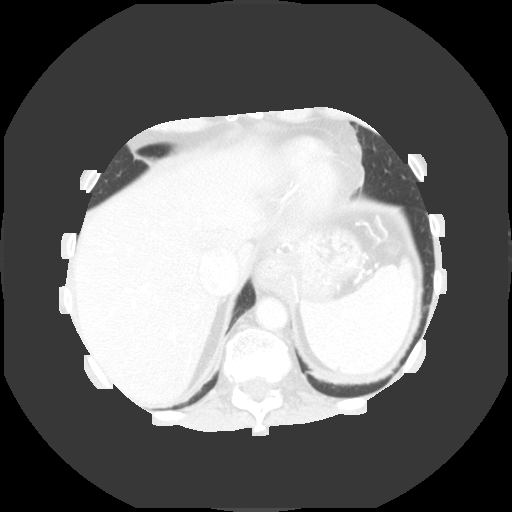
[im 232/253  soft-tissue]
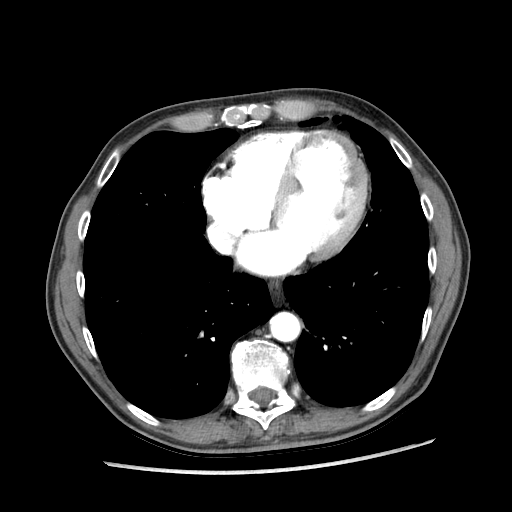
[im 232/253  lung]
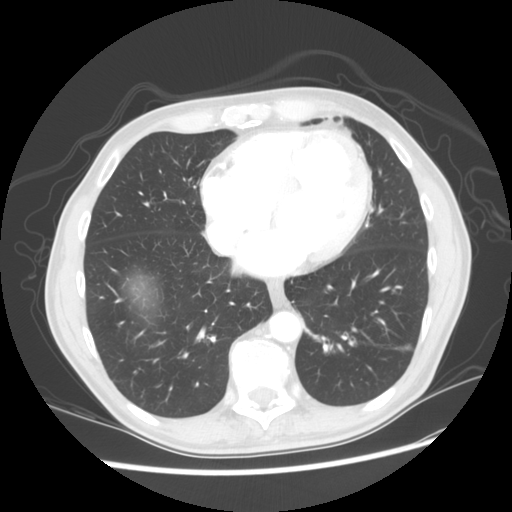

[11 of 36 positions shown; findings below may reference images not displayed]

FINDINGS: 6 mm nonobstructive right mid kidney calculus observed on
the noncontrast images.

A mass extending along the superior mesenteric artery and its
branches just below the pancreas has extensive internal
calcifications and measures 6.3 by 2.7 by 5.5 cm, with associated
cicatricial reaction in the mesentery, and with occlusion of
inferior portions of the superior mesenteric vein which
reconstitutes above the level of the mass.  The mass is immediately
adjacent to but not definitively arising from the pancreas, and no
pancreatic duct dilatation is observed.  There appears to be venous
congestion below the level of the mass

The proper hepatic artery gives rise to the right and left hepatic
arteries (conventional branching pattern).

A fat density lesion along the liver capsule in the dome of the
liver measures 1.2 x 0.8 cm, image 90 of series 5.  There is a
hypodense lesion at the junction of segments 4a and 4b, measuring
0.8 x 0.6 cm on image 18 of series 5.

Arterial phase enhancement of a medial splenic lesion is present,
measuring 1.6 cm in diameter, but essentially isointense on portal
venous phase images - this lesion is not readily apparent on the
prior CT scan.

A small amount of perihepatic ascites is present.  No definite
tumor nodules along the liver margin (the omentum.  A small
umbilical hernia contains adipose tissue.

Borderline wall thickening is present in the small bowel.  Colonic
diverticulosis noted including the descending and sigmoid colon.
Small amount of pelvic ascites is noted.

There is considerable prostate gland enlargement.  Urinary bladder
wall thickening is slightly irregular.  There is likely a small
amount of fluid in the left inguinal canal and tracking along the
spermatic cord.

No pathologic pelvic adenopathy is identified.

A 3 mm hypodense right kidney upper pole lesion is likely a cyst.

Sclerotic lesion marginated lucency in the right femoral neck with
is observed, stable from prior exam from 4881 based on the scout
topogram.
IMPRESSION: 1.  Irregular calcified mass in the root of the mesentery surrounds
the superior mesenteric artery and its branches, and occludes the
lower portion of the superior mesenteric vein.  Differential
diagnostic considerations may include carcinoid tumor, leukemic
mass, gastrointestinal stromal tumor, partially treated or
recurrent testicular cancer, or less likely inflammatory
pseudotumor.  Tissue diagnosis and/or PET CT recommended.
2.  Mild ascites.
3.  Nonspecific arterial phase enhancing mass medially in the
spleen, suspicious for malignancy, with hemangioma less likely.
4.  Nonobstructive right nephrolithiasis.
5.  Mild diffuse small bowel wall thickening may be secondary to
the venous congestion related to the mass.
6.  Nonspecific small hypodense lesion in segment four of the
liver.
7.  A large prostate gland.
8.  Irregular wall thickening with a trabecular pattern, query
bladder outlet obstruction.
9.  Probable bone infarct in the right femoral neck, no change form

## 2014-10-07 ENCOUNTER — Encounter (HOSPITAL_COMMUNITY)
Admission: RE | Admit: 2014-10-07 | Discharge: 2014-10-07 | Disposition: A | Discharge status: Home / Self Care | Payer: Self-pay | Source: Ambulatory Visit | Attending: Internal Medicine | Admitting: Internal Medicine

## 2014-10-09 ENCOUNTER — Encounter (HOSPITAL_COMMUNITY)
Admission: RE | Admit: 2014-10-09 | Discharge: 2014-10-09 | Disposition: A | Discharge status: Home / Self Care | Payer: Self-pay | Source: Ambulatory Visit | Attending: Internal Medicine | Admitting: Internal Medicine

## 2014-10-12 ENCOUNTER — Encounter (HOSPITAL_COMMUNITY): Payer: Self-pay

## 2014-10-14 ENCOUNTER — Encounter (HOSPITAL_COMMUNITY)
Admission: RE | Admit: 2014-10-14 | Discharge: 2014-10-14 | Disposition: A | Discharge status: Home / Self Care | Payer: Self-pay | Source: Ambulatory Visit | Attending: Internal Medicine | Admitting: Internal Medicine

## 2014-10-16 ENCOUNTER — Encounter (HOSPITAL_COMMUNITY)
Admission: RE | Admit: 2014-10-16 | Discharge: 2014-10-16 | Disposition: A | Discharge status: Home / Self Care | Payer: Self-pay | Source: Ambulatory Visit | Attending: Internal Medicine | Admitting: Internal Medicine

## 2014-10-19 ENCOUNTER — Encounter (HOSPITAL_COMMUNITY)
Admission: RE | Admit: 2014-10-19 | Discharge: 2014-10-19 | Disposition: A | Discharge status: Home / Self Care | Payer: Self-pay | Source: Ambulatory Visit | Attending: Internal Medicine | Admitting: Internal Medicine

## 2014-10-21 ENCOUNTER — Encounter (HOSPITAL_COMMUNITY)
Admission: RE | Admit: 2014-10-21 | Discharge: 2014-10-21 | Disposition: A | Discharge status: Home / Self Care | Payer: Self-pay | Source: Ambulatory Visit | Attending: Internal Medicine | Admitting: Internal Medicine

## 2014-10-23 ENCOUNTER — Other Ambulatory Visit (HOSPITAL_COMMUNITY): Payer: Self-pay | Admitting: *Deleted

## 2014-10-23 ENCOUNTER — Encounter (HOSPITAL_COMMUNITY)
Admission: RE | Admit: 2014-10-23 | Discharge: 2014-10-23 | Disposition: A | Discharge status: Home / Self Care | Payer: Self-pay | Source: Ambulatory Visit | Attending: Internal Medicine | Admitting: Internal Medicine

## 2014-10-23 MED ORDER — POTASSIUM CHLORIDE CRYS ER 20 MEQ PO TBCR: EXTENDED_RELEASE_TABLET | ORAL | Status: DC

## 2014-10-26 ENCOUNTER — Encounter: Payer: Self-pay | Admitting: Cardiology

## 2014-10-26 ENCOUNTER — Encounter (HOSPITAL_COMMUNITY)
Admission: RE | Admit: 2014-10-26 | Discharge: 2014-10-26 | Disposition: A | Discharge status: Home / Self Care | Payer: Self-pay | Source: Ambulatory Visit | Attending: Internal Medicine | Admitting: Internal Medicine

## 2014-10-28 ENCOUNTER — Encounter (HOSPITAL_COMMUNITY)
Admission: RE | Admit: 2014-10-28 | Discharge: 2014-10-28 | Disposition: A | Discharge status: Home / Self Care | Payer: Self-pay | Source: Ambulatory Visit | Attending: Internal Medicine | Admitting: Internal Medicine

## 2014-10-28 DIAGNOSIS — E785 Hyperlipidemia, unspecified: Secondary | ICD-10-CM | POA: Insufficient documentation

## 2014-10-28 DIAGNOSIS — Z952 Presence of prosthetic heart valve: Secondary | ICD-10-CM | POA: Insufficient documentation

## 2014-10-28 DIAGNOSIS — Z5189 Encounter for other specified aftercare: Secondary | ICD-10-CM | POA: Insufficient documentation

## 2014-10-28 DIAGNOSIS — I5032 Chronic diastolic (congestive) heart failure: Secondary | ICD-10-CM | POA: Insufficient documentation

## 2014-10-28 DIAGNOSIS — Z950 Presence of cardiac pacemaker: Secondary | ICD-10-CM | POA: Insufficient documentation

## 2014-10-30 ENCOUNTER — Encounter (HOSPITAL_COMMUNITY)
Admission: RE | Admit: 2014-10-30 | Discharge: 2014-10-30 | Disposition: A | Discharge status: Home / Self Care | Payer: Self-pay | Source: Ambulatory Visit | Attending: Internal Medicine | Admitting: Internal Medicine

## 2014-11-02 ENCOUNTER — Encounter: Payer: Self-pay | Admitting: Internal Medicine

## 2014-11-02 ENCOUNTER — Encounter (HOSPITAL_COMMUNITY)
Admission: RE | Admit: 2014-11-02 | Discharge: 2014-11-02 | Disposition: A | Discharge status: Home / Self Care | Payer: Self-pay | Source: Ambulatory Visit | Attending: Internal Medicine | Admitting: Internal Medicine

## 2014-11-04 ENCOUNTER — Encounter (HOSPITAL_COMMUNITY)
Admission: RE | Admit: 2014-11-04 | Discharge: 2014-11-04 | Disposition: A | Discharge status: Home / Self Care | Payer: Self-pay | Source: Ambulatory Visit | Attending: Internal Medicine | Admitting: Internal Medicine

## 2014-11-06 ENCOUNTER — Encounter (HOSPITAL_COMMUNITY)
Admission: RE | Admit: 2014-11-06 | Discharge: 2014-11-06 | Disposition: A | Discharge status: Home / Self Care | Payer: Self-pay | Source: Ambulatory Visit | Attending: Internal Medicine | Admitting: Internal Medicine

## 2014-11-09 ENCOUNTER — Encounter (HOSPITAL_COMMUNITY)
Admission: RE | Admit: 2014-11-09 | Discharge: 2014-11-09 | Disposition: A | Discharge status: Home / Self Care | Payer: Self-pay | Source: Ambulatory Visit | Attending: Internal Medicine | Admitting: Internal Medicine

## 2014-11-11 ENCOUNTER — Encounter (HOSPITAL_COMMUNITY)
Admission: RE | Admit: 2014-11-11 | Discharge: 2014-11-11 | Disposition: A | Discharge status: Home / Self Care | Payer: Self-pay | Source: Ambulatory Visit | Attending: Internal Medicine | Admitting: Internal Medicine

## 2014-11-12 ENCOUNTER — Emergency Department (HOSPITAL_COMMUNITY)
Admission: EM | Admit: 2014-11-12 | Discharge: 2014-11-12 | Disposition: A | Discharge status: Home / Self Care | Payer: Medicare Other | Attending: Emergency Medicine | Admitting: Emergency Medicine

## 2014-11-12 ENCOUNTER — Emergency Department (HOSPITAL_COMMUNITY): Payer: Medicare Other

## 2014-11-12 ENCOUNTER — Encounter (HOSPITAL_COMMUNITY): Payer: Self-pay | Admitting: Emergency Medicine

## 2014-11-12 DIAGNOSIS — E785 Hyperlipidemia, unspecified: Secondary | ICD-10-CM | POA: Insufficient documentation

## 2014-11-12 DIAGNOSIS — W270XXA Contact with workbench tool, initial encounter: Secondary | ICD-10-CM | POA: Insufficient documentation

## 2014-11-12 DIAGNOSIS — Z8547 Personal history of malignant neoplasm of testis: Secondary | ICD-10-CM | POA: Insufficient documentation

## 2014-11-12 DIAGNOSIS — E119 Type 2 diabetes mellitus without complications: Secondary | ICD-10-CM | POA: Insufficient documentation

## 2014-11-12 DIAGNOSIS — Z950 Presence of cardiac pacemaker: Secondary | ICD-10-CM | POA: Diagnosis not present

## 2014-11-12 DIAGNOSIS — Y9389 Activity, other specified: Secondary | ICD-10-CM | POA: Insufficient documentation

## 2014-11-12 DIAGNOSIS — J45909 Unspecified asthma, uncomplicated: Secondary | ICD-10-CM | POA: Diagnosis not present

## 2014-11-12 DIAGNOSIS — R011 Cardiac murmur, unspecified: Secondary | ICD-10-CM | POA: Insufficient documentation

## 2014-11-12 DIAGNOSIS — Z7982 Long term (current) use of aspirin: Secondary | ICD-10-CM | POA: Insufficient documentation

## 2014-11-12 DIAGNOSIS — Z880 Allergy status to penicillin: Secondary | ICD-10-CM | POA: Diagnosis not present

## 2014-11-12 DIAGNOSIS — Z79899 Other long term (current) drug therapy: Secondary | ICD-10-CM | POA: Diagnosis not present

## 2014-11-12 DIAGNOSIS — Z23 Encounter for immunization: Secondary | ICD-10-CM | POA: Insufficient documentation

## 2014-11-12 DIAGNOSIS — Y998 Other external cause status: Secondary | ICD-10-CM | POA: Diagnosis not present

## 2014-11-12 DIAGNOSIS — F329 Major depressive disorder, single episode, unspecified: Secondary | ICD-10-CM | POA: Insufficient documentation

## 2014-11-12 DIAGNOSIS — S61012A Laceration without foreign body of left thumb without damage to nail, initial encounter: Secondary | ICD-10-CM | POA: Diagnosis not present

## 2014-11-12 DIAGNOSIS — Z856 Personal history of leukemia: Secondary | ICD-10-CM | POA: Diagnosis not present

## 2014-11-12 DIAGNOSIS — Z7951 Long term (current) use of inhaled steroids: Secondary | ICD-10-CM | POA: Insufficient documentation

## 2014-11-12 DIAGNOSIS — Y9289 Other specified places as the place of occurrence of the external cause: Secondary | ICD-10-CM | POA: Insufficient documentation

## 2014-11-12 DIAGNOSIS — Z8679 Personal history of other diseases of the circulatory system: Secondary | ICD-10-CM | POA: Diagnosis not present

## 2014-11-12 MED ORDER — TETANUS-DIPHTH-ACELL PERTUSSIS 5-2.5-18.5 LF-MCG/0.5 IM SUSP
0.5000 mL | Freq: Once | INTRAMUSCULAR | Status: AC
  Administered 2014-11-12: 0.5 mL via INTRAMUSCULAR
  Filled 2014-11-12: qty 0.5

## 2014-11-12 NOTE — ED Notes (Signed)
Did wound care on patient soaked it in saline

## 2014-11-12 NOTE — ED Notes (Signed)
Pt reports laceration to left thumb from tree saw. Wound appears superficial. Bleeding controlled.

## 2014-11-12 NOTE — Discharge Instructions (Signed)

## 2014-11-12 NOTE — ED Notes (Signed)
Declined W/C at D/C and was escorted to lobby by RN. 

## 2014-11-12 NOTE — ED Provider Notes (Signed)
CSN: [id number]     Arrival date & time 11/12/14  1135 History  This chart was scribed for non-physician practitioner Glendell Docker, NP working with Charlesetta Shanks, MD, by Eustaquio Maize, ED Scribe. This patient was seen in room TR10C/TR10C and the patient's care was started at 12:13 PM.    Chief Complaint  Patient presents with  . Extremity Laceration   The history is provided by the patient. No language interpreter was used.     HPI Comments: [name] is a 74 y.o. male who presents to the Emergency Department complaining of laceration to left thumb that occurred earlier today. Pt reports he cut his thumb on a tree saw. He admits to increased pain and tenderness to the area. Bleeding is controlled at the moment. Pt is unsure if he is UTD on tetanus.    Past Medical History  Diagnosis Date  . Aortic stenosis     s/p AVR 6/14  . Exposure to secondhand smoke     extensive exposure  . Hyperlipidemia     treated  . Depression   . Asthma   . Heart murmur   . LBBB (left bundle branch block)   . Shortness of breath     exertion  . DM2 (diabetes mellitus, type 2)     fasting blood sugar - 90s  . Dizziness   . Testicular cancer     s/p XRT and surgical resection.  . Chronic lymphocytic leukemia     followed by Dr. Ouida Sills  . Atrioventricular block, complete   . Pacemaker    Past Surgical History  Procedure Laterality Date  . Xrt and surgical resection      s/p. 10 years ago  . Stapendectomy  07/30/02    right  . Inner ear surgery    . Cyst removal neck    . Aortic valve replacement N/A 02/17/2013    Procedure: AORTIC VALVE REPLACEMENT (AVR);  Surgeon: Gaye Pollack, MD;  Location: [place];  Service: Open Heart Surgery;  Laterality: N/A;  . Intraoperative transesophageal echocardiogram N/A 02/17/2013    Procedure: INTRAOPERATIVE TRANSESOPHAGEAL ECHOCARDIOGRAM;  Surgeon: Gaye Pollack, MD;  Location: [facility] OR;  Service: Open Heart Surgery;  Laterality: N/A;  . Left and  right heart catheterization with coronary angiogram N/A 12/25/2012    Procedure: LEFT AND RIGHT HEART CATHETERIZATION WITH CORONARY ANGIOGRAM;  Surgeon: Jolaine Artist, MD;  Location: [facility] CATH LAB;  Service: Cardiovascular;  Laterality: N/A;  . Permanent pacemaker insertion N/A 02/19/2013    Procedure: PERMANENT PACEMAKER INSERTION;  Surgeon: Deboraha Sprang, MD;  Location: [facility] CATH LAB;  Service: Cardiovascular;  Laterality: N/A;   Family History  Problem Relation Age of Onset  . Cancer Father     lung   History  Substance Use Topics  . Smoking status: Never Smoker   . Smokeless tobacco: Not on file  . Alcohol Use: No    Review of Systems  Musculoskeletal: Positive for arthralgias (Pain to left thumb. ).  Skin: Positive for wound (Laceration to left thumb.).  All other systems reviewed and are negative.     Allergies  Banana and Penicillins  Home Medications   Prior to Admission medications   Medication Sig Start Date End Date Taking? Authorizing Provider  acetaminophen (TYLENOL) 500 MG tablet Take 500 mg by mouth 3 (three) times daily.     Historical Provider, MD  aspirin 81 MG tablet Take 1 tablet (81 mg total) by mouth daily. 06/09/13  Deboraha Sprang, MD  atorvastatin (LIPITOR) 40 MG tablet Take 40 mg by mouth daily.    Historical Provider, MD  bifidobacterium infantis (ALIGN) capsule Take 1 capsule by mouth daily.    Historical Provider, MD  busPIRone (BUSPAR) 15 MG tablet Take by mouth. One tablet every morning and two tablets every day at noon    Historical Provider, MD  Cholestyramine POWD Take 1 scoop by mouth 2 (two) times daily.     Historical Provider, MD  cycloSPORINE (RESTASIS) 0.05 % ophthalmic emulsion Place 1 drop into both eyes 2 (two) times daily.     Historical Provider, MD  desloratadine (CLARINEX) 5 MG tablet Take 5 mg by mouth daily.     Historical Provider, MD  fenofibrate 160 MG tablet Take 160 mg by mouth daily.     Historical Provider, MD   fluticasone (FLONASE) 50 MCG/ACT nasal spray Place 2 sprays into the nose daily.     Historical Provider, MD  FORA [phone] BLOOD GLUCOSE TEST test strip  07/31/12   Historical Provider, MD  furosemide (LASIX) 40 MG tablet Take 40 mg in AM and 20 mg in PM, may take additional 20 mg in the PM as needed for fluid 01/23/14   Jolaine Artist, MD  furosemide (LASIX) 40 MG tablet TAKE 1 TABLET (40mg ) IN THE MORNING AND TAKE 1/2 TABLET (20mg ) IN THEEVENING- MAY TAKE ADDITIONAL 1/2 TABLET (20mg ) INTHE EVENING AS NEEDED 05/07/14   Jolaine Artist, MD  gabapentin (NEURONTIN) 300 MG capsule One capsule in the morning and 2 capsules at noon    Historical Provider, MD  [place]  07/31/12   Historical Provider, MD  Multiple Vitamin (MULTIVITAMIN) tablet Take 1 tablet by mouth daily.     Historical Provider, MD  Polyethyl Glycol-Propyl Glycol (SYSTANE OP) Place 1 drop into both eyes 2 (two) times daily.    Historical Provider, MD  potassium chloride SA (K-DUR,KLOR-CON) 20 MEQ tablet TAKE ONE TABLET EACH DAY 10/23/14   Jolaine Artist, MD  PROCTOZONE-HC 2.5 % rectal cream Place 1 application rectally 2 (two) times daily as needed.  07/24/12   Historical Provider, MD  sertraline (ZOLOFT) 100 MG tablet Take 100 mg by mouth daily.     Historical Provider, MD  traZODone (DESYREL) 50 MG tablet Take 150 mg by mouth at bedtime.     Historical Provider, MD   Triage Vitals: BP 110/62 mmHg  Pulse 73  Temp(Src) 98.5 F (36.9 C) (Oral)  Resp 16  Ht 5\' 3"  (1.6 m)  Wt 124 lb (56.246 kg)  BMI 21.97 kg/m2  SpO2 94%   Physical Exam  Constitutional: He is oriented to person, place, and time. He appears well-developed and well-nourished. No distress.  HENT:  Head: Normocephalic and atraumatic.  Eyes: Conjunctivae and EOM are normal.  Neck: Neck supple. No tracheal deviation present.  Cardiovascular: Normal rate.   Pulmonary/Chest: Effort normal. No respiratory distress.  Musculoskeletal: Normal range of  motion.  Neurological: He is alert and oriented to person, place, and time.  Skin: Skin is warm and dry.  Macerated cuts to the left thumb. Full rom. Neurovascularly intact  Psychiatric: He has a normal mood and affect. His behavior is normal.  Nursing note and vitals reviewed.   ED Course  LACERATION REPAIR Date/Time: 11/12/2014 1:35 PM Performed by: Glendell Docker Authorized by: Glendell Docker Consent: Verbal consent obtained. Consent given by: patient Patient identity confirmed: verbally with patient Body area: upper extremity Location details: left thumb  Laceration length: 3 cm Foreign bodies: no foreign bodies Irrigation solution: saline Irrigation method: tap Skin closure: glue and Steri-Strips Technique: complex Approximation: close Approximation difficulty: complex Dressing: splint Patient tolerance: Patient tolerated the procedure well with no immediate complications   (including critical care time)  DIAGNOSTIC STUDIES: Oxygen Saturation is 94% on RA, normal by my interpretation.    COORDINATION OF CARE: 12:14 PM-Discussed treatment plan which includes dermabond and steri strips with pt at bedside and pt agreed to plan.   Labs Review Labs Reviewed - No data to display  Imaging Review Dg Finger Thumb Left  11/12/2014   CLINICAL DATA:  Cut left thumb with saw.  Initial encounter.  EXAM: LEFT THUMB 2+V  COMPARISON:  None.  FINDINGS: There is no evidence of fracture or dislocation. No opaque foreign body.  IMPRESSION: No fracture or opaque foreign body.   Electronically Signed   By: Monte Fantasia M.D.   On: 11/12/2014 13:07     EKG Interpretation None      MDM   Final diagnoses:  Thumb laceration, left, initial encounter   No fracture or fb noted. Wound closed without any problem  I personally performed the services described in this documentation, which was scribed in my presence. The recorded information has been reviewed and is  accurate.      Glendell Docker, NP 11/12/14 [place], MD 11/14/14 [phone]

## 2014-11-13 ENCOUNTER — Encounter (HOSPITAL_COMMUNITY)
Admission: RE | Admit: 2014-11-13 | Discharge: 2014-11-13 | Disposition: A | Discharge status: Home / Self Care | Payer: Self-pay | Source: Ambulatory Visit | Attending: Internal Medicine | Admitting: Internal Medicine

## 2014-11-16 ENCOUNTER — Encounter (HOSPITAL_COMMUNITY)
Admission: RE | Admit: 2014-11-16 | Discharge: 2014-11-16 | Disposition: A | Discharge status: Home / Self Care | Payer: Self-pay | Source: Ambulatory Visit | Attending: Internal Medicine | Admitting: Internal Medicine

## 2014-11-18 ENCOUNTER — Encounter (HOSPITAL_COMMUNITY): Payer: Self-pay

## 2014-11-20 ENCOUNTER — Encounter (HOSPITAL_COMMUNITY)
Admission: RE | Admit: 2014-11-20 | Discharge: 2014-11-20 | Disposition: A | Discharge status: Home / Self Care | Payer: Self-pay | Source: Ambulatory Visit | Attending: Internal Medicine | Admitting: Internal Medicine

## 2014-11-23 ENCOUNTER — Encounter (HOSPITAL_COMMUNITY)
Admission: RE | Admit: 2014-11-23 | Discharge: 2014-11-23 | Disposition: A | Discharge status: Home / Self Care | Payer: Self-pay | Source: Ambulatory Visit | Attending: Internal Medicine | Admitting: Internal Medicine

## 2014-11-25 ENCOUNTER — Encounter (HOSPITAL_COMMUNITY)
Admission: RE | Admit: 2014-11-25 | Discharge: 2014-11-25 | Disposition: A | Discharge status: Home / Self Care | Payer: Self-pay | Source: Ambulatory Visit | Attending: Internal Medicine | Admitting: Internal Medicine

## 2014-11-27 ENCOUNTER — Encounter (HOSPITAL_COMMUNITY): Payer: Federal, State, Local not specified - PPO

## 2014-11-27 DIAGNOSIS — I5032 Chronic diastolic (congestive) heart failure: Secondary | ICD-10-CM | POA: Insufficient documentation

## 2014-11-27 DIAGNOSIS — Z5189 Encounter for other specified aftercare: Secondary | ICD-10-CM | POA: Insufficient documentation

## 2014-11-27 DIAGNOSIS — Z952 Presence of prosthetic heart valve: Secondary | ICD-10-CM | POA: Insufficient documentation

## 2014-11-27 DIAGNOSIS — Z950 Presence of cardiac pacemaker: Secondary | ICD-10-CM | POA: Insufficient documentation

## 2014-11-27 DIAGNOSIS — E785 Hyperlipidemia, unspecified: Secondary | ICD-10-CM | POA: Insufficient documentation

## 2014-11-30 ENCOUNTER — Encounter (HOSPITAL_COMMUNITY): Payer: Self-pay

## 2014-12-02 ENCOUNTER — Encounter (HOSPITAL_COMMUNITY)
Admission: RE | Admit: 2014-12-02 | Discharge: 2014-12-02 | Disposition: A | Discharge status: Home / Self Care | Payer: Self-pay | Source: Ambulatory Visit | Attending: Internal Medicine | Admitting: Internal Medicine

## 2014-12-04 ENCOUNTER — Encounter (HOSPITAL_COMMUNITY)
Admission: RE | Admit: 2014-12-04 | Discharge: 2014-12-04 | Disposition: A | Discharge status: Home / Self Care | Payer: Self-pay | Source: Ambulatory Visit | Attending: Internal Medicine | Admitting: Internal Medicine

## 2014-12-07 ENCOUNTER — Encounter (HOSPITAL_COMMUNITY)
Admission: RE | Admit: 2014-12-07 | Discharge: 2014-12-07 | Disposition: A | Discharge status: Home / Self Care | Payer: Self-pay | Source: Ambulatory Visit | Attending: Internal Medicine | Admitting: Internal Medicine

## 2014-12-09 ENCOUNTER — Encounter (HOSPITAL_COMMUNITY)
Admission: RE | Admit: 2014-12-09 | Discharge: 2014-12-09 | Disposition: A | Discharge status: Home / Self Care | Payer: Self-pay | Source: Ambulatory Visit | Attending: Internal Medicine | Admitting: Internal Medicine

## 2014-12-11 ENCOUNTER — Encounter (HOSPITAL_COMMUNITY)
Admission: RE | Admit: 2014-12-11 | Discharge: 2014-12-11 | Disposition: A | Discharge status: Home / Self Care | Payer: Self-pay | Source: Ambulatory Visit | Attending: Internal Medicine | Admitting: Internal Medicine

## 2014-12-14 ENCOUNTER — Encounter (HOSPITAL_COMMUNITY): Payer: Self-pay

## 2014-12-16 ENCOUNTER — Encounter (HOSPITAL_COMMUNITY): Payer: Self-pay

## 2014-12-18 ENCOUNTER — Encounter (HOSPITAL_COMMUNITY): Payer: Self-pay

## 2014-12-21 ENCOUNTER — Encounter (HOSPITAL_COMMUNITY)
Admission: RE | Admit: 2014-12-21 | Discharge: 2014-12-21 | Disposition: A | Discharge status: Home / Self Care | Payer: Self-pay | Source: Ambulatory Visit | Attending: Internal Medicine | Admitting: Internal Medicine

## 2014-12-22 ENCOUNTER — Other Ambulatory Visit (HOSPITAL_COMMUNITY): Payer: Self-pay | Admitting: Internal Medicine

## 2014-12-23 ENCOUNTER — Encounter (HOSPITAL_COMMUNITY)
Admission: RE | Admit: 2014-12-23 | Discharge: 2014-12-23 | Disposition: A | Discharge status: Home / Self Care | Payer: Self-pay | Source: Ambulatory Visit | Attending: Internal Medicine | Admitting: Internal Medicine

## 2014-12-25 ENCOUNTER — Encounter (HOSPITAL_COMMUNITY)
Admission: RE | Admit: 2014-12-25 | Discharge: 2014-12-25 | Disposition: A | Discharge status: Home / Self Care | Payer: Self-pay | Source: Ambulatory Visit | Attending: Internal Medicine | Admitting: Internal Medicine

## 2014-12-28 ENCOUNTER — Encounter (HOSPITAL_COMMUNITY)
Admission: RE | Admit: 2014-12-28 | Discharge: 2014-12-28 | Disposition: A | Discharge status: Home / Self Care | Payer: Self-pay | Source: Ambulatory Visit | Attending: Internal Medicine | Admitting: Internal Medicine

## 2014-12-28 DIAGNOSIS — I5032 Chronic diastolic (congestive) heart failure: Secondary | ICD-10-CM | POA: Insufficient documentation

## 2014-12-28 DIAGNOSIS — Z950 Presence of cardiac pacemaker: Secondary | ICD-10-CM | POA: Insufficient documentation

## 2014-12-28 DIAGNOSIS — E785 Hyperlipidemia, unspecified: Secondary | ICD-10-CM | POA: Insufficient documentation

## 2014-12-28 DIAGNOSIS — Z5189 Encounter for other specified aftercare: Secondary | ICD-10-CM | POA: Insufficient documentation

## 2014-12-28 DIAGNOSIS — Z952 Presence of prosthetic heart valve: Secondary | ICD-10-CM | POA: Insufficient documentation

## 2014-12-30 ENCOUNTER — Encounter (HOSPITAL_COMMUNITY)
Admission: RE | Admit: 2014-12-30 | Discharge: 2014-12-30 | Disposition: A | Discharge status: Home / Self Care | Payer: Self-pay | Source: Ambulatory Visit | Attending: Internal Medicine | Admitting: Internal Medicine

## 2015-01-01 ENCOUNTER — Encounter (HOSPITAL_COMMUNITY)
Admission: RE | Admit: 2015-01-01 | Discharge: 2015-01-01 | Disposition: A | Discharge status: Home / Self Care | Payer: Self-pay | Source: Ambulatory Visit | Attending: Internal Medicine | Admitting: Internal Medicine

## 2015-01-04 ENCOUNTER — Ambulatory Visit (INDEPENDENT_AMBULATORY_CARE_PROVIDER_SITE_OTHER): Payer: Medicare Other | Admitting: *Deleted

## 2015-01-04 ENCOUNTER — Encounter (HOSPITAL_COMMUNITY)
Admission: RE | Admit: 2015-01-04 | Discharge: 2015-01-04 | Disposition: A | Discharge status: Home / Self Care | Payer: Self-pay | Source: Ambulatory Visit | Attending: Internal Medicine | Admitting: Internal Medicine

## 2015-01-04 DIAGNOSIS — I442 Atrioventricular block, complete: Secondary | ICD-10-CM | POA: Diagnosis not present

## 2015-01-04 NOTE — Progress Notes (Signed)
Remote pacemaker transmission.   

## 2015-01-06 ENCOUNTER — Encounter (HOSPITAL_COMMUNITY)
Admission: RE | Admit: 2015-01-06 | Discharge: 2015-01-06 | Disposition: A | Discharge status: Home / Self Care | Payer: Self-pay | Source: Ambulatory Visit | Attending: Internal Medicine | Admitting: Internal Medicine

## 2015-01-06 LAB — CUP PACEART REMOTE DEVICE CHECK
Battery Impedance: 133 Ohm
Battery Remaining Longevity: 145 mo
Battery Voltage: 2.79 V
Brady Statistic AP VP Percent: 9 %
Brady Statistic AP VS Percent: 0 %
Brady Statistic AS VP Percent: 91 %
Brady Statistic AS VS Percent: 0 %
Lead Channel Impedance Value: 494 Ohm
Lead Channel Pacing Threshold Pulse Width: 0.4 ms
Lead Channel Setting Pacing Amplitude: 2 V
Lead Channel Setting Pacing Pulse Width: 0.4 ms
Lead Channel Setting Sensing Sensitivity: 2 mV
MDC IDC MSMT LEADCHNL RA PACING THRESHOLD AMPLITUDE: 0.625 V
MDC IDC MSMT LEADCHNL RA SENSING INTR AMPL: 2.8 mV
MDC IDC MSMT LEADCHNL RV IMPEDANCE VALUE: 546 Ohm
MDC IDC MSMT LEADCHNL RV PACING THRESHOLD AMPLITUDE: 0.5 V
MDC IDC MSMT LEADCHNL RV PACING THRESHOLD PULSEWIDTH: 0.4 ms
MDC IDC SESS DTM: 20160509111400
MDC IDC SET LEADCHNL RA PACING AMPLITUDE: 1.5 V

## 2015-01-08 ENCOUNTER — Encounter (HOSPITAL_COMMUNITY)
Admission: RE | Admit: 2015-01-08 | Discharge: 2015-01-08 | Disposition: A | Discharge status: Home / Self Care | Payer: Self-pay | Source: Ambulatory Visit | Attending: Internal Medicine | Admitting: Internal Medicine

## 2015-01-11 ENCOUNTER — Encounter (HOSPITAL_COMMUNITY)
Admission: RE | Admit: 2015-01-11 | Discharge: 2015-01-11 | Disposition: A | Discharge status: Home / Self Care | Payer: Self-pay | Source: Ambulatory Visit | Attending: Internal Medicine | Admitting: Internal Medicine

## 2015-01-13 ENCOUNTER — Encounter (HOSPITAL_COMMUNITY)
Admission: RE | Admit: 2015-01-13 | Discharge: 2015-01-13 | Disposition: A | Discharge status: Home / Self Care | Payer: Self-pay | Source: Ambulatory Visit | Attending: Internal Medicine | Admitting: Internal Medicine

## 2015-01-15 ENCOUNTER — Encounter (HOSPITAL_COMMUNITY): Payer: Self-pay

## 2015-01-18 ENCOUNTER — Encounter: Payer: Self-pay | Admitting: Cardiology

## 2015-01-18 ENCOUNTER — Encounter (HOSPITAL_COMMUNITY): Payer: Self-pay

## 2015-01-20 ENCOUNTER — Encounter (HOSPITAL_COMMUNITY): Payer: Self-pay

## 2015-01-21 ENCOUNTER — Encounter: Payer: Self-pay | Admitting: Internal Medicine

## 2015-01-22 ENCOUNTER — Encounter (HOSPITAL_COMMUNITY)
Admission: RE | Admit: 2015-01-22 | Discharge: 2015-01-22 | Disposition: A | Discharge status: Home / Self Care | Payer: Self-pay | Source: Ambulatory Visit | Attending: Internal Medicine | Admitting: Internal Medicine

## 2015-01-27 ENCOUNTER — Encounter (HOSPITAL_COMMUNITY)
Admission: RE | Admit: 2015-01-27 | Discharge: 2015-01-27 | Disposition: A | Discharge status: Home / Self Care | Payer: Self-pay | Source: Ambulatory Visit | Attending: Internal Medicine | Admitting: Internal Medicine

## 2015-01-27 DIAGNOSIS — Z952 Presence of prosthetic heart valve: Secondary | ICD-10-CM | POA: Insufficient documentation

## 2015-01-27 DIAGNOSIS — Z950 Presence of cardiac pacemaker: Secondary | ICD-10-CM | POA: Insufficient documentation

## 2015-01-27 DIAGNOSIS — E785 Hyperlipidemia, unspecified: Secondary | ICD-10-CM | POA: Insufficient documentation

## 2015-01-27 DIAGNOSIS — Z5189 Encounter for other specified aftercare: Secondary | ICD-10-CM | POA: Insufficient documentation

## 2015-01-27 DIAGNOSIS — I5032 Chronic diastolic (congestive) heart failure: Secondary | ICD-10-CM | POA: Insufficient documentation

## 2015-01-29 ENCOUNTER — Encounter (HOSPITAL_COMMUNITY)
Admission: RE | Admit: 2015-01-29 | Discharge: 2015-01-29 | Disposition: A | Discharge status: Home / Self Care | Payer: Self-pay | Source: Ambulatory Visit | Attending: Internal Medicine | Admitting: Internal Medicine

## 2015-02-01 ENCOUNTER — Encounter (HOSPITAL_COMMUNITY)
Admission: RE | Admit: 2015-02-01 | Discharge: 2015-02-01 | Disposition: A | Discharge status: Home / Self Care | Payer: Self-pay | Source: Ambulatory Visit | Attending: Internal Medicine | Admitting: Internal Medicine

## 2015-02-03 ENCOUNTER — Encounter (HOSPITAL_COMMUNITY)
Admission: RE | Admit: 2015-02-03 | Discharge: 2015-02-03 | Disposition: A | Discharge status: Home / Self Care | Payer: Self-pay | Source: Ambulatory Visit | Attending: Internal Medicine | Admitting: Internal Medicine

## 2015-02-05 ENCOUNTER — Encounter (HOSPITAL_COMMUNITY)
Admission: RE | Admit: 2015-02-05 | Discharge: 2015-02-05 | Disposition: A | Discharge status: Home / Self Care | Payer: Self-pay | Source: Ambulatory Visit | Attending: Internal Medicine | Admitting: Internal Medicine

## 2015-02-08 ENCOUNTER — Encounter (HOSPITAL_COMMUNITY)
Admission: RE | Admit: 2015-02-08 | Discharge: 2015-02-08 | Disposition: A | Discharge status: Home / Self Care | Payer: Self-pay | Source: Ambulatory Visit | Attending: Internal Medicine | Admitting: Internal Medicine

## 2015-02-10 ENCOUNTER — Encounter (HOSPITAL_COMMUNITY)
Admission: RE | Admit: 2015-02-10 | Discharge: 2015-02-10 | Disposition: A | Discharge status: Home / Self Care | Payer: Self-pay | Source: Ambulatory Visit | Attending: Internal Medicine | Admitting: Internal Medicine

## 2015-02-12 ENCOUNTER — Encounter (HOSPITAL_COMMUNITY)
Admission: RE | Admit: 2015-02-12 | Discharge: 2015-02-12 | Disposition: A | Discharge status: Home / Self Care | Payer: Self-pay | Source: Ambulatory Visit | Attending: Internal Medicine | Admitting: Internal Medicine

## 2015-02-15 ENCOUNTER — Encounter (HOSPITAL_COMMUNITY)
Admission: RE | Admit: 2015-02-15 | Discharge: 2015-02-15 | Disposition: A | Discharge status: Home / Self Care | Payer: Self-pay | Source: Ambulatory Visit | Attending: Internal Medicine | Admitting: Internal Medicine

## 2015-02-17 ENCOUNTER — Other Ambulatory Visit (HOSPITAL_COMMUNITY): Payer: Self-pay | Admitting: Internal Medicine

## 2015-02-17 ENCOUNTER — Encounter (HOSPITAL_COMMUNITY)
Admission: RE | Admit: 2015-02-17 | Discharge: 2015-02-17 | Disposition: A | Discharge status: Home / Self Care | Payer: Self-pay | Source: Ambulatory Visit | Attending: Internal Medicine | Admitting: Internal Medicine

## 2015-02-19 ENCOUNTER — Encounter (HOSPITAL_COMMUNITY)
Admission: RE | Admit: 2015-02-19 | Discharge: 2015-02-19 | Disposition: A | Discharge status: Home / Self Care | Payer: Self-pay | Source: Ambulatory Visit | Attending: Internal Medicine | Admitting: Internal Medicine

## 2015-02-22 ENCOUNTER — Other Ambulatory Visit: Payer: Self-pay

## 2015-02-22 ENCOUNTER — Encounter (HOSPITAL_COMMUNITY)
Admission: RE | Admit: 2015-02-22 | Discharge: 2015-02-22 | Disposition: A | Discharge status: Home / Self Care | Payer: Self-pay | Source: Ambulatory Visit | Attending: Internal Medicine | Admitting: Internal Medicine

## 2015-02-24 ENCOUNTER — Encounter (HOSPITAL_COMMUNITY)
Admission: RE | Admit: 2015-02-24 | Discharge: 2015-02-24 | Disposition: A | Discharge status: Home / Self Care | Payer: Self-pay | Source: Ambulatory Visit | Attending: Internal Medicine | Admitting: Internal Medicine

## 2015-02-26 ENCOUNTER — Encounter (HOSPITAL_COMMUNITY)
Admission: RE | Admit: 2015-02-26 | Discharge: 2015-02-26 | Disposition: A | Discharge status: Home / Self Care | Payer: Self-pay | Source: Ambulatory Visit | Attending: Internal Medicine | Admitting: Internal Medicine

## 2015-02-26 DIAGNOSIS — Z950 Presence of cardiac pacemaker: Secondary | ICD-10-CM | POA: Insufficient documentation

## 2015-02-26 DIAGNOSIS — Z5189 Encounter for other specified aftercare: Secondary | ICD-10-CM | POA: Insufficient documentation

## 2015-02-26 DIAGNOSIS — Z952 Presence of prosthetic heart valve: Secondary | ICD-10-CM | POA: Insufficient documentation

## 2015-02-26 DIAGNOSIS — I5032 Chronic diastolic (congestive) heart failure: Secondary | ICD-10-CM | POA: Insufficient documentation

## 2015-02-26 DIAGNOSIS — E785 Hyperlipidemia, unspecified: Secondary | ICD-10-CM | POA: Insufficient documentation

## 2015-03-03 ENCOUNTER — Encounter (HOSPITAL_COMMUNITY)
Admission: RE | Admit: 2015-03-03 | Discharge: 2015-03-03 | Disposition: A | Discharge status: Home / Self Care | Payer: Self-pay | Source: Ambulatory Visit | Attending: Internal Medicine | Admitting: Internal Medicine

## 2015-03-05 ENCOUNTER — Encounter (HOSPITAL_COMMUNITY)
Admission: RE | Admit: 2015-03-05 | Discharge: 2015-03-05 | Disposition: A | Discharge status: Home / Self Care | Payer: Self-pay | Source: Ambulatory Visit | Attending: Internal Medicine | Admitting: Internal Medicine

## 2015-03-08 ENCOUNTER — Encounter (HOSPITAL_COMMUNITY)
Admission: RE | Admit: 2015-03-08 | Discharge: 2015-03-08 | Disposition: A | Discharge status: Home / Self Care | Payer: Self-pay | Source: Ambulatory Visit | Attending: Internal Medicine | Admitting: Internal Medicine

## 2015-03-10 ENCOUNTER — Encounter (HOSPITAL_COMMUNITY)
Admission: RE | Admit: 2015-03-10 | Discharge: 2015-03-10 | Disposition: A | Discharge status: Home / Self Care | Payer: Self-pay | Source: Ambulatory Visit | Attending: Internal Medicine | Admitting: Internal Medicine

## 2015-03-12 ENCOUNTER — Encounter (HOSPITAL_COMMUNITY)
Admission: RE | Admit: 2015-03-12 | Discharge: 2015-03-12 | Disposition: A | Discharge status: Home / Self Care | Payer: Self-pay | Source: Ambulatory Visit | Attending: Internal Medicine | Admitting: Internal Medicine

## 2015-03-15 ENCOUNTER — Encounter (HOSPITAL_COMMUNITY): Payer: Self-pay

## 2015-03-17 ENCOUNTER — Encounter (HOSPITAL_COMMUNITY): Payer: Self-pay

## 2015-03-19 ENCOUNTER — Encounter (HOSPITAL_COMMUNITY)
Admission: RE | Admit: 2015-03-19 | Discharge: 2015-03-19 | Disposition: A | Discharge status: Home / Self Care | Payer: Self-pay | Source: Ambulatory Visit | Attending: Internal Medicine | Admitting: Internal Medicine

## 2015-03-22 ENCOUNTER — Encounter (HOSPITAL_COMMUNITY)
Admission: RE | Admit: 2015-03-22 | Discharge: 2015-03-22 | Disposition: A | Discharge status: Home / Self Care | Payer: Self-pay | Source: Ambulatory Visit | Attending: Internal Medicine | Admitting: Internal Medicine

## 2015-03-24 ENCOUNTER — Encounter (HOSPITAL_COMMUNITY)
Admission: RE | Admit: 2015-03-24 | Discharge: 2015-03-24 | Disposition: A | Discharge status: Home / Self Care | Payer: Self-pay | Source: Ambulatory Visit | Attending: Internal Medicine | Admitting: Internal Medicine

## 2015-03-26 ENCOUNTER — Encounter: Payer: Self-pay | Admitting: Internal Medicine

## 2015-03-26 ENCOUNTER — Ambulatory Visit (INDEPENDENT_AMBULATORY_CARE_PROVIDER_SITE_OTHER): Payer: Medicare Other | Admitting: Internal Medicine

## 2015-03-26 ENCOUNTER — Encounter (HOSPITAL_COMMUNITY)
Admission: RE | Admit: 2015-03-26 | Discharge: 2015-03-26 | Disposition: A | Discharge status: Home / Self Care | Payer: Self-pay | Source: Ambulatory Visit | Attending: Internal Medicine | Admitting: Internal Medicine

## 2015-03-26 VITALS — BP 126/64 | HR 66 | Ht 63.0 in | Wt 126.2 lb

## 2015-03-26 DIAGNOSIS — Z950 Presence of cardiac pacemaker: Secondary | ICD-10-CM | POA: Diagnosis not present

## 2015-03-26 DIAGNOSIS — I442 Atrioventricular block, complete: Secondary | ICD-10-CM | POA: Diagnosis not present

## 2015-03-26 LAB — CUP PACEART INCLINIC DEVICE CHECK
Battery Impedance: 133 Ohm
Battery Remaining Longevity: 143 mo
Battery Voltage: 2.79 V
Brady Statistic AP VP Percent: 10 %
Brady Statistic AP VS Percent: 0 %
Brady Statistic AS VP Percent: 90 %
Brady Statistic AS VS Percent: 0 %
Date Time Interrogation Session: 20160729174042
Lead Channel Impedance Value: 495 Ohm
Lead Channel Pacing Threshold Amplitude: 0.5 V
Lead Channel Pacing Threshold Pulse Width: 0.4 ms
Lead Channel Setting Pacing Amplitude: 1.5 V
Lead Channel Setting Pacing Pulse Width: 0.4 ms
Lead Channel Setting Sensing Sensitivity: 2 mV
MDC IDC MSMT LEADCHNL RA PACING THRESHOLD AMPLITUDE: 0.75 V
MDC IDC MSMT LEADCHNL RA SENSING INTR AMPL: 8 mV
MDC IDC MSMT LEADCHNL RV IMPEDANCE VALUE: 502 Ohm
MDC IDC MSMT LEADCHNL RV PACING THRESHOLD PULSEWIDTH: 0.4 ms
MDC IDC SET LEADCHNL RV PACING AMPLITUDE: 2 V

## 2015-03-26 NOTE — Patient Instructions (Addendum)
Medication Instructions:  Your physician has recommended you make the following change in your medication:  1) Decrease Lasix to one 40 mg tablet, by mouth each morning.    Labwork: none  Testing/Procedures: none  Follow-Up: Remote monitoring is used to monitor your Pacemaker or ICD from home. This monitoring reduces the number of office visits required to check your device to one time per year. It allows [place] to keep an eye on the functioning of your device to ensure it is working properly. You are scheduled for a device check from home on 06/28/2015. You may send your transmission at any time that day. If you have a wireless device, the transmission will be sent automatically. After your physician reviews your transmission, you will receive a postcard with your next transmission date.  Your physician wants you to follow-up in: 12 months with Tyler Marshall, NP. You will receive a reminder letter in the mail two months in advance. If you don't receive a letter, please call our office to schedule the follow-up appointment.     Any Other Special Instructions Will Be Listed Below (If Applicable).

## 2015-03-26 NOTE — Progress Notes (Signed)
Patient Care Team: Jonathon Jordan, MD as PCP - General (Family Medicine) Alessandra Grout, MD as Referring Physician (Family Medicine) Jolaine Artist, MD as Attending Physician (Cardiology) Leslie Andrea, MD as Referring Physician (Internal Medicine)   HPI  [name] is a 74 y.o. male Seen for pacemaker followup.  Underwent PPM implantation 6/14 for CHB following AVR  Ejection fraction postoperatively an ejection fraction 55-60%  He has an asymmetric edema right greater than left. He takes diuretics daily. He has no significant CP or sob  Sodium intake is brisk as is his fluid intake     Past Medical History  Diagnosis Date  . Aortic stenosis     s/p AVR 6/14  . Exposure to secondhand smoke     extensive exposure  . Hyperlipidemia     treated  . Depression   . Asthma   . Heart murmur   . LBBB (left bundle branch block)   . Shortness of breath     exertion  . DM2 (diabetes mellitus, type 2)     fasting blood sugar - 90s  . Dizziness   . Testicular cancer     s/p XRT and surgical resection.  . Chronic lymphocytic leukemia     followed by Dr. Ouida Sills  . Atrioventricular block, complete   . Pacemaker     Past Surgical History  Procedure Laterality Date  . Xrt and surgical resection      s/p. 10 years ago  . Stapendectomy  07/30/02    right  . Inner ear surgery    . Cyst removal neck    . Aortic valve replacement N/A 02/17/2013    Procedure: AORTIC VALVE REPLACEMENT (AVR);  Surgeon: Gaye Pollack, MD;  Location: [place];  Service: Open Heart Surgery;  Laterality: N/A;  . Intraoperative transesophageal echocardiogram N/A 02/17/2013    Procedure: INTRAOPERATIVE TRANSESOPHAGEAL ECHOCARDIOGRAM;  Surgeon: Gaye Pollack, MD;  Location: [facility] OR;  Service: Open Heart Surgery;  Laterality: N/A;  . Left and right heart catheterization with coronary angiogram N/A 12/25/2012    Procedure: LEFT AND RIGHT HEART CATHETERIZATION WITH CORONARY ANGIOGRAM;  Surgeon:  Jolaine Artist, MD;  Location: [facility] CATH LAB;  Service: Cardiovascular;  Laterality: N/A;  . Permanent pacemaker insertion N/A 02/19/2013    Procedure: PERMANENT PACEMAKER INSERTION;  Surgeon: Deboraha Sprang, MD;  Location: [facility] CATH LAB;  Service: Cardiovascular;  Laterality: N/A;    Current Outpatient Prescriptions  Medication Sig Dispense Refill  . acetaminophen (TYLENOL) 500 MG tablet Take 500 mg by mouth 3 (three) times daily.     Marland Kitchen aspirin 81 MG tablet Take 1 tablet (81 mg total) by mouth daily. 30 tablet 11  . atorvastatin (LIPITOR) 40 MG tablet Take 40 mg by mouth daily.    . bifidobacterium infantis (ALIGN) capsule Take 1 capsule by mouth daily.    . busPIRone (BUSPAR) 15 MG tablet Take by mouth. One tablet by mouth every morning and two tablets by mouth every day at noon    . cholecalciferol (VITAMIN D) 1000 UNITS tablet Take 1,000 Units by mouth daily.    . Cholestyramine POWD Take 1 scoop by mouth 2 (two) times daily.     . cycloSPORINE (RESTASIS) 0.05 % ophthalmic emulsion Place 1 drop into both eyes 2 (two) times daily.     Marland Kitchen desloratadine (CLARINEX) 5 MG tablet Take 5 mg by mouth daily.     . fenofibrate 160 MG tablet Take 160 mg  by mouth daily.     . fluticasone (FLONASE) 50 MCG/ACT nasal spray Place 2 sprays into the nose daily.     Marland Kitchen FORA V12 BLOOD GLUCOSE TEST test strip     . furosemide (LASIX) 40 MG tablet TAKE 1 TAB BY MOUTH IN THE AM & 1/2 TAB BY MOUTH IN THE PM, MAY TAKE AN ADDITIONAL 1/2 TAB BY MOUTH IF NEEDED FOR EDEMA.    Marland Kitchen gabapentin (NEURONTIN) 300 MG capsule TAKE 1 CAP BY MOUTH IN THE AM & 2 CAPS BY MOUTH @ 12 PM    . LITETOUCH LANCETS MISC     . Multiple Vitamin (MULTIVITAMIN) tablet Take 1 tablet by mouth daily.     Vladimir Faster Glycol-Propyl Glycol (SYSTANE OP) Place 1 drop into both eyes 2 (two) times daily.    . potassium chloride SA (K-DUR,KLOR-CON) 20 MEQ tablet Take 20 mEq by mouth once.    Marland Kitchen PROCTOZONE-HC 2.5 % rectal cream Place 1 application rectally 2  (two) times daily as needed.     . sertraline (ZOLOFT) 100 MG tablet Take 100 mg by mouth daily.     . traZODone (DESYREL) 50 MG tablet Take 150 mg by mouth at bedtime.      No current facility-administered medications for this visit.    Allergies  Allergen Reactions  . Banana Anaphylaxis  . Penicillins Rash    Review of Systems negative except from HPI and PMH  Physical Exam BP 126/64 mmHg  Pulse 66  Ht 5\' 3"  (1.6 m)  Wt 126 lb 3.2 oz (57.244 kg)  BMI 22.36 kg/m2  Well developed and well nourished in no acute distress HENT normal E scleral and icterus clear Neck Supple JVP flat; carotids brisk and full Clear to ausculation  Device pocket well healed; without hematoma or erythema.  There is no tethering  regular rate and rhythm, early systolic murmur Soft with active bowel sounds No clubbing cyanosis 2>1  Edema Alert and oriented, grossly normal motor and sensory function Skin Warm and Dry  ECG demonstrates P. synchronous pacing Assessment and  Plan     Complete heart block  Pacemaker -Medtronic  HFpEF  Asymmetric edema Device function is normal.    We discussed extensively the physiology of fluid retention and relation sodium and fluid intake. We will try and have him discontinue his nocturnal dose of diuretics and manage his volume status and decreasing by mouth intake. We will monitor his weight and increase his diuretics necessary.  We spent more than 50% of our >25 min visit in face to face counseling regarding the above

## 2015-03-29 ENCOUNTER — Encounter (HOSPITAL_COMMUNITY)
Admission: RE | Admit: 2015-03-29 | Discharge: 2015-03-29 | Disposition: A | Discharge status: Home / Self Care | Payer: Self-pay | Source: Ambulatory Visit | Attending: Internal Medicine | Admitting: Internal Medicine

## 2015-03-29 DIAGNOSIS — Z5189 Encounter for other specified aftercare: Secondary | ICD-10-CM | POA: Insufficient documentation

## 2015-03-29 DIAGNOSIS — Z952 Presence of prosthetic heart valve: Secondary | ICD-10-CM | POA: Insufficient documentation

## 2015-03-29 DIAGNOSIS — Z950 Presence of cardiac pacemaker: Secondary | ICD-10-CM | POA: Insufficient documentation

## 2015-03-29 DIAGNOSIS — I5032 Chronic diastolic (congestive) heart failure: Secondary | ICD-10-CM | POA: Insufficient documentation

## 2015-03-29 DIAGNOSIS — E785 Hyperlipidemia, unspecified: Secondary | ICD-10-CM | POA: Insufficient documentation

## 2015-03-31 ENCOUNTER — Encounter (HOSPITAL_COMMUNITY)
Admission: RE | Admit: 2015-03-31 | Discharge: 2015-03-31 | Disposition: A | Discharge status: Home / Self Care | Payer: Self-pay | Source: Ambulatory Visit | Attending: Internal Medicine | Admitting: Internal Medicine

## 2015-04-02 ENCOUNTER — Encounter (HOSPITAL_COMMUNITY)
Admission: RE | Admit: 2015-04-02 | Discharge: 2015-04-02 | Disposition: A | Discharge status: Home / Self Care | Payer: Self-pay | Source: Ambulatory Visit | Attending: Internal Medicine | Admitting: Internal Medicine

## 2015-04-05 ENCOUNTER — Encounter (HOSPITAL_COMMUNITY)
Admission: RE | Admit: 2015-04-05 | Discharge: 2015-04-05 | Disposition: A | Discharge status: Home / Self Care | Payer: Self-pay | Source: Ambulatory Visit | Attending: Internal Medicine | Admitting: Internal Medicine

## 2015-04-07 ENCOUNTER — Encounter (HOSPITAL_COMMUNITY)
Admission: RE | Admit: 2015-04-07 | Discharge: 2015-04-07 | Disposition: A | Discharge status: Home / Self Care | Payer: Self-pay | Source: Ambulatory Visit | Attending: Internal Medicine | Admitting: Internal Medicine

## 2015-04-09 ENCOUNTER — Encounter (HOSPITAL_COMMUNITY)
Admission: RE | Admit: 2015-04-09 | Discharge: 2015-04-09 | Disposition: A | Discharge status: Home / Self Care | Payer: Self-pay | Source: Ambulatory Visit | Attending: Internal Medicine | Admitting: Internal Medicine

## 2015-04-12 ENCOUNTER — Encounter (HOSPITAL_COMMUNITY)
Admission: RE | Admit: 2015-04-12 | Discharge: 2015-04-12 | Disposition: A | Discharge status: Home / Self Care | Payer: Self-pay | Source: Ambulatory Visit | Attending: Internal Medicine | Admitting: Internal Medicine

## 2015-04-14 ENCOUNTER — Encounter (HOSPITAL_COMMUNITY)
Admission: RE | Admit: 2015-04-14 | Discharge: 2015-04-14 | Disposition: A | Discharge status: Home / Self Care | Payer: Self-pay | Source: Ambulatory Visit | Attending: Internal Medicine | Admitting: Internal Medicine

## 2015-04-16 ENCOUNTER — Encounter (HOSPITAL_COMMUNITY)
Admission: RE | Admit: 2015-04-16 | Discharge: 2015-04-16 | Disposition: A | Discharge status: Home / Self Care | Payer: Self-pay | Source: Ambulatory Visit | Attending: Internal Medicine | Admitting: Internal Medicine

## 2015-04-19 ENCOUNTER — Encounter (HOSPITAL_COMMUNITY)
Admission: RE | Admit: 2015-04-19 | Discharge: 2015-04-19 | Disposition: A | Discharge status: Home / Self Care | Payer: Self-pay | Source: Ambulatory Visit | Attending: Internal Medicine | Admitting: Internal Medicine

## 2015-04-21 ENCOUNTER — Encounter (HOSPITAL_COMMUNITY)
Admission: RE | Admit: 2015-04-21 | Discharge: 2015-04-21 | Disposition: A | Discharge status: Home / Self Care | Payer: Self-pay | Source: Ambulatory Visit | Attending: Internal Medicine | Admitting: Internal Medicine

## 2015-04-23 ENCOUNTER — Encounter (HOSPITAL_COMMUNITY)
Admission: RE | Admit: 2015-04-23 | Discharge: 2015-04-23 | Disposition: A | Discharge status: Home / Self Care | Payer: Self-pay | Source: Ambulatory Visit | Attending: Internal Medicine | Admitting: Internal Medicine

## 2015-04-26 ENCOUNTER — Encounter (HOSPITAL_COMMUNITY)
Admission: RE | Admit: 2015-04-26 | Discharge: 2015-04-26 | Disposition: A | Discharge status: Home / Self Care | Payer: Self-pay | Source: Ambulatory Visit | Attending: Internal Medicine | Admitting: Internal Medicine

## 2015-04-28 ENCOUNTER — Encounter (HOSPITAL_COMMUNITY)
Admission: RE | Admit: 2015-04-28 | Discharge: 2015-04-28 | Disposition: A | Discharge status: Home / Self Care | Payer: Self-pay | Source: Ambulatory Visit | Attending: Internal Medicine | Admitting: Internal Medicine

## 2015-04-30 ENCOUNTER — Encounter (HOSPITAL_COMMUNITY): Payer: Medicare Other

## 2015-04-30 DIAGNOSIS — Z5189 Encounter for other specified aftercare: Secondary | ICD-10-CM | POA: Insufficient documentation

## 2015-04-30 DIAGNOSIS — Z950 Presence of cardiac pacemaker: Secondary | ICD-10-CM | POA: Insufficient documentation

## 2015-04-30 DIAGNOSIS — Z952 Presence of prosthetic heart valve: Secondary | ICD-10-CM | POA: Insufficient documentation

## 2015-04-30 DIAGNOSIS — I5032 Chronic diastolic (congestive) heart failure: Secondary | ICD-10-CM | POA: Insufficient documentation

## 2015-04-30 DIAGNOSIS — E785 Hyperlipidemia, unspecified: Secondary | ICD-10-CM | POA: Insufficient documentation

## 2015-05-05 ENCOUNTER — Encounter (HOSPITAL_COMMUNITY)
Admission: RE | Admit: 2015-05-05 | Discharge: 2015-05-05 | Disposition: A | Discharge status: Home / Self Care | Payer: Self-pay | Source: Ambulatory Visit | Attending: Internal Medicine | Admitting: Internal Medicine

## 2015-05-07 ENCOUNTER — Encounter (HOSPITAL_COMMUNITY): Payer: Self-pay

## 2015-05-10 ENCOUNTER — Encounter (HOSPITAL_COMMUNITY)
Admission: RE | Admit: 2015-05-10 | Discharge: 2015-05-10 | Disposition: A | Discharge status: Home / Self Care | Payer: Self-pay | Source: Ambulatory Visit | Attending: Internal Medicine | Admitting: Internal Medicine

## 2015-05-12 ENCOUNTER — Encounter (HOSPITAL_COMMUNITY)
Admission: RE | Admit: 2015-05-12 | Discharge: 2015-05-12 | Disposition: A | Discharge status: Home / Self Care | Payer: Self-pay | Source: Ambulatory Visit | Attending: Internal Medicine | Admitting: Internal Medicine

## 2015-05-13 DIAGNOSIS — D2262 Melanocytic nevi of left upper limb, including shoulder: Secondary | ICD-10-CM | POA: Diagnosis not present

## 2015-05-13 DIAGNOSIS — D225 Melanocytic nevi of trunk: Secondary | ICD-10-CM | POA: Diagnosis not present

## 2015-05-13 DIAGNOSIS — L821 Other seborrheic keratosis: Secondary | ICD-10-CM | POA: Diagnosis not present

## 2015-05-13 DIAGNOSIS — D2261 Melanocytic nevi of right upper limb, including shoulder: Secondary | ICD-10-CM | POA: Diagnosis not present

## 2015-05-13 DIAGNOSIS — Z85828 Personal history of other malignant neoplasm of skin: Secondary | ICD-10-CM | POA: Diagnosis not present

## 2015-05-13 DIAGNOSIS — L570 Actinic keratosis: Secondary | ICD-10-CM | POA: Diagnosis not present

## 2015-05-13 DIAGNOSIS — D1801 Hemangioma of skin and subcutaneous tissue: Secondary | ICD-10-CM | POA: Diagnosis not present

## 2015-05-13 DIAGNOSIS — L720 Epidermal cyst: Secondary | ICD-10-CM | POA: Diagnosis not present

## 2015-05-14 ENCOUNTER — Encounter (HOSPITAL_COMMUNITY)
Admission: RE | Admit: 2015-05-14 | Discharge: 2015-05-14 | Disposition: A | Discharge status: Home / Self Care | Payer: Self-pay | Source: Ambulatory Visit | Attending: Internal Medicine | Admitting: Internal Medicine

## 2015-05-17 ENCOUNTER — Encounter (HOSPITAL_COMMUNITY): Payer: Self-pay

## 2015-05-19 ENCOUNTER — Encounter (HOSPITAL_COMMUNITY): Payer: Self-pay

## 2015-05-21 ENCOUNTER — Encounter (HOSPITAL_COMMUNITY)
Admission: RE | Admit: 2015-05-21 | Discharge: 2015-05-21 | Disposition: A | Discharge status: Home / Self Care | Payer: Self-pay | Source: Ambulatory Visit | Attending: Internal Medicine | Admitting: Internal Medicine

## 2015-05-24 ENCOUNTER — Encounter (HOSPITAL_COMMUNITY)
Admission: RE | Admit: 2015-05-24 | Discharge: 2015-05-24 | Disposition: A | Discharge status: Home / Self Care | Payer: Self-pay | Source: Ambulatory Visit | Attending: Internal Medicine | Admitting: Internal Medicine

## 2015-05-26 ENCOUNTER — Encounter (HOSPITAL_COMMUNITY)
Admission: RE | Admit: 2015-05-26 | Discharge: 2015-05-26 | Disposition: A | Discharge status: Home / Self Care | Payer: Self-pay | Source: Ambulatory Visit | Attending: Internal Medicine | Admitting: Internal Medicine

## 2015-05-28 ENCOUNTER — Encounter (HOSPITAL_COMMUNITY): Payer: Self-pay

## 2015-05-31 ENCOUNTER — Encounter (HOSPITAL_COMMUNITY)
Admission: RE | Admit: 2015-05-31 | Discharge: 2015-05-31 | Disposition: A | Discharge status: Home / Self Care | Payer: Self-pay | Source: Ambulatory Visit | Attending: Internal Medicine | Admitting: Internal Medicine

## 2015-05-31 DIAGNOSIS — I5032 Chronic diastolic (congestive) heart failure: Secondary | ICD-10-CM | POA: Insufficient documentation

## 2015-05-31 DIAGNOSIS — Z5189 Encounter for other specified aftercare: Secondary | ICD-10-CM | POA: Insufficient documentation

## 2015-05-31 DIAGNOSIS — Z950 Presence of cardiac pacemaker: Secondary | ICD-10-CM | POA: Insufficient documentation

## 2015-05-31 DIAGNOSIS — Z952 Presence of prosthetic heart valve: Secondary | ICD-10-CM | POA: Insufficient documentation

## 2015-05-31 DIAGNOSIS — E785 Hyperlipidemia, unspecified: Secondary | ICD-10-CM | POA: Insufficient documentation

## 2015-06-02 ENCOUNTER — Encounter (HOSPITAL_COMMUNITY)
Admission: RE | Admit: 2015-06-02 | Discharge: 2015-06-02 | Disposition: A | Discharge status: Home / Self Care | Payer: Self-pay | Source: Ambulatory Visit | Attending: Internal Medicine | Admitting: Internal Medicine

## 2015-06-04 ENCOUNTER — Encounter (HOSPITAL_COMMUNITY)
Admission: RE | Admit: 2015-06-04 | Discharge: 2015-06-04 | Disposition: A | Discharge status: Home / Self Care | Payer: Self-pay | Source: Ambulatory Visit | Attending: Internal Medicine | Admitting: Internal Medicine

## 2015-06-07 ENCOUNTER — Encounter (HOSPITAL_COMMUNITY)
Admission: RE | Admit: 2015-06-07 | Discharge: 2015-06-07 | Disposition: A | Discharge status: Home / Self Care | Payer: Self-pay | Source: Ambulatory Visit | Attending: Internal Medicine | Admitting: Internal Medicine

## 2015-06-09 ENCOUNTER — Encounter (HOSPITAL_COMMUNITY)
Admission: RE | Admit: 2015-06-09 | Discharge: 2015-06-09 | Disposition: A | Discharge status: Home / Self Care | Payer: Self-pay | Source: Ambulatory Visit | Attending: Internal Medicine | Admitting: Internal Medicine

## 2015-06-11 ENCOUNTER — Encounter (HOSPITAL_COMMUNITY)
Admission: RE | Admit: 2015-06-11 | Discharge: 2015-06-11 | Disposition: A | Discharge status: Home / Self Care | Payer: Self-pay | Source: Ambulatory Visit | Attending: Internal Medicine | Admitting: Internal Medicine

## 2015-06-14 ENCOUNTER — Encounter (HOSPITAL_COMMUNITY): Payer: Self-pay

## 2015-06-16 ENCOUNTER — Encounter (HOSPITAL_COMMUNITY)
Admission: RE | Admit: 2015-06-16 | Discharge: 2015-06-16 | Disposition: A | Discharge status: Home / Self Care | Payer: Self-pay | Source: Ambulatory Visit | Attending: Internal Medicine | Admitting: Internal Medicine

## 2015-06-18 ENCOUNTER — Encounter (HOSPITAL_COMMUNITY)
Admission: RE | Admit: 2015-06-18 | Discharge: 2015-06-18 | Disposition: A | Discharge status: Home / Self Care | Payer: Self-pay | Source: Ambulatory Visit | Attending: Internal Medicine | Admitting: Internal Medicine

## 2015-06-19 ENCOUNTER — Other Ambulatory Visit (HOSPITAL_COMMUNITY): Payer: Self-pay | Admitting: Internal Medicine

## 2015-06-21 ENCOUNTER — Encounter (HOSPITAL_COMMUNITY)
Admission: RE | Admit: 2015-06-21 | Discharge: 2015-06-21 | Disposition: A | Discharge status: Home / Self Care | Payer: Self-pay | Source: Ambulatory Visit | Attending: Internal Medicine | Admitting: Internal Medicine

## 2015-06-23 ENCOUNTER — Encounter (HOSPITAL_COMMUNITY)
Admission: RE | Admit: 2015-06-23 | Discharge: 2015-06-23 | Disposition: A | Discharge status: Home / Self Care | Payer: Self-pay | Source: Ambulatory Visit | Attending: Internal Medicine | Admitting: Internal Medicine

## 2015-06-25 ENCOUNTER — Encounter (HOSPITAL_COMMUNITY)
Admission: RE | Admit: 2015-06-25 | Discharge: 2015-06-25 | Disposition: A | Discharge status: Home / Self Care | Payer: Self-pay | Source: Ambulatory Visit | Attending: Internal Medicine | Admitting: Internal Medicine

## 2015-06-28 ENCOUNTER — Encounter (HOSPITAL_COMMUNITY): Payer: Self-pay

## 2015-06-28 ENCOUNTER — Ambulatory Visit (INDEPENDENT_AMBULATORY_CARE_PROVIDER_SITE_OTHER): Payer: Medicare Other | Admitting: *Deleted

## 2015-06-28 DIAGNOSIS — I442 Atrioventricular block, complete: Secondary | ICD-10-CM | POA: Diagnosis not present

## 2015-06-29 NOTE — Progress Notes (Signed)
Remote pacemaker transmission.   

## 2015-06-30 ENCOUNTER — Encounter (HOSPITAL_COMMUNITY): Payer: Self-pay

## 2015-06-30 DIAGNOSIS — Z952 Presence of prosthetic heart valve: Secondary | ICD-10-CM | POA: Insufficient documentation

## 2015-06-30 DIAGNOSIS — Z48812 Encounter for surgical aftercare following surgery on the circulatory system: Secondary | ICD-10-CM | POA: Insufficient documentation

## 2015-06-30 DIAGNOSIS — Z950 Presence of cardiac pacemaker: Secondary | ICD-10-CM | POA: Insufficient documentation

## 2015-07-02 ENCOUNTER — Encounter (HOSPITAL_COMMUNITY)
Admission: RE | Admit: 2015-07-02 | Discharge: 2015-07-02 | Disposition: A | Discharge status: Home / Self Care | Payer: Self-pay | Source: Ambulatory Visit | Attending: Internal Medicine | Admitting: Internal Medicine

## 2015-07-02 ENCOUNTER — Encounter: Payer: Self-pay | Admitting: Cardiology

## 2015-07-02 LAB — CUP PACEART REMOTE DEVICE CHECK
Battery Impedance: 133 Ohm
Battery Remaining Longevity: 143 mo
Battery Voltage: 2.79 V
Brady Statistic AP VS Percent: 0 %
Brady Statistic AS VP Percent: 85 %
Brady Statistic AS VS Percent: 0 %
Date Time Interrogation Session: 20161031123519
Implantable Lead Implant Date: 20140625
Implantable Lead Implant Date: 20140625
Implantable Lead Location: 753859
Implantable Lead Model: 5076
Implantable Lead Model: 5076
Lead Channel Impedance Value: 474 Ohm
Lead Channel Impedance Value: 502 Ohm
Lead Channel Pacing Threshold Amplitude: 0.5 V
Lead Channel Pacing Threshold Amplitude: 0.5 V
Lead Channel Pacing Threshold Pulse Width: 0.4 ms
Lead Channel Pacing Threshold Pulse Width: 0.4 ms
Lead Channel Sensing Intrinsic Amplitude: 2.8 mV
Lead Channel Setting Pacing Amplitude: 1.5 V
Lead Channel Setting Pacing Amplitude: 2 V
Lead Channel Setting Sensing Sensitivity: 2 mV
MDC IDC LEAD LOCATION: 753860
MDC IDC SET LEADCHNL RV PACING PULSEWIDTH: 0.4 ms
MDC IDC STAT BRADY AP VP PERCENT: 15 %

## 2015-07-05 ENCOUNTER — Encounter (HOSPITAL_COMMUNITY)
Admission: RE | Admit: 2015-07-05 | Discharge: 2015-07-05 | Disposition: A | Discharge status: Home / Self Care | Payer: Self-pay | Source: Ambulatory Visit | Attending: Internal Medicine | Admitting: Internal Medicine

## 2015-07-07 ENCOUNTER — Encounter (HOSPITAL_COMMUNITY): Payer: Self-pay

## 2015-07-09 ENCOUNTER — Encounter (HOSPITAL_COMMUNITY)
Admission: RE | Admit: 2015-07-09 | Discharge: 2015-07-09 | Disposition: A | Discharge status: Home / Self Care | Payer: Self-pay | Source: Ambulatory Visit | Attending: Internal Medicine | Admitting: Internal Medicine

## 2015-07-12 ENCOUNTER — Encounter (HOSPITAL_COMMUNITY): Payer: Self-pay

## 2015-07-14 ENCOUNTER — Encounter (HOSPITAL_COMMUNITY)
Admission: RE | Admit: 2015-07-14 | Discharge: 2015-07-14 | Disposition: A | Discharge status: Home / Self Care | Payer: Self-pay | Source: Ambulatory Visit | Attending: Internal Medicine | Admitting: Internal Medicine

## 2015-07-15 DIAGNOSIS — E119 Type 2 diabetes mellitus without complications: Secondary | ICD-10-CM | POA: Diagnosis not present

## 2015-07-15 DIAGNOSIS — H2513 Age-related nuclear cataract, bilateral: Secondary | ICD-10-CM | POA: Diagnosis not present

## 2015-07-15 DIAGNOSIS — H4321 Crystalline deposits in vitreous body, right eye: Secondary | ICD-10-CM | POA: Diagnosis not present

## 2015-07-16 ENCOUNTER — Encounter (HOSPITAL_COMMUNITY): Payer: Self-pay

## 2015-07-19 ENCOUNTER — Encounter (HOSPITAL_COMMUNITY)
Admission: RE | Admit: 2015-07-19 | Discharge: 2015-07-19 | Disposition: A | Discharge status: Home / Self Care | Payer: Self-pay | Source: Ambulatory Visit | Attending: Internal Medicine | Admitting: Internal Medicine

## 2015-07-21 ENCOUNTER — Encounter (HOSPITAL_COMMUNITY): Payer: Self-pay

## 2015-07-26 ENCOUNTER — Encounter (HOSPITAL_COMMUNITY): Payer: Self-pay

## 2015-07-28 ENCOUNTER — Encounter (HOSPITAL_COMMUNITY): Payer: Self-pay

## 2015-07-30 ENCOUNTER — Encounter (HOSPITAL_COMMUNITY)
Admission: RE | Admit: 2015-07-30 | Discharge: 2015-07-30 | Disposition: A | Discharge status: Home / Self Care | Payer: Self-pay | Source: Ambulatory Visit | Attending: Internal Medicine | Admitting: Internal Medicine

## 2015-07-30 DIAGNOSIS — Z48812 Encounter for surgical aftercare following surgery on the circulatory system: Secondary | ICD-10-CM | POA: Insufficient documentation

## 2015-07-30 DIAGNOSIS — Z952 Presence of prosthetic heart valve: Secondary | ICD-10-CM | POA: Insufficient documentation

## 2015-07-30 DIAGNOSIS — Z950 Presence of cardiac pacemaker: Secondary | ICD-10-CM | POA: Insufficient documentation

## 2015-08-02 ENCOUNTER — Encounter (HOSPITAL_COMMUNITY): Payer: Self-pay

## 2015-08-03 ENCOUNTER — Other Ambulatory Visit: Payer: Self-pay | Admitting: Internal Medicine

## 2015-08-03 ENCOUNTER — Other Ambulatory Visit: Payer: Self-pay | Admitting: Nurse Practitioner

## 2015-08-03 DIAGNOSIS — K7689 Other specified diseases of liver: Secondary | ICD-10-CM

## 2015-08-03 DIAGNOSIS — K668 Other specified disorders of peritoneum: Secondary | ICD-10-CM

## 2015-08-04 ENCOUNTER — Encounter (HOSPITAL_COMMUNITY): Payer: Self-pay

## 2015-08-06 ENCOUNTER — Encounter (HOSPITAL_COMMUNITY)
Admission: RE | Admit: 2015-08-06 | Discharge: 2015-08-06 | Disposition: A | Discharge status: Home / Self Care | Payer: Self-pay | Source: Ambulatory Visit | Attending: Internal Medicine | Admitting: Internal Medicine

## 2015-08-09 ENCOUNTER — Encounter (HOSPITAL_COMMUNITY)
Admission: RE | Admit: 2015-08-09 | Discharge: 2015-08-09 | Disposition: A | Discharge status: Home / Self Care | Payer: Self-pay | Source: Ambulatory Visit | Attending: Internal Medicine | Admitting: Internal Medicine

## 2015-08-11 ENCOUNTER — Ambulatory Visit
Admission: RE | Admit: 2015-08-11 | Discharge: 2015-08-11 | Disposition: A | Discharge status: Home / Self Care | Payer: Medicare Other | Source: Ambulatory Visit | Attending: Nurse Practitioner | Admitting: Nurse Practitioner

## 2015-08-11 ENCOUNTER — Encounter (HOSPITAL_COMMUNITY)
Admission: RE | Admit: 2015-08-11 | Discharge: 2015-08-11 | Disposition: A | Discharge status: Home / Self Care | Payer: Self-pay | Source: Ambulatory Visit | Attending: Internal Medicine | Admitting: Internal Medicine

## 2015-08-11 DIAGNOSIS — K668 Other specified disorders of peritoneum: Secondary | ICD-10-CM

## 2015-08-11 DIAGNOSIS — K7689 Other specified diseases of liver: Secondary | ICD-10-CM

## 2015-08-11 MED ORDER — IOPAMIDOL (ISOVUE-300) INJECTION 61%
100.0000 mL | Freq: Once | INTRAVENOUS | Status: AC | PRN
  Administered 2015-08-11: 100 mL via INTRAVENOUS

## 2015-08-13 ENCOUNTER — Encounter (HOSPITAL_COMMUNITY)
Admission: RE | Admit: 2015-08-13 | Discharge: 2015-08-13 | Disposition: A | Discharge status: Home / Self Care | Payer: Self-pay | Source: Ambulatory Visit | Attending: Internal Medicine | Admitting: Internal Medicine

## 2015-08-16 ENCOUNTER — Encounter (HOSPITAL_COMMUNITY): Payer: Self-pay

## 2015-08-17 DIAGNOSIS — Z23 Encounter for immunization: Secondary | ICD-10-CM | POA: Diagnosis not present

## 2015-08-17 DIAGNOSIS — K7689 Other specified diseases of liver: Secondary | ICD-10-CM | POA: Diagnosis not present

## 2015-08-17 DIAGNOSIS — D3A8 Other benign neuroendocrine tumors: Secondary | ICD-10-CM | POA: Diagnosis not present

## 2015-08-17 DIAGNOSIS — R1909 Other intra-abdominal and pelvic swelling, mass and lump: Secondary | ICD-10-CM | POA: Diagnosis not present

## 2015-08-17 DIAGNOSIS — K639 Disease of intestine, unspecified: Secondary | ICD-10-CM | POA: Diagnosis not present

## 2015-08-17 DIAGNOSIS — Z79899 Other long term (current) drug therapy: Secondary | ICD-10-CM | POA: Diagnosis not present

## 2015-08-17 DIAGNOSIS — J45909 Unspecified asthma, uncomplicated: Secondary | ICD-10-CM | POA: Diagnosis not present

## 2015-08-18 ENCOUNTER — Encounter (HOSPITAL_COMMUNITY): Payer: Self-pay

## 2015-08-20 ENCOUNTER — Encounter (HOSPITAL_COMMUNITY)
Admission: RE | Admit: 2015-08-20 | Discharge: 2015-08-20 | Disposition: A | Discharge status: Home / Self Care | Payer: Self-pay | Source: Ambulatory Visit | Attending: Internal Medicine | Admitting: Internal Medicine

## 2015-08-25 ENCOUNTER — Encounter (HOSPITAL_COMMUNITY)
Admission: RE | Admit: 2015-08-25 | Discharge: 2015-08-25 | Disposition: A | Discharge status: Home / Self Care | Payer: Self-pay | Source: Ambulatory Visit | Attending: Internal Medicine | Admitting: Internal Medicine

## 2015-08-27 ENCOUNTER — Encounter (HOSPITAL_COMMUNITY)
Admission: RE | Admit: 2015-08-27 | Discharge: 2015-08-27 | Disposition: A | Discharge status: Home / Self Care | Payer: Self-pay | Source: Ambulatory Visit | Attending: Internal Medicine | Admitting: Internal Medicine

## 2015-09-01 ENCOUNTER — Encounter (HOSPITAL_COMMUNITY)
Admission: RE | Admit: 2015-09-01 | Discharge: 2015-09-01 | Disposition: A | Discharge status: Home / Self Care | Payer: Self-pay | Source: Ambulatory Visit | Attending: Internal Medicine | Admitting: Internal Medicine

## 2015-09-01 DIAGNOSIS — Z952 Presence of prosthetic heart valve: Secondary | ICD-10-CM | POA: Insufficient documentation

## 2015-09-01 DIAGNOSIS — Z48812 Encounter for surgical aftercare following surgery on the circulatory system: Secondary | ICD-10-CM | POA: Insufficient documentation

## 2015-09-01 DIAGNOSIS — Z950 Presence of cardiac pacemaker: Secondary | ICD-10-CM | POA: Insufficient documentation

## 2015-09-03 ENCOUNTER — Encounter (HOSPITAL_COMMUNITY)
Admission: RE | Admit: 2015-09-03 | Discharge: 2015-09-03 | Disposition: A | Discharge status: Home / Self Care | Payer: Self-pay | Source: Ambulatory Visit | Attending: Internal Medicine | Admitting: Internal Medicine

## 2015-09-06 ENCOUNTER — Encounter (HOSPITAL_COMMUNITY)
Admission: RE | Admit: 2015-09-06 | Discharge: 2015-09-06 | Disposition: A | Discharge status: Home / Self Care | Payer: Self-pay | Source: Ambulatory Visit | Attending: Internal Medicine | Admitting: Internal Medicine

## 2015-09-08 ENCOUNTER — Encounter (HOSPITAL_COMMUNITY)
Admission: RE | Admit: 2015-09-08 | Discharge: 2015-09-08 | Disposition: A | Discharge status: Home / Self Care | Payer: Self-pay | Source: Ambulatory Visit | Attending: Internal Medicine | Admitting: Internal Medicine

## 2015-09-10 ENCOUNTER — Encounter (HOSPITAL_COMMUNITY)
Admission: RE | Admit: 2015-09-10 | Discharge: 2015-09-10 | Disposition: A | Discharge status: Home / Self Care | Payer: Self-pay | Source: Ambulatory Visit | Attending: Internal Medicine | Admitting: Internal Medicine

## 2015-09-13 ENCOUNTER — Encounter (HOSPITAL_COMMUNITY)
Admission: RE | Admit: 2015-09-13 | Discharge: 2015-09-13 | Disposition: A | Discharge status: Home / Self Care | Payer: Self-pay | Source: Ambulatory Visit | Attending: Internal Medicine | Admitting: Internal Medicine

## 2015-09-15 ENCOUNTER — Encounter (HOSPITAL_COMMUNITY)
Admission: RE | Admit: 2015-09-15 | Discharge: 2015-09-15 | Disposition: A | Discharge status: Home / Self Care | Payer: Self-pay | Source: Ambulatory Visit | Attending: Internal Medicine | Admitting: Internal Medicine

## 2015-09-17 ENCOUNTER — Encounter (HOSPITAL_COMMUNITY)
Admission: RE | Admit: 2015-09-17 | Discharge: 2015-09-17 | Disposition: A | Discharge status: Home / Self Care | Payer: Self-pay | Source: Ambulatory Visit | Attending: Internal Medicine | Admitting: Internal Medicine

## 2015-09-20 ENCOUNTER — Encounter (HOSPITAL_COMMUNITY)
Admission: RE | Admit: 2015-09-20 | Discharge: 2015-09-20 | Disposition: A | Discharge status: Home / Self Care | Payer: Self-pay | Source: Ambulatory Visit | Attending: Internal Medicine | Admitting: Internal Medicine

## 2015-09-21 DIAGNOSIS — C9110 Chronic lymphocytic leukemia of B-cell type not having achieved remission: Secondary | ICD-10-CM | POA: Diagnosis not present

## 2015-09-21 DIAGNOSIS — E782 Mixed hyperlipidemia: Secondary | ICD-10-CM | POA: Diagnosis not present

## 2015-09-21 DIAGNOSIS — K5730 Diverticulosis of large intestine without perforation or abscess without bleeding: Secondary | ICD-10-CM | POA: Diagnosis not present

## 2015-09-21 DIAGNOSIS — I253 Aneurysm of heart: Secondary | ICD-10-CM | POA: Diagnosis not present

## 2015-09-21 DIAGNOSIS — N400 Enlarged prostate without lower urinary tract symptoms: Secondary | ICD-10-CM | POA: Diagnosis not present

## 2015-09-21 DIAGNOSIS — Z0000 Encounter for general adult medical examination without abnormal findings: Secondary | ICD-10-CM | POA: Diagnosis not present

## 2015-09-21 DIAGNOSIS — N182 Chronic kidney disease, stage 2 (mild): Secondary | ICD-10-CM | POA: Diagnosis not present

## 2015-09-21 DIAGNOSIS — E1122 Type 2 diabetes mellitus with diabetic chronic kidney disease: Secondary | ICD-10-CM | POA: Diagnosis not present

## 2015-09-21 DIAGNOSIS — I350 Nonrheumatic aortic (valve) stenosis: Secondary | ICD-10-CM | POA: Diagnosis not present

## 2015-09-21 DIAGNOSIS — R972 Elevated prostate specific antigen [PSA]: Secondary | ICD-10-CM | POA: Diagnosis not present

## 2015-09-21 DIAGNOSIS — Z79899 Other long term (current) drug therapy: Secondary | ICD-10-CM | POA: Diagnosis not present

## 2015-09-21 DIAGNOSIS — N200 Calculus of kidney: Secondary | ICD-10-CM | POA: Diagnosis not present

## 2015-09-22 ENCOUNTER — Encounter (HOSPITAL_COMMUNITY)
Admission: RE | Admit: 2015-09-22 | Discharge: 2015-09-22 | Disposition: A | Discharge status: Home / Self Care | Payer: Self-pay | Source: Ambulatory Visit | Attending: Internal Medicine | Admitting: Internal Medicine

## 2015-09-24 ENCOUNTER — Encounter (HOSPITAL_COMMUNITY): Payer: Self-pay

## 2015-09-27 ENCOUNTER — Encounter (HOSPITAL_COMMUNITY): Payer: Self-pay

## 2015-09-27 ENCOUNTER — Ambulatory Visit (INDEPENDENT_AMBULATORY_CARE_PROVIDER_SITE_OTHER): Payer: Medicare Other | Admitting: *Deleted

## 2015-09-27 DIAGNOSIS — I442 Atrioventricular block, complete: Secondary | ICD-10-CM

## 2015-09-27 NOTE — Progress Notes (Signed)
Remote pacemaker transmission.   

## 2015-09-29 ENCOUNTER — Encounter (HOSPITAL_COMMUNITY)
Admission: RE | Admit: 2015-09-29 | Discharge: 2015-09-29 | Disposition: A | Discharge status: Home / Self Care | Payer: Self-pay | Source: Ambulatory Visit | Attending: Internal Medicine | Admitting: Internal Medicine

## 2015-09-29 DIAGNOSIS — Z48812 Encounter for surgical aftercare following surgery on the circulatory system: Secondary | ICD-10-CM | POA: Insufficient documentation

## 2015-09-29 DIAGNOSIS — Z952 Presence of prosthetic heart valve: Secondary | ICD-10-CM | POA: Insufficient documentation

## 2015-09-29 DIAGNOSIS — Z950 Presence of cardiac pacemaker: Secondary | ICD-10-CM | POA: Insufficient documentation

## 2015-10-01 ENCOUNTER — Encounter (HOSPITAL_COMMUNITY)
Admission: RE | Admit: 2015-10-01 | Discharge: 2015-10-01 | Disposition: A | Discharge status: Home / Self Care | Payer: Self-pay | Source: Ambulatory Visit | Attending: Internal Medicine | Admitting: Internal Medicine

## 2015-10-01 LAB — CUP PACEART REMOTE DEVICE CHECK
Battery Voltage: 2.8 V
Brady Statistic AP VS Percent: 0 %
Brady Statistic AS VP Percent: 86 %
Date Time Interrogation Session: 20170130144652
Implantable Lead Implant Date: 20140625
Implantable Lead Location: 753859
Implantable Lead Model: 5076
Lead Channel Pacing Threshold Amplitude: 0.5 V
Lead Channel Pacing Threshold Pulse Width: 0.4 ms
Lead Channel Pacing Threshold Pulse Width: 0.4 ms
Lead Channel Setting Pacing Pulse Width: 0.4 ms
Lead Channel Setting Sensing Sensitivity: 2 mV
MDC IDC LEAD IMPLANT DT: 20140625
MDC IDC LEAD LOCATION: 753860
MDC IDC MSMT BATTERY IMPEDANCE: 157 Ohm
MDC IDC MSMT BATTERY REMAINING LONGEVITY: 137 mo
MDC IDC MSMT LEADCHNL RA IMPEDANCE VALUE: 474 Ohm
MDC IDC MSMT LEADCHNL RA PACING THRESHOLD AMPLITUDE: 0.75 V
MDC IDC MSMT LEADCHNL RA SENSING INTR AMPL: 2.8 mV
MDC IDC MSMT LEADCHNL RV IMPEDANCE VALUE: 490 Ohm
MDC IDC SET LEADCHNL RA PACING AMPLITUDE: 1.5 V
MDC IDC SET LEADCHNL RV PACING AMPLITUDE: 2 V
MDC IDC STAT BRADY AP VP PERCENT: 14 %
MDC IDC STAT BRADY AS VS PERCENT: 0 %

## 2015-10-04 ENCOUNTER — Encounter (HOSPITAL_COMMUNITY)
Admission: RE | Admit: 2015-10-04 | Discharge: 2015-10-04 | Disposition: A | Discharge status: Home / Self Care | Payer: Self-pay | Source: Ambulatory Visit | Attending: Internal Medicine | Admitting: Internal Medicine

## 2015-10-06 ENCOUNTER — Encounter (HOSPITAL_COMMUNITY)
Admission: RE | Admit: 2015-10-06 | Discharge: 2015-10-06 | Disposition: A | Discharge status: Home / Self Care | Payer: Self-pay | Source: Ambulatory Visit | Attending: Internal Medicine | Admitting: Internal Medicine

## 2015-10-08 ENCOUNTER — Encounter (HOSPITAL_COMMUNITY)
Admission: RE | Admit: 2015-10-08 | Discharge: 2015-10-08 | Disposition: A | Discharge status: Home / Self Care | Payer: Self-pay | Source: Ambulatory Visit | Attending: Internal Medicine | Admitting: Internal Medicine

## 2015-10-08 ENCOUNTER — Encounter: Payer: Self-pay | Admitting: Cardiology

## 2015-10-11 ENCOUNTER — Encounter (HOSPITAL_COMMUNITY): Payer: Self-pay

## 2015-10-13 ENCOUNTER — Encounter (HOSPITAL_COMMUNITY)
Admission: RE | Admit: 2015-10-13 | Discharge: 2015-10-13 | Disposition: A | Discharge status: Home / Self Care | Payer: Self-pay | Source: Ambulatory Visit | Attending: Internal Medicine | Admitting: Internal Medicine

## 2015-10-15 ENCOUNTER — Encounter (HOSPITAL_COMMUNITY)
Admission: RE | Admit: 2015-10-15 | Discharge: 2015-10-15 | Disposition: A | Discharge status: Home / Self Care | Payer: Self-pay | Source: Ambulatory Visit | Attending: Internal Medicine | Admitting: Internal Medicine

## 2015-10-18 ENCOUNTER — Encounter (HOSPITAL_COMMUNITY)
Admission: RE | Admit: 2015-10-18 | Discharge: 2015-10-18 | Disposition: A | Discharge status: Home / Self Care | Payer: Self-pay | Source: Ambulatory Visit | Attending: Internal Medicine | Admitting: Internal Medicine

## 2015-10-20 ENCOUNTER — Encounter (HOSPITAL_COMMUNITY)
Admission: RE | Admit: 2015-10-20 | Discharge: 2015-10-20 | Disposition: A | Discharge status: Home / Self Care | Payer: Self-pay | Source: Ambulatory Visit | Attending: Internal Medicine | Admitting: Internal Medicine

## 2015-10-22 ENCOUNTER — Encounter (HOSPITAL_COMMUNITY)
Admission: RE | Admit: 2015-10-22 | Discharge: 2015-10-22 | Disposition: A | Discharge status: Home / Self Care | Payer: Self-pay | Source: Ambulatory Visit | Attending: Internal Medicine | Admitting: Internal Medicine

## 2015-10-25 ENCOUNTER — Encounter (HOSPITAL_COMMUNITY): Payer: Self-pay

## 2015-10-27 ENCOUNTER — Encounter (HOSPITAL_COMMUNITY)
Admission: RE | Admit: 2015-10-27 | Discharge: 2015-10-27 | Disposition: A | Discharge status: Home / Self Care | Payer: Self-pay | Source: Ambulatory Visit | Attending: Internal Medicine | Admitting: Internal Medicine

## 2015-10-27 DIAGNOSIS — Z952 Presence of prosthetic heart valve: Secondary | ICD-10-CM | POA: Insufficient documentation

## 2015-10-27 DIAGNOSIS — Z48812 Encounter for surgical aftercare following surgery on the circulatory system: Secondary | ICD-10-CM | POA: Insufficient documentation

## 2015-10-27 DIAGNOSIS — Z950 Presence of cardiac pacemaker: Secondary | ICD-10-CM | POA: Insufficient documentation

## 2015-10-29 ENCOUNTER — Encounter (HOSPITAL_COMMUNITY)
Admission: RE | Admit: 2015-10-29 | Discharge: 2015-10-29 | Disposition: A | Discharge status: Home / Self Care | Payer: Self-pay | Source: Ambulatory Visit | Attending: Internal Medicine | Admitting: Internal Medicine

## 2015-11-01 ENCOUNTER — Encounter (HOSPITAL_COMMUNITY): Payer: Self-pay

## 2015-11-03 ENCOUNTER — Encounter (HOSPITAL_COMMUNITY)
Admission: RE | Admit: 2015-11-03 | Discharge: 2015-11-03 | Disposition: A | Discharge status: Home / Self Care | Payer: Self-pay | Source: Ambulatory Visit | Attending: Internal Medicine | Admitting: Internal Medicine

## 2015-11-04 DIAGNOSIS — C44629 Squamous cell carcinoma of skin of left upper limb, including shoulder: Secondary | ICD-10-CM | POA: Diagnosis not present

## 2015-11-04 DIAGNOSIS — L570 Actinic keratosis: Secondary | ICD-10-CM | POA: Diagnosis not present

## 2015-11-04 DIAGNOSIS — D485 Neoplasm of uncertain behavior of skin: Secondary | ICD-10-CM | POA: Diagnosis not present

## 2015-11-05 ENCOUNTER — Encounter (HOSPITAL_COMMUNITY)
Admission: RE | Admit: 2015-11-05 | Discharge: 2015-11-05 | Disposition: A | Discharge status: Home / Self Care | Payer: Self-pay | Source: Ambulatory Visit | Attending: Internal Medicine | Admitting: Internal Medicine

## 2015-11-08 ENCOUNTER — Other Ambulatory Visit (HOSPITAL_COMMUNITY): Payer: Self-pay | Admitting: Internal Medicine

## 2015-11-08 ENCOUNTER — Encounter (HOSPITAL_COMMUNITY)
Admission: RE | Admit: 2015-11-08 | Discharge: 2015-11-08 | Disposition: A | Discharge status: Home / Self Care | Payer: Self-pay | Source: Ambulatory Visit | Attending: Internal Medicine | Admitting: Internal Medicine

## 2015-11-10 ENCOUNTER — Encounter (HOSPITAL_COMMUNITY)
Admission: RE | Admit: 2015-11-10 | Discharge: 2015-11-10 | Disposition: A | Discharge status: Home / Self Care | Payer: Self-pay | Source: Ambulatory Visit | Attending: Internal Medicine | Admitting: Internal Medicine

## 2015-11-12 ENCOUNTER — Encounter (HOSPITAL_COMMUNITY)
Admission: RE | Admit: 2015-11-12 | Discharge: 2015-11-12 | Disposition: A | Discharge status: Home / Self Care | Payer: Self-pay | Source: Ambulatory Visit | Attending: Internal Medicine | Admitting: Internal Medicine

## 2015-11-15 ENCOUNTER — Encounter (HOSPITAL_COMMUNITY): Payer: Self-pay

## 2015-11-17 ENCOUNTER — Encounter (HOSPITAL_COMMUNITY)
Admission: RE | Admit: 2015-11-17 | Discharge: 2015-11-17 | Disposition: A | Discharge status: Home / Self Care | Payer: Self-pay | Source: Ambulatory Visit | Attending: Internal Medicine | Admitting: Internal Medicine

## 2015-11-19 ENCOUNTER — Encounter (HOSPITAL_COMMUNITY)
Admission: RE | Admit: 2015-11-19 | Discharge: 2015-11-19 | Disposition: A | Discharge status: Home / Self Care | Payer: Self-pay | Source: Ambulatory Visit | Attending: Internal Medicine | Admitting: Internal Medicine

## 2015-11-22 ENCOUNTER — Encounter (HOSPITAL_COMMUNITY)
Admission: RE | Admit: 2015-11-22 | Discharge: 2015-11-22 | Disposition: A | Discharge status: Home / Self Care | Payer: Self-pay | Source: Ambulatory Visit | Attending: Internal Medicine | Admitting: Internal Medicine

## 2015-11-24 ENCOUNTER — Encounter (HOSPITAL_COMMUNITY)
Admission: RE | Admit: 2015-11-24 | Discharge: 2015-11-24 | Disposition: A | Discharge status: Home / Self Care | Payer: Self-pay | Source: Ambulatory Visit | Attending: Internal Medicine | Admitting: Internal Medicine

## 2015-11-26 ENCOUNTER — Encounter (HOSPITAL_COMMUNITY): Payer: Self-pay

## 2015-11-29 ENCOUNTER — Encounter (HOSPITAL_COMMUNITY)
Admission: RE | Admit: 2015-11-29 | Discharge: 2015-11-29 | Disposition: A | Discharge status: Home / Self Care | Payer: Self-pay | Source: Ambulatory Visit | Attending: Internal Medicine | Admitting: Internal Medicine

## 2015-11-29 DIAGNOSIS — Z952 Presence of prosthetic heart valve: Secondary | ICD-10-CM | POA: Insufficient documentation

## 2015-11-29 DIAGNOSIS — Z48812 Encounter for surgical aftercare following surgery on the circulatory system: Secondary | ICD-10-CM | POA: Insufficient documentation

## 2015-11-29 DIAGNOSIS — Z950 Presence of cardiac pacemaker: Secondary | ICD-10-CM | POA: Insufficient documentation

## 2015-12-01 ENCOUNTER — Encounter (HOSPITAL_COMMUNITY)
Admission: RE | Admit: 2015-12-01 | Discharge: 2015-12-01 | Disposition: A | Discharge status: Home / Self Care | Payer: Self-pay | Source: Ambulatory Visit | Attending: Internal Medicine | Admitting: Internal Medicine

## 2015-12-03 ENCOUNTER — Encounter (HOSPITAL_COMMUNITY)
Admission: RE | Admit: 2015-12-03 | Discharge: 2015-12-03 | Disposition: A | Discharge status: Home / Self Care | Payer: Self-pay | Source: Ambulatory Visit | Attending: Internal Medicine | Admitting: Internal Medicine

## 2015-12-06 ENCOUNTER — Encounter (HOSPITAL_COMMUNITY): Payer: Self-pay

## 2015-12-07 DIAGNOSIS — H10502 Unspecified blepharoconjunctivitis, left eye: Secondary | ICD-10-CM | POA: Diagnosis not present

## 2015-12-08 ENCOUNTER — Encounter (HOSPITAL_COMMUNITY)
Admission: RE | Admit: 2015-12-08 | Discharge: 2015-12-08 | Disposition: A | Discharge status: Home / Self Care | Payer: Self-pay | Source: Ambulatory Visit | Attending: Internal Medicine | Admitting: Internal Medicine

## 2015-12-10 ENCOUNTER — Encounter (HOSPITAL_COMMUNITY)
Admission: RE | Admit: 2015-12-10 | Discharge: 2015-12-10 | Disposition: A | Discharge status: Home / Self Care | Payer: Self-pay | Source: Ambulatory Visit | Attending: Internal Medicine | Admitting: Internal Medicine

## 2015-12-10 ENCOUNTER — Other Ambulatory Visit (HOSPITAL_COMMUNITY): Payer: Self-pay | Admitting: *Deleted

## 2015-12-13 ENCOUNTER — Encounter (HOSPITAL_COMMUNITY)
Admission: RE | Admit: 2015-12-13 | Discharge: 2015-12-13 | Disposition: A | Discharge status: Home / Self Care | Payer: Self-pay | Source: Ambulatory Visit | Attending: Internal Medicine | Admitting: Internal Medicine

## 2015-12-15 ENCOUNTER — Encounter (HOSPITAL_COMMUNITY)
Admission: RE | Admit: 2015-12-15 | Discharge: 2015-12-15 | Disposition: A | Discharge status: Home / Self Care | Payer: Self-pay | Source: Ambulatory Visit | Attending: Internal Medicine | Admitting: Internal Medicine

## 2015-12-17 ENCOUNTER — Encounter (HOSPITAL_COMMUNITY)
Admission: RE | Admit: 2015-12-17 | Discharge: 2015-12-17 | Disposition: A | Discharge status: Home / Self Care | Payer: Self-pay | Source: Ambulatory Visit | Attending: Internal Medicine | Admitting: Internal Medicine

## 2015-12-17 ENCOUNTER — Encounter (HOSPITAL_COMMUNITY): Payer: Self-pay | Admitting: *Deleted

## 2015-12-17 DIAGNOSIS — Z952 Presence of prosthetic heart valve: Secondary | ICD-10-CM

## 2015-12-17 NOTE — Progress Notes (Signed)
Received note from Cardiac Rehab, pt has completed Phase II and would like to continue into Maintenance Program, ok per Dr Haroldine Laws, ref placed in epic

## 2015-12-20 ENCOUNTER — Encounter (HOSPITAL_COMMUNITY)
Admission: RE | Admit: 2015-12-20 | Discharge: 2015-12-20 | Disposition: A | Discharge status: Home / Self Care | Payer: Self-pay | Source: Ambulatory Visit | Attending: Internal Medicine | Admitting: Internal Medicine

## 2015-12-22 ENCOUNTER — Encounter (HOSPITAL_COMMUNITY): Payer: Self-pay

## 2015-12-24 ENCOUNTER — Encounter (HOSPITAL_COMMUNITY): Payer: Self-pay

## 2015-12-27 ENCOUNTER — Encounter (HOSPITAL_COMMUNITY)
Admission: RE | Admit: 2015-12-27 | Discharge: 2015-12-27 | Disposition: A | Discharge status: Home / Self Care | Payer: Self-pay | Source: Ambulatory Visit | Attending: Internal Medicine | Admitting: Internal Medicine

## 2015-12-27 ENCOUNTER — Ambulatory Visit (INDEPENDENT_AMBULATORY_CARE_PROVIDER_SITE_OTHER): Payer: Medicare Other | Admitting: *Deleted

## 2015-12-27 ENCOUNTER — Telehealth: Payer: Self-pay | Admitting: Cardiology

## 2015-12-27 DIAGNOSIS — I442 Atrioventricular block, complete: Secondary | ICD-10-CM | POA: Diagnosis not present

## 2015-12-27 DIAGNOSIS — Z950 Presence of cardiac pacemaker: Secondary | ICD-10-CM | POA: Insufficient documentation

## 2015-12-27 NOTE — Telephone Encounter (Signed)
Spoke with pt and reminded pt of remote transmission that is due today. Pt verbalized understanding.   

## 2015-12-27 NOTE — Progress Notes (Signed)
Remote pacemaker transmission.   

## 2015-12-29 ENCOUNTER — Encounter (HOSPITAL_COMMUNITY)
Admission: RE | Admit: 2015-12-29 | Discharge: 2015-12-29 | Disposition: A | Discharge status: Home / Self Care | Payer: Self-pay | Source: Ambulatory Visit | Attending: Internal Medicine | Admitting: Internal Medicine

## 2015-12-31 ENCOUNTER — Encounter (HOSPITAL_COMMUNITY)
Admission: RE | Admit: 2015-12-31 | Discharge: 2015-12-31 | Disposition: A | Discharge status: Home / Self Care | Payer: Self-pay | Source: Ambulatory Visit | Attending: Internal Medicine | Admitting: Internal Medicine

## 2016-01-03 ENCOUNTER — Encounter (HOSPITAL_COMMUNITY)
Admission: RE | Admit: 2016-01-03 | Discharge: 2016-01-03 | Disposition: A | Discharge status: Home / Self Care | Payer: Self-pay | Source: Ambulatory Visit | Attending: Internal Medicine | Admitting: Internal Medicine

## 2016-01-05 ENCOUNTER — Encounter (HOSPITAL_COMMUNITY)
Admission: RE | Admit: 2016-01-05 | Discharge: 2016-01-05 | Disposition: A | Discharge status: Home / Self Care | Payer: Self-pay | Source: Ambulatory Visit | Attending: Internal Medicine | Admitting: Internal Medicine

## 2016-01-07 ENCOUNTER — Encounter (HOSPITAL_COMMUNITY): Payer: Self-pay

## 2016-01-10 ENCOUNTER — Encounter (HOSPITAL_COMMUNITY): Payer: Self-pay

## 2016-01-12 ENCOUNTER — Encounter (HOSPITAL_COMMUNITY)
Admission: RE | Admit: 2016-01-12 | Discharge: 2016-01-12 | Disposition: A | Discharge status: Home / Self Care | Payer: Self-pay | Source: Ambulatory Visit | Attending: Internal Medicine | Admitting: Internal Medicine

## 2016-01-14 ENCOUNTER — Encounter (HOSPITAL_COMMUNITY)
Admission: RE | Admit: 2016-01-14 | Discharge: 2016-01-14 | Disposition: A | Discharge status: Home / Self Care | Payer: Self-pay | Source: Ambulatory Visit | Attending: Internal Medicine | Admitting: Internal Medicine

## 2016-01-17 ENCOUNTER — Encounter (HOSPITAL_COMMUNITY)
Admission: RE | Admit: 2016-01-17 | Discharge: 2016-01-17 | Disposition: A | Discharge status: Home / Self Care | Payer: Self-pay | Source: Ambulatory Visit | Attending: Internal Medicine | Admitting: Internal Medicine

## 2016-01-19 ENCOUNTER — Encounter (HOSPITAL_COMMUNITY)
Admission: RE | Admit: 2016-01-19 | Discharge: 2016-01-19 | Disposition: A | Discharge status: Home / Self Care | Payer: Self-pay | Source: Ambulatory Visit | Attending: Internal Medicine | Admitting: Internal Medicine

## 2016-01-20 DIAGNOSIS — L821 Other seborrheic keratosis: Secondary | ICD-10-CM | POA: Diagnosis not present

## 2016-01-20 DIAGNOSIS — D225 Melanocytic nevi of trunk: Secondary | ICD-10-CM | POA: Diagnosis not present

## 2016-01-20 DIAGNOSIS — D1801 Hemangioma of skin and subcutaneous tissue: Secondary | ICD-10-CM | POA: Diagnosis not present

## 2016-01-20 DIAGNOSIS — D2261 Melanocytic nevi of right upper limb, including shoulder: Secondary | ICD-10-CM | POA: Diagnosis not present

## 2016-01-20 DIAGNOSIS — D2262 Melanocytic nevi of left upper limb, including shoulder: Secondary | ICD-10-CM | POA: Diagnosis not present

## 2016-01-20 DIAGNOSIS — D485 Neoplasm of uncertain behavior of skin: Secondary | ICD-10-CM | POA: Diagnosis not present

## 2016-01-20 DIAGNOSIS — D044 Carcinoma in situ of skin of scalp and neck: Secondary | ICD-10-CM | POA: Diagnosis not present

## 2016-01-20 DIAGNOSIS — Z85828 Personal history of other malignant neoplasm of skin: Secondary | ICD-10-CM | POA: Diagnosis not present

## 2016-01-21 ENCOUNTER — Encounter (HOSPITAL_COMMUNITY): Payer: Self-pay

## 2016-01-26 ENCOUNTER — Encounter (HOSPITAL_COMMUNITY)
Admission: RE | Admit: 2016-01-26 | Discharge: 2016-01-26 | Disposition: A | Discharge status: Home / Self Care | Payer: Self-pay | Source: Ambulatory Visit | Attending: Internal Medicine | Admitting: Internal Medicine

## 2016-01-28 ENCOUNTER — Encounter (HOSPITAL_COMMUNITY)
Admission: RE | Admit: 2016-01-28 | Discharge: 2016-01-28 | Disposition: A | Discharge status: Home / Self Care | Payer: Self-pay | Source: Ambulatory Visit | Attending: Internal Medicine | Admitting: Internal Medicine

## 2016-01-28 DIAGNOSIS — Z950 Presence of cardiac pacemaker: Secondary | ICD-10-CM | POA: Insufficient documentation

## 2016-01-31 ENCOUNTER — Encounter (HOSPITAL_COMMUNITY)
Admission: RE | Admit: 2016-01-31 | Discharge: 2016-01-31 | Disposition: A | Discharge status: Home / Self Care | Payer: Self-pay | Source: Ambulatory Visit | Attending: Internal Medicine | Admitting: Internal Medicine

## 2016-02-02 ENCOUNTER — Encounter (HOSPITAL_COMMUNITY)
Admission: RE | Admit: 2016-02-02 | Discharge: 2016-02-02 | Disposition: A | Discharge status: Home / Self Care | Payer: Self-pay | Source: Ambulatory Visit | Attending: Internal Medicine | Admitting: Internal Medicine

## 2016-02-04 ENCOUNTER — Encounter: Payer: Self-pay | Admitting: Cardiology

## 2016-02-04 ENCOUNTER — Encounter (HOSPITAL_COMMUNITY)
Admission: RE | Admit: 2016-02-04 | Discharge: 2016-02-04 | Disposition: A | Discharge status: Home / Self Care | Payer: Self-pay | Source: Ambulatory Visit | Attending: Internal Medicine | Admitting: Internal Medicine

## 2016-02-04 LAB — CUP PACEART REMOTE DEVICE CHECK
Battery Remaining Longevity: 132 mo
Battery Voltage: 2.79 V
Brady Statistic AP VP Percent: 12 %
Brady Statistic AS VP Percent: 88 %
Date Time Interrogation Session: 20170501132441
Implantable Lead Implant Date: 20140625
Implantable Lead Implant Date: 20140625
Implantable Lead Location: 753859
Implantable Lead Location: 753860
Implantable Lead Model: 5076
Implantable Lead Model: 5076
Lead Channel Impedance Value: 500 Ohm
Lead Channel Pacing Threshold Amplitude: 0.5 V
Lead Channel Pacing Threshold Amplitude: 0.625 V
Lead Channel Pacing Threshold Pulse Width: 0.4 ms
Lead Channel Sensing Intrinsic Amplitude: 2.8 mV
Lead Channel Setting Pacing Amplitude: 2 V
Lead Channel Setting Pacing Pulse Width: 0.4 ms
MDC IDC MSMT BATTERY IMPEDANCE: 180 Ohm
MDC IDC MSMT LEADCHNL RA IMPEDANCE VALUE: 449 Ohm
MDC IDC MSMT LEADCHNL RA PACING THRESHOLD PULSEWIDTH: 0.4 ms
MDC IDC SET LEADCHNL RA PACING AMPLITUDE: 1.5 V
MDC IDC SET LEADCHNL RV SENSING SENSITIVITY: 2 mV
MDC IDC STAT BRADY AP VS PERCENT: 0 %
MDC IDC STAT BRADY AS VS PERCENT: 0 %

## 2016-02-07 ENCOUNTER — Encounter (HOSPITAL_COMMUNITY): Payer: Self-pay

## 2016-02-09 ENCOUNTER — Encounter (HOSPITAL_COMMUNITY): Admission: RE | Admit: 2016-02-09 | Payer: Self-pay | Source: Ambulatory Visit

## 2016-02-11 ENCOUNTER — Encounter (HOSPITAL_COMMUNITY)
Admission: RE | Admit: 2016-02-11 | Discharge: 2016-02-11 | Disposition: A | Discharge status: Home / Self Care | Payer: Self-pay | Source: Ambulatory Visit | Attending: Internal Medicine | Admitting: Internal Medicine

## 2016-02-14 ENCOUNTER — Other Ambulatory Visit (HOSPITAL_COMMUNITY): Payer: Self-pay | Admitting: Internal Medicine

## 2016-02-14 ENCOUNTER — Encounter (HOSPITAL_COMMUNITY)
Admission: RE | Admit: 2016-02-14 | Discharge: 2016-02-14 | Disposition: A | Discharge status: Home / Self Care | Payer: Self-pay | Source: Ambulatory Visit | Attending: Internal Medicine | Admitting: Internal Medicine

## 2016-02-16 ENCOUNTER — Encounter (HOSPITAL_COMMUNITY)
Admission: RE | Admit: 2016-02-16 | Discharge: 2016-02-16 | Disposition: A | Discharge status: Home / Self Care | Payer: Self-pay | Source: Ambulatory Visit | Attending: Internal Medicine | Admitting: Internal Medicine

## 2016-02-18 ENCOUNTER — Encounter (HOSPITAL_COMMUNITY)
Admission: RE | Admit: 2016-02-18 | Discharge: 2016-02-18 | Disposition: A | Discharge status: Home / Self Care | Payer: Self-pay | Source: Ambulatory Visit | Attending: Internal Medicine | Admitting: Internal Medicine

## 2016-02-21 ENCOUNTER — Encounter (HOSPITAL_COMMUNITY): Payer: Self-pay

## 2016-02-23 ENCOUNTER — Encounter (HOSPITAL_COMMUNITY)
Admission: RE | Admit: 2016-02-23 | Discharge: 2016-02-23 | Disposition: A | Discharge status: Home / Self Care | Payer: Self-pay | Source: Ambulatory Visit | Attending: Internal Medicine | Admitting: Internal Medicine

## 2016-02-25 ENCOUNTER — Encounter (HOSPITAL_COMMUNITY)
Admission: RE | Admit: 2016-02-25 | Discharge: 2016-02-25 | Disposition: A | Discharge status: Home / Self Care | Payer: Self-pay | Source: Ambulatory Visit | Attending: Internal Medicine | Admitting: Internal Medicine

## 2016-02-28 ENCOUNTER — Encounter (HOSPITAL_COMMUNITY): Payer: Medicare Other

## 2016-02-28 DIAGNOSIS — Z950 Presence of cardiac pacemaker: Secondary | ICD-10-CM | POA: Insufficient documentation

## 2016-03-01 ENCOUNTER — Encounter (HOSPITAL_COMMUNITY)
Admission: RE | Admit: 2016-03-01 | Discharge: 2016-03-01 | Disposition: A | Discharge status: Home / Self Care | Payer: Self-pay | Source: Ambulatory Visit | Attending: Internal Medicine | Admitting: Internal Medicine

## 2016-03-03 ENCOUNTER — Encounter (HOSPITAL_COMMUNITY)
Admission: RE | Admit: 2016-03-03 | Discharge: 2016-03-03 | Disposition: A | Discharge status: Home / Self Care | Payer: Self-pay | Source: Ambulatory Visit | Attending: Internal Medicine | Admitting: Internal Medicine

## 2016-03-06 ENCOUNTER — Encounter (HOSPITAL_COMMUNITY): Payer: Self-pay

## 2016-03-06 DIAGNOSIS — Z85828 Personal history of other malignant neoplasm of skin: Secondary | ICD-10-CM | POA: Diagnosis not present

## 2016-03-06 DIAGNOSIS — L720 Epidermal cyst: Secondary | ICD-10-CM | POA: Diagnosis not present

## 2016-03-08 ENCOUNTER — Encounter (HOSPITAL_COMMUNITY)
Admission: RE | Admit: 2016-03-08 | Discharge: 2016-03-08 | Disposition: A | Discharge status: Home / Self Care | Payer: Self-pay | Source: Ambulatory Visit | Attending: Internal Medicine | Admitting: Internal Medicine

## 2016-03-10 ENCOUNTER — Encounter (HOSPITAL_COMMUNITY)
Admission: RE | Admit: 2016-03-10 | Discharge: 2016-03-10 | Disposition: A | Discharge status: Home / Self Care | Payer: Self-pay | Source: Ambulatory Visit | Attending: Internal Medicine | Admitting: Internal Medicine

## 2016-03-13 ENCOUNTER — Encounter (HOSPITAL_COMMUNITY)
Admission: RE | Admit: 2016-03-13 | Discharge: 2016-03-13 | Disposition: A | Discharge status: Home / Self Care | Payer: Self-pay | Source: Ambulatory Visit | Attending: Internal Medicine | Admitting: Internal Medicine

## 2016-03-15 ENCOUNTER — Encounter (HOSPITAL_COMMUNITY)
Admission: RE | Admit: 2016-03-15 | Discharge: 2016-03-15 | Disposition: A | Discharge status: Home / Self Care | Payer: Self-pay | Source: Ambulatory Visit | Attending: Internal Medicine | Admitting: Internal Medicine

## 2016-03-17 ENCOUNTER — Other Ambulatory Visit (HOSPITAL_COMMUNITY): Payer: Self-pay | Admitting: Internal Medicine

## 2016-03-17 ENCOUNTER — Encounter (HOSPITAL_COMMUNITY)
Admission: RE | Admit: 2016-03-17 | Discharge: 2016-03-17 | Disposition: A | Discharge status: Home / Self Care | Payer: Self-pay | Source: Ambulatory Visit | Attending: Internal Medicine | Admitting: Internal Medicine

## 2016-03-20 ENCOUNTER — Encounter (HOSPITAL_COMMUNITY): Payer: Self-pay

## 2016-03-22 ENCOUNTER — Encounter (HOSPITAL_COMMUNITY)
Admission: RE | Admit: 2016-03-22 | Discharge: 2016-03-22 | Disposition: A | Discharge status: Home / Self Care | Payer: Self-pay | Source: Ambulatory Visit | Attending: Internal Medicine | Admitting: Internal Medicine

## 2016-03-24 ENCOUNTER — Encounter (HOSPITAL_COMMUNITY)
Admission: RE | Admit: 2016-03-24 | Discharge: 2016-03-24 | Disposition: A | Discharge status: Home / Self Care | Payer: Self-pay | Source: Ambulatory Visit | Attending: Internal Medicine | Admitting: Internal Medicine

## 2016-03-27 ENCOUNTER — Encounter (HOSPITAL_COMMUNITY)
Admission: RE | Admit: 2016-03-27 | Discharge: 2016-03-27 | Disposition: A | Discharge status: Home / Self Care | Payer: Self-pay | Source: Ambulatory Visit | Attending: Internal Medicine | Admitting: Internal Medicine

## 2016-03-29 ENCOUNTER — Encounter (HOSPITAL_COMMUNITY)
Admission: RE | Admit: 2016-03-29 | Discharge: 2016-03-29 | Disposition: A | Discharge status: Home / Self Care | Payer: Self-pay | Source: Ambulatory Visit | Attending: Internal Medicine | Admitting: Internal Medicine

## 2016-03-29 DIAGNOSIS — Z950 Presence of cardiac pacemaker: Secondary | ICD-10-CM | POA: Insufficient documentation

## 2016-03-31 ENCOUNTER — Encounter (HOSPITAL_COMMUNITY)
Admission: RE | Admit: 2016-03-31 | Discharge: 2016-03-31 | Disposition: A | Discharge status: Home / Self Care | Payer: Self-pay | Source: Ambulatory Visit | Attending: Internal Medicine | Admitting: Internal Medicine

## 2016-04-03 ENCOUNTER — Encounter (HOSPITAL_COMMUNITY): Payer: Self-pay

## 2016-04-05 ENCOUNTER — Encounter (HOSPITAL_COMMUNITY)
Admission: RE | Admit: 2016-04-05 | Discharge: 2016-04-05 | Disposition: A | Discharge status: Home / Self Care | Payer: Self-pay | Source: Ambulatory Visit | Attending: Internal Medicine | Admitting: Internal Medicine

## 2016-04-07 ENCOUNTER — Encounter (HOSPITAL_COMMUNITY)
Admission: RE | Admit: 2016-04-07 | Discharge: 2016-04-07 | Disposition: A | Discharge status: Home / Self Care | Payer: Self-pay | Source: Ambulatory Visit | Attending: Internal Medicine | Admitting: Internal Medicine

## 2016-04-10 ENCOUNTER — Encounter (HOSPITAL_COMMUNITY)
Admission: RE | Admit: 2016-04-10 | Discharge: 2016-04-10 | Disposition: A | Discharge status: Home / Self Care | Payer: Self-pay | Source: Ambulatory Visit | Attending: Internal Medicine | Admitting: Internal Medicine

## 2016-04-12 ENCOUNTER — Encounter (HOSPITAL_COMMUNITY)
Admission: RE | Admit: 2016-04-12 | Discharge: 2016-04-12 | Disposition: A | Discharge status: Home / Self Care | Payer: Self-pay | Source: Ambulatory Visit | Attending: Internal Medicine | Admitting: Internal Medicine

## 2016-04-14 ENCOUNTER — Encounter (HOSPITAL_COMMUNITY)
Admission: RE | Admit: 2016-04-14 | Discharge: 2016-04-14 | Disposition: A | Discharge status: Home / Self Care | Payer: Self-pay | Source: Ambulatory Visit | Attending: Internal Medicine | Admitting: Internal Medicine

## 2016-04-17 ENCOUNTER — Encounter (HOSPITAL_COMMUNITY): Payer: Self-pay

## 2016-04-19 ENCOUNTER — Encounter (HOSPITAL_COMMUNITY)
Admission: RE | Admit: 2016-04-19 | Discharge: 2016-04-19 | Disposition: A | Discharge status: Home / Self Care | Payer: Self-pay | Source: Ambulatory Visit | Attending: Internal Medicine | Admitting: Internal Medicine

## 2016-04-21 ENCOUNTER — Encounter (HOSPITAL_COMMUNITY)
Admission: RE | Admit: 2016-04-21 | Discharge: 2016-04-21 | Disposition: A | Discharge status: Home / Self Care | Payer: Self-pay | Source: Ambulatory Visit | Attending: Internal Medicine | Admitting: Internal Medicine

## 2016-04-24 ENCOUNTER — Encounter (HOSPITAL_COMMUNITY): Payer: Self-pay

## 2016-04-25 ENCOUNTER — Encounter: Payer: Self-pay | Admitting: Nurse Practitioner

## 2016-04-26 ENCOUNTER — Encounter (HOSPITAL_COMMUNITY)
Admission: RE | Admit: 2016-04-26 | Discharge: 2016-04-26 | Disposition: A | Discharge status: Home / Self Care | Payer: Self-pay | Source: Ambulatory Visit | Attending: Internal Medicine | Admitting: Internal Medicine

## 2016-04-28 ENCOUNTER — Encounter (HOSPITAL_COMMUNITY)
Admission: RE | Admit: 2016-04-28 | Discharge: 2016-04-28 | Disposition: A | Discharge status: Home / Self Care | Payer: Self-pay | Source: Ambulatory Visit | Attending: Internal Medicine | Admitting: Internal Medicine

## 2016-04-28 DIAGNOSIS — Z950 Presence of cardiac pacemaker: Secondary | ICD-10-CM | POA: Insufficient documentation

## 2016-05-03 ENCOUNTER — Encounter (HOSPITAL_COMMUNITY): Payer: Self-pay

## 2016-05-05 ENCOUNTER — Encounter (HOSPITAL_COMMUNITY): Payer: Self-pay

## 2016-05-08 ENCOUNTER — Encounter (HOSPITAL_COMMUNITY)
Admission: RE | Admit: 2016-05-08 | Discharge: 2016-05-08 | Disposition: A | Discharge status: Home / Self Care | Payer: Self-pay | Source: Ambulatory Visit | Attending: Internal Medicine | Admitting: Internal Medicine

## 2016-05-10 ENCOUNTER — Encounter (HOSPITAL_COMMUNITY)
Admission: RE | Admit: 2016-05-10 | Discharge: 2016-05-10 | Disposition: A | Discharge status: Home / Self Care | Payer: Self-pay | Source: Ambulatory Visit | Attending: Internal Medicine | Admitting: Internal Medicine

## 2016-05-10 NOTE — Progress Notes (Signed)
Electrophysiology Office Note Date: 05/11/2016  ID:  [name], DOB 26-Dec-1940, MRN [id number]  PCP: Lilian Coma, MD  Cardiologist: [place] Electrophysiologist: Caryl Comes  CC: Pacemaker follow-up  [name] is a 75 y.o. male seen today for Dr Caryl Comes.  He presents today for routine electrophysiology followup.  Since last being seen in our clinic, the patient reports doing very well.  He denies chest pain, palpitations, dyspnea, PND, orthopnea, nausea, vomiting, dizziness, syncope, edema, weight gain, or early satiety.  He is still participating in maintenance cardiac rehab and fishing at the [place].   Echo 2014 demonstrated EF 55-60%, no RWMA, aortic valve not well seen but no AR and normal systolic gradient  Device History: MDT dual chamber PPM implanted 2014 for complete heart block    Past Medical History:  Diagnosis Date  . Aortic stenosis    s/p AVR 6/14  . Asthma   . Atrioventricular block, complete ([place])    a. s/p MDT dual chamber PPM 2014  . Chronic lymphocytic leukemia ([place])    followed by Dr. Ouida Sills  . Depression   . DM2 (diabetes mellitus, type 2) ([facility])    fasting blood sugar - 90s  . Exposure to secondhand smoke    extensive exposure  . Hyperlipidemia    treated  . LBBB (left bundle branch block)   . Testicular cancer [facility])    s/p XRT and surgical resection.   Past Surgical History:  Procedure Laterality Date  . AORTIC VALVE REPLACEMENT N/A 02/17/2013   Procedure: AORTIC VALVE REPLACEMENT (AVR);  Surgeon: Gaye Pollack, MD;  Location: [place];  Service: Open Heart Surgery;  Laterality: N/A;  . CYST REMOVAL NECK    . INNER EAR SURGERY    . INTRAOPERATIVE TRANSESOPHAGEAL ECHOCARDIOGRAM N/A 02/17/2013   Procedure: INTRAOPERATIVE TRANSESOPHAGEAL ECHOCARDIOGRAM;  Surgeon: Gaye Pollack, MD;  Location: [facility] OR;  Service: Open Heart Surgery;  Laterality: N/A;  . LEFT AND RIGHT HEART CATHETERIZATION WITH CORONARY ANGIOGRAM N/A 12/25/2012   Procedure: LEFT AND RIGHT HEART CATHETERIZATION WITH CORONARY ANGIOGRAM;  Surgeon: Jolaine Artist, MD;  Location: [facility] CATH LAB;  Service: Cardiovascular;  Laterality: N/A;  . PERMANENT PACEMAKER INSERTION N/A 02/19/2013   Procedure: PERMANENT PACEMAKER INSERTION;  Surgeon: Deboraha Sprang, MD;  Location: [facility] CATH LAB;  Service: Cardiovascular;  Laterality: N/A;  . stapendectomy  07/30/02   right  . XRT and surgical resection     s/p. 10 years ago    Current Outpatient Prescriptions  Medication Sig Dispense Refill  . acetaminophen (TYLENOL) 500 MG tablet Take 500 mg by mouth 3 (three) times daily.     Marland Kitchen aspirin 81 MG tablet Take 1 tablet (81 mg total) by mouth daily. 30 tablet 11  . atorvastatin (LIPITOR) 40 MG tablet Take 40 mg by mouth daily.    . bifidobacterium infantis (ALIGN) capsule Take 1 capsule by mouth daily.    . busPIRone (BUSPAR) 15 MG tablet Take by mouth. One tablet by mouth every morning and two tablets by mouth every day at noon    . cholecalciferol (VITAMIN D) 1000 UNITS tablet Take 1,000 Units by mouth daily.    . Cholestyramine POWD Take 1 scoop by mouth 2 (two) times daily.     . cycloSPORINE (RESTASIS) 0.05 % ophthalmic emulsion Place 1 drop into both eyes 2 (two) times daily.     Marland Kitchen desloratadine (CLARINEX) 5 MG tablet Take 5 mg by mouth daily.     . fenofibrate 160  MG tablet Take 160 mg by mouth daily.     . fluticasone (FLONASE) 50 MCG/ACT nasal spray Place 2 sprays into the nose daily.     Marland Kitchen FORA V12 BLOOD GLUCOSE TEST test strip     . furosemide (LASIX) 40 MG tablet Take one (1) tablet (40 mg total) by mouth each morning. Take half (1/2) tablet (20 mg total) by mouth each evening. Take an additional half (1/2) tablet (20 mg total) by mouth in the evening as needed for swelling.    . gabapentin (NEURONTIN) 300 MG capsule Take one (1) capsule (300 mg total) by mouth each morning. Take two (2) capsules (600 mg total) by mouth each afternoon.    Marland Kitchen LITETOUCH LANCETS MISC      . Multiple Vitamin (MULTIVITAMIN) tablet Take 1 tablet by mouth daily.     Vladimir Faster Glycol-Propyl Glycol (SYSTANE OP) Place 1 drop into both eyes 2 (two) times daily.    . potassium chloride SA (K-DUR,KLOR-CON) 20 MEQ tablet Take 20 mEq by mouth daily.    Marland Kitchen PROCTOZONE-HC 2.5 % rectal cream Place 1 application rectally 2 (two) times daily as needed.     . sertraline (ZOLOFT) 100 MG tablet Take 100 mg by mouth daily.     . traZODone (DESYREL) 50 MG tablet Take 150 mg by mouth at bedtime.      No current facility-administered medications for this visit.     Allergies:   Banana and Penicillins   Social History: Social History   Social History  . Marital status: Married    Spouse name: N/A  . Number of children: N/A  . Years of education: N/A   Occupational History  . Not on file.   Social History Main Topics  . Smoking status: Never Smoker  . Smokeless tobacco: Never Used  . Alcohol use No  . Drug use: No  . Sexual activity: Not on file   Other Topics Concern  . Not on file   Social History Narrative   Married and has 4 children. Has significant exposure to 2nd hand smoke. Works as a Engineer, drilling.     Family History: Family History  Problem Relation Age of Onset  . Brain cancer Mother   . Cancer Father     lung     Review of Systems: All other systems reviewed and are otherwise negative except as noted above.   Physical Exam: VS:  BP 110/60   Pulse 60   Ht 5\' 4"  (1.626 m)   Wt 128 lb (58.1 kg)   BMI 21.97 kg/m  , BMI Body mass index is 21.97 kg/m.  GEN- The patient is elderly appearing, alert and oriented x 3 today.   HEENT: normocephalic, atraumatic; sclera clear, conjunctiva pink; hearing intact; oropharynx clear; neck supple  Lungs- Clear to ausculation bilaterally, normal work of breathing.  No wheezes, rales, rhonchi Heart- Regular rate and rhythm (paced)  GI- soft, non-tender, non-distended, bowel sounds present  Extremities- no  clubbing, cyanosis, +LE dependent edema MS- no significant deformity or atrophy Skin- warm and dry, no rash or lesion; PPM pocket well healed Psych- euthymic mood, full affect Neuro- strength and sensation are intact  PPM Interrogation- reviewed in detail today,  See PACEART report  EKG:  EKG is ordered today. The ekg ordered today shows AV pacing   Recent Labs: No results found for requested labs within last 8760 hours.   Wt Readings from Last 3 Encounters:  05/11/16 128 lb (  58.1 kg)  03/26/15 126 lb 3.2 oz (57.2 kg)  11/12/14 124 lb (56.2 kg)     Other studies Reviewed: Additional studies/ records that were reviewed today include: Dr Olin Pia office notes  Assessment and Plan:  1.  Complete heart block  Normal PPM function See Pace Art report No changes today  2.  S/p AVR Dr Haroldine Laws had recommended repeat echo 01/2014, will order now He has not had cardiology follow up in several years, follow up with Dr Haroldine Laws 3 months  3.  LE edema Continue lasix Encouraged low Na+ diet and use of compression hose    Current medicines are reviewed at length with the patient today.   The patient does not have concerns regarding his medicines.  The following changes were made today:  none  Labs/ tests ordered today include: echo  Orders Placed This Encounter  Procedures  . Implantable device check  . EKG 12-Lead  . ECHOCARDIOGRAM COMPLETE     Disposition:   Follow up with [facility] transmissions, Dr Caryl Comes 1 year, Dr Haroldine Laws 3 months      Signed, Chanetta Marshall, NP 05/11/2016 11:11 AM  [facility] [address] [place] [address] [phone] (office) [phone] (fax

## 2016-05-11 ENCOUNTER — Ambulatory Visit (INDEPENDENT_AMBULATORY_CARE_PROVIDER_SITE_OTHER): Payer: Medicare Other | Admitting: Nurse Practitioner

## 2016-05-11 ENCOUNTER — Encounter: Payer: Self-pay | Admitting: Nurse Practitioner

## 2016-05-11 VITALS — BP 110/60 | HR 60 | Ht 64.0 in | Wt 128.0 lb

## 2016-05-11 DIAGNOSIS — R600 Localized edema: Secondary | ICD-10-CM | POA: Diagnosis not present

## 2016-05-11 DIAGNOSIS — D2262 Melanocytic nevi of left upper limb, including shoulder: Secondary | ICD-10-CM | POA: Diagnosis not present

## 2016-05-11 DIAGNOSIS — I359 Nonrheumatic aortic valve disorder, unspecified: Secondary | ICD-10-CM | POA: Diagnosis not present

## 2016-05-11 DIAGNOSIS — Z85828 Personal history of other malignant neoplasm of skin: Secondary | ICD-10-CM | POA: Diagnosis not present

## 2016-05-11 DIAGNOSIS — L905 Scar conditions and fibrosis of skin: Secondary | ICD-10-CM | POA: Diagnosis not present

## 2016-05-11 DIAGNOSIS — L821 Other seborrheic keratosis: Secondary | ICD-10-CM | POA: Diagnosis not present

## 2016-05-11 DIAGNOSIS — D225 Melanocytic nevi of trunk: Secondary | ICD-10-CM | POA: Diagnosis not present

## 2016-05-11 DIAGNOSIS — D2261 Melanocytic nevi of right upper limb, including shoulder: Secondary | ICD-10-CM | POA: Diagnosis not present

## 2016-05-11 DIAGNOSIS — I442 Atrioventricular block, complete: Secondary | ICD-10-CM

## 2016-05-11 DIAGNOSIS — D1801 Hemangioma of skin and subcutaneous tissue: Secondary | ICD-10-CM | POA: Diagnosis not present

## 2016-05-11 LAB — CUP PACEART INCLINIC DEVICE CHECK
Implantable Lead Implant Date: 20140625
Implantable Lead Implant Date: 20140625
Implantable Lead Location: 753859
Implantable Lead Model: 5076
Implantable Lead Model: 5076
MDC IDC LEAD LOCATION: 753860
MDC IDC SESS DTM: 20170914102259

## 2016-05-11 NOTE — Patient Instructions (Signed)
Medication Instructions:    Your physician recommends that you continue on your current medications as directed. Please refer to the Current Medication list given to you today.  If you need a refill on your cardiac medications before your next appointment, please call your pharmacy.  Labwork: NONE ORDER TODAY    Testing/Procedures: Your physician has requested that you have an echocardiogram. Echocardiography is a painless test that uses sound waves to create images of your heart. It provides your doctor with information about the size and shape of your heart and how well your heart's chambers and valves are working. This procedure takes approximately one hour. There are no restrictions for this procedure.    Follow-Up:  DR Mahalia Longest IN 3 MONTHS   Your physician wants you to follow-up in: [place] will receive a reminder letter in the mail two months in advance. If you don't receive a letter, please call our office to schedule the follow-up appointment.     Any Other Special Instructions Will Be Listed Below (If Applicable).

## 2016-05-12 ENCOUNTER — Encounter (HOSPITAL_COMMUNITY)
Admission: RE | Admit: 2016-05-12 | Discharge: 2016-05-12 | Disposition: A | Discharge status: Home / Self Care | Payer: Self-pay | Source: Ambulatory Visit | Attending: Internal Medicine | Admitting: Internal Medicine

## 2016-05-15 ENCOUNTER — Encounter (HOSPITAL_COMMUNITY): Payer: Self-pay

## 2016-05-17 ENCOUNTER — Encounter (HOSPITAL_COMMUNITY): Payer: Self-pay

## 2016-05-19 ENCOUNTER — Encounter (HOSPITAL_COMMUNITY)
Admission: RE | Admit: 2016-05-19 | Discharge: 2016-05-19 | Disposition: A | Discharge status: Home / Self Care | Payer: Self-pay | Source: Ambulatory Visit | Attending: Internal Medicine | Admitting: Internal Medicine

## 2016-05-22 ENCOUNTER — Encounter (HOSPITAL_COMMUNITY)
Admission: RE | Admit: 2016-05-22 | Discharge: 2016-05-22 | Disposition: A | Discharge status: Home / Self Care | Payer: Self-pay | Source: Ambulatory Visit | Attending: Internal Medicine | Admitting: Internal Medicine

## 2016-05-24 ENCOUNTER — Encounter (HOSPITAL_COMMUNITY)
Admission: RE | Admit: 2016-05-24 | Discharge: 2016-05-24 | Disposition: A | Discharge status: Home / Self Care | Payer: Self-pay | Source: Ambulatory Visit | Attending: Internal Medicine | Admitting: Internal Medicine

## 2016-05-25 ENCOUNTER — Other Ambulatory Visit: Payer: Self-pay

## 2016-05-25 ENCOUNTER — Telehealth: Payer: Self-pay | Admitting: *Deleted

## 2016-05-25 ENCOUNTER — Ambulatory Visit (HOSPITAL_COMMUNITY): Payer: Medicare Other | Attending: Cardiology

## 2016-05-25 DIAGNOSIS — Z952 Presence of prosthetic heart valve: Secondary | ICD-10-CM | POA: Diagnosis not present

## 2016-05-25 DIAGNOSIS — I359 Nonrheumatic aortic valve disorder, unspecified: Secondary | ICD-10-CM

## 2016-05-25 DIAGNOSIS — Z950 Presence of cardiac pacemaker: Secondary | ICD-10-CM | POA: Insufficient documentation

## 2016-05-25 NOTE — Telephone Encounter (Signed)
SPOKE TO PT ABOUT RESULTS AND VERBALIZED UNDERSTANDING  

## 2016-05-25 NOTE — Telephone Encounter (Signed)
-----   Message from Patsey Berthold, NP sent at 05/25/2016 12:06 PM EDT ----- Please notify patient of stable echocardiogram.  Heart pumping function normal. Valve looks good. Thanks!

## 2016-05-26 ENCOUNTER — Encounter (HOSPITAL_COMMUNITY)
Admission: RE | Admit: 2016-05-26 | Discharge: 2016-05-26 | Disposition: A | Discharge status: Home / Self Care | Payer: Self-pay | Source: Ambulatory Visit | Attending: Internal Medicine | Admitting: Internal Medicine

## 2016-05-29 ENCOUNTER — Encounter (HOSPITAL_COMMUNITY)
Admission: RE | Admit: 2016-05-29 | Discharge: 2016-05-29 | Disposition: A | Discharge status: Home / Self Care | Payer: Self-pay | Source: Ambulatory Visit | Attending: Internal Medicine | Admitting: Internal Medicine

## 2016-05-29 DIAGNOSIS — Z950 Presence of cardiac pacemaker: Secondary | ICD-10-CM | POA: Insufficient documentation

## 2016-05-31 ENCOUNTER — Encounter (HOSPITAL_COMMUNITY): Payer: Self-pay

## 2016-06-02 ENCOUNTER — Encounter (HOSPITAL_COMMUNITY): Payer: Self-pay

## 2016-06-05 ENCOUNTER — Encounter (HOSPITAL_COMMUNITY)
Admission: RE | Admit: 2016-06-05 | Discharge: 2016-06-05 | Disposition: A | Discharge status: Home / Self Care | Payer: Self-pay | Source: Ambulatory Visit | Attending: Internal Medicine | Admitting: Internal Medicine

## 2016-06-07 ENCOUNTER — Encounter (HOSPITAL_COMMUNITY)
Admission: RE | Admit: 2016-06-07 | Discharge: 2016-06-07 | Disposition: A | Discharge status: Home / Self Care | Payer: Self-pay | Source: Ambulatory Visit | Attending: Internal Medicine | Admitting: Internal Medicine

## 2016-06-09 ENCOUNTER — Encounter (HOSPITAL_COMMUNITY): Payer: Self-pay

## 2016-06-12 ENCOUNTER — Encounter (HOSPITAL_COMMUNITY)
Admission: RE | Admit: 2016-06-12 | Discharge: 2016-06-12 | Disposition: A | Discharge status: Home / Self Care | Payer: Self-pay | Source: Ambulatory Visit | Attending: Internal Medicine | Admitting: Internal Medicine

## 2016-06-14 ENCOUNTER — Encounter (HOSPITAL_COMMUNITY): Payer: Self-pay

## 2016-06-16 ENCOUNTER — Encounter (HOSPITAL_COMMUNITY): Payer: Self-pay

## 2016-06-19 ENCOUNTER — Encounter (HOSPITAL_COMMUNITY)
Admission: RE | Admit: 2016-06-19 | Discharge: 2016-06-19 | Disposition: A | Discharge status: Home / Self Care | Payer: Self-pay | Source: Ambulatory Visit | Attending: Internal Medicine | Admitting: Internal Medicine

## 2016-06-21 ENCOUNTER — Encounter (HOSPITAL_COMMUNITY)
Admission: RE | Admit: 2016-06-21 | Discharge: 2016-06-21 | Disposition: A | Discharge status: Home / Self Care | Payer: Self-pay | Source: Ambulatory Visit | Attending: Internal Medicine | Admitting: Internal Medicine

## 2016-06-23 ENCOUNTER — Encounter (HOSPITAL_COMMUNITY)
Admission: RE | Admit: 2016-06-23 | Discharge: 2016-06-23 | Disposition: A | Discharge status: Home / Self Care | Payer: Self-pay | Source: Ambulatory Visit | Attending: Internal Medicine | Admitting: Internal Medicine

## 2016-06-26 ENCOUNTER — Encounter (HOSPITAL_COMMUNITY)
Admission: RE | Admit: 2016-06-26 | Discharge: 2016-06-26 | Disposition: A | Discharge status: Home / Self Care | Payer: Self-pay | Source: Ambulatory Visit | Attending: Internal Medicine | Admitting: Internal Medicine

## 2016-06-28 ENCOUNTER — Encounter (HOSPITAL_COMMUNITY)
Admission: RE | Admit: 2016-06-28 | Discharge: 2016-06-28 | Disposition: A | Discharge status: Home / Self Care | Payer: Self-pay | Source: Ambulatory Visit | Attending: Internal Medicine | Admitting: Internal Medicine

## 2016-06-28 DIAGNOSIS — Z950 Presence of cardiac pacemaker: Secondary | ICD-10-CM | POA: Insufficient documentation

## 2016-07-03 ENCOUNTER — Encounter (HOSPITAL_COMMUNITY)
Admission: RE | Admit: 2016-07-03 | Discharge: 2016-07-03 | Disposition: A | Discharge status: Home / Self Care | Payer: Self-pay | Source: Ambulatory Visit | Attending: Internal Medicine | Admitting: Internal Medicine

## 2016-07-05 ENCOUNTER — Encounter (HOSPITAL_COMMUNITY)
Admission: RE | Admit: 2016-07-05 | Discharge: 2016-07-05 | Disposition: A | Discharge status: Home / Self Care | Payer: Self-pay | Source: Ambulatory Visit | Attending: Internal Medicine | Admitting: Internal Medicine

## 2016-07-07 ENCOUNTER — Encounter (HOSPITAL_COMMUNITY)
Admission: RE | Admit: 2016-07-07 | Discharge: 2016-07-07 | Disposition: A | Discharge status: Home / Self Care | Payer: Self-pay | Source: Ambulatory Visit | Attending: Internal Medicine | Admitting: Internal Medicine

## 2016-07-10 ENCOUNTER — Encounter (HOSPITAL_COMMUNITY)
Admission: RE | Admit: 2016-07-10 | Discharge: 2016-07-10 | Disposition: A | Discharge status: Home / Self Care | Payer: Self-pay | Source: Ambulatory Visit | Attending: Internal Medicine | Admitting: Internal Medicine

## 2016-07-12 ENCOUNTER — Encounter (HOSPITAL_COMMUNITY)
Admission: RE | Admit: 2016-07-12 | Discharge: 2016-07-12 | Disposition: A | Discharge status: Home / Self Care | Payer: Self-pay | Source: Ambulatory Visit | Attending: Internal Medicine | Admitting: Internal Medicine

## 2016-07-14 ENCOUNTER — Encounter (HOSPITAL_COMMUNITY)
Admission: RE | Admit: 2016-07-14 | Discharge: 2016-07-14 | Disposition: A | Discharge status: Home / Self Care | Payer: Self-pay | Source: Ambulatory Visit | Attending: Internal Medicine | Admitting: Internal Medicine

## 2016-07-17 ENCOUNTER — Encounter (HOSPITAL_COMMUNITY)
Admission: RE | Admit: 2016-07-17 | Discharge: 2016-07-17 | Disposition: A | Discharge status: Home / Self Care | Payer: Self-pay | Source: Ambulatory Visit | Attending: Internal Medicine | Admitting: Internal Medicine

## 2016-07-17 ENCOUNTER — Other Ambulatory Visit (HOSPITAL_COMMUNITY): Payer: Self-pay | Admitting: Internal Medicine

## 2016-07-19 ENCOUNTER — Encounter (HOSPITAL_COMMUNITY): Payer: Self-pay

## 2016-07-24 ENCOUNTER — Encounter (HOSPITAL_COMMUNITY)
Admission: RE | Admit: 2016-07-24 | Discharge: 2016-07-24 | Disposition: A | Discharge status: Home / Self Care | Payer: Self-pay | Source: Ambulatory Visit | Attending: Internal Medicine | Admitting: Internal Medicine

## 2016-07-26 ENCOUNTER — Encounter (HOSPITAL_COMMUNITY)
Admission: RE | Admit: 2016-07-26 | Discharge: 2016-07-26 | Disposition: A | Discharge status: Home / Self Care | Payer: Self-pay | Source: Ambulatory Visit | Attending: Internal Medicine | Admitting: Internal Medicine

## 2016-07-28 ENCOUNTER — Encounter (HOSPITAL_COMMUNITY)
Admission: RE | Admit: 2016-07-28 | Discharge: 2016-07-28 | Disposition: A | Discharge status: Home / Self Care | Payer: Self-pay | Source: Ambulatory Visit | Attending: Internal Medicine | Admitting: Internal Medicine

## 2016-07-28 DIAGNOSIS — Z950 Presence of cardiac pacemaker: Secondary | ICD-10-CM | POA: Insufficient documentation

## 2016-07-31 ENCOUNTER — Encounter (HOSPITAL_COMMUNITY): Payer: Self-pay

## 2016-08-02 ENCOUNTER — Encounter (HOSPITAL_COMMUNITY)
Admission: RE | Admit: 2016-08-02 | Discharge: 2016-08-02 | Disposition: A | Discharge status: Home / Self Care | Payer: Self-pay | Source: Ambulatory Visit | Attending: Internal Medicine | Admitting: Internal Medicine

## 2016-08-04 ENCOUNTER — Encounter (HOSPITAL_COMMUNITY)
Admission: RE | Admit: 2016-08-04 | Discharge: 2016-08-04 | Disposition: A | Discharge status: Home / Self Care | Payer: Self-pay | Source: Ambulatory Visit | Attending: Internal Medicine | Admitting: Internal Medicine

## 2016-08-07 ENCOUNTER — Encounter (HOSPITAL_COMMUNITY)
Admission: RE | Admit: 2016-08-07 | Discharge: 2016-08-07 | Disposition: A | Discharge status: Home / Self Care | Payer: Self-pay | Source: Ambulatory Visit | Attending: Internal Medicine | Admitting: Internal Medicine

## 2016-08-09 ENCOUNTER — Encounter (HOSPITAL_COMMUNITY)
Admission: RE | Admit: 2016-08-09 | Discharge: 2016-08-09 | Disposition: A | Discharge status: Home / Self Care | Payer: Self-pay | Source: Ambulatory Visit | Attending: Internal Medicine | Admitting: Internal Medicine

## 2016-08-10 ENCOUNTER — Ambulatory Visit (HOSPITAL_COMMUNITY)
Admission: RE | Admit: 2016-08-10 | Discharge: 2016-08-10 | Disposition: A | Discharge status: Home / Self Care | Payer: Medicare Other | Source: Ambulatory Visit | Attending: Internal Medicine | Admitting: Internal Medicine

## 2016-08-10 VITALS — BP 136/68 | HR 65 | Wt 126.2 lb

## 2016-08-10 DIAGNOSIS — I350 Nonrheumatic aortic (valve) stenosis: Secondary | ICD-10-CM | POA: Diagnosis not present

## 2016-08-10 DIAGNOSIS — E119 Type 2 diabetes mellitus without complications: Secondary | ICD-10-CM | POA: Insufficient documentation

## 2016-08-10 DIAGNOSIS — C9110 Chronic lymphocytic leukemia of B-cell type not having achieved remission: Secondary | ICD-10-CM | POA: Insufficient documentation

## 2016-08-10 DIAGNOSIS — I359 Nonrheumatic aortic valve disorder, unspecified: Secondary | ICD-10-CM | POA: Diagnosis not present

## 2016-08-10 DIAGNOSIS — E785 Hyperlipidemia, unspecified: Secondary | ICD-10-CM | POA: Insufficient documentation

## 2016-08-10 DIAGNOSIS — I442 Atrioventricular block, complete: Secondary | ICD-10-CM | POA: Insufficient documentation

## 2016-08-10 DIAGNOSIS — J45909 Unspecified asthma, uncomplicated: Secondary | ICD-10-CM | POA: Insufficient documentation

## 2016-08-10 DIAGNOSIS — Z953 Presence of xenogenic heart valve: Secondary | ICD-10-CM | POA: Diagnosis not present

## 2016-08-10 DIAGNOSIS — F329 Major depressive disorder, single episode, unspecified: Secondary | ICD-10-CM | POA: Diagnosis not present

## 2016-08-10 DIAGNOSIS — I447 Left bundle-branch block, unspecified: Secondary | ICD-10-CM | POA: Insufficient documentation

## 2016-08-10 DIAGNOSIS — Z7982 Long term (current) use of aspirin: Secondary | ICD-10-CM | POA: Diagnosis not present

## 2016-08-10 DIAGNOSIS — Z8547 Personal history of malignant neoplasm of testis: Secondary | ICD-10-CM | POA: Insufficient documentation

## 2016-08-10 DIAGNOSIS — Z950 Presence of cardiac pacemaker: Secondary | ICD-10-CM | POA: Diagnosis not present

## 2016-08-10 DIAGNOSIS — I5032 Chronic diastolic (congestive) heart failure: Secondary | ICD-10-CM

## 2016-08-10 DIAGNOSIS — R600 Localized edema: Secondary | ICD-10-CM | POA: Diagnosis not present

## 2016-08-10 DIAGNOSIS — Z9889 Other specified postprocedural states: Secondary | ICD-10-CM | POA: Insufficient documentation

## 2016-08-10 LAB — BASIC METABOLIC PANEL
Anion gap: 6 (ref 5–15)
BUN: 19 mg/dL (ref 6–20)
CALCIUM: 9.5 mg/dL (ref 8.9–10.3)
CHLORIDE: 105 mmol/L (ref 101–111)
CO2: 29 mmol/L (ref 22–32)
CREATININE: 1.1 mg/dL (ref 0.61–1.24)
GFR calc non Af Amer: >60 mL/min (ref >60)
Glucose, Bld: 89 mg/dL (ref 65–99)
Potassium: 4.4 mmol/L (ref 3.5–5.1)
SODIUM: 140 mmol/L (ref 135–145)

## 2016-08-10 NOTE — Addendum Note (Signed)
Encounter addended by: Scarlette Calico, RN on: 08/10/2016 11:25 AM<BR>    Actions taken: Visit diagnoses modified, Order list changed, Diagnosis association updated, Sign clinical note

## 2016-08-10 NOTE — Patient Instructions (Signed)
Labs today  We will contact you in 1 year to schedule your next appointment.  

## 2016-08-10 NOTE — Progress Notes (Signed)
CARDIOLOGY CLINIC NOTE  Patient ID: [name], male   DOB: Oct 08, 1940, 75 y.o.   MRN: [id number]  PCP: Dr. Tor Netters is 75 y/o male with h/o AS s/p bioprosthetic AVR (01/2013), DM2, CHB (s/p pacemaker) and hyperlipidemia. Bioprosthetic AVR with ligation of diagonal-PA fistula by Dr. Cyndia Bent on 02/17/13. He had post-operative complete heart block. A Medtronic dual chamber pacemaker was placed 02/19/13.   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 Leamon Arnt.  No real progression over past 3-4 years.   Continues to do well.  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  Denies CP or dyspnea. Still with ankle swelling.Taking lasix 40/40. Does use SBE prophylaxis. Doing CR maintenance program.   Echo 9/17: EF 60% AVR stable  Labs (6/14): Potassium 4.0 Creatinine 1.0   ROS: All systems negative except as listed in HPI, PMH and Problem List.  SH: Married, lives in [place], nonsmoker, works at the [employer].   Past Medical History  Diagnosis Date  . Aortic stenosis     moderate. Echo 3/10: EF 55-60%. mean gradient 25 mmHg. AVA 1.23cm2. Echo 3/11: Normal EF. mean AVA gradient 38mmHg. exercise myoview 2008: positive ECG. normal perfusion.  Bioprosthetic AVR with ligation of diagonal-PA fistula  . Exposure to secondhand smoke     extensive exposure  . Hyperlipidemia     treated  . Depression   . Asthma   . Heart murmur   . LBBB (left bundle branch block)   . Shortness of breath     exertion  . DM2 (diabetes mellitus, type 2)     fasting blood sugar - 90s  . Dizziness   . Testicular cancer     s/p XRT and surgical resection.  . Chronic lymphocytic leukemia     followed by Dr. Ouida Sills  - CHB post-op AoV surgery.  Medtronic dual chamber PCM.   Current Outpatient Prescriptions    Medication Sig Dispense Refill  . acetaminophen (TYLENOL) 500 MG tablet Take 500 mg by mouth 3 (three) times daily.     Marland Kitchen aspirin 81 MG tablet Take 1 tablet (81 mg total) by mouth daily. 30 tablet 11  . atorvastatin (LIPITOR) 40 MG tablet Take 40 mg by mouth daily.    . bifidobacterium infantis (ALIGN) capsule Take 1 capsule by mouth daily.    . busPIRone (BUSPAR) 15 MG tablet Take by mouth. One tablet by mouth every morning and two tablets by mouth every day at noon    . cholecalciferol (VITAMIN D) 1000 UNITS tablet Take 1,000 Units by mouth daily.    . Cholestyramine POWD Take 1 scoop by mouth 2 (two) times daily.     . cycloSPORINE (RESTASIS) 0.05 % ophthalmic emulsion Place 1 drop into both eyes 2 (two) times daily.     Marland Kitchen desloratadine (CLARINEX) 5 MG tablet Take 5 mg by mouth daily.     . fenofibrate 160 MG tablet Take 160 mg by mouth daily.     . fluticasone (FLONASE) 50 MCG/ACT nasal spray Place 2 sprays into the nose daily.     Marland Kitchen FORA V12 BLOOD GLUCOSE TEST test strip     . furosemide (LASIX) 40 MG tablet Take one (1) tablet (40 mg total) by mouth each morning. Take half (1/2) tablet (20 mg total) by mouth each evening. Take an additional  half (1/2) tablet (20 mg total) by mouth in the evening as needed for swelling.    . gabapentin (NEURONTIN) 300 MG capsule Take one (1) capsule (300 mg total) by mouth each morning. Take two (2) capsules (600 mg total) by mouth each afternoon.    Marland Kitchen LITETOUCH LANCETS MISC     . Multiple Vitamin (MULTIVITAMIN) tablet Take 1 tablet by mouth daily.     Vladimir Faster Glycol-Propyl Glycol (SYSTANE OP) Place 1 drop into both eyes 2 (two) times daily.    . potassium chloride SA (K-DUR,KLOR-CON) 20 MEQ tablet Take 20 mEq by mouth daily.    Marland Kitchen PROCTOZONE-HC 2.5 % rectal cream Place 1 application rectally 2 (two) times daily as needed.     . sertraline (ZOLOFT) 100 MG tablet Take 100 mg by mouth daily.     . traZODone (DESYREL) 50 MG tablet Take 150 mg by mouth at  bedtime.      No current facility-administered medications for this encounter.      PHYSICAL EXAM: Vitals:   08/10/16 1037  BP: 136/68  Pulse: 65  SpO2: 98%  Weight: 126 lb 4 oz (57.3 kg)    General:  well appearing. no resp difficulty HEENT: normal Neck: supple. JVP 5-6. No carotid bruit.  Cor: PMI nondisplaced. Regular rate & rhythm. 1/6 SEM RUSB.  Lungs: clear Abdomen: soft, nontender, nondistended. No hepatosplenomegaly. No bruits or masses. Good bowel sounds. Extremities: no cyanosis, clubbing, rash, trace edema +varicose veins Neuro: alert & orientedx3, cranial nerves grossly intact. moves all 4 extremities w/o difficulty. affect pleasant   ASSESSMENT & PLAN: 1. Status post bioprosthetic AVR: Stable. Doing well. Echo from 9/17 reviewed personally. Echo q2 years. He is compliant with SBE prophylaxis 2. Complete heart block: Medtronic PCM.  Followed by Dr. Caryl Comes.  3. Diagonal-PA fistula: Ligated at time of AVR.  4. Lower extremity edema - I think this is mostly dependent edema. Continue lasix at current dose can use more if swelling worsens. Suggested compression stockings.   BMET today. Followup 1 year  Glori Bickers MD 08/10/2016

## 2016-08-11 ENCOUNTER — Other Ambulatory Visit: Payer: Self-pay | Admitting: Nurse Practitioner

## 2016-08-11 ENCOUNTER — Encounter (HOSPITAL_COMMUNITY): Payer: Self-pay

## 2016-08-11 DIAGNOSIS — D3A8 Other benign neuroendocrine tumors: Secondary | ICD-10-CM

## 2016-08-11 DIAGNOSIS — K769 Liver disease, unspecified: Secondary | ICD-10-CM

## 2016-08-11 DIAGNOSIS — K6389 Other specified diseases of intestine: Secondary | ICD-10-CM

## 2016-08-14 ENCOUNTER — Ambulatory Visit
Admission: RE | Admit: 2016-08-14 | Discharge: 2016-08-14 | Disposition: A | Discharge status: Home / Self Care | Payer: Medicare Other | Source: Ambulatory Visit | Attending: Nurse Practitioner | Admitting: Nurse Practitioner

## 2016-08-14 ENCOUNTER — Encounter (HOSPITAL_COMMUNITY)
Admission: RE | Admit: 2016-08-14 | Discharge: 2016-08-14 | Disposition: A | Discharge status: Home / Self Care | Payer: Self-pay | Source: Ambulatory Visit | Attending: Internal Medicine | Admitting: Internal Medicine

## 2016-08-14 DIAGNOSIS — K6389 Other specified diseases of intestine: Secondary | ICD-10-CM

## 2016-08-14 DIAGNOSIS — D3A8 Other benign neuroendocrine tumors: Secondary | ICD-10-CM | POA: Diagnosis not present

## 2016-08-14 DIAGNOSIS — K769 Liver disease, unspecified: Secondary | ICD-10-CM

## 2016-08-14 DIAGNOSIS — K639 Disease of intestine, unspecified: Secondary | ICD-10-CM | POA: Diagnosis not present

## 2016-08-14 MED ORDER — IOPAMIDOL (ISOVUE-300) INJECTION 61%: 100.0000 mL | Freq: Once | INTRAVENOUS | Status: DC | PRN

## 2016-08-15 DIAGNOSIS — Z23 Encounter for immunization: Secondary | ICD-10-CM | POA: Diagnosis not present

## 2016-08-15 DIAGNOSIS — N401 Enlarged prostate with lower urinary tract symptoms: Secondary | ICD-10-CM | POA: Diagnosis not present

## 2016-08-15 DIAGNOSIS — K639 Disease of intestine, unspecified: Secondary | ICD-10-CM | POA: Diagnosis not present

## 2016-08-15 DIAGNOSIS — R1907 Generalized intra-abdominal and pelvic swelling, mass and lump: Secondary | ICD-10-CM | POA: Diagnosis not present

## 2016-08-15 DIAGNOSIS — K769 Liver disease, unspecified: Secondary | ICD-10-CM | POA: Diagnosis not present

## 2016-08-15 DIAGNOSIS — R42 Dizziness and giddiness: Secondary | ICD-10-CM | POA: Diagnosis not present

## 2016-08-16 ENCOUNTER — Encounter (HOSPITAL_COMMUNITY)
Admission: RE | Admit: 2016-08-16 | Discharge: 2016-08-16 | Disposition: A | Discharge status: Home / Self Care | Payer: Self-pay | Source: Ambulatory Visit | Attending: Internal Medicine | Admitting: Internal Medicine

## 2016-08-18 ENCOUNTER — Encounter (HOSPITAL_COMMUNITY)
Admission: RE | Admit: 2016-08-18 | Discharge: 2016-08-18 | Disposition: A | Discharge status: Home / Self Care | Payer: Self-pay | Source: Ambulatory Visit | Attending: Internal Medicine | Admitting: Internal Medicine

## 2016-08-23 ENCOUNTER — Encounter (HOSPITAL_COMMUNITY)
Admission: RE | Admit: 2016-08-23 | Discharge: 2016-08-23 | Disposition: A | Discharge status: Home / Self Care | Payer: Self-pay | Source: Ambulatory Visit | Attending: Internal Medicine | Admitting: Internal Medicine

## 2016-08-25 ENCOUNTER — Encounter (HOSPITAL_COMMUNITY)
Admission: RE | Admit: 2016-08-25 | Discharge: 2016-08-25 | Disposition: A | Discharge status: Home / Self Care | Payer: Self-pay | Source: Ambulatory Visit | Attending: Internal Medicine | Admitting: Internal Medicine

## 2016-08-30 ENCOUNTER — Encounter (HOSPITAL_COMMUNITY)
Admission: RE | Admit: 2016-08-30 | Discharge: 2016-08-30 | Disposition: A | Discharge status: Home / Self Care | Payer: Self-pay | Source: Ambulatory Visit | Attending: Internal Medicine | Admitting: Internal Medicine

## 2016-08-30 DIAGNOSIS — Z950 Presence of cardiac pacemaker: Secondary | ICD-10-CM | POA: Insufficient documentation

## 2016-09-01 ENCOUNTER — Encounter (HOSPITAL_COMMUNITY)
Admission: RE | Admit: 2016-09-01 | Discharge: 2016-09-01 | Disposition: A | Discharge status: Home / Self Care | Payer: Self-pay | Source: Ambulatory Visit | Attending: Internal Medicine | Admitting: Internal Medicine

## 2016-09-04 ENCOUNTER — Encounter (HOSPITAL_COMMUNITY)
Admission: RE | Admit: 2016-09-04 | Discharge: 2016-09-04 | Disposition: A | Discharge status: Home / Self Care | Payer: Self-pay | Source: Ambulatory Visit | Attending: Internal Medicine | Admitting: Internal Medicine

## 2016-09-06 ENCOUNTER — Encounter (HOSPITAL_COMMUNITY)
Admission: RE | Admit: 2016-09-06 | Discharge: 2016-09-06 | Disposition: A | Discharge status: Home / Self Care | Payer: Self-pay | Source: Ambulatory Visit | Attending: Internal Medicine | Admitting: Internal Medicine

## 2016-09-08 ENCOUNTER — Encounter (HOSPITAL_COMMUNITY)
Admission: RE | Admit: 2016-09-08 | Discharge: 2016-09-08 | Disposition: A | Discharge status: Home / Self Care | Payer: Self-pay | Source: Ambulatory Visit | Attending: Internal Medicine | Admitting: Internal Medicine

## 2016-09-11 ENCOUNTER — Encounter (HOSPITAL_COMMUNITY)
Admission: RE | Admit: 2016-09-11 | Discharge: 2016-09-11 | Disposition: A | Discharge status: Home / Self Care | Payer: Self-pay | Source: Ambulatory Visit | Attending: Internal Medicine | Admitting: Internal Medicine

## 2016-09-13 ENCOUNTER — Encounter (HOSPITAL_COMMUNITY): Payer: Self-pay

## 2016-09-15 ENCOUNTER — Encounter (HOSPITAL_COMMUNITY): Payer: Self-pay

## 2016-09-18 ENCOUNTER — Encounter (HOSPITAL_COMMUNITY)
Admission: RE | Admit: 2016-09-18 | Discharge: 2016-09-18 | Disposition: A | Discharge status: Home / Self Care | Payer: Self-pay | Source: Ambulatory Visit | Attending: Internal Medicine | Admitting: Internal Medicine

## 2016-09-20 ENCOUNTER — Encounter (HOSPITAL_COMMUNITY)
Admission: RE | Admit: 2016-09-20 | Discharge: 2016-09-20 | Disposition: A | Discharge status: Home / Self Care | Payer: Self-pay | Source: Ambulatory Visit | Attending: Internal Medicine | Admitting: Internal Medicine

## 2016-09-22 ENCOUNTER — Encounter (HOSPITAL_COMMUNITY): Payer: Self-pay

## 2016-09-25 ENCOUNTER — Encounter (HOSPITAL_COMMUNITY)
Admission: RE | Admit: 2016-09-25 | Discharge: 2016-09-25 | Disposition: A | Discharge status: Home / Self Care | Payer: Self-pay | Source: Ambulatory Visit | Attending: Internal Medicine | Admitting: Internal Medicine

## 2016-09-27 ENCOUNTER — Encounter (HOSPITAL_COMMUNITY)
Admission: RE | Admit: 2016-09-27 | Discharge: 2016-09-27 | Disposition: A | Discharge status: Home / Self Care | Payer: Self-pay | Source: Ambulatory Visit | Attending: Internal Medicine | Admitting: Internal Medicine

## 2016-09-29 ENCOUNTER — Encounter (HOSPITAL_COMMUNITY)
Admission: RE | Admit: 2016-09-29 | Discharge: 2016-09-29 | Disposition: A | Discharge status: Home / Self Care | Payer: Self-pay | Source: Ambulatory Visit | Attending: Internal Medicine | Admitting: Internal Medicine

## 2016-09-29 DIAGNOSIS — Z950 Presence of cardiac pacemaker: Secondary | ICD-10-CM | POA: Insufficient documentation

## 2016-10-02 ENCOUNTER — Encounter (HOSPITAL_COMMUNITY)
Admission: RE | Admit: 2016-10-02 | Discharge: 2016-10-02 | Disposition: A | Discharge status: Home / Self Care | Payer: Self-pay | Source: Ambulatory Visit | Attending: Internal Medicine | Admitting: Internal Medicine

## 2016-10-04 ENCOUNTER — Encounter (HOSPITAL_COMMUNITY)
Admission: RE | Admit: 2016-10-04 | Discharge: 2016-10-04 | Disposition: A | Discharge status: Home / Self Care | Payer: Self-pay | Source: Ambulatory Visit | Attending: Internal Medicine | Admitting: Internal Medicine

## 2016-10-06 ENCOUNTER — Encounter (HOSPITAL_COMMUNITY)
Admission: RE | Admit: 2016-10-06 | Discharge: 2016-10-06 | Disposition: A | Discharge status: Home / Self Care | Payer: Self-pay | Source: Ambulatory Visit | Attending: Internal Medicine | Admitting: Internal Medicine

## 2016-10-09 ENCOUNTER — Encounter (HOSPITAL_COMMUNITY)
Admission: RE | Admit: 2016-10-09 | Discharge: 2016-10-09 | Disposition: A | Discharge status: Home / Self Care | Payer: Self-pay | Source: Ambulatory Visit | Attending: Internal Medicine | Admitting: Internal Medicine

## 2016-10-10 DIAGNOSIS — C9191 Lymphoid leukemia, unspecified, in remission: Secondary | ICD-10-CM | POA: Diagnosis not present

## 2016-10-10 DIAGNOSIS — N200 Calculus of kidney: Secondary | ICD-10-CM | POA: Diagnosis not present

## 2016-10-10 DIAGNOSIS — N400 Enlarged prostate without lower urinary tract symptoms: Secondary | ICD-10-CM | POA: Diagnosis not present

## 2016-10-10 DIAGNOSIS — N182 Chronic kidney disease, stage 2 (mild): Secondary | ICD-10-CM | POA: Diagnosis not present

## 2016-10-10 DIAGNOSIS — Z79899 Other long term (current) drug therapy: Secondary | ICD-10-CM | POA: Diagnosis not present

## 2016-10-10 DIAGNOSIS — Z0000 Encounter for general adult medical examination without abnormal findings: Secondary | ICD-10-CM | POA: Diagnosis not present

## 2016-10-10 DIAGNOSIS — Z8547 Personal history of malignant neoplasm of testis: Secondary | ICD-10-CM | POA: Diagnosis not present

## 2016-10-10 DIAGNOSIS — Z23 Encounter for immunization: Secondary | ICD-10-CM | POA: Diagnosis not present

## 2016-10-10 DIAGNOSIS — E1122 Type 2 diabetes mellitus with diabetic chronic kidney disease: Secondary | ICD-10-CM | POA: Diagnosis not present

## 2016-10-10 DIAGNOSIS — E782 Mixed hyperlipidemia: Secondary | ICD-10-CM | POA: Diagnosis not present

## 2016-10-10 DIAGNOSIS — Z953 Presence of xenogenic heart valve: Secondary | ICD-10-CM | POA: Diagnosis not present

## 2016-10-10 DIAGNOSIS — I350 Nonrheumatic aortic (valve) stenosis: Secondary | ICD-10-CM | POA: Diagnosis not present

## 2016-10-11 ENCOUNTER — Encounter (HOSPITAL_COMMUNITY)
Admission: RE | Admit: 2016-10-11 | Discharge: 2016-10-11 | Disposition: A | Discharge status: Home / Self Care | Payer: Self-pay | Source: Ambulatory Visit | Attending: Internal Medicine | Admitting: Internal Medicine

## 2016-10-13 ENCOUNTER — Encounter (HOSPITAL_COMMUNITY)
Admission: RE | Admit: 2016-10-13 | Discharge: 2016-10-13 | Disposition: A | Discharge status: Home / Self Care | Payer: Self-pay | Source: Ambulatory Visit | Attending: Internal Medicine | Admitting: Internal Medicine

## 2016-10-16 ENCOUNTER — Encounter (HOSPITAL_COMMUNITY)
Admission: RE | Admit: 2016-10-16 | Discharge: 2016-10-16 | Disposition: A | Discharge status: Home / Self Care | Payer: Self-pay | Source: Ambulatory Visit | Attending: Internal Medicine | Admitting: Internal Medicine

## 2016-10-18 ENCOUNTER — Encounter (HOSPITAL_COMMUNITY)
Admission: RE | Admit: 2016-10-18 | Discharge: 2016-10-18 | Disposition: A | Discharge status: Home / Self Care | Payer: Self-pay | Source: Ambulatory Visit | Attending: Internal Medicine | Admitting: Internal Medicine

## 2016-10-20 ENCOUNTER — Other Ambulatory Visit (HOSPITAL_COMMUNITY): Payer: Self-pay | Admitting: Internal Medicine

## 2016-10-20 ENCOUNTER — Encounter (HOSPITAL_COMMUNITY)
Admission: RE | Admit: 2016-10-20 | Discharge: 2016-10-20 | Disposition: A | Discharge status: Home / Self Care | Payer: Self-pay | Source: Ambulatory Visit | Attending: Internal Medicine | Admitting: Internal Medicine

## 2016-10-21 ENCOUNTER — Other Ambulatory Visit (HOSPITAL_COMMUNITY): Payer: Self-pay | Admitting: Internal Medicine

## 2016-10-23 ENCOUNTER — Encounter (HOSPITAL_COMMUNITY)
Admission: RE | Admit: 2016-10-23 | Discharge: 2016-10-23 | Disposition: A | Discharge status: Home / Self Care | Payer: Self-pay | Source: Ambulatory Visit | Attending: Internal Medicine | Admitting: Internal Medicine

## 2016-10-25 ENCOUNTER — Encounter (HOSPITAL_COMMUNITY)
Admission: RE | Admit: 2016-10-25 | Discharge: 2016-10-25 | Disposition: A | Discharge status: Home / Self Care | Payer: Self-pay | Source: Ambulatory Visit | Attending: Internal Medicine | Admitting: Internal Medicine

## 2016-10-27 ENCOUNTER — Encounter (HOSPITAL_COMMUNITY)
Admission: RE | Admit: 2016-10-27 | Discharge: 2016-10-27 | Disposition: A | Discharge status: Home / Self Care | Payer: Self-pay | Source: Ambulatory Visit | Attending: Internal Medicine | Admitting: Internal Medicine

## 2016-10-27 DIAGNOSIS — Z950 Presence of cardiac pacemaker: Secondary | ICD-10-CM | POA: Insufficient documentation

## 2016-10-30 ENCOUNTER — Encounter (HOSPITAL_COMMUNITY)
Admission: RE | Admit: 2016-10-30 | Discharge: 2016-10-30 | Disposition: A | Discharge status: Home / Self Care | Payer: Self-pay | Source: Ambulatory Visit | Attending: Internal Medicine | Admitting: Internal Medicine

## 2016-11-01 ENCOUNTER — Encounter (HOSPITAL_COMMUNITY)
Admission: RE | Admit: 2016-11-01 | Discharge: 2016-11-01 | Disposition: A | Discharge status: Home / Self Care | Payer: Self-pay | Source: Ambulatory Visit | Attending: Internal Medicine | Admitting: Internal Medicine

## 2016-11-03 ENCOUNTER — Encounter (HOSPITAL_COMMUNITY)
Admission: RE | Admit: 2016-11-03 | Discharge: 2016-11-03 | Disposition: A | Discharge status: Home / Self Care | Payer: Self-pay | Source: Ambulatory Visit | Attending: Internal Medicine | Admitting: Internal Medicine

## 2016-11-06 ENCOUNTER — Encounter (HOSPITAL_COMMUNITY): Payer: Self-pay

## 2016-11-08 ENCOUNTER — Encounter (HOSPITAL_COMMUNITY)
Admission: RE | Admit: 2016-11-08 | Discharge: 2016-11-08 | Disposition: A | Discharge status: Home / Self Care | Payer: Self-pay | Source: Ambulatory Visit | Attending: Internal Medicine | Admitting: Internal Medicine

## 2016-11-10 ENCOUNTER — Encounter (HOSPITAL_COMMUNITY)
Admission: RE | Admit: 2016-11-10 | Discharge: 2016-11-10 | Disposition: A | Discharge status: Home / Self Care | Payer: Self-pay | Source: Ambulatory Visit | Attending: Internal Medicine | Admitting: Internal Medicine

## 2016-11-13 ENCOUNTER — Encounter (HOSPITAL_COMMUNITY)
Admission: RE | Admit: 2016-11-13 | Discharge: 2016-11-13 | Disposition: A | Discharge status: Home / Self Care | Payer: Self-pay | Source: Ambulatory Visit | Attending: Internal Medicine | Admitting: Internal Medicine

## 2016-11-15 ENCOUNTER — Encounter (HOSPITAL_COMMUNITY)
Admission: RE | Admit: 2016-11-15 | Discharge: 2016-11-15 | Disposition: A | Discharge status: Home / Self Care | Payer: Self-pay | Source: Ambulatory Visit | Attending: Internal Medicine | Admitting: Internal Medicine

## 2016-11-17 ENCOUNTER — Encounter (HOSPITAL_COMMUNITY)
Admission: RE | Admit: 2016-11-17 | Discharge: 2016-11-17 | Disposition: A | Discharge status: Home / Self Care | Payer: Self-pay | Source: Ambulatory Visit | Attending: Internal Medicine | Admitting: Internal Medicine

## 2016-11-20 ENCOUNTER — Encounter (HOSPITAL_COMMUNITY)
Admission: RE | Admit: 2016-11-20 | Discharge: 2016-11-20 | Disposition: A | Discharge status: Home / Self Care | Payer: Self-pay | Source: Ambulatory Visit | Attending: Internal Medicine | Admitting: Internal Medicine

## 2016-11-22 ENCOUNTER — Encounter (HOSPITAL_COMMUNITY)
Admission: RE | Admit: 2016-11-22 | Discharge: 2016-11-22 | Disposition: A | Discharge status: Home / Self Care | Payer: Self-pay | Source: Ambulatory Visit | Attending: Internal Medicine | Admitting: Internal Medicine

## 2016-11-24 ENCOUNTER — Encounter (HOSPITAL_COMMUNITY): Payer: Self-pay

## 2016-11-27 ENCOUNTER — Encounter (HOSPITAL_COMMUNITY)
Admission: RE | Admit: 2016-11-27 | Discharge: 2016-11-27 | Disposition: A | Discharge status: Home / Self Care | Payer: Self-pay | Source: Ambulatory Visit | Attending: Internal Medicine | Admitting: Internal Medicine

## 2016-11-27 DIAGNOSIS — Z950 Presence of cardiac pacemaker: Secondary | ICD-10-CM | POA: Insufficient documentation

## 2016-11-29 ENCOUNTER — Encounter (HOSPITAL_COMMUNITY)
Admission: RE | Admit: 2016-11-29 | Discharge: 2016-11-29 | Disposition: A | Discharge status: Home / Self Care | Payer: Self-pay | Source: Ambulatory Visit | Attending: Internal Medicine | Admitting: Internal Medicine

## 2016-12-01 ENCOUNTER — Encounter (HOSPITAL_COMMUNITY)
Admission: RE | Admit: 2016-12-01 | Discharge: 2016-12-01 | Disposition: A | Discharge status: Home / Self Care | Payer: Self-pay | Source: Ambulatory Visit | Attending: Internal Medicine | Admitting: Internal Medicine

## 2016-12-04 ENCOUNTER — Encounter (HOSPITAL_COMMUNITY)
Admission: RE | Admit: 2016-12-04 | Discharge: 2016-12-04 | Disposition: A | Discharge status: Home / Self Care | Payer: Self-pay | Source: Ambulatory Visit | Attending: Internal Medicine | Admitting: Internal Medicine

## 2016-12-06 ENCOUNTER — Encounter (HOSPITAL_COMMUNITY)
Admission: RE | Admit: 2016-12-06 | Discharge: 2016-12-06 | Disposition: A | Discharge status: Home / Self Care | Payer: Self-pay | Source: Ambulatory Visit | Attending: Internal Medicine | Admitting: Internal Medicine

## 2016-12-08 ENCOUNTER — Encounter (HOSPITAL_COMMUNITY): Payer: Self-pay

## 2016-12-11 ENCOUNTER — Encounter (HOSPITAL_COMMUNITY): Payer: Self-pay

## 2016-12-13 ENCOUNTER — Encounter (HOSPITAL_COMMUNITY)
Admission: RE | Admit: 2016-12-13 | Discharge: 2016-12-13 | Disposition: A | Discharge status: Home / Self Care | Payer: Self-pay | Source: Ambulatory Visit | Attending: Internal Medicine | Admitting: Internal Medicine

## 2016-12-15 ENCOUNTER — Encounter (HOSPITAL_COMMUNITY)
Admission: RE | Admit: 2016-12-15 | Discharge: 2016-12-15 | Disposition: A | Discharge status: Home / Self Care | Payer: Self-pay | Source: Ambulatory Visit | Attending: Internal Medicine | Admitting: Internal Medicine

## 2016-12-15 ENCOUNTER — Other Ambulatory Visit (HOSPITAL_COMMUNITY): Payer: Self-pay | Admitting: Internal Medicine

## 2016-12-18 ENCOUNTER — Encounter (HOSPITAL_COMMUNITY)
Admission: RE | Admit: 2016-12-18 | Discharge: 2016-12-18 | Disposition: A | Discharge status: Home / Self Care | Payer: Self-pay | Source: Ambulatory Visit | Attending: Internal Medicine | Admitting: Internal Medicine

## 2016-12-20 ENCOUNTER — Encounter (HOSPITAL_COMMUNITY)
Admission: RE | Admit: 2016-12-20 | Discharge: 2016-12-20 | Disposition: A | Discharge status: Home / Self Care | Payer: Self-pay | Source: Ambulatory Visit | Attending: Internal Medicine | Admitting: Internal Medicine

## 2016-12-22 ENCOUNTER — Encounter (HOSPITAL_COMMUNITY)
Admission: RE | Admit: 2016-12-22 | Discharge: 2016-12-22 | Disposition: A | Discharge status: Home / Self Care | Payer: Self-pay | Source: Ambulatory Visit | Attending: Internal Medicine | Admitting: Internal Medicine

## 2016-12-25 ENCOUNTER — Encounter (HOSPITAL_COMMUNITY): Payer: Self-pay

## 2016-12-27 ENCOUNTER — Encounter (HOSPITAL_COMMUNITY)
Admission: RE | Admit: 2016-12-27 | Discharge: 2016-12-27 | Disposition: A | Discharge status: Home / Self Care | Payer: Self-pay | Source: Ambulatory Visit | Attending: Internal Medicine | Admitting: Internal Medicine

## 2016-12-27 DIAGNOSIS — Z950 Presence of cardiac pacemaker: Secondary | ICD-10-CM | POA: Insufficient documentation

## 2016-12-29 ENCOUNTER — Encounter (HOSPITAL_COMMUNITY): Payer: Self-pay

## 2017-01-01 ENCOUNTER — Encounter (HOSPITAL_COMMUNITY)
Admission: RE | Admit: 2017-01-01 | Discharge: 2017-01-01 | Disposition: A | Discharge status: Home / Self Care | Payer: Self-pay | Source: Ambulatory Visit | Attending: Internal Medicine | Admitting: Internal Medicine

## 2017-01-03 ENCOUNTER — Encounter (HOSPITAL_COMMUNITY)
Admission: RE | Admit: 2017-01-03 | Discharge: 2017-01-03 | Disposition: A | Discharge status: Home / Self Care | Payer: Medicare Other | Source: Ambulatory Visit | Attending: Internal Medicine | Admitting: Internal Medicine

## 2017-01-05 ENCOUNTER — Encounter (HOSPITAL_COMMUNITY)
Admission: RE | Admit: 2017-01-05 | Discharge: 2017-01-05 | Disposition: A | Discharge status: Home / Self Care | Payer: Self-pay | Source: Ambulatory Visit | Attending: Internal Medicine | Admitting: Internal Medicine

## 2017-01-08 ENCOUNTER — Encounter (HOSPITAL_COMMUNITY)
Admission: RE | Admit: 2017-01-08 | Discharge: 2017-01-08 | Disposition: A | Discharge status: Home / Self Care | Payer: Self-pay | Source: Ambulatory Visit | Attending: Internal Medicine | Admitting: Internal Medicine

## 2017-01-10 ENCOUNTER — Encounter (HOSPITAL_COMMUNITY)
Admission: RE | Admit: 2017-01-10 | Discharge: 2017-01-10 | Disposition: A | Discharge status: Home / Self Care | Payer: Self-pay | Source: Ambulatory Visit | Attending: Internal Medicine | Admitting: Internal Medicine

## 2017-01-12 ENCOUNTER — Encounter (HOSPITAL_COMMUNITY): Payer: Self-pay

## 2017-01-15 ENCOUNTER — Encounter (HOSPITAL_COMMUNITY): Payer: Self-pay

## 2017-01-17 ENCOUNTER — Encounter (HOSPITAL_COMMUNITY)
Admission: RE | Admit: 2017-01-17 | Discharge: 2017-01-17 | Disposition: A | Discharge status: Home / Self Care | Payer: Self-pay | Source: Ambulatory Visit | Attending: Internal Medicine | Admitting: Internal Medicine

## 2017-01-19 ENCOUNTER — Encounter (HOSPITAL_COMMUNITY)
Admission: RE | Admit: 2017-01-19 | Discharge: 2017-01-19 | Disposition: A | Discharge status: Home / Self Care | Payer: Self-pay | Source: Ambulatory Visit | Attending: Internal Medicine | Admitting: Internal Medicine

## 2017-01-24 ENCOUNTER — Encounter (HOSPITAL_COMMUNITY): Payer: Self-pay

## 2017-01-26 ENCOUNTER — Encounter (HOSPITAL_COMMUNITY)
Admission: RE | Admit: 2017-01-26 | Discharge: 2017-01-26 | Disposition: A | Discharge status: Home / Self Care | Payer: Self-pay | Source: Ambulatory Visit | Attending: Internal Medicine | Admitting: Internal Medicine

## 2017-01-26 DIAGNOSIS — Z950 Presence of cardiac pacemaker: Secondary | ICD-10-CM | POA: Insufficient documentation

## 2017-01-29 ENCOUNTER — Encounter (HOSPITAL_COMMUNITY)
Admission: RE | Admit: 2017-01-29 | Discharge: 2017-01-29 | Disposition: A | Discharge status: Home / Self Care | Payer: Self-pay | Source: Ambulatory Visit | Attending: Internal Medicine | Admitting: Internal Medicine

## 2017-01-31 ENCOUNTER — Encounter (HOSPITAL_COMMUNITY)
Admission: RE | Admit: 2017-01-31 | Discharge: 2017-01-31 | Disposition: A | Discharge status: Home / Self Care | Payer: Self-pay | Source: Ambulatory Visit | Attending: Internal Medicine | Admitting: Internal Medicine

## 2017-02-02 ENCOUNTER — Encounter (HOSPITAL_COMMUNITY)
Admission: RE | Admit: 2017-02-02 | Discharge: 2017-02-02 | Disposition: A | Discharge status: Home / Self Care | Payer: Self-pay | Source: Ambulatory Visit | Attending: Internal Medicine | Admitting: Internal Medicine

## 2017-02-05 ENCOUNTER — Encounter (HOSPITAL_COMMUNITY)
Admission: RE | Admit: 2017-02-05 | Discharge: 2017-02-05 | Disposition: A | Discharge status: Home / Self Care | Payer: Self-pay | Source: Ambulatory Visit | Attending: Internal Medicine | Admitting: Internal Medicine

## 2017-02-12 ENCOUNTER — Encounter (HOSPITAL_COMMUNITY)
Admission: RE | Admit: 2017-02-12 | Discharge: 2017-02-12 | Disposition: A | Discharge status: Home / Self Care | Payer: Self-pay | Source: Ambulatory Visit | Attending: Internal Medicine | Admitting: Internal Medicine

## 2017-02-14 ENCOUNTER — Encounter (HOSPITAL_COMMUNITY)
Admission: RE | Admit: 2017-02-14 | Discharge: 2017-02-14 | Disposition: A | Discharge status: Home / Self Care | Payer: Self-pay | Source: Ambulatory Visit | Attending: Internal Medicine | Admitting: Internal Medicine

## 2017-02-16 ENCOUNTER — Encounter (HOSPITAL_COMMUNITY)
Admission: RE | Admit: 2017-02-16 | Discharge: 2017-02-16 | Disposition: A | Discharge status: Home / Self Care | Payer: Self-pay | Source: Ambulatory Visit | Attending: Internal Medicine | Admitting: Internal Medicine

## 2017-02-19 ENCOUNTER — Encounter (HOSPITAL_COMMUNITY)
Admission: RE | Admit: 2017-02-19 | Discharge: 2017-02-19 | Disposition: A | Discharge status: Home / Self Care | Payer: Self-pay | Source: Ambulatory Visit | Attending: Internal Medicine | Admitting: Internal Medicine

## 2017-02-21 ENCOUNTER — Encounter (HOSPITAL_COMMUNITY): Payer: Self-pay

## 2017-02-23 ENCOUNTER — Encounter (HOSPITAL_COMMUNITY): Payer: Self-pay

## 2017-02-26 ENCOUNTER — Encounter (HOSPITAL_COMMUNITY)
Admission: RE | Admit: 2017-02-26 | Discharge: 2017-02-26 | Disposition: A | Discharge status: Home / Self Care | Payer: Self-pay | Source: Ambulatory Visit | Attending: Internal Medicine | Admitting: Internal Medicine

## 2017-02-26 DIAGNOSIS — Z950 Presence of cardiac pacemaker: Secondary | ICD-10-CM | POA: Insufficient documentation

## 2017-03-02 ENCOUNTER — Encounter (HOSPITAL_COMMUNITY)
Admission: RE | Admit: 2017-03-02 | Discharge: 2017-03-02 | Disposition: A | Discharge status: Home / Self Care | Payer: Self-pay | Source: Ambulatory Visit | Attending: Internal Medicine | Admitting: Internal Medicine

## 2017-03-05 ENCOUNTER — Encounter (HOSPITAL_COMMUNITY)
Admission: RE | Admit: 2017-03-05 | Discharge: 2017-03-05 | Disposition: A | Discharge status: Home / Self Care | Payer: Self-pay | Source: Ambulatory Visit | Attending: Internal Medicine | Admitting: Internal Medicine

## 2017-03-07 ENCOUNTER — Encounter (HOSPITAL_COMMUNITY)
Admission: RE | Admit: 2017-03-07 | Discharge: 2017-03-07 | Disposition: A | Discharge status: Home / Self Care | Payer: Self-pay | Source: Ambulatory Visit | Attending: Internal Medicine | Admitting: Internal Medicine

## 2017-03-09 ENCOUNTER — Encounter (HOSPITAL_COMMUNITY)
Admission: RE | Admit: 2017-03-09 | Discharge: 2017-03-09 | Disposition: A | Discharge status: Home / Self Care | Payer: Self-pay | Source: Ambulatory Visit | Attending: Internal Medicine | Admitting: Internal Medicine

## 2017-03-12 ENCOUNTER — Encounter (HOSPITAL_COMMUNITY)
Admission: RE | Admit: 2017-03-12 | Discharge: 2017-03-12 | Disposition: A | Discharge status: Home / Self Care | Payer: Self-pay | Source: Ambulatory Visit | Attending: Internal Medicine | Admitting: Internal Medicine

## 2017-03-14 ENCOUNTER — Encounter (HOSPITAL_COMMUNITY)
Admission: RE | Admit: 2017-03-14 | Discharge: 2017-03-14 | Disposition: A | Discharge status: Home / Self Care | Payer: Self-pay | Source: Ambulatory Visit | Attending: Internal Medicine | Admitting: Internal Medicine

## 2017-03-16 ENCOUNTER — Encounter (HOSPITAL_COMMUNITY)
Admission: RE | Admit: 2017-03-16 | Discharge: 2017-03-16 | Disposition: A | Discharge status: Home / Self Care | Payer: Self-pay | Source: Ambulatory Visit | Attending: Internal Medicine | Admitting: Internal Medicine

## 2017-03-19 ENCOUNTER — Encounter (HOSPITAL_COMMUNITY)
Admission: RE | Admit: 2017-03-19 | Discharge: 2017-03-19 | Disposition: A | Discharge status: Home / Self Care | Payer: Self-pay | Source: Ambulatory Visit | Attending: Internal Medicine | Admitting: Internal Medicine

## 2017-03-21 ENCOUNTER — Encounter (HOSPITAL_COMMUNITY)
Admission: RE | Admit: 2017-03-21 | Discharge: 2017-03-21 | Disposition: A | Discharge status: Home / Self Care | Payer: Self-pay | Source: Ambulatory Visit | Attending: Internal Medicine | Admitting: Internal Medicine

## 2017-03-23 ENCOUNTER — Encounter (HOSPITAL_COMMUNITY)
Admission: RE | Admit: 2017-03-23 | Discharge: 2017-03-23 | Disposition: A | Discharge status: Home / Self Care | Payer: Self-pay | Source: Ambulatory Visit | Attending: Internal Medicine | Admitting: Internal Medicine

## 2017-03-26 ENCOUNTER — Encounter (HOSPITAL_COMMUNITY)
Admission: RE | Admit: 2017-03-26 | Discharge: 2017-03-26 | Disposition: A | Discharge status: Home / Self Care | Payer: Self-pay | Source: Ambulatory Visit | Attending: Internal Medicine | Admitting: Internal Medicine

## 2017-03-28 ENCOUNTER — Encounter (HOSPITAL_COMMUNITY): Payer: Self-pay

## 2017-03-28 DIAGNOSIS — Z950 Presence of cardiac pacemaker: Secondary | ICD-10-CM | POA: Insufficient documentation

## 2017-03-30 ENCOUNTER — Encounter (HOSPITAL_COMMUNITY)
Admission: RE | Admit: 2017-03-30 | Discharge: 2017-03-30 | Disposition: A | Discharge status: Home / Self Care | Payer: Self-pay | Source: Ambulatory Visit | Attending: Internal Medicine | Admitting: Internal Medicine

## 2017-04-02 ENCOUNTER — Encounter (HOSPITAL_COMMUNITY)
Admission: RE | Admit: 2017-04-02 | Discharge: 2017-04-02 | Disposition: A | Discharge status: Home / Self Care | Payer: Medicare Other | Source: Ambulatory Visit | Attending: Internal Medicine | Admitting: Internal Medicine

## 2017-04-04 ENCOUNTER — Encounter (HOSPITAL_COMMUNITY)
Admission: RE | Admit: 2017-04-04 | Discharge: 2017-04-04 | Disposition: A | Discharge status: Home / Self Care | Payer: Self-pay | Source: Ambulatory Visit | Attending: Internal Medicine | Admitting: Internal Medicine

## 2017-04-06 ENCOUNTER — Encounter (HOSPITAL_COMMUNITY)
Admission: RE | Admit: 2017-04-06 | Discharge: 2017-04-06 | Disposition: A | Discharge status: Home / Self Care | Payer: Self-pay | Source: Ambulatory Visit | Attending: Internal Medicine | Admitting: Internal Medicine

## 2017-04-09 ENCOUNTER — Encounter (HOSPITAL_COMMUNITY): Payer: Self-pay

## 2017-04-11 ENCOUNTER — Encounter (HOSPITAL_COMMUNITY)
Admission: RE | Admit: 2017-04-11 | Discharge: 2017-04-11 | Disposition: A | Discharge status: Home / Self Care | Payer: Self-pay | Source: Ambulatory Visit | Attending: Internal Medicine | Admitting: Internal Medicine

## 2017-04-13 ENCOUNTER — Encounter (HOSPITAL_COMMUNITY)
Admission: RE | Admit: 2017-04-13 | Discharge: 2017-04-13 | Disposition: A | Discharge status: Home / Self Care | Payer: Self-pay | Source: Ambulatory Visit | Attending: Internal Medicine | Admitting: Internal Medicine

## 2017-04-16 ENCOUNTER — Encounter (HOSPITAL_COMMUNITY)
Admission: RE | Admit: 2017-04-16 | Discharge: 2017-04-16 | Disposition: A | Discharge status: Home / Self Care | Payer: Self-pay | Source: Ambulatory Visit | Attending: Internal Medicine | Admitting: Internal Medicine

## 2017-04-18 ENCOUNTER — Encounter (HOSPITAL_COMMUNITY)
Admission: RE | Admit: 2017-04-18 | Discharge: 2017-04-18 | Disposition: A | Discharge status: Home / Self Care | Payer: Self-pay | Source: Ambulatory Visit | Attending: Internal Medicine | Admitting: Internal Medicine

## 2017-04-20 ENCOUNTER — Encounter (HOSPITAL_COMMUNITY): Payer: Self-pay

## 2017-04-23 ENCOUNTER — Encounter (HOSPITAL_COMMUNITY): Payer: Self-pay

## 2017-04-25 ENCOUNTER — Encounter (HOSPITAL_COMMUNITY)
Admission: RE | Admit: 2017-04-25 | Discharge: 2017-04-25 | Disposition: A | Discharge status: Home / Self Care | Payer: Medicare Other | Source: Ambulatory Visit | Attending: Internal Medicine | Admitting: Internal Medicine

## 2017-04-27 ENCOUNTER — Encounter (HOSPITAL_COMMUNITY)
Admission: RE | Admit: 2017-04-27 | Discharge: 2017-04-27 | Disposition: A | Discharge status: Home / Self Care | Payer: Self-pay | Source: Ambulatory Visit | Attending: Internal Medicine | Admitting: Internal Medicine

## 2017-05-02 ENCOUNTER — Encounter (HOSPITAL_COMMUNITY)
Admission: RE | Admit: 2017-05-02 | Discharge: 2017-05-02 | Disposition: A | Discharge status: Home / Self Care | Payer: Self-pay | Source: Ambulatory Visit | Attending: Internal Medicine | Admitting: Internal Medicine

## 2017-05-02 DIAGNOSIS — Z950 Presence of cardiac pacemaker: Secondary | ICD-10-CM | POA: Insufficient documentation

## 2017-05-04 ENCOUNTER — Encounter (HOSPITAL_COMMUNITY)
Admission: RE | Admit: 2017-05-04 | Discharge: 2017-05-04 | Disposition: A | Discharge status: Home / Self Care | Payer: Self-pay | Source: Ambulatory Visit | Attending: Internal Medicine | Admitting: Internal Medicine

## 2017-05-07 ENCOUNTER — Encounter (HOSPITAL_COMMUNITY): Payer: Self-pay

## 2017-05-09 ENCOUNTER — Encounter (HOSPITAL_COMMUNITY): Payer: Self-pay

## 2017-05-11 ENCOUNTER — Encounter (HOSPITAL_COMMUNITY)
Admission: RE | Admit: 2017-05-11 | Discharge: 2017-05-11 | Disposition: A | Discharge status: Home / Self Care | Payer: Self-pay | Source: Ambulatory Visit | Attending: Internal Medicine | Admitting: Internal Medicine

## 2017-05-14 ENCOUNTER — Encounter (HOSPITAL_COMMUNITY)
Admission: RE | Admit: 2017-05-14 | Discharge: 2017-05-14 | Disposition: A | Discharge status: Home / Self Care | Payer: Self-pay | Source: Ambulatory Visit | Attending: Internal Medicine | Admitting: Internal Medicine

## 2017-05-15 ENCOUNTER — Other Ambulatory Visit (HOSPITAL_COMMUNITY): Payer: Self-pay | Admitting: Internal Medicine

## 2017-05-16 ENCOUNTER — Encounter (HOSPITAL_COMMUNITY)
Admission: RE | Admit: 2017-05-16 | Discharge: 2017-05-16 | Disposition: A | Discharge status: Home / Self Care | Payer: Self-pay | Source: Ambulatory Visit | Attending: Internal Medicine | Admitting: Internal Medicine

## 2017-05-17 DIAGNOSIS — Z85828 Personal history of other malignant neoplasm of skin: Secondary | ICD-10-CM | POA: Diagnosis not present

## 2017-05-17 DIAGNOSIS — L570 Actinic keratosis: Secondary | ICD-10-CM | POA: Diagnosis not present

## 2017-05-17 DIAGNOSIS — L821 Other seborrheic keratosis: Secondary | ICD-10-CM | POA: Diagnosis not present

## 2017-05-17 DIAGNOSIS — D2262 Melanocytic nevi of left upper limb, including shoulder: Secondary | ICD-10-CM | POA: Diagnosis not present

## 2017-05-17 DIAGNOSIS — D2261 Melanocytic nevi of right upper limb, including shoulder: Secondary | ICD-10-CM | POA: Diagnosis not present

## 2017-05-17 DIAGNOSIS — D1801 Hemangioma of skin and subcutaneous tissue: Secondary | ICD-10-CM | POA: Diagnosis not present

## 2017-05-17 DIAGNOSIS — L110 Acquired keratosis follicularis: Secondary | ICD-10-CM | POA: Diagnosis not present

## 2017-05-17 DIAGNOSIS — D485 Neoplasm of uncertain behavior of skin: Secondary | ICD-10-CM | POA: Diagnosis not present

## 2017-05-17 DIAGNOSIS — D225 Melanocytic nevi of trunk: Secondary | ICD-10-CM | POA: Diagnosis not present

## 2017-05-18 ENCOUNTER — Encounter (HOSPITAL_COMMUNITY)
Admission: RE | Admit: 2017-05-18 | Discharge: 2017-05-18 | Disposition: A | Discharge status: Home / Self Care | Payer: Self-pay | Source: Ambulatory Visit | Attending: Internal Medicine | Admitting: Internal Medicine

## 2017-05-21 ENCOUNTER — Encounter (HOSPITAL_COMMUNITY)
Admission: RE | Admit: 2017-05-21 | Discharge: 2017-05-21 | Disposition: A | Discharge status: Home / Self Care | Payer: Self-pay | Source: Ambulatory Visit | Attending: Internal Medicine | Admitting: Internal Medicine

## 2017-05-23 ENCOUNTER — Encounter (HOSPITAL_COMMUNITY)
Admission: RE | Admit: 2017-05-23 | Discharge: 2017-05-23 | Disposition: A | Discharge status: Home / Self Care | Payer: Self-pay | Source: Ambulatory Visit | Attending: Internal Medicine | Admitting: Internal Medicine

## 2017-05-25 ENCOUNTER — Encounter (HOSPITAL_COMMUNITY)
Admission: RE | Admit: 2017-05-25 | Discharge: 2017-05-25 | Disposition: A | Discharge status: Home / Self Care | Payer: Self-pay | Source: Ambulatory Visit | Attending: Internal Medicine | Admitting: Internal Medicine

## 2017-05-28 ENCOUNTER — Encounter (HOSPITAL_COMMUNITY)
Admission: RE | Admit: 2017-05-28 | Discharge: 2017-05-28 | Disposition: A | Discharge status: Home / Self Care | Payer: Self-pay | Source: Ambulatory Visit | Attending: Internal Medicine | Admitting: Internal Medicine

## 2017-05-28 DIAGNOSIS — Z950 Presence of cardiac pacemaker: Secondary | ICD-10-CM | POA: Insufficient documentation

## 2017-05-30 ENCOUNTER — Encounter (HOSPITAL_COMMUNITY)
Admission: RE | Admit: 2017-05-30 | Discharge: 2017-05-30 | Disposition: A | Discharge status: Home / Self Care | Payer: Self-pay | Source: Ambulatory Visit | Attending: Internal Medicine | Admitting: Internal Medicine

## 2017-06-01 ENCOUNTER — Encounter (HOSPITAL_COMMUNITY)
Admission: RE | Admit: 2017-06-01 | Discharge: 2017-06-01 | Disposition: A | Discharge status: Home / Self Care | Payer: Self-pay | Source: Ambulatory Visit | Attending: Internal Medicine | Admitting: Internal Medicine

## 2017-06-04 ENCOUNTER — Encounter (HOSPITAL_COMMUNITY): Payer: Self-pay

## 2017-06-06 ENCOUNTER — Encounter (HOSPITAL_COMMUNITY): Payer: Self-pay

## 2017-06-08 ENCOUNTER — Encounter (HOSPITAL_COMMUNITY): Payer: Self-pay

## 2017-06-11 ENCOUNTER — Encounter (HOSPITAL_COMMUNITY): Payer: Self-pay

## 2017-06-13 ENCOUNTER — Encounter (HOSPITAL_COMMUNITY): Payer: Self-pay

## 2017-06-15 ENCOUNTER — Encounter (HOSPITAL_COMMUNITY)
Admission: RE | Admit: 2017-06-15 | Discharge: 2017-06-15 | Disposition: A | Discharge status: Home / Self Care | Payer: Self-pay | Source: Ambulatory Visit | Attending: Internal Medicine | Admitting: Internal Medicine

## 2017-06-18 ENCOUNTER — Encounter (HOSPITAL_COMMUNITY)
Admission: RE | Admit: 2017-06-18 | Discharge: 2017-06-18 | Disposition: A | Discharge status: Home / Self Care | Payer: Self-pay | Source: Ambulatory Visit | Attending: Internal Medicine | Admitting: Internal Medicine

## 2017-06-20 ENCOUNTER — Encounter (HOSPITAL_COMMUNITY)
Admission: RE | Admit: 2017-06-20 | Discharge: 2017-06-20 | Disposition: A | Discharge status: Home / Self Care | Payer: Medicare Other | Source: Ambulatory Visit | Attending: Internal Medicine | Admitting: Internal Medicine

## 2017-06-21 ENCOUNTER — Ambulatory Visit (INDEPENDENT_AMBULATORY_CARE_PROVIDER_SITE_OTHER): Payer: Medicare Other | Admitting: Internal Medicine

## 2017-06-21 VITALS — BP 110/62 | HR 68 | Ht 64.0 in | Wt 125.0 lb

## 2017-06-21 DIAGNOSIS — Z23 Encounter for immunization: Secondary | ICD-10-CM

## 2017-06-21 DIAGNOSIS — I442 Atrioventricular block, complete: Secondary | ICD-10-CM | POA: Diagnosis not present

## 2017-06-21 DIAGNOSIS — Z950 Presence of cardiac pacemaker: Secondary | ICD-10-CM | POA: Diagnosis not present

## 2017-06-21 LAB — CUP PACEART INCLINIC DEVICE CHECK
Battery Impedance: 227 Ohm
Battery Remaining Longevity: 122 mo
Battery Voltage: 2.79 V
Implantable Lead Implant Date: 20140625
Implantable Lead Location: 753860
Implantable Lead Model: 5076
Lead Channel Impedance Value: 476 Ohm
Lead Channel Pacing Threshold Amplitude: 0.5 V
Lead Channel Pacing Threshold Amplitude: 0.625 V
Lead Channel Pacing Threshold Amplitude: 0.75 V
Lead Channel Pacing Threshold Pulse Width: 0.4 ms
Lead Channel Pacing Threshold Pulse Width: 0.4 ms
Lead Channel Setting Pacing Amplitude: 2 V
Lead Channel Setting Sensing Sensitivity: 2 mV
MDC IDC LEAD IMPLANT DT: 20140625
MDC IDC LEAD LOCATION: 753859
MDC IDC MSMT LEADCHNL RA IMPEDANCE VALUE: 461 Ohm
MDC IDC MSMT LEADCHNL RA SENSING INTR AMPL: 4 mV
MDC IDC MSMT LEADCHNL RV PACING THRESHOLD AMPLITUDE: 0.75 V
MDC IDC MSMT LEADCHNL RV PACING THRESHOLD PULSEWIDTH: 0.4 ms
MDC IDC MSMT LEADCHNL RV PACING THRESHOLD PULSEWIDTH: 0.4 ms
MDC IDC PG IMPLANT DT: 20140625
MDC IDC SESS DTM: 20181025162243
MDC IDC SET LEADCHNL RA PACING AMPLITUDE: 1.5 V
MDC IDC SET LEADCHNL RV PACING PULSEWIDTH: 0.4 ms
MDC IDC STAT BRADY AP VP PERCENT: 18 %
MDC IDC STAT BRADY AP VS PERCENT: 0 %
MDC IDC STAT BRADY AS VP PERCENT: 82 %
MDC IDC STAT BRADY AS VS PERCENT: 0 %

## 2017-06-21 NOTE — Progress Notes (Signed)
Patient Care Team: Jonathon Jordan, MD as PCP - General (Family Medicine) Alessandra Grout, MD (Inactive) as Referring Physician (Family Medicine) Bensimhon, Shaune Pascal, MD as Attending Physician (Cardiology) Leslie Andrea, MD as Referring Physician (Internal Medicine)   HPI  [name] is a 76 y.o. male Seen for pacemaker followup.  Underwent PPM implantation 6/14 for CHB following AVR bioprothesis   Ejection fraction postoperatively an ejection fraction 55-60%  He has chronic mild edema and chronic mild shortness of breath. There is no chest pain. He continues to fish vigorously.       Past Medical History:  Diagnosis Date  . Aortic stenosis    s/p AVR 6/14  . Asthma   . Atrioventricular block, complete ([employer])    a. s/p MDT dual chamber PPM 2014  . Chronic lymphocytic leukemia ([place])    followed by Dr. Ouida Sills  . Depression   . DM2 (diabetes mellitus, type 2) (H[facility])    fasting blood sugar - 90s  . Exposure to secondhand smoke    extensive exposure  . Hyperlipidemia    treated  . LBBB (left bundle branch block)   . Testicular cancer [facility])    s/p XRT and surgical resection.    Past Surgical History:  Procedure Laterality Date  . AORTIC VALVE REPLACEMENT N/A 02/17/2013   Procedure: AORTIC VALVE REPLACEMENT (AVR);  Surgeon: Gaye Pollack, MD;  Location: [place];  Service: Open Heart Surgery;  Laterality: N/A;  . CYST REMOVAL NECK    . INNER EAR SURGERY    . INTRAOPERATIVE TRANSESOPHAGEAL ECHOCARDIOGRAM N/A 02/17/2013   Procedure: INTRAOPERATIVE TRANSESOPHAGEAL ECHOCARDIOGRAM;  Surgeon: Gaye Pollack, MD;  [facility];  Service: Open Heart Surgery;  Laterality: N/A;  . LEFT AND RIGHT HEART CATHETERIZATION WITH CORONARY ANGIOGRAM N/A 12/25/2012   Procedure: LEFT AND RIGHT HEART CATHETERIZATION WITH CORONARY ANGIOGRAM;  Surgeon: Jolaine Artist, MD;  Location: [facility] CATH LAB;  Service: Cardiovascular;  Laterality: N/A;  . PERMANENT PACEMAKER INSERTION N/A  02/19/2013   Procedure: PERMANENT PACEMAKER INSERTION;  Surgeon: Deboraha Sprang, MD;  Location[facility] C[facility] LAB;  Service: Cardiovascular;  Laterality: N/A;  . stapendectomy  07/30/02   right  . XRT and surgical resection     s/p. 10 years ago    Current Outpatient Prescriptions  Medication Sig Dispense Refill  . acetaminophen (TYLENOL) 500 MG tablet Take 500 mg by mouth 3 (three) times daily.     Marland Kitchen aspirin 81 MG tablet Take 1 tablet (81 mg total) by mouth daily. 30 tablet 11  . atorvastatin (LIPITOR) 40 MG tablet Take 40 mg by mouth daily.    . bifidobacterium infantis (ALIGN) capsule Take 1 capsule by mouth daily.    . busPIRone (BUSPAR) 15 MG tablet Take by mouth. One tablet by mouth every morning and two tablets by mouth every day at noon    . cholecalciferol (VITAMIN D) 1000 UNITS tablet Take 1,000 Units by mouth daily.    . Cholestyramine POWD Take 1 scoop by mouth 2 (two) times daily.     . cycloSPORINE (RESTASIS) 0.05 % ophthalmic emulsion Place 1 drop into both eyes 2 (two) times daily.     Marland Kitchen desloratadine (CLARINEX) 5 MG tablet Take 5 mg by mouth daily.     . fenofibrate 160 MG tablet Take 160 mg by mouth daily.     . fluticasone (FLONASE) 50 MCG/ACT nasal spray Place 2 sprays into the nose daily.     Marland Kitchen  FORA V12 BLOOD GLUCOSE TEST test strip     . furosemide (LASIX) 40 MG tablet Take one (1) tablet (40 mg total) by mouth each morning. Take half (1/2) tablet (20 mg total) by mouth each evening. Take an additional half (1/2) tablet (20 mg total) by mouth in the evening as needed for swelling.    . gabapentin (NEURONTIN) 300 MG capsule Take one (1) capsule (300 mg total) by mouth each morning. Take two (2) capsules (600 mg total) by mouth each afternoon.    Marland Kitchen LITETOUCH LANCETS MISC     . Multiple Vitamin (MULTIVITAMIN) tablet Take 1 tablet by mouth daily.     Vladimir Faster Glycol-Propyl Glycol (SYSTANE OP) Place 1 drop into both eyes 2 (two) times daily.    . potassium chloride SA  (K-DUR,KLOR-CON) 20 MEQ tablet TAKE ONE TABLET EVERY DAY 90 tablet 3  . PROCTOZONE-HC 2.5 % rectal cream Place 1 application rectally 2 (two) times daily as needed.     . sertraline (ZOLOFT) 100 MG tablet Take 100 mg by mouth daily.     . traZODone (DESYREL) 50 MG tablet Take 150 mg by mouth at bedtime.      No current facility-administered medications for this visit.     Allergies  Allergen Reactions  . Banana Anaphylaxis  . Penicillins Rash    Review of Systems negative except from HPI and PMH  Physical Exam BP 110/62   Pulse 68   Ht 5\' 4"  (1.626 m)   Wt 125 lb (56.7 kg)   BMI 21.46 kg/m   Well developed and well nourished in no acute distress HENT normal E scleral and icterus clear Neck Supple JVP flat. Carotids brisk and full without bruits clearn Device pocket well healed; without hematoma or erythema.  There is no tethering regular rate and rhythm, 3/6 systolic murmur Soft with active bowel sounds No clubbing cyanosis 2>1  Edema Alert and oriented, grossly normal motor and sensory function Skin Warm and Dry  ECG demonstrates P. synchronous pacing Assessment and  Plan     Complete heart block  Pacemaker -Medtronic  HFpEF        Mild volume overload Normal device function

## 2017-06-21 NOTE — Patient Instructions (Addendum)
Your physician recommends that you continue on your current medications as directed. Please refer to the Current Medication list given to you today.   Remote monitoring is used to monitor your Pacemaker  from home. This monitoring reduces the number of office visits required to check your device to one time per year. It allows [place] to keep an eye on the functioning of your device to ensure it is working properly. You are scheduled for a device check from home on 09-20-17  . You may send your transmission at any time that day. If you have a wireless device, the transmission will be sent automatically. After your physician reviews your transmission, you will receive a postcard with your next transmission date.   Your physician wants you to follow-up [id number] WITH DR Jeffie Pollock AND  October WITH DR Gari Crown will receive a reminder letter in the mail two months in advance. If you don't receive a letter, please call our office to schedule the follow-up appointment.

## 2017-06-22 ENCOUNTER — Encounter (HOSPITAL_COMMUNITY)
Admission: RE | Admit: 2017-06-22 | Discharge: 2017-06-22 | Disposition: A | Discharge status: Home / Self Care | Payer: Self-pay | Source: Ambulatory Visit | Attending: Internal Medicine | Admitting: Internal Medicine

## 2017-06-25 ENCOUNTER — Encounter (HOSPITAL_COMMUNITY)
Admission: RE | Admit: 2017-06-25 | Discharge: 2017-06-25 | Disposition: A | Discharge status: Home / Self Care | Payer: Self-pay | Source: Ambulatory Visit | Attending: Internal Medicine | Admitting: Internal Medicine

## 2017-06-27 ENCOUNTER — Encounter (HOSPITAL_COMMUNITY)
Admission: RE | Admit: 2017-06-27 | Discharge: 2017-06-27 | Disposition: A | Discharge status: Home / Self Care | Payer: Self-pay | Source: Ambulatory Visit | Attending: Internal Medicine | Admitting: Internal Medicine

## 2017-06-29 ENCOUNTER — Encounter (HOSPITAL_COMMUNITY)
Admission: RE | Admit: 2017-06-29 | Discharge: 2017-06-29 | Disposition: A | Discharge status: Home / Self Care | Payer: Federal, State, Local not specified - PPO | Source: Ambulatory Visit | Attending: Internal Medicine | Admitting: Internal Medicine

## 2017-06-29 DIAGNOSIS — Z950 Presence of cardiac pacemaker: Secondary | ICD-10-CM | POA: Insufficient documentation

## 2017-07-02 ENCOUNTER — Encounter (HOSPITAL_COMMUNITY): Payer: Self-pay

## 2017-07-04 ENCOUNTER — Encounter (HOSPITAL_COMMUNITY)
Admission: RE | Admit: 2017-07-04 | Discharge: 2017-07-04 | Disposition: A | Discharge status: Home / Self Care | Payer: Self-pay | Source: Ambulatory Visit | Attending: Internal Medicine | Admitting: Internal Medicine

## 2017-07-06 ENCOUNTER — Encounter (HOSPITAL_COMMUNITY)
Admission: RE | Admit: 2017-07-06 | Discharge: 2017-07-06 | Disposition: A | Discharge status: Home / Self Care | Payer: Self-pay | Source: Ambulatory Visit | Attending: Internal Medicine | Admitting: Internal Medicine

## 2017-07-09 ENCOUNTER — Encounter (HOSPITAL_COMMUNITY)
Admission: RE | Admit: 2017-07-09 | Discharge: 2017-07-09 | Disposition: A | Discharge status: Home / Self Care | Payer: Federal, State, Local not specified - PPO | Source: Ambulatory Visit | Attending: Internal Medicine | Admitting: Internal Medicine

## 2017-07-11 ENCOUNTER — Encounter (HOSPITAL_COMMUNITY)
Admission: RE | Admit: 2017-07-11 | Discharge: 2017-07-11 | Disposition: A | Discharge status: Home / Self Care | Payer: Self-pay | Source: Ambulatory Visit | Attending: Internal Medicine | Admitting: Internal Medicine

## 2017-07-13 ENCOUNTER — Encounter (HOSPITAL_COMMUNITY)
Admission: RE | Admit: 2017-07-13 | Discharge: 2017-07-13 | Disposition: A | Discharge status: Home / Self Care | Payer: Medicare Other | Source: Ambulatory Visit | Attending: Internal Medicine | Admitting: Internal Medicine

## 2017-07-16 ENCOUNTER — Encounter (HOSPITAL_COMMUNITY)
Admission: RE | Admit: 2017-07-16 | Discharge: 2017-07-16 | Disposition: A | Discharge status: Home / Self Care | Payer: Self-pay | Source: Ambulatory Visit | Attending: Internal Medicine | Admitting: Internal Medicine

## 2017-07-18 ENCOUNTER — Encounter (HOSPITAL_COMMUNITY)
Admission: RE | Admit: 2017-07-18 | Discharge: 2017-07-18 | Disposition: A | Discharge status: Home / Self Care | Payer: Self-pay | Source: Ambulatory Visit | Attending: Internal Medicine | Admitting: Internal Medicine

## 2017-07-23 ENCOUNTER — Encounter (HOSPITAL_COMMUNITY): Payer: Self-pay

## 2017-07-24 DIAGNOSIS — D3A098 Benign carcinoid tumors of other sites: Secondary | ICD-10-CM | POA: Diagnosis not present

## 2017-07-24 DIAGNOSIS — Z8547 Personal history of malignant neoplasm of testis: Secondary | ICD-10-CM | POA: Diagnosis not present

## 2017-07-24 DIAGNOSIS — E1122 Type 2 diabetes mellitus with diabetic chronic kidney disease: Secondary | ICD-10-CM | POA: Diagnosis not present

## 2017-07-24 DIAGNOSIS — K625 Hemorrhage of anus and rectum: Secondary | ICD-10-CM | POA: Diagnosis not present

## 2017-07-24 DIAGNOSIS — K6289 Other specified diseases of anus and rectum: Secondary | ICD-10-CM | POA: Diagnosis not present

## 2017-07-24 DIAGNOSIS — Z953 Presence of xenogenic heart valve: Secondary | ICD-10-CM | POA: Diagnosis not present

## 2017-07-24 DIAGNOSIS — I350 Nonrheumatic aortic (valve) stenosis: Secondary | ICD-10-CM | POA: Diagnosis not present

## 2017-07-25 ENCOUNTER — Encounter (HOSPITAL_COMMUNITY)
Admission: RE | Admit: 2017-07-25 | Discharge: 2017-07-25 | Disposition: A | Discharge status: Home / Self Care | Payer: Self-pay | Source: Ambulatory Visit | Attending: Internal Medicine | Admitting: Internal Medicine

## 2017-07-27 ENCOUNTER — Encounter (HOSPITAL_COMMUNITY)
Admission: RE | Admit: 2017-07-27 | Discharge: 2017-07-27 | Disposition: A | Discharge status: Home / Self Care | Payer: Self-pay | Source: Ambulatory Visit | Attending: Internal Medicine | Admitting: Internal Medicine

## 2017-07-30 ENCOUNTER — Encounter (HOSPITAL_COMMUNITY): Payer: Self-pay

## 2017-07-30 DIAGNOSIS — Z950 Presence of cardiac pacemaker: Secondary | ICD-10-CM | POA: Insufficient documentation

## 2017-07-31 ENCOUNTER — Other Ambulatory Visit: Payer: Self-pay | Admitting: Internal Medicine

## 2017-07-31 DIAGNOSIS — K6389 Other specified diseases of intestine: Secondary | ICD-10-CM

## 2017-07-31 DIAGNOSIS — N400 Enlarged prostate without lower urinary tract symptoms: Secondary | ICD-10-CM

## 2017-07-31 DIAGNOSIS — K769 Liver disease, unspecified: Secondary | ICD-10-CM

## 2017-08-01 ENCOUNTER — Encounter (HOSPITAL_COMMUNITY)
Admission: RE | Admit: 2017-08-01 | Discharge: 2017-08-01 | Disposition: A | Discharge status: Home / Self Care | Payer: Self-pay | Source: Ambulatory Visit | Attending: Internal Medicine | Admitting: Internal Medicine

## 2017-08-03 ENCOUNTER — Encounter (HOSPITAL_COMMUNITY): Payer: Self-pay

## 2017-08-06 ENCOUNTER — Encounter (HOSPITAL_COMMUNITY): Payer: Self-pay

## 2017-08-07 ENCOUNTER — Ambulatory Visit
Admission: RE | Admit: 2017-08-07 | Discharge: 2017-08-07 | Disposition: A | Discharge status: Home / Self Care | Payer: Medicare Other | Source: Ambulatory Visit | Attending: Internal Medicine | Admitting: Internal Medicine

## 2017-08-07 DIAGNOSIS — K6389 Other specified diseases of intestine: Secondary | ICD-10-CM

## 2017-08-07 DIAGNOSIS — K769 Liver disease, unspecified: Secondary | ICD-10-CM

## 2017-08-07 DIAGNOSIS — K7689 Other specified diseases of liver: Secondary | ICD-10-CM | POA: Diagnosis not present

## 2017-08-07 DIAGNOSIS — N400 Enlarged prostate without lower urinary tract symptoms: Secondary | ICD-10-CM

## 2017-08-07 MED ORDER — IOPAMIDOL (ISOVUE-300) INJECTION 61%
100.0000 mL | Freq: Once | INTRAVENOUS | Status: AC | PRN
  Administered 2017-08-07: 100 mL via INTRAVENOUS

## 2017-08-08 ENCOUNTER — Encounter (HOSPITAL_COMMUNITY): Payer: Self-pay

## 2017-08-10 ENCOUNTER — Encounter (HOSPITAL_COMMUNITY): Payer: Self-pay

## 2017-08-13 ENCOUNTER — Encounter (HOSPITAL_COMMUNITY)
Admission: RE | Admit: 2017-08-13 | Discharge: 2017-08-13 | Disposition: A | Discharge status: Home / Self Care | Payer: Self-pay | Source: Ambulatory Visit | Attending: Internal Medicine | Admitting: Internal Medicine

## 2017-08-14 DIAGNOSIS — K639 Disease of intestine, unspecified: Secondary | ICD-10-CM | POA: Diagnosis not present

## 2017-08-15 ENCOUNTER — Encounter (HOSPITAL_COMMUNITY)
Admission: RE | Admit: 2017-08-15 | Discharge: 2017-08-15 | Disposition: A | Discharge status: Home / Self Care | Payer: Medicare Other | Source: Ambulatory Visit | Attending: Internal Medicine | Admitting: Internal Medicine

## 2017-08-17 ENCOUNTER — Encounter (HOSPITAL_COMMUNITY)
Admission: RE | Admit: 2017-08-17 | Discharge: 2017-08-17 | Disposition: A | Discharge status: Home / Self Care | Payer: Self-pay | Source: Ambulatory Visit | Attending: Internal Medicine | Admitting: Internal Medicine

## 2017-08-22 ENCOUNTER — Encounter (HOSPITAL_COMMUNITY)
Admission: RE | Admit: 2017-08-22 | Discharge: 2017-08-22 | Disposition: A | Discharge status: Home / Self Care | Payer: Medicare Other | Source: Ambulatory Visit | Attending: Internal Medicine | Admitting: Internal Medicine

## 2017-08-24 ENCOUNTER — Encounter (HOSPITAL_COMMUNITY)
Admission: RE | Admit: 2017-08-24 | Discharge: 2017-08-24 | Disposition: A | Discharge status: Home / Self Care | Payer: Self-pay | Source: Ambulatory Visit | Attending: Internal Medicine | Admitting: Internal Medicine

## 2017-08-27 ENCOUNTER — Encounter (HOSPITAL_COMMUNITY): Payer: Self-pay

## 2017-08-29 ENCOUNTER — Encounter (HOSPITAL_COMMUNITY)
Admission: RE | Admit: 2017-08-29 | Discharge: 2017-08-29 | Disposition: A | Discharge status: Home / Self Care | Payer: Self-pay | Source: Ambulatory Visit | Attending: Internal Medicine | Admitting: Internal Medicine

## 2017-08-29 DIAGNOSIS — Z950 Presence of cardiac pacemaker: Secondary | ICD-10-CM | POA: Insufficient documentation

## 2017-08-31 ENCOUNTER — Encounter (HOSPITAL_COMMUNITY)
Admission: RE | Admit: 2017-08-31 | Discharge: 2017-08-31 | Disposition: A | Discharge status: Home / Self Care | Payer: Self-pay | Source: Ambulatory Visit | Attending: Internal Medicine | Admitting: Internal Medicine

## 2017-09-03 ENCOUNTER — Encounter (HOSPITAL_COMMUNITY)
Admission: RE | Admit: 2017-09-03 | Discharge: 2017-09-03 | Disposition: A | Discharge status: Home / Self Care | Payer: Self-pay | Source: Ambulatory Visit | Attending: Internal Medicine | Admitting: Internal Medicine

## 2017-09-07 ENCOUNTER — Encounter (HOSPITAL_COMMUNITY)
Admission: RE | Admit: 2017-09-07 | Discharge: 2017-09-07 | Disposition: A | Discharge status: Home / Self Care | Payer: Self-pay | Source: Ambulatory Visit | Attending: Internal Medicine | Admitting: Internal Medicine

## 2017-09-10 ENCOUNTER — Ambulatory Visit: Payer: Self-pay | Admitting: Surgery

## 2017-09-10 ENCOUNTER — Encounter (HOSPITAL_COMMUNITY): Payer: Self-pay

## 2017-09-10 DIAGNOSIS — K629 Disease of anus and rectum, unspecified: Secondary | ICD-10-CM | POA: Diagnosis not present

## 2017-09-10 NOTE — H&P (Signed)
History of Present Illness [name] Colton Skinner; 09/10/2017 11:20 AM) Patient words: CC: Anal polyp - referred from Dr. Oletta Lamas with GI HPI: [name] is a pleasant 77yoM with hx of  anal canal polyp he has noticed for at least 5 years.  This has grown in size dramatically he says in the last 1-1.27yrs. he notices a prolapsing piece of tissue from the anal canal with bowel movements that occasionally bleeds particularly when his stool is firmer.  He denies any anal canal pain.  He denies history of receptive anal intercourse.  He denies history of being treated for HPV in the past. He is not taking a daily fiber supplement. He does have issues with fecal leakage particularly stool and liquid.  He also has problems with incontinence if he can't immediately get to bathroom when the urge hits him.  He denies history of anorectal procedures.  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  He reports having had this found 5 years ago but that it has been stable in size and his only treatment as observation at this time.  He states his last colonoscopy was approximately 5 years ago with Dr. Oletta Lamas and he believes there is no pertinent findings -although this report is not an Epic at this time.  He does report a personal history of colon polyps in the past on prior colonoscopies.  He was recently seen by Dr. Oletta Lamas and referred to me for this anal canal lesion.  He states that Dr. Oletta Lamas is planning to perform a FIT test those having to defer colonoscopy at this time.  PMH: HTN, HLD (well controlled on statin), AS s/p AVR, pacemaker - Dr. Virl Skinner is his cardiologist.  Testicular cancer status post surgery and radiation.  PSH: Orchiectomy for testicular cancer  FHx: Denies FHx of GI malignancy  Social: Denies use of tobacco/EtOH/drugs  ROS: A comprehensive 10 system review of systems was completed with the patient and pertinent findings as  noted above.  The patient is a 77 year old male. male.   Past Surgical History ([name], [place]; 09/10/2017 10:40 AM) Breast Biopsy   Left. Oral Surgery   Tonsillectomy   Valve Replacement    Allergies ([name], [place]; 09/10/2017 10:41 AM) Penicillins   BANANAS   Allergies Reconciled    Medication History ([name], [place]; 09/10/2017 10:43 AM) Atorvastatin Calcium  (40MG  Tablet, Oral) Active. BusPIRone HCl  (15MG  Tablet, Oral) Active. Fluticasone Propionate  (50MCG/ACT Suspension, Nasal) Active. Furosemide  (40MG  Tablet, Oral) Active. Gabapentin  (300MG  Capsule, Oral) Active. Sertraline HCl  (100MG  Tablet, Oral) Active. TraZODone HCl  (50MG  Tablet, Oral) Active. Aspirin  (81MG  Tablet, Oral) Active. Multi-Vitamin  (Oral) Active. Medications Reconciled   Social History ([name], RMA; 09/10/2017 10:40 AM) Caffeine use   Carbonated beverages, Coffee, Tea. No alcohol use   No drug use   Tobacco use   Never smoker.  Family History ([name], [place]; 09/10/2017 10:40 AM) Heart Disease   Father. Respiratory Condition   Father.  Other Problems ([name], [place]; 09/10/2017 10:40 AM) Arthritis   Asthma   Cancer   Diabetes Mellitus   Enlarged Prostate   Heart murmur   Hemorrhoids   Hypercholesterolemia      Review of Systems [name]; 09/10/2017 11:20 AM) General Not Present- Appetite Loss, Chills, Fatigue, Fever, Night Sweats, Weight Gain and Weight Loss. Skin Not Present- Change  in Wart/Mole, Dryness, Hives, Jaundice, New Lesions, Non-Healing Wounds, Rash and Ulcer. HEENT Present- Hearing Loss, Seasonal Allergies and Wears glasses/contact lenses. Not Present- Earache, Hoarseness, Nose Bleed, Oral Ulcers, Ringing in the Ears, Sinus Pain, Sore Throat, Visual Disturbances and Yellow Eyes. Respiratory Not Present- Bloody sputum, Chronic Cough, Difficulty Breathing, Snoring and Wheezing. Breast Not Present- Breast Mass, Breast Pain,  Nipple Discharge and Skin Changes. Cardiovascular Present- Swelling of Extremities. Not Present- Chest Pain, Difficulty Breathing Lying Down, Leg Cramps, Palpitations, Rapid Heart Rate and Shortness of Breath. Gastrointestinal Present- Hemorrhoids. Not Present- Abdominal Pain, Bloating, Bloody Stool, Change in Bowel Habits, Chronic diarrhea, Constipation, Difficulty Swallowing, Excessive gas, Gets full quickly at meals, Indigestion, Nausea, Rectal Pain and Vomiting. Male Genitourinary Not Present- Blood in Urine, Change in Urinary Stream, Frequency, Impotence, Nocturia, Painful Urination, Urgency and Urine Leakage. Musculoskeletal Not Present- Back Pain and Backache. Psychiatric Not Present- Anxiety and Depression. Hematology Not Present- Abnormal Bleeding and Blood Clots.  Vitals (Tyler A. Brown RMA; 09/10/2017 10:41 AM) 09/10/2017 10:40 AM Weight: 123.2 lb   Height: 60 in  Body Surface Area: 1.52 m   Body Mass Index: 24.06 kg/m   Temp.: 98.4 F    Pulse: 68 (Regular)    BP: 118/74 (Sitting, Left Arm, Standard)       Physical Exam Tyler Gave M. [name] Skinner; 09/10/2017 11:24 AM) The physical exam findings are as follows: Note: Constitutional: No acute distress; conversant; no deformities Eyes: Moist conjunctiva; no lid lag; anicteric sclerae; pupils equal round and reactive to light Neck: Trachea midline; no palpable thyromegaly Lungs: Normal respiratory effort; no tactile fremitus CV: Regular rate and rhythm; no palpable thrill; no pitting edema GI: Abdomen soft, nontender, nondistended; no palpable hepatosplenomegaly Anorectal: Left posterior lateral anal canal lesion with a polypoid appearance, non-pedunculated, glandular appearance to this with what appear to be villi.  DRE-no palpable masses in the distal rectum Anoscopy: Left posterior lateral lesion again seen.  There is a hemi-circumferential area with a glandular-like appearance in the anal canal.  This could be compatible with  AIN or underlying malignancy. MSK: Normal gait; no clubbing/cyanosis Psychiatric: Appropriate affect; alert and oriented 3 Lymphatic: No palpable inguinal or axillary lymphadenopathy    Assessment & Plan Tyler Gave M. Moataz Tavis Skinner; 09/10/2017 11:26 AM) ANAL LESION (K62.9) Impression: Mr. Nix is a very pleasant 30yoM referred to me for evaluation of an anal canal polypoid lesion -Will schedule for anal exam under anesthesia; biopsy; all other indicated procedures - needs cardiac clearance. Only blood thinner is 81mg  ASA which he can continue. -The anatomy and physiology of the GI tract was discussed at length with the patient. The pathophysiology of anal canal lesions and possibility that this is a malignancy was discussed at length with associated pictures and illustrations. -The planned procedure, material risks (including, but not limited to, pain, bleeding, infection, scarring, need for blood transfusion, damage to anal sphincter, worsening incontinence of gas and/or stool, need for additional procedures, recurrence, pneumonia, heart attack, stroke, death) benefits and alternatives to surgery were discussed at length. His questions were answered to his satisfaction and he elected to proceed with surgery Current Plans ANOSCOPY, DIAGNOSTIC (68127) (Anoscopy: Left posterior lateral lesion again seen. There is a hemi-circumferential area with a glandular-like appearance in the anal canal. This could be compatible with AIN or underlying malignancy.) Signed electronically by Ileana Roup, Skinner (09/10/2017 11:27 AM)

## 2017-09-11 ENCOUNTER — Telehealth: Payer: Self-pay

## 2017-09-11 NOTE — Telephone Encounter (Signed)
Faxed via EPIC to requesting provider 

## 2017-09-11 NOTE — Telephone Encounter (Signed)
   [facility] Pre-operative Risk Assessment    Request for surgical clearance:  1. What type of surgery is being performed? Anal exam under anesthesia and biopsy of anal lesion  2. When is this surgery scheduled? Not scheduled yet, awaiting cardiac clearance  3. Are there any medications that need to be held prior to surgery and how long? ASA  4. Practice name and name of physician performing surgery? [facility]-  Dr. Nadeen Landau   5. What is your office phone and fax number? AttnAlean Rinne, RMA Phone [phone] Fax [phone]   6. Anesthesia type (None, local, MAC, general) ? General anesthesia   [place] 09/11/2017, 12:18 PM  _________________________________________________________________   (provider comments below)

## 2017-09-11 NOTE — Telephone Encounter (Signed)
   Primary Cardiologist: No primary care provider on file.  Chart reviewed as part of pre-operative protocol coverage. Given past medical history and time since last visit, based on ACC/AHA guidelines, [name] would be at acceptable risk for the planned procedure without further cardiovascular testing.   He will need antibiotic SBE prophylaxis with amoxicillin 2 g p.o. 1 hour prior to surgical procedure.  This will be ordered by our practice for him to take prior to coming in for procedure.  I will route this recommendation to the requesting party via Epic fax function and remove from pre-op pool.  Please call with questions.  Jory Sims DNP 09/11/2017, 4:27 PM

## 2017-09-12 ENCOUNTER — Encounter (HOSPITAL_COMMUNITY)
Admission: RE | Admit: 2017-09-12 | Discharge: 2017-09-12 | Disposition: A | Discharge status: Home / Self Care | Payer: Self-pay | Source: Ambulatory Visit | Attending: Internal Medicine | Admitting: Internal Medicine

## 2017-09-14 ENCOUNTER — Encounter (HOSPITAL_COMMUNITY)
Admission: RE | Admit: 2017-09-14 | Discharge: 2017-09-14 | Disposition: A | Discharge status: Home / Self Care | Payer: Self-pay | Source: Ambulatory Visit | Attending: Internal Medicine | Admitting: Internal Medicine

## 2017-09-17 ENCOUNTER — Encounter (HOSPITAL_COMMUNITY)
Admission: RE | Admit: 2017-09-17 | Discharge: 2017-09-17 | Disposition: A | Discharge status: Home / Self Care | Payer: Self-pay | Source: Ambulatory Visit | Attending: Internal Medicine | Admitting: Internal Medicine

## 2017-09-19 ENCOUNTER — Encounter (HOSPITAL_COMMUNITY)
Admission: RE | Admit: 2017-09-19 | Discharge: 2017-09-19 | Disposition: A | Discharge status: Home / Self Care | Payer: Self-pay | Source: Ambulatory Visit | Attending: Internal Medicine | Admitting: Internal Medicine

## 2017-09-20 ENCOUNTER — Telehealth: Payer: Self-pay | Admitting: Cardiology

## 2017-09-20 ENCOUNTER — Ambulatory Visit (INDEPENDENT_AMBULATORY_CARE_PROVIDER_SITE_OTHER): Payer: Medicare Other | Admitting: *Deleted

## 2017-09-20 DIAGNOSIS — I442 Atrioventricular block, complete: Secondary | ICD-10-CM | POA: Diagnosis not present

## 2017-09-20 NOTE — Telephone Encounter (Signed)
Spoke with pt and reminded pt of remote transmission that is due today. Pt verbalized understanding.   

## 2017-09-20 NOTE — Progress Notes (Signed)
Remote pacemaker transmission.   

## 2017-09-21 ENCOUNTER — Encounter: Payer: Self-pay | Admitting: Cardiology

## 2017-09-21 ENCOUNTER — Encounter (HOSPITAL_COMMUNITY)
Admission: RE | Admit: 2017-09-21 | Discharge: 2017-09-21 | Disposition: A | Discharge status: Home / Self Care | Payer: Self-pay | Source: Ambulatory Visit | Attending: Internal Medicine | Admitting: Internal Medicine

## 2017-09-24 ENCOUNTER — Encounter (HOSPITAL_COMMUNITY)
Admission: RE | Admit: 2017-09-24 | Discharge: 2017-09-24 | Disposition: A | Discharge status: Home / Self Care | Payer: Self-pay | Source: Ambulatory Visit | Attending: Internal Medicine | Admitting: Internal Medicine

## 2017-09-26 ENCOUNTER — Encounter (HOSPITAL_COMMUNITY)
Admission: RE | Admit: 2017-09-26 | Discharge: 2017-09-26 | Disposition: A | Discharge status: Home / Self Care | Payer: Self-pay | Source: Ambulatory Visit | Attending: Internal Medicine | Admitting: Internal Medicine

## 2017-09-27 DIAGNOSIS — H04123 Dry eye syndrome of bilateral lacrimal glands: Secondary | ICD-10-CM | POA: Diagnosis not present

## 2017-09-27 DIAGNOSIS — E119 Type 2 diabetes mellitus without complications: Secondary | ICD-10-CM | POA: Diagnosis not present

## 2017-09-27 DIAGNOSIS — H2513 Age-related nuclear cataract, bilateral: Secondary | ICD-10-CM | POA: Diagnosis not present

## 2017-09-28 ENCOUNTER — Encounter (HOSPITAL_COMMUNITY)
Admission: RE | Admit: 2017-09-28 | Discharge: 2017-09-28 | Disposition: A | Discharge status: Home / Self Care | Payer: Self-pay | Source: Ambulatory Visit | Attending: Internal Medicine | Admitting: Internal Medicine

## 2017-09-28 DIAGNOSIS — Z950 Presence of cardiac pacemaker: Secondary | ICD-10-CM | POA: Insufficient documentation

## 2017-10-01 ENCOUNTER — Encounter (HOSPITAL_COMMUNITY): Payer: Self-pay

## 2017-10-03 ENCOUNTER — Encounter (HOSPITAL_COMMUNITY)
Admission: RE | Admit: 2017-10-03 | Discharge: 2017-10-03 | Disposition: A | Discharge status: Home / Self Care | Payer: Self-pay | Source: Ambulatory Visit | Attending: Internal Medicine | Admitting: Internal Medicine

## 2017-10-03 NOTE — Progress Notes (Addendum)
09-11-17 (Epic) Cardiac Clearance from [facility]  06-21-17 [facility]) EKG  05-25-16 (Epic ) ECHO

## 2017-10-03 NOTE — Patient Instructions (Addendum)
[name]  10/03/2017   Your procedure is scheduled on: 10-08-17   Report to [facility] Main  Entrance Follow signs to Short Stay on first floor at 530 AM    Call this number if you have problems the morning of surgery [phone]   Remember: Do not eat food or drink liquids :After Midnight.     Take these medicines the morning of surgery with A SIP OF WATER: Buspirone (Buspar), Sertraline (Zoloft), and Desloratadine (Clarinex). You may also bring and use your inhaler and eyedrops as needed.                                You may not have any metal on your body including hair pins and              piercings  Do not wear jewelry, lotions, powders or deodorant             Men may shave face and neck.   Do not bring valuables to the hospital. [place].  Contacts, dentures or bridgework may not be worn into surgery.      Patients discharged the day of surgery will not be allowed to drive home.  Name and phone number of your driver:Sandra [phone]                Please read over the following fact sheets you were given: _____________________________________________________________________          [facility] [address] - Preparing for Surgery Before surgery, you can play an important role.  Because skin is not sterile, your skin needs to be as free of germs as possible.  You can reduce the number of germs on your skin by washing with CHG (chlorahexidine gluconate) soap before surgery.  CHG is an antiseptic cleaner which kills germs and bonds with the skin to continue killing germs even after washing. Please DO NOT use if you have an allergy to CHG or antibacterial soaps.  If your skin becomes reddened/irritated stop using the CHG and inform your nurse when you arrive at Short Stay. Do not shave (including legs and underarms) for at least 48 hours prior to the first CHG shower.  You may shave your face/neck. Please  follow these instructions carefully:  1.  Shower with CHG Soap the night before surgery and the  morning of Surgery.  2.  If you choose to wash your hair, wash your hair first as usual with your  normal  shampoo.  3.  After you shampoo, rinse your hair and body thoroughly to remove the  shampoo.                           4.  Use CHG as you would any other liquid soap.  You can apply chg directly  to the skin and wash                       Gently with a scrungie or clean washcloth.  5.  Apply the CHG Soap to your body ONLY FROM THE NECK DOWN.   Do not use on face/ open  Wound or open sores. Avoid contact with eyes, ears mouth and genitals (private parts).                       Wash face,  Genitals (private parts) with your normal soap.             6.  Wash thoroughly, paying special attention to the area where your surgery  will be performed.  7.  Thoroughly rinse your body with warm water from the neck down.  8.  DO NOT shower/wash with your normal soap after using and rinsing off  the CHG Soap.                9.  Pat yourself dry with a clean towel.            10.  Wear clean pajamas.            11.  Place clean sheets on your bed the night of your first shower and do not  sleep with pets. Day of Surgery : Do not apply any lotions/deodorants the morning of surgery.  Please wear clean clothes to the hospital/surgery center.  FAILURE TO FOLLOW THESE INSTRUCTIONS MAY RESULT IN THE CANCELLATION OF YOUR SURGERY PATIENT SIGNATURE_________________________________  NURSE SIGNATURE__________________________________  ________________________________________________________________________

## 2017-10-04 ENCOUNTER — Other Ambulatory Visit: Payer: Self-pay

## 2017-10-04 ENCOUNTER — Encounter (HOSPITAL_COMMUNITY)
Admission: RE | Admit: 2017-10-04 | Discharge: 2017-10-04 | Disposition: A | Discharge status: Home / Self Care | Payer: Medicare Other | Source: Ambulatory Visit | Attending: Surgery | Admitting: Surgery

## 2017-10-04 ENCOUNTER — Encounter (HOSPITAL_COMMUNITY): Payer: Self-pay

## 2017-10-04 DIAGNOSIS — Z01812 Encounter for preprocedural laboratory examination: Secondary | ICD-10-CM | POA: Diagnosis not present

## 2017-10-04 DIAGNOSIS — K6289 Other specified diseases of anus and rectum: Secondary | ICD-10-CM | POA: Diagnosis not present

## 2017-10-04 DIAGNOSIS — Z01810 Encounter for preprocedural cardiovascular examination: Secondary | ICD-10-CM | POA: Diagnosis not present

## 2017-10-04 DIAGNOSIS — R9431 Abnormal electrocardiogram [ECG] [EKG]: Secondary | ICD-10-CM | POA: Insufficient documentation

## 2017-10-04 LAB — CBC WITH DIFFERENTIAL/PLATELET
BASOS ABS: 0 10*3/uL (ref 0.0–0.1)
BASOS PCT: 1 %
EOS ABS: 0.1 10*3/uL (ref 0.0–0.7)
Eosinophils Relative: 1 %
HCT: 37 % — ABNORMAL LOW (ref 39.0–52.0)
HEMOGLOBIN: 12.7 g/dL — AB (ref 13.0–17.0)
LYMPHS ABS: 1.4 10*3/uL (ref 0.7–4.0)
Lymphocytes Relative: 32 %
MCH: 33.5 pg (ref 26.0–34.0)
MCHC: 34.3 g/dL (ref 30.0–36.0)
MCV: 97.6 fL (ref 78.0–100.0)
Monocytes Absolute: 0.3 10*3/uL (ref 0.1–1.0)
Monocytes Relative: 7 %
NEUTROS PCT: 59 %
Neutro Abs: 2.5 10*3/uL (ref 1.7–7.7)
Platelets: 199 10*3/uL (ref 150–400)
RBC: 3.79 MIL/uL — AB (ref 4.22–5.81)
RDW: 12.4 % (ref 11.5–15.5)
WBC: 4.3 10*3/uL (ref 4.0–10.5)

## 2017-10-04 LAB — HEMOGLOBIN A1C
Hgb A1c MFr Bld: 6.1 % — ABNORMAL HIGH (ref 4.8–5.6)
Mean Plasma Glucose: 128.37 mg/dL

## 2017-10-04 LAB — COMPREHENSIVE METABOLIC PANEL
ALBUMIN: 3.4 g/dL — AB (ref 3.5–5.0)
ALK PHOS: 51 U/L (ref 38–126)
ALT: 21 U/L (ref 17–63)
AST: 33 U/L (ref 15–41)
Anion gap: 4 — ABNORMAL LOW (ref 5–15)
BUN: 15 mg/dL (ref 6–20)
CALCIUM: 8.8 mg/dL — AB (ref 8.9–10.3)
CHLORIDE: 105 mmol/L (ref 101–111)
CO2: 28 mmol/L (ref 22–32)
CREATININE: 0.94 mg/dL (ref 0.61–1.24)
GFR calc Af Amer: >60 mL/min (ref >60)
GFR calc non Af Amer: >60 mL/min (ref >60)
GLUCOSE: 103 mg/dL — AB (ref 65–99)
Potassium: 4.3 mmol/L (ref 3.5–5.1)
SODIUM: 137 mmol/L (ref 135–145)
Total Bilirubin: 0.4 mg/dL (ref 0.3–1.2)
Total Protein: 5.7 g/dL — ABNORMAL LOW (ref 6.5–8.1)

## 2017-10-05 ENCOUNTER — Encounter (HOSPITAL_COMMUNITY)
Admission: RE | Admit: 2017-10-05 | Discharge: 2017-10-05 | Disposition: A | Discharge status: Home / Self Care | Payer: Self-pay | Source: Ambulatory Visit | Attending: Internal Medicine | Admitting: Internal Medicine

## 2017-10-08 ENCOUNTER — Encounter (HOSPITAL_COMMUNITY): Payer: Self-pay

## 2017-10-08 ENCOUNTER — Other Ambulatory Visit: Payer: Self-pay

## 2017-10-08 ENCOUNTER — Ambulatory Visit (HOSPITAL_COMMUNITY)
Admission: RE | Admit: 2017-10-08 | Discharge: 2017-10-08 | Disposition: A | Discharge status: Home / Self Care | Payer: Medicare Other | Source: Ambulatory Visit | Attending: Surgery | Admitting: Surgery

## 2017-10-08 ENCOUNTER — Encounter (HOSPITAL_COMMUNITY)
Admission: RE | Disposition: A | Discharge status: Home / Self Care | Payer: Self-pay | Source: Ambulatory Visit | Attending: Surgery

## 2017-10-08 ENCOUNTER — Ambulatory Visit (HOSPITAL_COMMUNITY): Payer: Medicare Other | Admitting: Anesthesiology

## 2017-10-08 ENCOUNTER — Encounter (HOSPITAL_COMMUNITY): Payer: Self-pay | Admitting: Emergency Medicine

## 2017-10-08 DIAGNOSIS — Z9079 Acquired absence of other genital organ(s): Secondary | ICD-10-CM | POA: Insufficient documentation

## 2017-10-08 DIAGNOSIS — Z950 Presence of cardiac pacemaker: Secondary | ICD-10-CM | POA: Diagnosis not present

## 2017-10-08 DIAGNOSIS — Z923 Personal history of irradiation: Secondary | ICD-10-CM | POA: Insufficient documentation

## 2017-10-08 DIAGNOSIS — Z7982 Long term (current) use of aspirin: Secondary | ICD-10-CM | POA: Diagnosis not present

## 2017-10-08 DIAGNOSIS — E119 Type 2 diabetes mellitus without complications: Secondary | ICD-10-CM | POA: Diagnosis not present

## 2017-10-08 DIAGNOSIS — E785 Hyperlipidemia, unspecified: Secondary | ICD-10-CM | POA: Insufficient documentation

## 2017-10-08 DIAGNOSIS — K620 Anal polyp: Secondary | ICD-10-CM | POA: Insufficient documentation

## 2017-10-08 DIAGNOSIS — K629 Disease of anus and rectum, unspecified: Secondary | ICD-10-CM | POA: Diagnosis present

## 2017-10-08 DIAGNOSIS — F329 Major depressive disorder, single episode, unspecified: Secondary | ICD-10-CM | POA: Diagnosis not present

## 2017-10-08 DIAGNOSIS — K6289 Other specified diseases of anus and rectum: Secondary | ICD-10-CM | POA: Diagnosis not present

## 2017-10-08 DIAGNOSIS — Z86010 Personal history of colonic polyps: Secondary | ICD-10-CM | POA: Insufficient documentation

## 2017-10-08 DIAGNOSIS — Z7951 Long term (current) use of inhaled steroids: Secondary | ICD-10-CM | POA: Diagnosis not present

## 2017-10-08 DIAGNOSIS — J45909 Unspecified asthma, uncomplicated: Secondary | ICD-10-CM | POA: Insufficient documentation

## 2017-10-08 DIAGNOSIS — E7800 Pure hypercholesterolemia, unspecified: Secondary | ICD-10-CM | POA: Diagnosis not present

## 2017-10-08 DIAGNOSIS — Z79899 Other long term (current) drug therapy: Secondary | ICD-10-CM | POA: Diagnosis not present

## 2017-10-08 DIAGNOSIS — Z8547 Personal history of malignant neoplasm of testis: Secondary | ICD-10-CM | POA: Diagnosis not present

## 2017-10-08 HISTORY — PX: RECTAL BIOPSY: SHX2303

## 2017-10-08 LAB — GLUCOSE, CAPILLARY
GLUCOSE-CAPILLARY: 105 mg/dL — AB (ref 65–99)
Glucose-Capillary: 91 mg/dL (ref 65–99)

## 2017-10-08 SURGERY — EXAM UNDER ANESTHESIA
Anesthesia: Monitor Anesthesia Care | Site: Rectum

## 2017-10-08 MED ORDER — PROPOFOL 500 MG/50ML IV EMUL
INTRAVENOUS | Status: DC | PRN
  Administered 2017-10-08: 100 ug/kg/min via INTRAVENOUS

## 2017-10-08 MED ORDER — BUPIVACAINE-EPINEPHRINE 0.25% -1:200000 IJ SOLN
INTRAMUSCULAR | Status: DC | PRN
  Administered 2017-10-08: 30 mL

## 2017-10-08 MED ORDER — BUPIVACAINE LIPOSOME 1.3 % IJ SUSP
20.0000 mL | Freq: Once | INTRAMUSCULAR | Status: AC
  Administered 2017-10-08: 20 mL
  Filled 2017-10-08: qty 20

## 2017-10-08 MED ORDER — FLEET ENEMA 7-19 GM/118ML RE ENEM
1.0000 | ENEMA | Freq: Once | RECTAL | Status: DC
  Filled 2017-10-08: qty 1

## 2017-10-08 MED ORDER — PROPOFOL 10 MG/ML IV BOLUS
INTRAVENOUS | Status: AC
  Filled 2017-10-08: qty 40

## 2017-10-08 MED ORDER — FENTANYL CITRATE (PF) 100 MCG/2ML IJ SOLN: 25.0000 ug | INTRAMUSCULAR | Status: DC | PRN

## 2017-10-08 MED ORDER — CHLORHEXIDINE GLUCONATE CLOTH 2 % EX PADS: 6.0000 | MEDICATED_PAD | Freq: Once | CUTANEOUS | Status: DC

## 2017-10-08 MED ORDER — GABAPENTIN 300 MG PO CAPS
300.0000 mg | ORAL_CAPSULE | ORAL | Status: AC
  Administered 2017-10-08: 300 mg via ORAL
  Filled 2017-10-08: qty 1

## 2017-10-08 MED ORDER — 0.9 % SODIUM CHLORIDE (POUR BTL) OPTIME
TOPICAL | Status: DC | PRN
  Administered 2017-10-08: 1000 mL

## 2017-10-08 MED ORDER — ACETAMINOPHEN 500 MG PO TABS
1000.0000 mg | ORAL_TABLET | ORAL | Status: AC
  Administered 2017-10-08: 1000 mg via ORAL
  Filled 2017-10-08: qty 2

## 2017-10-08 MED ORDER — LACTATED RINGERS IV SOLN
INTRAVENOUS | Status: DC | PRN
  Administered 2017-10-08: 07:00:00 via INTRAVENOUS

## 2017-10-08 MED ORDER — TRAMADOL HCL 50 MG PO TABS: 50.0000 mg | ORAL_TABLET | Freq: Four times a day (QID) | ORAL | 0 refills | Status: AC | PRN

## 2017-10-08 MED ORDER — PROMETHAZINE HCL 25 MG/ML IJ SOLN: 6.2500 mg | INTRAMUSCULAR | Status: DC | PRN

## 2017-10-08 MED ORDER — BUPIVACAINE-EPINEPHRINE 0.25% -1:200000 IJ SOLN
INTRAMUSCULAR | Status: AC
Start: 2017-10-08 — End: ?
  Filled 2017-10-08: qty 1

## 2017-10-08 SURGICAL SUPPLY — 46 items
APL SKNCLS STERI-STRIP NONHPOA (GAUZE/BANDAGES/DRESSINGS) ×4
BENZOIN TINCTURE PRP APPL 2/3 (GAUZE/BANDAGES/DRESSINGS) ×6 IMPLANT
BLADE EXTENDED COATED 6.5IN (ELECTRODE) IMPLANT
BLADE HEX COATED 2.75 (ELECTRODE) ×3 IMPLANT
BLADE SURG 15 STRL LF DISP TIS (BLADE) IMPLANT
BLADE SURG 15 STRL SS (BLADE)
BRIEF STRETCH FOR OB PAD LRG (UNDERPADS AND DIAPERS) ×6 IMPLANT
COVER MAYO STAND STRL (DRAPES) ×3 IMPLANT
DECANTER SPIKE VIAL GLASS SM (MISCELLANEOUS) ×3 IMPLANT
DRAPE LAPAROTOMY 100X72 PEDS (DRAPES) ×3 IMPLANT
DRAPE UTILITY XL STRL (DRAPES) ×3 IMPLANT
ELECT REM PT RETURN 9FT ADLT (ELECTROSURGICAL) ×3
ELECTRODE REM PT RTRN 9FT ADLT (ELECTROSURGICAL) ×2 IMPLANT
GAUZE SPONGE 4X4 12PLY STRL (GAUZE/BANDAGES/DRESSINGS) ×3 IMPLANT
GAUZE SPONGE 4X4 16PLY XRAY LF (GAUZE/BANDAGES/DRESSINGS) ×3 IMPLANT
GLOVE BIO SURGEON STRL SZ7.5 (GLOVE) ×3 IMPLANT
GLOVE INDICATOR 8.0 STRL GRN (GLOVE) ×3 IMPLANT
GOWN STRL REUS W/TWL LRG LVL3 (GOWN DISPOSABLE) ×3 IMPLANT
KIT BASIN OR (CUSTOM PROCEDURE TRAY) ×2 IMPLANT
NDL SAFETY ECLIPSE 18X1.5 (NEEDLE) IMPLANT
NEEDLE HYPO 18GX1.5 SHARP (NEEDLE)
NEEDLE HYPO 22GX1.5 SAFETY (NEEDLE) ×3 IMPLANT
PACK GENERAL/GYN (CUSTOM PROCEDURE TRAY) ×2 IMPLANT
PAD ABD 8X10 STRL (GAUZE/BANDAGES/DRESSINGS) ×3 IMPLANT
PENCIL BUTTON HOLSTER BLD 10FT (ELECTRODE) ×3 IMPLANT
SPONGE HEMORRHOID 8X3CM (HEMOSTASIS) ×3 IMPLANT
SPONGE SURGIFOAM ABS GEL 12-7 (HEMOSTASIS) IMPLANT
SUCTION FRAZIER HANDLE 10FR (MISCELLANEOUS)
SUCTION TUBE FRAZIER 10FR DISP (MISCELLANEOUS) IMPLANT
SUT CHROMIC 2 0 SH (SUTURE) IMPLANT
SUT CHROMIC 3 0 SH 27 (SUTURE) IMPLANT
SUT ETHIBOND 0 (SUTURE) IMPLANT
SUT MNCRL AB 4-0 PS2 18 (SUTURE) IMPLANT
SUT SILK 2 0 (SUTURE)
SUT SILK 2-0 18XBRD TIE 12 (SUTURE) IMPLANT
SUT VIC AB 2-0 SH 27 (SUTURE)
SUT VIC AB 2-0 SH 27XBRD (SUTURE) IMPLANT
SUT VIC AB 3-0 SH 18 (SUTURE) ×2 IMPLANT
SUT VIC AB 4-0 P-3 18XBRD (SUTURE) IMPLANT
SUT VIC AB 4-0 P3 18 (SUTURE)
SYR CONTROL 10ML LL (SYRINGE) ×3 IMPLANT
TAPE CLOTH SURG 4X10 WHT LF (GAUZE/BANDAGES/DRESSINGS) ×3 IMPLANT
TOWEL OR 17X24 6PK STRL BLUE (TOWEL DISPOSABLE) ×3 IMPLANT
TOWEL OR 17X26 10 PK STRL BLUE (TOWEL DISPOSABLE) ×3 IMPLANT
TUBE CONNECTING 12X1/4 (SUCTIONS) ×3 IMPLANT
YANKAUER SUCT BULB TIP NO VENT (SUCTIONS) ×3 IMPLANT

## 2017-10-08 NOTE — Anesthesia Postprocedure Evaluation (Signed)
Anesthesia Post Note  Patient: [name]  Procedure(s) Performed: ANAL EXAM UNDER ANESTHESIA (N/A ) EXCISION OF ANAL POLYP  (N/A Rectum)     Patient location during evaluation: PACU Anesthesia Type: MAC Level of consciousness: awake and alert Pain management: pain level controlled Vital Signs Assessment: post-procedure vital signs reviewed and stable Respiratory status: spontaneous breathing, nonlabored ventilation, respiratory function stable and patient connected to nasal cannula oxygen Cardiovascular status: stable and blood pressure returned to baseline Postop Assessment: no apparent nausea or vomiting Anesthetic complications: no    Last Vitals:  Vitals:   10/08/17 0845 10/08/17 0900  BP: (!) 141/68 (!) 150/67  Pulse: 62 62  Resp: 12 13  Temp:    SpO2: 100% 100%    Last Pain:  Vitals:   10/08/17 0845  TempSrc:   PainSc: 0-No pain                 [name]

## 2017-10-08 NOTE — H&P (Signed)
H&P Update  The H&P from 09/10/17 was reviewed by myself with the patient and he reports no interval changes.  Vitals:   10/08/17 0606 10/08/17 0618  BP:  (!) 128/58  Pulse:  65  Resp:  16  Temp:  97.8 F (36.6 C)  TempSrc:  Oral  SpO2:  98%  Weight: 56.2 kg (124 lb) 54.4 kg (120 lb)  Height: 5\' 3"  (1.6 m) 5\' 3"  (1.6 m)   GEN: NAD, comfortable CV: RRR  PLAN -The anatomy and physiology of the GI tract was discussed at length with the patient. The pathophysiology of anal canal lesions and possibility that this is a malignancy was discussed at length with associated pictures and illustrations. -The planned procedure, material risks (including, but not limited to, pain, bleeding, infection, scarring, need for blood transfusion, damage to anal sphincter, worsening incontinence of gas and/or stool, need for additional procedures, recurrence, pneumonia, heart attack, stroke, death) benefits and alternatives to surgery were discussed at length. His questions were answered to his satisfaction and he elected to proceed with surgery  Sharon Mt. Dema Severin, M.D. General and Colorectal Surgery [facility]

## 2017-10-08 NOTE — Op Note (Signed)
10/08/2017  8:12 AM  PATIENT:  [name]  77 y.o. male  Patient Care Team: Jonathon Jordan, MD as PCP - General (Family Medicine) Alessandra Grout, MD (Inactive) as Referring Physician (Family Medicine) Bensimhon, Shaune Pascal, MD as Attending Physician (Cardiology) Leslie Andrea, MD as Referring Physician (Internal Medicine)  PRE-OPERATIVE DIAGNOSIS:  Anal canal lesion  POST-OPERATIVE DIAGNOSIS:  Anal canal lesion  PROCEDURE:   1. Anal exam under anesthesia 2. Excision of anal canal polyp  SURGEON:  Surgeon(s): Ileana Roup, MD  ASSISTANT: none   ANESTHESIA:   MAC  SPECIMEN: Left posteriolateral anal canal polypoid lesion  DISPOSITION OF SPECIMEN:  PATHOLOGY  COUNTS: Sponge, needle, instrument counts reported correct x2 at conclusion of the procedure  PLAN OF CARE: Discharge to home after PACU  PATIENT DISPOSITION:  PACU - hemodynamically stable.   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 This has grown in size dramatically he says in the last 1-1.87yr. he notices a prolapsing piece of tissue from the anal canal with bowel movements that occasionally bleeds particularly when his stool is firmer. He denies any anal canal pain. He denies history of receptive anal intercourse. He denies history of being treated for HPV in the past. He is not taking a daily fiber supplement. He does have issues with fecal leakage particularly stool and liquid. He also has problems with incontinence if he can't immediately get to bathroom when the urge hits him. He denies history of anorectal procedures. 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 He reports having had this found 5 years ago but that it has been stable in size and his only treatment as observation at this time.  He states his last colonoscopy was approximately 5  years ago with Dr. EOletta Lamasand he believes there is no pertinent findings -although this report is not an Epic at this time. He does report a personal history of colon polyps in the past on prior colonoscopies. He was recently seen by Dr. EOletta Lamasand referred to me for this anal canal lesion. He states that Dr. EOletta Lamasis planning to perform a FIT test those having to defer colonoscopy at this time.  Physical exam demonstrated a polypoid lesion in the left posterior lateral region which is not pedunculated and had a glandular appearance with what appeared to the villous vs condylomatous changes on its surface.  The anatomy and physiology of the GI tract was discussed at length with the patient. The pathophysiology of anal canal lesions and possibility that this is a malignancy was discussed at length with associated pictures and illustrations. The planned procedure, material risks (including, but not limited to, pain, bleeding, infection, scarring, need for blood transfusion, damage to anal sphincter, worsening incontinence of gas and/or stool, need for additional procedures, recurrence, pneumonia, heart attack, stroke, death) benefits and alternatives to surgery were discussed at length. His questions were answered to his satisfaction and he elected to proceed with surgery  OR FINDINGS: Left posterolateral anal canal polypoid vs condylomatous lesion - excised fully and defect closed. A couple other 1-244mfoci of [name]/plaque which may represent flat condyloma were fulgurated.  DESCRIPTION: The patient was identified in the preoperative holding area and taken to the OR where they were placed on the operating room table. SCDs were placed.  MAC was induced without difficulty. The patient was then  positioned in prone jackknife position with buttocks gently taped apart.  The patient was then prepped and draped in usual sterile fashion.  A surgical timeout was performed indicating the correct patient,  procedure, positioning and need for preoperative antibiotics.  A rectal block was performed using Marcaine with epinephrine+Exparel.    Digital rectal exam with a well lubricated finger demonstrated no palpable masses in the rectum. Circumferential anoscopy with a Charlann Lange demonstrated a polypoid-like lesion arising on what appeared to be a hemorrhoid in the left, posterior lateral position of the anal canal.  The borders of this were marked and this was excised down to the level of sphincter muscle.  Care was taken not to injure or divide any sphincter muscle during this procedure.  The lesion was fully excised and passed off the specimen.  Hemostasis was verified.  The defect was then closed using a running locking 3-0 Vicryl suture.  Additional surrounding foci of flat possibly condylomatous changes were fulgurated. The anal canal was then irrigated. Hemostasis was again confirmed.  A piece of Proctofoam was placed in the anal canal.  4 x 4 gauze and ABD pad were then used to cover the anal canal.  The patient was then transferred back to the supine position on a stretcher and was transferred to PACU in satisfactory condition

## 2017-10-08 NOTE — Discharge Instructions (Signed)
ANORECTAL SURGERY: POST OP INSTRUCTIONS  1. DIET: Follow a light bland diet the first 24 hours after arrival home, such as soup, liquids, crackers, etc.  Be sure to include lots of fluids daily.  Avoid fast food or heavy meals as your are more likely to get nauseated.  Eat a low fat diet the next few days after surgery.    2. Take your usually prescribed home medications unless otherwise directed.  3. PAIN CONTROL: a. It is helpful to take an over-the-counter pain medication regularly for the first few days/weeks.  Choose from the following that works best for you: i. Ibuprofen (Advil, etc) Three 200mg  tabs every 6 hours as needed. ii. Acetaminophen (Tylenol, etc) 500-650mg  every 6 hours as needed iii. NOTE: You may take both of these medications together - most patients find it most helpful when alternating between the two (i.e. Ibuprofen at 6am, tylenol at 9am, ibuprofen at 12pm ...) b. A  prescription for pain medication may have been prescribed for you at discharge.  Take your pain medication as prescribed.  i. If you are having problems/concerns with the prescription medicine, please call us for further advice.  4. Avoid getting constipated.  Between the surgery and the pain medications, it is common to experience some constipation.  Increasing fluid intake (64oz of water per day) and taking a fiber supplement (such as Metamucil, Citrucel, FiberCon) 1-2 times a day regularly will usually help prevent this problem from occurring.  Take Miralax (over the counter) 1-2x/day while taking a narcotic pain medication. If no bowel movement after 48hours, you may additionally take a laxative like a bottle of Milk of Magnesia which can be purchased over the counter. Avoid enemas if possible as these are often painful.   5. Watch out for diarrhea.  If you have many loose bowel movements, simplify your diet to bland foods.  Stop any stool softeners and decrease your fiber supplement. If this worsens or does  not improve, please call us.  6. Wash / shower every day.  A piece of precut foam was intentionally left in the anal canal. This will pass with (or even before) your first bowel movement. If this comes out on your way home, etc, this is nothing to be concerned about. You may shower normally, getting soap/water on your wound, particularly after bowel movements.  7. Soaking in a warm bath filled a couple inches ("Sitz bath") is a great way to clean the area after a bowel movement and many patients find it is a way to soothe the area.  8. ACTIVITIES as tolerated:   a. You may resume regular (light) daily activities beginning the next day--such as daily self-care, walking, climbing stairs--gradually increasing activities as tolerated.  If you can walk 30 minutes without difficulty, it is safe to try more intense activity such as jogging, treadmill, bicycling, low-impact aerobics, etc. b. Refrain from any heavy lifting or straining for the first 2 weeks after your procedure, particularly if your surgery was for hemorrhoids. c. Avoid activities that make your pain worse d. You may drive when you are no longer taking prescription pain medication, you can comfortably wear a seatbelt, and you can safely maneuver your car and apply brakes.  9. FOLLOW UP in our office a. Please call [facility] at [phone] to set up an appointment to see your surgeon in the office for a follow-up appointment approximately 2 weeks after your surgery. b. Make sure that you call for this appointment the day you  arrive home to insure a convenient appointment time.  9. If you have disability or family leave forms that need to be completed, you may have them completed by your primary care physician's office; for return to work instructions, please ask our office staff and they will be happy to assist you in obtaining this documentation   When to call us [phone]: 1. Poor pain control 2. Reactions / problems with new  medications (rash/itching, etc)  3. Fever over 101.5 F (38.5 C) 4. Inability to urinate 5. Nausea/vomiting 6. Worsening swelling or bruising 7. Continued bleeding from incision. 8. Increased pain, redness, or drainage from the incision  The clinic staff is available to answer your questions during regular business hours (8:30am-5pm).  Please dont hesitate to call and ask to speak to one of our nurses for clinical concerns.   A surgeon from [facility] is always on call at the hospitals   If you have a medical emergency, go to the nearest emergency room or call 911.   [facility], [place], [place], [place], [place]  [address] ? MAIN: [phone] FAX [phone] [email or web address]

## 2017-10-08 NOTE — Anesthesia Preprocedure Evaluation (Signed)
Anesthesia Evaluation  Patient identified by MRN, date of birth, ID band Patient awake    Reviewed: Allergy & Precautions, NPO status , Patient's Chart, lab work & pertinent test results  Airway Mallampati: II  TM Distance: >3 FB Neck ROM: Full    Dental no notable dental hx.    Pulmonary neg pulmonary ROS,    Pulmonary exam normal breath sounds clear to auscultation       Cardiovascular Normal cardiovascular exam+ pacemaker  Rhythm:Regular Rate:Normal     Neuro/Psych negative neurological ROS  negative psych ROS   GI/Hepatic negative GI ROS, Neg liver ROS,   Endo/Other  diabetes  Renal/GU negative Renal ROS  negative genitourinary   Musculoskeletal negative musculoskeletal ROS (+)   Abdominal   Peds negative pediatric ROS (+)  Hematology negative hematology ROS (+)   Anesthesia Other Findings   Reproductive/Obstetrics negative OB ROS                             Anesthesia Physical Anesthesia Plan  ASA: III  Anesthesia Plan: MAC   Post-op Pain Management:    Induction: Intravenous  PONV Risk Score and Plan: 0  Airway Management Planned: Simple Face Mask  Additional Equipment:   Intra-op Plan:   Post-operative Plan:   Informed Consent: I have reviewed the patients History and Physical, chart, labs and discussed the procedure including the risks, benefits and alternatives for the proposed anesthesia with the patient or authorized representative who has indicated his/her understanding and acceptance.   Dental advisory given  Plan Discussed with: CRNA and Surgeon  Anesthesia Plan Comments:         Anesthesia Quick Evaluation

## 2017-10-08 NOTE — Transfer of Care (Signed)
Immediate Anesthesia Transfer of Care Note  Patient: [name]  Procedure(s) Performed: ANAL EXAM UNDER ANESTHESIA (N/A ) EXCISION OF ANAL POLYP  (N/A Rectum)  Patient Location: PACU  Anesthesia Type:MAC  Level of Consciousness: sedated  Airway & Oxygen Therapy: Patient Spontanous Breathing and Patient connected to face mask oxygen  Post-op Assessment: Report given to RN and Post -op Vital signs reviewed and stable  Post vital signs: Reviewed and stable  Last Vitals:  Vitals:   10/08/17 0618  BP: (!) 128/58  Pulse: 65  Resp: 16  Temp: 36.6 C  SpO2: 98%    Last Pain:  Vitals:   10/08/17 0618  TempSrc: Oral      Patients Stated Pain Goal: 4 (24/82/50 0370)  Complications: No apparent anesthesia complications

## 2017-10-09 ENCOUNTER — Encounter (HOSPITAL_COMMUNITY): Payer: Self-pay | Admitting: Surgery

## 2017-10-10 ENCOUNTER — Encounter (HOSPITAL_COMMUNITY): Payer: Self-pay

## 2017-10-10 LAB — CUP PACEART REMOTE DEVICE CHECK
Battery Remaining Longevity: 119 mo
Battery Voltage: 2.79 V
Brady Statistic AS VP Percent: 81 %
Date Time Interrogation Session: 20190124173559
Implantable Lead Implant Date: 20140625
Implantable Lead Location: 753859
Implantable Lead Location: 753860
Implantable Lead Model: 5076
Implantable Pulse Generator Implant Date: 20140625
Lead Channel Impedance Value: 474 Ohm
Lead Channel Pacing Threshold Amplitude: 0.5 V
Lead Channel Pacing Threshold Pulse Width: 0.4 ms
Lead Channel Setting Pacing Amplitude: 1.5 V
Lead Channel Setting Pacing Pulse Width: 0.4 ms
MDC IDC LEAD IMPLANT DT: 20140625
MDC IDC MSMT BATTERY IMPEDANCE: 251 Ohm
MDC IDC MSMT LEADCHNL RA PACING THRESHOLD PULSEWIDTH: 0.4 ms
MDC IDC MSMT LEADCHNL RV IMPEDANCE VALUE: 485 Ohm
MDC IDC MSMT LEADCHNL RV PACING THRESHOLD AMPLITUDE: 0.5 V
MDC IDC SET LEADCHNL RV PACING AMPLITUDE: 2 V
MDC IDC SET LEADCHNL RV SENSING SENSITIVITY: 2 mV
MDC IDC STAT BRADY AP VP PERCENT: 19 %
MDC IDC STAT BRADY AP VS PERCENT: 0 %
MDC IDC STAT BRADY AS VS PERCENT: 0 %

## 2017-10-12 ENCOUNTER — Encounter (HOSPITAL_COMMUNITY): Payer: Self-pay

## 2017-10-15 ENCOUNTER — Encounter (HOSPITAL_COMMUNITY): Payer: Self-pay

## 2017-10-17 ENCOUNTER — Encounter (HOSPITAL_COMMUNITY): Payer: Self-pay

## 2017-10-19 ENCOUNTER — Encounter (HOSPITAL_COMMUNITY)
Admission: RE | Admit: 2017-10-19 | Discharge: 2017-10-19 | Disposition: A | Discharge status: Home / Self Care | Payer: Medicare Other | Source: Ambulatory Visit | Attending: Internal Medicine | Admitting: Internal Medicine

## 2017-10-22 ENCOUNTER — Encounter (HOSPITAL_COMMUNITY)
Admission: RE | Admit: 2017-10-22 | Discharge: 2017-10-22 | Disposition: A | Discharge status: Home / Self Care | Payer: Self-pay | Source: Ambulatory Visit | Attending: Internal Medicine | Admitting: Internal Medicine

## 2017-10-24 ENCOUNTER — Encounter (HOSPITAL_COMMUNITY)
Admission: RE | Admit: 2017-10-24 | Discharge: 2017-10-24 | Disposition: A | Discharge status: Home / Self Care | Payer: Self-pay | Source: Ambulatory Visit | Attending: Internal Medicine | Admitting: Internal Medicine

## 2017-10-26 ENCOUNTER — Encounter (HOSPITAL_COMMUNITY)
Admission: RE | Admit: 2017-10-26 | Discharge: 2017-10-26 | Disposition: A | Discharge status: Home / Self Care | Payer: Self-pay | Source: Ambulatory Visit | Attending: Internal Medicine | Admitting: Internal Medicine

## 2017-10-26 DIAGNOSIS — Z950 Presence of cardiac pacemaker: Secondary | ICD-10-CM | POA: Insufficient documentation

## 2017-10-29 ENCOUNTER — Encounter (HOSPITAL_COMMUNITY)
Admission: RE | Admit: 2017-10-29 | Discharge: 2017-10-29 | Disposition: A | Discharge status: Home / Self Care | Payer: Self-pay | Source: Ambulatory Visit | Attending: Internal Medicine | Admitting: Internal Medicine

## 2017-10-31 ENCOUNTER — Encounter (HOSPITAL_COMMUNITY)
Admission: RE | Admit: 2017-10-31 | Discharge: 2017-10-31 | Disposition: A | Discharge status: Home / Self Care | Payer: Self-pay | Source: Ambulatory Visit | Attending: Internal Medicine | Admitting: Internal Medicine

## 2017-11-02 ENCOUNTER — Encounter (HOSPITAL_COMMUNITY)
Admission: RE | Admit: 2017-11-02 | Discharge: 2017-11-02 | Disposition: A | Discharge status: Home / Self Care | Payer: Self-pay | Source: Ambulatory Visit | Attending: Internal Medicine | Admitting: Internal Medicine

## 2017-11-05 ENCOUNTER — Encounter (HOSPITAL_COMMUNITY)
Admission: RE | Admit: 2017-11-05 | Discharge: 2017-11-05 | Disposition: A | Discharge status: Home / Self Care | Payer: Self-pay | Source: Ambulatory Visit | Attending: Internal Medicine | Admitting: Internal Medicine

## 2017-11-07 ENCOUNTER — Encounter (HOSPITAL_COMMUNITY)
Admission: RE | Admit: 2017-11-07 | Discharge: 2017-11-07 | Disposition: A | Discharge status: Home / Self Care | Payer: Self-pay | Source: Ambulatory Visit | Attending: Internal Medicine | Admitting: Internal Medicine

## 2017-11-09 ENCOUNTER — Encounter (HOSPITAL_COMMUNITY): Payer: Self-pay

## 2017-11-12 ENCOUNTER — Encounter (HOSPITAL_COMMUNITY): Payer: Self-pay

## 2017-11-13 DIAGNOSIS — C9190 Lymphoid leukemia, unspecified not having achieved remission: Secondary | ICD-10-CM | POA: Diagnosis not present

## 2017-11-13 DIAGNOSIS — Z79899 Other long term (current) drug therapy: Secondary | ICD-10-CM | POA: Diagnosis not present

## 2017-11-13 DIAGNOSIS — I253 Aneurysm of heart: Secondary | ICD-10-CM | POA: Diagnosis not present

## 2017-11-13 DIAGNOSIS — E1122 Type 2 diabetes mellitus with diabetic chronic kidney disease: Secondary | ICD-10-CM | POA: Diagnosis not present

## 2017-11-13 DIAGNOSIS — N200 Calculus of kidney: Secondary | ICD-10-CM | POA: Diagnosis not present

## 2017-11-13 DIAGNOSIS — F325 Major depressive disorder, single episode, in full remission: Secondary | ICD-10-CM | POA: Diagnosis not present

## 2017-11-13 DIAGNOSIS — D692 Other nonthrombocytopenic purpura: Secondary | ICD-10-CM | POA: Diagnosis not present

## 2017-11-13 DIAGNOSIS — Z0000 Encounter for general adult medical examination without abnormal findings: Secondary | ICD-10-CM | POA: Diagnosis not present

## 2017-11-14 ENCOUNTER — Encounter (HOSPITAL_COMMUNITY)
Admission: RE | Admit: 2017-11-14 | Discharge: 2017-11-14 | Disposition: A | Discharge status: Home / Self Care | Payer: Self-pay | Source: Ambulatory Visit | Attending: Internal Medicine | Admitting: Internal Medicine

## 2017-11-16 ENCOUNTER — Encounter (HOSPITAL_COMMUNITY): Payer: Self-pay

## 2017-11-19 ENCOUNTER — Encounter (HOSPITAL_COMMUNITY): Payer: Self-pay

## 2017-11-20 DIAGNOSIS — N400 Enlarged prostate without lower urinary tract symptoms: Secondary | ICD-10-CM | POA: Diagnosis not present

## 2017-11-20 DIAGNOSIS — N182 Chronic kidney disease, stage 2 (mild): Secondary | ICD-10-CM | POA: Diagnosis not present

## 2017-11-20 DIAGNOSIS — K5730 Diverticulosis of large intestine without perforation or abscess without bleeding: Secondary | ICD-10-CM | POA: Diagnosis not present

## 2017-11-20 DIAGNOSIS — Z8547 Personal history of malignant neoplasm of testis: Secondary | ICD-10-CM | POA: Diagnosis not present

## 2017-11-20 DIAGNOSIS — C9110 Chronic lymphocytic leukemia of B-cell type not having achieved remission: Secondary | ICD-10-CM | POA: Diagnosis not present

## 2017-11-20 DIAGNOSIS — R198 Other specified symptoms and signs involving the digestive system and abdomen: Secondary | ICD-10-CM | POA: Diagnosis not present

## 2017-11-20 DIAGNOSIS — Z952 Presence of prosthetic heart valve: Secondary | ICD-10-CM | POA: Diagnosis not present

## 2017-11-21 ENCOUNTER — Encounter (HOSPITAL_COMMUNITY): Payer: Self-pay

## 2017-11-23 ENCOUNTER — Encounter (HOSPITAL_COMMUNITY)
Admission: RE | Admit: 2017-11-23 | Discharge: 2017-11-23 | Disposition: A | Discharge status: Home / Self Care | Payer: Self-pay | Source: Ambulatory Visit | Attending: Internal Medicine | Admitting: Internal Medicine

## 2017-11-26 ENCOUNTER — Other Ambulatory Visit (HOSPITAL_COMMUNITY): Payer: Self-pay | Admitting: Internal Medicine

## 2017-11-26 ENCOUNTER — Encounter (HOSPITAL_COMMUNITY)
Admission: RE | Admit: 2017-11-26 | Discharge: 2017-11-26 | Disposition: A | Discharge status: Home / Self Care | Payer: Self-pay | Source: Ambulatory Visit | Attending: Internal Medicine | Admitting: Internal Medicine

## 2017-11-26 DIAGNOSIS — Z950 Presence of cardiac pacemaker: Secondary | ICD-10-CM | POA: Insufficient documentation

## 2017-11-27 ENCOUNTER — Ambulatory Visit: Payer: Medicare Other

## 2017-11-28 ENCOUNTER — Encounter (HOSPITAL_COMMUNITY)
Admission: RE | Admit: 2017-11-28 | Discharge: 2017-11-28 | Disposition: A | Discharge status: Home / Self Care | Payer: Medicare Other | Source: Ambulatory Visit | Attending: Internal Medicine | Admitting: Internal Medicine

## 2017-11-30 ENCOUNTER — Encounter (HOSPITAL_COMMUNITY): Payer: Self-pay

## 2017-12-03 ENCOUNTER — Encounter (HOSPITAL_COMMUNITY)
Admission: RE | Admit: 2017-12-03 | Discharge: 2017-12-03 | Disposition: A | Discharge status: Home / Self Care | Payer: Self-pay | Source: Ambulatory Visit | Attending: Internal Medicine | Admitting: Internal Medicine

## 2017-12-05 ENCOUNTER — Encounter (HOSPITAL_COMMUNITY)
Admission: RE | Admit: 2017-12-05 | Discharge: 2017-12-05 | Disposition: A | Discharge status: Home / Self Care | Payer: Self-pay | Source: Ambulatory Visit | Attending: Internal Medicine | Admitting: Internal Medicine

## 2017-12-07 ENCOUNTER — Encounter (HOSPITAL_COMMUNITY)
Admission: RE | Admit: 2017-12-07 | Discharge: 2017-12-07 | Disposition: A | Discharge status: Home / Self Care | Payer: Self-pay | Source: Ambulatory Visit | Attending: Internal Medicine | Admitting: Internal Medicine

## 2017-12-10 ENCOUNTER — Encounter (HOSPITAL_COMMUNITY)
Admission: RE | Admit: 2017-12-10 | Discharge: 2017-12-10 | Disposition: A | Discharge status: Home / Self Care | Payer: Self-pay | Source: Ambulatory Visit | Attending: Internal Medicine | Admitting: Internal Medicine

## 2017-12-12 ENCOUNTER — Encounter (HOSPITAL_COMMUNITY)
Admission: RE | Admit: 2017-12-12 | Discharge: 2017-12-12 | Disposition: A | Discharge status: Home / Self Care | Payer: Self-pay | Source: Ambulatory Visit | Attending: Internal Medicine | Admitting: Internal Medicine

## 2017-12-14 ENCOUNTER — Encounter (HOSPITAL_COMMUNITY)
Admission: RE | Admit: 2017-12-14 | Discharge: 2017-12-14 | Disposition: A | Discharge status: Home / Self Care | Payer: Self-pay | Source: Ambulatory Visit | Attending: Internal Medicine | Admitting: Internal Medicine

## 2017-12-17 ENCOUNTER — Encounter (HOSPITAL_COMMUNITY)
Admission: RE | Admit: 2017-12-17 | Discharge: 2017-12-17 | Disposition: A | Discharge status: Home / Self Care | Payer: Medicare Other | Source: Ambulatory Visit | Attending: Internal Medicine | Admitting: Internal Medicine

## 2017-12-19 ENCOUNTER — Encounter (HOSPITAL_COMMUNITY)
Admission: RE | Admit: 2017-12-19 | Discharge: 2017-12-19 | Disposition: A | Discharge status: Home / Self Care | Payer: Medicare Other | Source: Ambulatory Visit | Attending: Internal Medicine | Admitting: Internal Medicine

## 2017-12-20 ENCOUNTER — Ambulatory Visit (INDEPENDENT_AMBULATORY_CARE_PROVIDER_SITE_OTHER): Payer: Medicare Other | Admitting: *Deleted

## 2017-12-20 DIAGNOSIS — I442 Atrioventricular block, complete: Secondary | ICD-10-CM | POA: Diagnosis not present

## 2017-12-21 ENCOUNTER — Encounter (HOSPITAL_COMMUNITY)
Admission: RE | Admit: 2017-12-21 | Discharge: 2017-12-21 | Disposition: A | Discharge status: Home / Self Care | Payer: Medicare Other | Source: Ambulatory Visit | Attending: Internal Medicine | Admitting: Internal Medicine

## 2017-12-21 ENCOUNTER — Encounter: Payer: Self-pay | Admitting: Cardiology

## 2017-12-21 NOTE — Progress Notes (Signed)
Remote pacemaker transmission.   

## 2017-12-24 ENCOUNTER — Encounter (HOSPITAL_COMMUNITY)
Admission: RE | Admit: 2017-12-24 | Discharge: 2017-12-24 | Disposition: A | Discharge status: Home / Self Care | Payer: Self-pay | Source: Ambulatory Visit | Attending: Internal Medicine | Admitting: Internal Medicine

## 2017-12-26 ENCOUNTER — Encounter (HOSPITAL_COMMUNITY)
Admission: RE | Admit: 2017-12-26 | Discharge: 2017-12-26 | Disposition: A | Discharge status: Home / Self Care | Payer: Self-pay | Source: Ambulatory Visit | Attending: Internal Medicine | Admitting: Internal Medicine

## 2017-12-26 DIAGNOSIS — Z950 Presence of cardiac pacemaker: Secondary | ICD-10-CM | POA: Insufficient documentation

## 2017-12-28 ENCOUNTER — Encounter (HOSPITAL_COMMUNITY): Payer: Self-pay

## 2017-12-31 ENCOUNTER — Encounter (HOSPITAL_COMMUNITY)
Admission: RE | Admit: 2017-12-31 | Discharge: 2017-12-31 | Disposition: A | Discharge status: Home / Self Care | Payer: Self-pay | Source: Ambulatory Visit | Attending: Internal Medicine | Admitting: Internal Medicine

## 2018-01-02 ENCOUNTER — Encounter (HOSPITAL_COMMUNITY): Payer: Self-pay

## 2018-01-04 ENCOUNTER — Encounter (HOSPITAL_COMMUNITY): Payer: Self-pay

## 2018-01-07 ENCOUNTER — Encounter (HOSPITAL_COMMUNITY): Payer: Self-pay

## 2018-01-08 ENCOUNTER — Other Ambulatory Visit (HOSPITAL_COMMUNITY): Payer: Self-pay | Admitting: Internal Medicine

## 2018-01-09 ENCOUNTER — Encounter (HOSPITAL_COMMUNITY): Payer: Self-pay

## 2018-01-11 ENCOUNTER — Encounter (HOSPITAL_COMMUNITY)
Admission: RE | Admit: 2018-01-11 | Discharge: 2018-01-11 | Disposition: A | Discharge status: Home / Self Care | Payer: Self-pay | Source: Ambulatory Visit | Attending: Internal Medicine | Admitting: Internal Medicine

## 2018-01-14 ENCOUNTER — Encounter (HOSPITAL_COMMUNITY): Payer: Self-pay

## 2018-01-15 LAB — CUP PACEART REMOTE DEVICE CHECK
Battery Remaining Longevity: 116 mo
Battery Voltage: 2.79 V
Brady Statistic AP VP Percent: 20 %
Brady Statistic AS VP Percent: 80 %
Date Time Interrogation Session: 20190425131014
Implantable Lead Implant Date: 20140625
Implantable Lead Location: 753859
Implantable Lead Model: 5076
Implantable Pulse Generator Implant Date: 20140625
Lead Channel Impedance Value: 474 Ohm
Lead Channel Pacing Threshold Amplitude: 0.625 V
Lead Channel Pacing Threshold Pulse Width: 0.4 ms
Lead Channel Setting Pacing Amplitude: 1.5 V
Lead Channel Setting Pacing Amplitude: 2 V
Lead Channel Setting Pacing Pulse Width: 0.4 ms
MDC IDC LEAD IMPLANT DT: 20140625
MDC IDC LEAD LOCATION: 753860
MDC IDC MSMT BATTERY IMPEDANCE: 275 Ohm
MDC IDC MSMT LEADCHNL RA PACING THRESHOLD PULSEWIDTH: 0.4 ms
MDC IDC MSMT LEADCHNL RV IMPEDANCE VALUE: 475 Ohm
MDC IDC MSMT LEADCHNL RV PACING THRESHOLD AMPLITUDE: 0.5 V
MDC IDC SET LEADCHNL RV SENSING SENSITIVITY: 2 mV
MDC IDC STAT BRADY AP VS PERCENT: 0 %
MDC IDC STAT BRADY AS VS PERCENT: 0 %

## 2018-01-16 ENCOUNTER — Encounter (HOSPITAL_COMMUNITY)
Admission: RE | Admit: 2018-01-16 | Discharge: 2018-01-16 | Disposition: A | Discharge status: Home / Self Care | Payer: Self-pay | Source: Ambulatory Visit | Attending: Internal Medicine | Admitting: Internal Medicine

## 2018-01-17 DIAGNOSIS — Z86010 Personal history of colonic polyps: Secondary | ICD-10-CM | POA: Diagnosis not present

## 2018-01-17 DIAGNOSIS — Z953 Presence of xenogenic heart valve: Secondary | ICD-10-CM | POA: Diagnosis not present

## 2018-01-17 DIAGNOSIS — C9191 Lymphoid leukemia, unspecified, in remission: Secondary | ICD-10-CM | POA: Diagnosis not present

## 2018-01-17 DIAGNOSIS — D497 Neoplasm of unspecified behavior of endocrine glands and other parts of nervous system: Secondary | ICD-10-CM | POA: Diagnosis not present

## 2018-01-17 DIAGNOSIS — R197 Diarrhea, unspecified: Secondary | ICD-10-CM | POA: Diagnosis not present

## 2018-01-18 ENCOUNTER — Encounter (HOSPITAL_COMMUNITY): Payer: Self-pay

## 2018-01-23 ENCOUNTER — Encounter (HOSPITAL_COMMUNITY)
Admission: RE | Admit: 2018-01-23 | Discharge: 2018-01-23 | Disposition: A | Discharge status: Home / Self Care | Payer: Medicare Other | Source: Ambulatory Visit | Attending: Internal Medicine | Admitting: Internal Medicine

## 2018-01-24 DIAGNOSIS — R197 Diarrhea, unspecified: Secondary | ICD-10-CM | POA: Diagnosis not present

## 2018-01-24 DIAGNOSIS — Z86010 Personal history of colonic polyps: Secondary | ICD-10-CM | POA: Diagnosis not present

## 2018-01-25 ENCOUNTER — Encounter (HOSPITAL_COMMUNITY): Payer: Self-pay

## 2018-01-28 ENCOUNTER — Encounter (HOSPITAL_COMMUNITY): Payer: Self-pay

## 2018-01-28 DIAGNOSIS — Z950 Presence of cardiac pacemaker: Secondary | ICD-10-CM | POA: Insufficient documentation

## 2018-01-30 ENCOUNTER — Encounter (HOSPITAL_COMMUNITY): Payer: Self-pay

## 2018-02-01 ENCOUNTER — Encounter (HOSPITAL_COMMUNITY)
Admission: RE | Admit: 2018-02-01 | Discharge: 2018-02-01 | Disposition: A | Discharge status: Home / Self Care | Payer: Self-pay | Source: Ambulatory Visit | Attending: Internal Medicine | Admitting: Internal Medicine

## 2018-02-04 ENCOUNTER — Encounter (HOSPITAL_COMMUNITY)
Admission: RE | Admit: 2018-02-04 | Discharge: 2018-02-04 | Disposition: A | Discharge status: Home / Self Care | Payer: Self-pay | Source: Ambulatory Visit | Attending: Internal Medicine | Admitting: Internal Medicine

## 2018-02-05 DIAGNOSIS — Z86010 Personal history of colonic polyps: Secondary | ICD-10-CM | POA: Diagnosis not present

## 2018-02-06 ENCOUNTER — Encounter (HOSPITAL_COMMUNITY)
Admission: RE | Admit: 2018-02-06 | Discharge: 2018-02-06 | Disposition: A | Discharge status: Home / Self Care | Payer: Self-pay | Source: Ambulatory Visit | Attending: Internal Medicine | Admitting: Internal Medicine

## 2018-02-08 ENCOUNTER — Encounter (HOSPITAL_COMMUNITY)
Admission: RE | Admit: 2018-02-08 | Discharge: 2018-02-08 | Disposition: A | Discharge status: Home / Self Care | Payer: Self-pay | Source: Ambulatory Visit | Attending: Internal Medicine | Admitting: Internal Medicine

## 2018-02-11 ENCOUNTER — Encounter (HOSPITAL_COMMUNITY): Payer: Self-pay

## 2018-02-13 ENCOUNTER — Encounter (HOSPITAL_COMMUNITY)
Admission: RE | Admit: 2018-02-13 | Discharge: 2018-02-13 | Disposition: A | Discharge status: Home / Self Care | Payer: Medicare Other | Source: Ambulatory Visit | Attending: Internal Medicine | Admitting: Internal Medicine

## 2018-02-15 ENCOUNTER — Encounter (HOSPITAL_COMMUNITY)
Admission: RE | Admit: 2018-02-15 | Discharge: 2018-02-15 | Disposition: A | Discharge status: Home / Self Care | Payer: Self-pay | Source: Ambulatory Visit | Attending: Internal Medicine | Admitting: Internal Medicine

## 2018-02-18 ENCOUNTER — Encounter (HOSPITAL_COMMUNITY): Payer: Self-pay

## 2018-02-20 ENCOUNTER — Encounter (HOSPITAL_COMMUNITY)
Admission: RE | Admit: 2018-02-20 | Discharge: 2018-02-20 | Disposition: A | Discharge status: Home / Self Care | Payer: Medicare Other | Source: Ambulatory Visit | Attending: Internal Medicine | Admitting: Internal Medicine

## 2018-02-22 ENCOUNTER — Encounter (HOSPITAL_COMMUNITY)
Admission: RE | Admit: 2018-02-22 | Discharge: 2018-02-22 | Disposition: A | Discharge status: Home / Self Care | Payer: Self-pay | Source: Ambulatory Visit | Attending: Internal Medicine | Admitting: Internal Medicine

## 2018-02-25 ENCOUNTER — Encounter (HOSPITAL_COMMUNITY)
Admission: RE | Admit: 2018-02-25 | Discharge: 2018-02-25 | Disposition: A | Discharge status: Home / Self Care | Payer: Self-pay | Source: Ambulatory Visit | Attending: Internal Medicine | Admitting: Internal Medicine

## 2018-02-25 DIAGNOSIS — Z950 Presence of cardiac pacemaker: Secondary | ICD-10-CM | POA: Insufficient documentation

## 2018-02-27 ENCOUNTER — Encounter (HOSPITAL_COMMUNITY): Payer: Self-pay

## 2018-02-27 DIAGNOSIS — Z6822 Body mass index (BMI) 22.0-22.9, adult: Secondary | ICD-10-CM | POA: Diagnosis not present

## 2018-02-27 DIAGNOSIS — S8002XA Contusion of left knee, initial encounter: Secondary | ICD-10-CM | POA: Diagnosis not present

## 2018-03-01 ENCOUNTER — Encounter (HOSPITAL_COMMUNITY)
Admission: RE | Admit: 2018-03-01 | Discharge: 2018-03-01 | Disposition: A | Discharge status: Home / Self Care | Payer: Self-pay | Source: Ambulatory Visit | Attending: Internal Medicine | Admitting: Internal Medicine

## 2018-03-04 ENCOUNTER — Encounter (HOSPITAL_COMMUNITY)
Admission: RE | Admit: 2018-03-04 | Discharge: 2018-03-04 | Disposition: A | Discharge status: Home / Self Care | Payer: Self-pay | Source: Ambulatory Visit | Attending: Internal Medicine | Admitting: Internal Medicine

## 2018-03-05 DIAGNOSIS — D497 Neoplasm of unspecified behavior of endocrine glands and other parts of nervous system: Secondary | ICD-10-CM | POA: Diagnosis not present

## 2018-03-05 DIAGNOSIS — R198 Other specified symptoms and signs involving the digestive system and abdomen: Secondary | ICD-10-CM | POA: Diagnosis not present

## 2018-03-05 DIAGNOSIS — Z86010 Personal history of colonic polyps: Secondary | ICD-10-CM | POA: Diagnosis not present

## 2018-03-05 DIAGNOSIS — C9191 Lymphoid leukemia, unspecified, in remission: Secondary | ICD-10-CM | POA: Diagnosis not present

## 2018-03-05 DIAGNOSIS — K5730 Diverticulosis of large intestine without perforation or abscess without bleeding: Secondary | ICD-10-CM | POA: Diagnosis not present

## 2018-03-05 DIAGNOSIS — N400 Enlarged prostate without lower urinary tract symptoms: Secondary | ICD-10-CM | POA: Diagnosis not present

## 2018-03-05 DIAGNOSIS — E1122 Type 2 diabetes mellitus with diabetic chronic kidney disease: Secondary | ICD-10-CM | POA: Diagnosis not present

## 2018-03-06 ENCOUNTER — Encounter (HOSPITAL_COMMUNITY)
Admission: RE | Admit: 2018-03-06 | Discharge: 2018-03-06 | Disposition: A | Discharge status: Home / Self Care | Payer: Self-pay | Source: Ambulatory Visit | Attending: Internal Medicine | Admitting: Internal Medicine

## 2018-03-08 ENCOUNTER — Encounter (HOSPITAL_COMMUNITY)
Admission: RE | Admit: 2018-03-08 | Discharge: 2018-03-08 | Disposition: A | Discharge status: Home / Self Care | Payer: Self-pay | Source: Ambulatory Visit | Attending: Internal Medicine | Admitting: Internal Medicine

## 2018-03-11 ENCOUNTER — Encounter (HOSPITAL_COMMUNITY): Payer: Self-pay

## 2018-03-11 DIAGNOSIS — R197 Diarrhea, unspecified: Secondary | ICD-10-CM | POA: Diagnosis not present

## 2018-03-11 DIAGNOSIS — R195 Other fecal abnormalities: Secondary | ICD-10-CM | POA: Diagnosis not present

## 2018-03-13 ENCOUNTER — Encounter (HOSPITAL_COMMUNITY)
Admission: RE | Admit: 2018-03-13 | Discharge: 2018-03-13 | Disposition: A | Discharge status: Home / Self Care | Payer: Self-pay | Source: Ambulatory Visit | Attending: Internal Medicine | Admitting: Internal Medicine

## 2018-03-15 ENCOUNTER — Encounter (HOSPITAL_COMMUNITY)
Admission: RE | Admit: 2018-03-15 | Discharge: 2018-03-15 | Disposition: A | Discharge status: Home / Self Care | Payer: Self-pay | Source: Ambulatory Visit | Attending: Internal Medicine | Admitting: Internal Medicine

## 2018-03-15 DIAGNOSIS — R195 Other fecal abnormalities: Secondary | ICD-10-CM | POA: Diagnosis not present

## 2018-03-15 DIAGNOSIS — R197 Diarrhea, unspecified: Secondary | ICD-10-CM | POA: Diagnosis not present

## 2018-03-18 ENCOUNTER — Encounter (HOSPITAL_COMMUNITY)
Admission: RE | Admit: 2018-03-18 | Discharge: 2018-03-18 | Disposition: A | Discharge status: Home / Self Care | Payer: Medicare Other | Source: Ambulatory Visit | Attending: Internal Medicine | Admitting: Internal Medicine

## 2018-03-20 ENCOUNTER — Encounter (HOSPITAL_COMMUNITY)
Admission: RE | Admit: 2018-03-20 | Discharge: 2018-03-20 | Disposition: A | Discharge status: Home / Self Care | Payer: Self-pay | Source: Ambulatory Visit | Attending: Internal Medicine | Admitting: Internal Medicine

## 2018-03-21 ENCOUNTER — Ambulatory Visit (INDEPENDENT_AMBULATORY_CARE_PROVIDER_SITE_OTHER): Payer: Medicare Other | Admitting: *Deleted

## 2018-03-21 DIAGNOSIS — I442 Atrioventricular block, complete: Secondary | ICD-10-CM

## 2018-03-21 NOTE — Progress Notes (Signed)
Remote pacemaker transmission.   

## 2018-03-22 ENCOUNTER — Encounter (HOSPITAL_COMMUNITY)
Admission: RE | Admit: 2018-03-22 | Discharge: 2018-03-22 | Disposition: A | Discharge status: Home / Self Care | Payer: Self-pay | Source: Ambulatory Visit | Attending: Internal Medicine | Admitting: Internal Medicine

## 2018-03-25 ENCOUNTER — Encounter (HOSPITAL_COMMUNITY)
Admission: RE | Admit: 2018-03-25 | Discharge: 2018-03-25 | Disposition: A | Discharge status: Home / Self Care | Payer: Self-pay | Source: Ambulatory Visit | Attending: Internal Medicine | Admitting: Internal Medicine

## 2018-03-27 ENCOUNTER — Encounter (HOSPITAL_COMMUNITY)
Admission: RE | Admit: 2018-03-27 | Discharge: 2018-03-27 | Disposition: A | Discharge status: Home / Self Care | Payer: Self-pay | Source: Ambulatory Visit | Attending: Internal Medicine | Admitting: Internal Medicine

## 2018-03-29 ENCOUNTER — Encounter (HOSPITAL_COMMUNITY)
Admission: RE | Admit: 2018-03-29 | Discharge: 2018-03-29 | Disposition: A | Discharge status: Home / Self Care | Payer: Self-pay | Source: Ambulatory Visit | Attending: Internal Medicine | Admitting: Internal Medicine

## 2018-03-29 DIAGNOSIS — Z950 Presence of cardiac pacemaker: Secondary | ICD-10-CM | POA: Insufficient documentation

## 2018-04-01 ENCOUNTER — Encounter (HOSPITAL_COMMUNITY)
Admission: RE | Admit: 2018-04-01 | Discharge: 2018-04-01 | Disposition: A | Discharge status: Home / Self Care | Payer: Self-pay | Source: Ambulatory Visit | Attending: Internal Medicine | Admitting: Internal Medicine

## 2018-04-03 ENCOUNTER — Encounter (HOSPITAL_COMMUNITY)
Admission: RE | Admit: 2018-04-03 | Discharge: 2018-04-03 | Disposition: A | Discharge status: Home / Self Care | Payer: Self-pay | Source: Ambulatory Visit | Attending: Internal Medicine | Admitting: Internal Medicine

## 2018-04-05 ENCOUNTER — Encounter (HOSPITAL_COMMUNITY)
Admission: RE | Admit: 2018-04-05 | Discharge: 2018-04-05 | Disposition: A | Discharge status: Home / Self Care | Payer: Self-pay | Source: Ambulatory Visit | Attending: Internal Medicine | Admitting: Internal Medicine

## 2018-04-08 ENCOUNTER — Encounter (HOSPITAL_COMMUNITY): Payer: Self-pay

## 2018-04-10 ENCOUNTER — Encounter (HOSPITAL_COMMUNITY)
Admission: RE | Admit: 2018-04-10 | Discharge: 2018-04-10 | Disposition: A | Discharge status: Home / Self Care | Payer: Self-pay | Source: Ambulatory Visit | Attending: Internal Medicine | Admitting: Internal Medicine

## 2018-04-12 ENCOUNTER — Encounter (HOSPITAL_COMMUNITY)
Admission: RE | Admit: 2018-04-12 | Discharge: 2018-04-12 | Disposition: A | Discharge status: Home / Self Care | Payer: Self-pay | Source: Ambulatory Visit | Attending: Internal Medicine | Admitting: Internal Medicine

## 2018-04-15 ENCOUNTER — Encounter (HOSPITAL_COMMUNITY)
Admission: RE | Admit: 2018-04-15 | Discharge: 2018-04-15 | Disposition: A | Discharge status: Home / Self Care | Payer: Self-pay | Source: Ambulatory Visit | Attending: Internal Medicine | Admitting: Internal Medicine

## 2018-04-16 DIAGNOSIS — R194 Change in bowel habit: Secondary | ICD-10-CM | POA: Diagnosis not present

## 2018-04-17 ENCOUNTER — Encounter (HOSPITAL_COMMUNITY)
Admission: RE | Admit: 2018-04-17 | Discharge: 2018-04-17 | Disposition: A | Discharge status: Home / Self Care | Payer: Self-pay | Source: Ambulatory Visit | Attending: Internal Medicine | Admitting: Internal Medicine

## 2018-04-17 ENCOUNTER — Other Ambulatory Visit (HOSPITAL_COMMUNITY): Payer: Self-pay | Admitting: Internal Medicine

## 2018-04-19 ENCOUNTER — Encounter (HOSPITAL_COMMUNITY): Payer: Self-pay

## 2018-04-22 ENCOUNTER — Encounter (HOSPITAL_COMMUNITY)
Admission: RE | Admit: 2018-04-22 | Discharge: 2018-04-22 | Disposition: A | Discharge status: Home / Self Care | Payer: Self-pay | Source: Ambulatory Visit | Attending: Internal Medicine | Admitting: Internal Medicine

## 2018-04-24 ENCOUNTER — Encounter (HOSPITAL_COMMUNITY)
Admission: RE | Admit: 2018-04-24 | Discharge: 2018-04-24 | Disposition: A | Discharge status: Home / Self Care | Payer: Self-pay | Source: Ambulatory Visit | Attending: Internal Medicine | Admitting: Internal Medicine

## 2018-04-24 ENCOUNTER — Other Ambulatory Visit (HOSPITAL_COMMUNITY): Payer: Self-pay | Admitting: Internal Medicine

## 2018-04-25 ENCOUNTER — Other Ambulatory Visit (HOSPITAL_COMMUNITY): Payer: Self-pay

## 2018-04-25 MED ORDER — FUROSEMIDE 40 MG PO TABS: ORAL_TABLET | ORAL | 1 refills | Status: DC

## 2018-04-26 ENCOUNTER — Encounter (HOSPITAL_COMMUNITY)
Admission: RE | Admit: 2018-04-26 | Discharge: 2018-04-26 | Disposition: A | Discharge status: Home / Self Care | Payer: Self-pay | Source: Ambulatory Visit | Attending: Internal Medicine | Admitting: Internal Medicine

## 2018-04-29 LAB — CUP PACEART REMOTE DEVICE CHECK
Battery Impedance: 299 Ohm
Battery Remaining Longevity: 112 mo
Battery Voltage: 2.79 V
Brady Statistic AP VP Percent: 22 %
Brady Statistic AS VP Percent: 78 %
Brady Statistic AS VS Percent: 0 %
Implantable Lead Implant Date: 20140625
Implantable Lead Location: 753860
Implantable Lead Model: 5076
Implantable Pulse Generator Implant Date: 20140625
Lead Channel Impedance Value: 460 Ohm
Lead Channel Impedance Value: 472 Ohm
Lead Channel Setting Pacing Amplitude: 2 V
Lead Channel Setting Pacing Pulse Width: 0.4 ms
Lead Channel Setting Sensing Sensitivity: 2 mV
MDC IDC LEAD IMPLANT DT: 20140625
MDC IDC LEAD LOCATION: 753859
MDC IDC MSMT LEADCHNL RA PACING THRESHOLD AMPLITUDE: 0.625 V
MDC IDC MSMT LEADCHNL RA PACING THRESHOLD PULSEWIDTH: 0.4 ms
MDC IDC MSMT LEADCHNL RV PACING THRESHOLD AMPLITUDE: 0.5 V
MDC IDC MSMT LEADCHNL RV PACING THRESHOLD PULSEWIDTH: 0.4 ms
MDC IDC SESS DTM: 20190725133528
MDC IDC SET LEADCHNL RA PACING AMPLITUDE: 1.5 V
MDC IDC STAT BRADY AP VS PERCENT: 0 %

## 2018-05-01 ENCOUNTER — Encounter (HOSPITAL_COMMUNITY)
Admission: RE | Admit: 2018-05-01 | Discharge: 2018-05-01 | Disposition: A | Discharge status: Home / Self Care | Payer: Self-pay | Source: Ambulatory Visit | Attending: Internal Medicine | Admitting: Internal Medicine

## 2018-05-01 DIAGNOSIS — Z952 Presence of prosthetic heart valve: Secondary | ICD-10-CM | POA: Insufficient documentation

## 2018-05-01 DIAGNOSIS — Z48812 Encounter for surgical aftercare following surgery on the circulatory system: Secondary | ICD-10-CM | POA: Insufficient documentation

## 2018-05-01 DIAGNOSIS — I509 Heart failure, unspecified: Secondary | ICD-10-CM | POA: Insufficient documentation

## 2018-05-03 ENCOUNTER — Encounter (HOSPITAL_COMMUNITY)
Admission: RE | Admit: 2018-05-03 | Discharge: 2018-05-03 | Disposition: A | Discharge status: Home / Self Care | Payer: Self-pay | Source: Ambulatory Visit | Attending: Internal Medicine | Admitting: Internal Medicine

## 2018-05-06 ENCOUNTER — Encounter (HOSPITAL_COMMUNITY): Payer: Self-pay

## 2018-05-08 ENCOUNTER — Encounter (HOSPITAL_COMMUNITY)
Admission: RE | Admit: 2018-05-08 | Discharge: 2018-05-08 | Disposition: A | Discharge status: Home / Self Care | Payer: Self-pay | Source: Ambulatory Visit | Attending: Internal Medicine | Admitting: Internal Medicine

## 2018-05-10 ENCOUNTER — Encounter (HOSPITAL_COMMUNITY)
Admission: RE | Admit: 2018-05-10 | Discharge: 2018-05-10 | Disposition: A | Discharge status: Home / Self Care | Payer: Self-pay | Source: Ambulatory Visit | Attending: Internal Medicine | Admitting: Internal Medicine

## 2018-05-13 ENCOUNTER — Encounter (HOSPITAL_COMMUNITY)
Admission: RE | Admit: 2018-05-13 | Discharge: 2018-05-13 | Disposition: A | Discharge status: Home / Self Care | Payer: Self-pay | Source: Ambulatory Visit | Attending: Internal Medicine | Admitting: Internal Medicine

## 2018-05-15 ENCOUNTER — Encounter (HOSPITAL_COMMUNITY)
Admission: RE | Admit: 2018-05-15 | Discharge: 2018-05-15 | Disposition: A | Discharge status: Home / Self Care | Payer: Self-pay | Source: Ambulatory Visit | Attending: Internal Medicine | Admitting: Internal Medicine

## 2018-05-17 ENCOUNTER — Encounter (HOSPITAL_COMMUNITY): Payer: Self-pay

## 2018-05-20 ENCOUNTER — Encounter (HOSPITAL_COMMUNITY)
Admission: RE | Admit: 2018-05-20 | Discharge: 2018-05-20 | Disposition: A | Discharge status: Home / Self Care | Payer: Self-pay | Source: Ambulatory Visit | Attending: Internal Medicine | Admitting: Internal Medicine

## 2018-05-22 ENCOUNTER — Encounter (HOSPITAL_COMMUNITY)
Admission: RE | Admit: 2018-05-22 | Discharge: 2018-05-22 | Disposition: A | Discharge status: Home / Self Care | Payer: Self-pay | Source: Ambulatory Visit | Attending: Internal Medicine | Admitting: Internal Medicine

## 2018-05-23 DIAGNOSIS — L821 Other seborrheic keratosis: Secondary | ICD-10-CM | POA: Diagnosis not present

## 2018-05-23 DIAGNOSIS — D1801 Hemangioma of skin and subcutaneous tissue: Secondary | ICD-10-CM | POA: Diagnosis not present

## 2018-05-23 DIAGNOSIS — D2261 Melanocytic nevi of right upper limb, including shoulder: Secondary | ICD-10-CM | POA: Diagnosis not present

## 2018-05-23 DIAGNOSIS — D2272 Melanocytic nevi of left lower limb, including hip: Secondary | ICD-10-CM | POA: Diagnosis not present

## 2018-05-23 DIAGNOSIS — L570 Actinic keratosis: Secondary | ICD-10-CM | POA: Diagnosis not present

## 2018-05-23 DIAGNOSIS — Z85828 Personal history of other malignant neoplasm of skin: Secondary | ICD-10-CM | POA: Diagnosis not present

## 2018-05-23 DIAGNOSIS — D2262 Melanocytic nevi of left upper limb, including shoulder: Secondary | ICD-10-CM | POA: Diagnosis not present

## 2018-05-23 DIAGNOSIS — D225 Melanocytic nevi of trunk: Secondary | ICD-10-CM | POA: Diagnosis not present

## 2018-05-23 DIAGNOSIS — D2271 Melanocytic nevi of right lower limb, including hip: Secondary | ICD-10-CM | POA: Diagnosis not present

## 2018-05-24 ENCOUNTER — Encounter (HOSPITAL_COMMUNITY)
Admission: RE | Admit: 2018-05-24 | Discharge: 2018-05-24 | Disposition: A | Discharge status: Home / Self Care | Payer: Self-pay | Source: Ambulatory Visit | Attending: Internal Medicine | Admitting: Internal Medicine

## 2018-05-27 ENCOUNTER — Encounter (HOSPITAL_COMMUNITY)
Admission: RE | Admit: 2018-05-27 | Discharge: 2018-05-27 | Disposition: A | Discharge status: Home / Self Care | Payer: Self-pay | Source: Ambulatory Visit | Attending: Internal Medicine | Admitting: Internal Medicine

## 2018-05-29 ENCOUNTER — Encounter (HOSPITAL_COMMUNITY)
Admission: RE | Admit: 2018-05-29 | Discharge: 2018-05-29 | Disposition: A | Discharge status: Home / Self Care | Payer: Self-pay | Source: Ambulatory Visit | Attending: Internal Medicine | Admitting: Internal Medicine

## 2018-05-29 DIAGNOSIS — I509 Heart failure, unspecified: Secondary | ICD-10-CM | POA: Insufficient documentation

## 2018-05-29 DIAGNOSIS — Z952 Presence of prosthetic heart valve: Secondary | ICD-10-CM | POA: Insufficient documentation

## 2018-05-29 DIAGNOSIS — Z48812 Encounter for surgical aftercare following surgery on the circulatory system: Secondary | ICD-10-CM | POA: Insufficient documentation

## 2018-05-31 ENCOUNTER — Encounter (HOSPITAL_COMMUNITY)
Admission: RE | Admit: 2018-05-31 | Discharge: 2018-05-31 | Disposition: A | Discharge status: Home / Self Care | Payer: Self-pay | Source: Ambulatory Visit | Attending: Internal Medicine | Admitting: Internal Medicine

## 2018-06-03 ENCOUNTER — Encounter (HOSPITAL_COMMUNITY): Payer: Self-pay

## 2018-06-05 ENCOUNTER — Encounter (HOSPITAL_COMMUNITY)
Admission: RE | Admit: 2018-06-05 | Discharge: 2018-06-05 | Disposition: A | Discharge status: Home / Self Care | Payer: Self-pay | Source: Ambulatory Visit | Attending: Internal Medicine | Admitting: Internal Medicine

## 2018-06-07 ENCOUNTER — Encounter (HOSPITAL_COMMUNITY): Payer: Self-pay

## 2018-06-10 ENCOUNTER — Encounter (HOSPITAL_COMMUNITY): Payer: Self-pay

## 2018-06-12 ENCOUNTER — Encounter (HOSPITAL_COMMUNITY)
Admission: RE | Admit: 2018-06-12 | Discharge: 2018-06-12 | Disposition: A | Discharge status: Home / Self Care | Payer: Medicare Other | Source: Ambulatory Visit | Attending: Internal Medicine | Admitting: Internal Medicine

## 2018-06-14 ENCOUNTER — Ambulatory Visit (HOSPITAL_COMMUNITY)
Admission: RE | Admit: 2018-06-14 | Discharge: 2018-06-14 | Disposition: A | Discharge status: Home / Self Care | Payer: Medicare Other | Source: Ambulatory Visit | Attending: Internal Medicine | Admitting: Internal Medicine

## 2018-06-14 ENCOUNTER — Encounter (HOSPITAL_COMMUNITY): Payer: Self-pay

## 2018-06-14 ENCOUNTER — Encounter (HOSPITAL_COMMUNITY): Payer: Self-pay | Admitting: Internal Medicine

## 2018-06-14 ENCOUNTER — Other Ambulatory Visit: Payer: Self-pay

## 2018-06-14 VITALS — BP 133/51 | HR 59 | Wt 121.2 lb

## 2018-06-14 DIAGNOSIS — Z8547 Personal history of malignant neoplasm of testis: Secondary | ICD-10-CM | POA: Diagnosis not present

## 2018-06-14 DIAGNOSIS — I442 Atrioventricular block, complete: Secondary | ICD-10-CM | POA: Diagnosis not present

## 2018-06-14 DIAGNOSIS — F329 Major depressive disorder, single episode, unspecified: Secondary | ICD-10-CM | POA: Diagnosis not present

## 2018-06-14 DIAGNOSIS — Z7982 Long term (current) use of aspirin: Secondary | ICD-10-CM | POA: Diagnosis not present

## 2018-06-14 DIAGNOSIS — I447 Left bundle-branch block, unspecified: Secondary | ICD-10-CM | POA: Diagnosis not present

## 2018-06-14 DIAGNOSIS — J45909 Unspecified asthma, uncomplicated: Secondary | ICD-10-CM | POA: Diagnosis not present

## 2018-06-14 DIAGNOSIS — I359 Nonrheumatic aortic valve disorder, unspecified: Secondary | ICD-10-CM

## 2018-06-14 DIAGNOSIS — E119 Type 2 diabetes mellitus without complications: Secondary | ICD-10-CM | POA: Insufficient documentation

## 2018-06-14 DIAGNOSIS — E785 Hyperlipidemia, unspecified: Secondary | ICD-10-CM | POA: Insufficient documentation

## 2018-06-14 DIAGNOSIS — Z950 Presence of cardiac pacemaker: Secondary | ICD-10-CM | POA: Insufficient documentation

## 2018-06-14 DIAGNOSIS — Z79899 Other long term (current) drug therapy: Secondary | ICD-10-CM | POA: Diagnosis not present

## 2018-06-14 DIAGNOSIS — Z952 Presence of prosthetic heart valve: Secondary | ICD-10-CM

## 2018-06-14 DIAGNOSIS — Z953 Presence of xenogenic heart valve: Secondary | ICD-10-CM | POA: Insufficient documentation

## 2018-06-14 NOTE — Patient Instructions (Signed)
Your physician has requested that you have an echocardiogram. Echocardiography is a painless test that uses sound waves to create images of your heart. It provides your doctor with information about the size and shape of your heart and how well your heart's chambers and valves are working. This procedure takes approximately one hour. There are no restrictions for this procedure.  THIS WILL BE DONE AT [facility], THEY WILL CALL YOU TO SCHEDULE  We will contact you in 18 months to schedule your next appointment.

## 2018-06-14 NOTE — Addendum Note (Signed)
Encounter addended by: Scarlette Calico, RN on: 06/14/2018 9:52 AM  Actions taken: Order list changed, Diagnosis association updated, Sign clinical note

## 2018-06-14 NOTE — Progress Notes (Signed)
CARDIOLOGY CLINIC NOTE  Patient ID: [name], male   DOB: 07/21/1941, 77 y.o.   MRN: [id number]  PCP: Dr. Tor Netters is 77 y/o male with h/o AS s/p bioprosthetic AVR (01/2013), DM2, CHB (s/p pacemaker) and hyperlipidemia. Bioprosthetic AVR with ligation of diagonal-PA fistula by Dr. Cyndia Bent on 02/17/13. He had post-operative complete heart block. A Medtronic dual chamber pacemaker was placed 02/19/13.   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 Leamon Arnt.  No real progression over past 5-6 years.   Continues to do well.  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  Continues to do CR maintenance program 3x/week. No problems. Denies CP or SOB. Has chronic ankle edema and takes lasix for it. Takes abx when he goes to dentist. Has stopped atorvastatin due to mylagias.   Echo 9/17: EF 60% AVR stable   ROS: All systems negative except as listed in HPI, PMH and Problem List.  SH: Married, lives in [place], nonsmoker, works at the [employer].   Past Medical History  Diagnosis Date  . Aortic stenosis     moderate. Echo 3/10: EF 55-60%. mean gradient 25 mmHg. AVA 1.23cm2. Echo 3/11: Normal EF. mean AVA gradient 38mmHg. exercise myoview 2008: positive ECG. normal perfusion.  Bioprosthetic AVR with ligation of diagonal-PA fistula  . Exposure to secondhand smoke     extensive exposure  . Hyperlipidemia     treated  . Depression   . Asthma   . Heart murmur   . LBBB (left bundle branch block)   . Shortness of breath     exertion  . DM2 (diabetes mellitus, type 2)     fasting blood sugar - 90s  . Dizziness   . Testicular cancer     s/p XRT and surgical resection.  . Chronic lymphocytic leukemia     followed by Dr. Ouida Sills  - CHB post-op AoV surgery.  Medtronic dual chamber PCM.    Current Outpatient Medications  Medication Sig Dispense Refill  . acetaminophen (TYLENOL) 500 MG tablet Take 500 mg by mouth 3 (three) times daily.     Marland Kitchen aspirin 81 MG tablet Take 1 tablet (81 mg total) by mouth daily. 30 tablet 11  . busPIRone (BUSPAR) 15 MG tablet Take 15-30 mg by mouth See admin instructions. One tablet by mouth every morning and two tablets by mouth every day at noon    . Cholecalciferol (VITAMIN D) 2000 units tablet Take 2,000 Units by mouth every other day.     . cholestyramine light (PREVALITE) 4 GM/DOSE powder Take 4 g by mouth 2 (two) times daily with a meal.    . cycloSPORINE (RESTASIS) 0.05 % ophthalmic emulsion Place 1 drop into both eyes 2 (two) times daily.     Marland Kitchen desloratadine (CLARINEX) 5 MG tablet Take 5 mg by mouth daily.     . fenofibrate 160 MG tablet Take 160 mg by mouth daily.     . fluticasone (FLONASE) 50 MCG/ACT nasal spray Place 2 sprays into the nose daily.     Marland Kitchen FORA V12 BLOOD GLUCOSE TEST test strip     . furosemide (LASIX) 40 MG tablet Take 40 mg by mouth 2 (two) times daily.     Marland Kitchen gabapentin (NEURONTIN) 300 MG capsule Take one (1) capsule (300 mg total) by mouth each morning. Take  two (2) capsules (600 mg total) by mouth each afternoon.    Marland Kitchen LITETOUCH LANCETS MISC     . Multiple Vitamin (MULTIVITAMIN WITH MINERALS) TABS tablet Take 1 tablet by mouth daily.    Vladimir Faster Glycol-Propyl Glycol (SYSTANE OP) Place 1 drop into both eyes 2 (two) times daily.    . potassium chloride SA (K-DUR,KLOR-CON) 20 MEQ tablet TAKE ONE TABLET EVERY DAY 90 tablet 3  . Probiotic Product (PHILLIPS COLON HEALTH PO) Take 1 capsule by mouth daily.    Marland Kitchen PROCTOZONE-HC 2.5 % rectal cream Place 1 application rectally 2 (two) times daily as needed.     . sertraline (ZOLOFT) 100 MG tablet Take 100 mg by mouth daily.     . traZODone (DESYREL) 50 MG tablet Take 150 mg by mouth at bedtime.      No current facility-administered medications for this encounter.      PHYSICAL  EXAM: Vitals:   06/14/18 0915  BP: (!) 133/51  Pulse: (!) 59  SpO2: 100%  Weight: 55 kg (121 lb 3.2 oz)   General:  Well appearing. No resp difficulty HEENT: normal Neck: supple. no JVD. Carotids 2+ bilat; no bruits. No lymphadenopathy or thryomegaly appreciated. Cor: PMI nondisplaced. Regular rate & rhythm. Soft SEM at RUSB s2 crisp  Lungs: clear Abdomen: soft, nontender, nondistended. No hepatosplenomegaly. No bruits or masses. Good bowel sounds. Extremities: no cyanosis, clubbing, rash, trace-1+ ankle edema Neuro: alert & orientedx3, cranial nerves grossly intact. moves all 4 extremities w/o difficulty. Affect pleasant   ASSESSMENT & PLAN: 1. Status post bioprosthetic AVR: Stable. Doing well continues with CR maintenance program. Will order repeat echo. He is compliant with SBE prophylaxis 2. Complete heart block: Medtronic PCM.  Followed by Dr. Caryl Comes.  3. Diagonal-PA fistula: Ligated at time of AVR.  4. HL - stopped atorvastatin due to mylagias. Following with Dr. Stephanie Acre. If LDL is still elevated can try Crestor 5mg  qOD.   F/u 18 months.   Glori Bickers MD 06/14/2018

## 2018-06-17 ENCOUNTER — Encounter (HOSPITAL_COMMUNITY)
Admission: RE | Admit: 2018-06-17 | Discharge: 2018-06-17 | Disposition: A | Discharge status: Home / Self Care | Payer: Self-pay | Source: Ambulatory Visit | Attending: Internal Medicine | Admitting: Internal Medicine

## 2018-06-19 ENCOUNTER — Other Ambulatory Visit (HOSPITAL_COMMUNITY): Payer: Self-pay | Admitting: Internal Medicine

## 2018-06-19 ENCOUNTER — Encounter (HOSPITAL_COMMUNITY)
Admission: RE | Admit: 2018-06-19 | Discharge: 2018-06-19 | Disposition: A | Discharge status: Home / Self Care | Payer: Self-pay | Source: Ambulatory Visit | Attending: Internal Medicine | Admitting: Internal Medicine

## 2018-06-20 ENCOUNTER — Other Ambulatory Visit (HOSPITAL_COMMUNITY): Payer: Self-pay

## 2018-06-20 ENCOUNTER — Ambulatory Visit (INDEPENDENT_AMBULATORY_CARE_PROVIDER_SITE_OTHER): Payer: Medicare Other | Admitting: *Deleted

## 2018-06-20 DIAGNOSIS — I442 Atrioventricular block, complete: Secondary | ICD-10-CM

## 2018-06-20 MED ORDER — FUROSEMIDE 40 MG PO TABS: 40.0000 mg | ORAL_TABLET | Freq: Two times a day (BID) | ORAL | 5 refills | Status: DC

## 2018-06-20 NOTE — Progress Notes (Signed)
Remote pacemaker transmission.   

## 2018-06-21 ENCOUNTER — Other Ambulatory Visit (HOSPITAL_COMMUNITY): Payer: Self-pay

## 2018-06-21 ENCOUNTER — Encounter (HOSPITAL_COMMUNITY)
Admission: RE | Admit: 2018-06-21 | Discharge: 2018-06-21 | Disposition: A | Discharge status: Home / Self Care | Payer: Self-pay | Source: Ambulatory Visit | Attending: Internal Medicine | Admitting: Internal Medicine

## 2018-06-21 MED ORDER — FUROSEMIDE 40 MG PO TABS: 40.0000 mg | ORAL_TABLET | Freq: Two times a day (BID) | ORAL | 5 refills | Status: DC

## 2018-06-24 ENCOUNTER — Encounter: Payer: Self-pay | Admitting: Internal Medicine

## 2018-06-24 ENCOUNTER — Ambulatory Visit (HOSPITAL_COMMUNITY): Payer: Medicare Other | Attending: Internal Medicine

## 2018-06-24 ENCOUNTER — Ambulatory Visit (INDEPENDENT_AMBULATORY_CARE_PROVIDER_SITE_OTHER): Payer: Medicare Other | Admitting: Internal Medicine

## 2018-06-24 ENCOUNTER — Other Ambulatory Visit: Payer: Self-pay

## 2018-06-24 ENCOUNTER — Encounter (HOSPITAL_COMMUNITY): Payer: Self-pay

## 2018-06-24 VITALS — BP 130/66 | HR 60 | Ht 63.0 in | Wt 122.2 lb

## 2018-06-24 DIAGNOSIS — I5032 Chronic diastolic (congestive) heart failure: Secondary | ICD-10-CM

## 2018-06-24 DIAGNOSIS — Z952 Presence of prosthetic heart valve: Secondary | ICD-10-CM | POA: Insufficient documentation

## 2018-06-24 DIAGNOSIS — Z950 Presence of cardiac pacemaker: Secondary | ICD-10-CM | POA: Diagnosis not present

## 2018-06-24 DIAGNOSIS — I442 Atrioventricular block, complete: Secondary | ICD-10-CM | POA: Diagnosis not present

## 2018-06-24 DIAGNOSIS — Z23 Encounter for immunization: Secondary | ICD-10-CM | POA: Diagnosis not present

## 2018-06-24 LAB — ECHOCARDIOGRAM COMPLETE
Height: 63 in
Weight: 1955.2 oz

## 2018-06-24 MED ORDER — ATORVASTATIN CALCIUM 40 MG PO TABS: 40.0000 mg | ORAL_TABLET | ORAL | 3 refills | Status: DC

## 2018-06-24 NOTE — Patient Instructions (Addendum)
Medication Instructions:  Your physician has recommended you make the following change in your medication:   Begin taking your Lipitor, 40mg , two times per week  Labwork: None ordered.  Testing/Procedures: None ordered.  Follow-Up: Your physician recommends that you schedule a follow-up appointment in:   One year with Chanetta Marshall, NP   Remote monitoring is used to monitor your Pacemaker of ICD from home. This monitoring reduces the number of office visits required to check your device to one time per year. It allows [place] to keep an eye on the functioning of your device to ensure it is working properly. You are scheduled for a device check from home on 09/19/17. You may send your transmission at any time that day. If you have a wireless device, the transmission will be sent automatically. After your physician reviews your transmission, you will receive a postcard with your next transmission date.    Any Other Special Instructions Will Be Listed Below (If Applicable).     If you need a refill on your cardiac medications before your next appointment, please call your pharmacy.

## 2018-06-24 NOTE — Progress Notes (Signed)
Patient Care Team: Jonathon Jordan, MD as PCP - General (Family Medicine) Alessandra Grout, MD (Inactive) as Referring Physician (Family Medicine) Bensimhon, Shaune Pascal, MD as Attending Physician (Cardiology) Leslie Andrea, MD as Referring Physician (Internal Medicine)   HPI  [name] is a 77 y.o. male Seen for pacemaker followup.  Underwent PPM implantation 6/14 for CHB following AVR bioprothesis   Ejection fraction postoperatively an ejection fraction 55-60%  No chest pain  Some edema  Denies sob  No chest pain   Has stopped his statin because of muscle aches with rapid resolution.      Past Medical History:  Diagnosis Date  . Aortic stenosis    s/p AVR 6/14  . Asthma   . Atrioventricular block, complete ([place])    a. s/p MDT dual chamber PPM 2014  . Chronic lymphocytic leukemia ([place])    followed by Dr. Ouida Sills  . Depression   . DM2 (diabetes mellitus, type 2) (HCC)    fasting blood sugar - 90s  . Exposure to secondhand smoke    extensive exposure  . Hyperlipidemia    treated  . LBBB (left bundle branch block)   . Testicular cancer [facility])    s/p XRT and surgical resection.    Past Surgical History:  Procedure Laterality Date  . AORTIC VALVE REPLACEMENT N/A 02/17/2013   Procedure: AORTIC VALVE REPLACEMENT (AVR);  Surgeon: Gaye Pollack, MD;  Location: [place];  Service: Open Heart Surgery;  Laterality: N/A;  . CYST REMOVAL NECK    . INNER EAR SURGERY    . INTRAOPERATIVE TRANSESOPHAGEAL ECHOCARDIOGRAM N/A 02/17/2013   Procedure: INTRAOPERATIVE TRANSESOPHAGEAL ECHOCARDIOGRAM;  Surgeon: Gaye Pollack, MD;  Location: [facility] OR;  Service: Open Heart Surgery;  Laterality: N/A;  . LEFT AND RIGHT HEART CATHETERIZATION WITH CORONARY ANGIOGRAM N/A 12/25/2012   Procedure: LEFT AND RIGHT HEART CATHETERIZATION WITH CORONARY ANGIOGRAM;  Surgeon: Jolaine Artist, MD;  Location: [facility] CATH LAB;  Service: Cardiovascular;  Laterality: N/A;  . PERMANENT PACEMAKER  INSERTION N/A 02/19/2013   Procedure: PERMANENT PACEMAKER INSERTION;  Surgeon: Deboraha Sprang, MD;  Location: [facility] CATH LAB;  Service: Cardiovascular;  Laterality: N/A;  . RECTAL BIOPSY N/A 10/08/2017   Procedure: EXCISION OF ANAL POLYP ;  Surgeon: Ileana Roup, MD;  Location: [facility] ORS;  Service: General;  Laterality: N/A;  . stapendectomy  07/30/02   right  . XRT and surgical resection     s/p. 10 years ago    Current Outpatient Medications  Medication Sig Dispense Refill  . acetaminophen (TYLENOL) 500 MG tablet Take 500 mg by mouth 3 (three) times daily.     Marland Kitchen aspirin 81 MG tablet Take 1 tablet (81 mg total) by mouth daily. 30 tablet 11  . busPIRone (BUSPAR) 15 MG tablet Take 15-30 mg by mouth See admin instructions. One tablet by mouth every morning and two tablets by mouth every day at noon    . Cholecalciferol (VITAMIN D) 2000 units tablet Take 2,000 Units by mouth every other day.     . cholestyramine light (PREVALITE) 4 GM/DOSE powder Take 4 g by mouth 2 (two) times daily with a meal.    . cycloSPORINE (RESTASIS) 0.05 % ophthalmic emulsion Place 1 drop into both eyes 2 (two) times daily.     Marland Kitchen desloratadine (CLARINEX) 5 MG tablet Take 5 mg by mouth daily.     . fenofibrate 160 MG tablet Take 160 mg by mouth daily.     Marland Kitchen  fluticasone (FLONASE) 50 MCG/ACT nasal spray Place 2 sprays into the nose daily.     Marland Kitchen FORA V12 BLOOD GLUCOSE TEST test strip     . furosemide (LASIX) 40 MG tablet Take 1 tablet (40 mg total) by mouth 2 (two) times daily. 60 tablet 5  . gabapentin (NEURONTIN) 300 MG capsule Take one (1) capsule (300 mg total) by mouth each morning. Take two (2) capsules (600 mg total) by mouth each afternoon.    Marland Kitchen LITETOUCH LANCETS MISC     . Multiple Vitamin (MULTIVITAMIN WITH MINERALS) TABS tablet Take 1 tablet by mouth daily.    Vladimir Faster Glycol-Propyl Glycol (SYSTANE OP) Place 1 drop into both eyes 2 (two) times daily.    . potassium chloride SA (K-DUR,KLOR-CON) 20 MEQ tablet  TAKE ONE TABLET EVERY DAY 90 tablet 3  . Probiotic Product (PHILLIPS COLON HEALTH PO) Take 1 capsule by mouth daily.    Marland Kitchen PROCTOZONE-HC 2.5 % rectal cream Place 1 application rectally 2 (two) times daily as needed.     . sertraline (ZOLOFT) 100 MG tablet Take 100 mg by mouth daily.     . traZODone (DESYREL) 50 MG tablet Take 150 mg by mouth at bedtime.      No current facility-administered medications for this visit.     Allergies  Allergen Reactions  . Banana Anaphylaxis  . Penicillins Rash    Has patient had a PCN reaction causing immediate rash, facial/tongue/throat swelling, SOB or lightheadedness with hypotension: Unknown Has patient had a PCN reaction causing severe rash involving mucus membranes or skin necrosis: Unknown Has patient had a PCN reaction that required hospitalization: No Has patient had a PCN reaction occurring within the last 10 years: No If all of the above answers are "NO", then may proceed with Cephalosporin use.     Review of Systems negative except from HPI and PMH  Physical Exam BP 130/66   Pulse 60   Ht 5\' 3"  (1.6 m)   Wt 122 lb 3.2 oz (55.4 kg)   SpO2 99%   BMI 21.65 kg/m  Well developed and nourished in no acute distress HENT normal Neck supple with JVP-flat Clear Regular rate and rhythm, no murmurs or gallops Abd-soft with active BS No Clubbing cyanosis edema Skin-warm and dry A & Oriented  Grossly normal sensory and motor function    ECG demonstrates  P-synchronous/ AV  pacing    Assessment and  Plan    Aortic valve replacement  Statin myalgias  Complete heart block  Pacemaker -Medtronic  The patient's device was interrogated.  The information was reviewed. No changes were made in the programming.     HFpEF  We discussed statins.  I could not find data as to its utility post valve replacement in the absence of coronary disease.  However, DB  was trying to resume statins, we have agreed to try 40 mg 2 times per week  His  volume status is stable with a little bit of edema.  He will continue his diuretic regime as scheduled   We spent more than 50% of our >25 min visit in face to face counseling regarding the above

## 2018-06-26 ENCOUNTER — Encounter (HOSPITAL_COMMUNITY)
Admission: RE | Admit: 2018-06-26 | Discharge: 2018-06-26 | Disposition: A | Discharge status: Home / Self Care | Payer: Self-pay | Source: Ambulatory Visit | Attending: Internal Medicine | Admitting: Internal Medicine

## 2018-06-27 ENCOUNTER — Telehealth (HOSPITAL_COMMUNITY): Payer: Self-pay | Admitting: *Deleted

## 2018-06-27 NOTE — Telephone Encounter (Signed)
Notes recorded by Jolaine Artist, MD on 06/25/2018 at 8:03 AM EDT Looks good   Pt is aware of echo results

## 2018-06-28 ENCOUNTER — Encounter (HOSPITAL_COMMUNITY)
Admission: RE | Admit: 2018-06-28 | Discharge: 2018-06-28 | Disposition: A | Discharge status: Home / Self Care | Payer: Self-pay | Source: Ambulatory Visit | Attending: Internal Medicine | Admitting: Internal Medicine

## 2018-06-28 DIAGNOSIS — Z48812 Encounter for surgical aftercare following surgery on the circulatory system: Secondary | ICD-10-CM | POA: Insufficient documentation

## 2018-06-28 DIAGNOSIS — Z952 Presence of prosthetic heart valve: Secondary | ICD-10-CM | POA: Insufficient documentation

## 2018-06-28 DIAGNOSIS — I509 Heart failure, unspecified: Secondary | ICD-10-CM | POA: Insufficient documentation

## 2018-07-01 ENCOUNTER — Encounter (HOSPITAL_COMMUNITY): Payer: Self-pay

## 2018-07-03 ENCOUNTER — Encounter (HOSPITAL_COMMUNITY)
Admission: RE | Admit: 2018-07-03 | Discharge: 2018-07-03 | Disposition: A | Discharge status: Home / Self Care | Payer: Self-pay | Source: Ambulatory Visit | Attending: Internal Medicine | Admitting: Internal Medicine

## 2018-07-03 LAB — CUP PACEART INCLINIC DEVICE CHECK
Battery Impedance: 323 Ohm
Battery Remaining Longevity: 110 mo
Brady Statistic AP VS Percent: 0 %
Brady Statistic AS VS Percent: 0 %
Date Time Interrogation Session: 20191028143228
Implantable Lead Implant Date: 20140625
Implantable Lead Model: 5076
Implantable Pulse Generator Implant Date: 20140625
Lead Channel Impedance Value: 481 Ohm
Lead Channel Pacing Threshold Amplitude: 0.5 V
Lead Channel Pacing Threshold Amplitude: 0.5 V
Lead Channel Pacing Threshold Amplitude: 0.625 V
Lead Channel Pacing Threshold Pulse Width: 0.4 ms
Lead Channel Pacing Threshold Pulse Width: 0.4 ms
Lead Channel Sensing Intrinsic Amplitude: 4 mV
Lead Channel Setting Pacing Amplitude: 2 V
Lead Channel Setting Pacing Pulse Width: 0.4 ms
MDC IDC LEAD IMPLANT DT: 20140625
MDC IDC LEAD LOCATION: 753859
MDC IDC LEAD LOCATION: 753860
MDC IDC MSMT BATTERY VOLTAGE: 2.79 V
MDC IDC MSMT LEADCHNL RA PACING THRESHOLD PULSEWIDTH: 0.4 ms
MDC IDC MSMT LEADCHNL RA PACING THRESHOLD PULSEWIDTH: 0.4 ms
MDC IDC MSMT LEADCHNL RV IMPEDANCE VALUE: 472 Ohm
MDC IDC MSMT LEADCHNL RV PACING THRESHOLD AMPLITUDE: 0.5 V
MDC IDC SET LEADCHNL RA PACING AMPLITUDE: 1.5 V
MDC IDC SET LEADCHNL RV SENSING SENSITIVITY: 2 mV
MDC IDC STAT BRADY AP VP PERCENT: 24 %
MDC IDC STAT BRADY AS VP PERCENT: 76 %

## 2018-07-05 ENCOUNTER — Encounter (HOSPITAL_COMMUNITY)
Admission: RE | Admit: 2018-07-05 | Discharge: 2018-07-05 | Disposition: A | Discharge status: Home / Self Care | Payer: Medicare Other | Source: Ambulatory Visit | Attending: Internal Medicine | Admitting: Internal Medicine

## 2018-07-08 ENCOUNTER — Encounter (HOSPITAL_COMMUNITY): Payer: Self-pay

## 2018-07-10 ENCOUNTER — Encounter (HOSPITAL_COMMUNITY): Payer: Self-pay

## 2018-07-12 ENCOUNTER — Encounter (HOSPITAL_COMMUNITY): Payer: Self-pay

## 2018-07-12 LAB — CUP PACEART REMOTE DEVICE CHECK
Battery Impedance: 323 Ohm
Battery Voltage: 2.79 V
Brady Statistic AS VS Percent: 0 %
Date Time Interrogation Session: 20191024132258
Implantable Lead Implant Date: 20140625
Implantable Lead Location: 753860
Implantable Lead Model: 5076
Implantable Lead Model: 5076
Lead Channel Impedance Value: 468 Ohm
Lead Channel Impedance Value: 481 Ohm
Lead Channel Pacing Threshold Pulse Width: 0.4 ms
Lead Channel Setting Pacing Amplitude: 1.5 V
Lead Channel Setting Pacing Amplitude: 2 V
Lead Channel Setting Sensing Sensitivity: 2 mV
MDC IDC LEAD IMPLANT DT: 20140625
MDC IDC LEAD LOCATION: 753859
MDC IDC MSMT BATTERY REMAINING LONGEVITY: 110 mo
MDC IDC MSMT LEADCHNL RA PACING THRESHOLD AMPLITUDE: 0.625 V
MDC IDC MSMT LEADCHNL RA PACING THRESHOLD PULSEWIDTH: 0.4 ms
MDC IDC MSMT LEADCHNL RA SENSING INTR AMPL: 2.8 mV
MDC IDC MSMT LEADCHNL RV PACING THRESHOLD AMPLITUDE: 0.625 V
MDC IDC PG IMPLANT DT: 20140625
MDC IDC SET LEADCHNL RV PACING PULSEWIDTH: 0.4 ms
MDC IDC STAT BRADY AP VP PERCENT: 24 %
MDC IDC STAT BRADY AP VS PERCENT: 0 %
MDC IDC STAT BRADY AS VP PERCENT: 76 %

## 2018-07-15 ENCOUNTER — Encounter (HOSPITAL_COMMUNITY)
Admission: RE | Admit: 2018-07-15 | Discharge: 2018-07-15 | Disposition: A | Discharge status: Home / Self Care | Payer: Medicare Other | Source: Ambulatory Visit | Attending: Internal Medicine | Admitting: Internal Medicine

## 2018-07-17 ENCOUNTER — Encounter (HOSPITAL_COMMUNITY)
Admission: RE | Admit: 2018-07-17 | Discharge: 2018-07-17 | Disposition: A | Discharge status: Home / Self Care | Payer: Self-pay | Source: Ambulatory Visit | Attending: Internal Medicine | Admitting: Internal Medicine

## 2018-07-19 ENCOUNTER — Encounter (HOSPITAL_COMMUNITY): Payer: Self-pay

## 2018-07-19 ENCOUNTER — Other Ambulatory Visit: Payer: Self-pay | Admitting: Internal Medicine

## 2018-07-19 DIAGNOSIS — K6389 Other specified diseases of intestine: Secondary | ICD-10-CM

## 2018-07-22 ENCOUNTER — Encounter (HOSPITAL_COMMUNITY): Payer: Self-pay

## 2018-07-24 ENCOUNTER — Encounter (HOSPITAL_COMMUNITY)
Admission: RE | Admit: 2018-07-24 | Discharge: 2018-07-24 | Disposition: A | Discharge status: Home / Self Care | Payer: Medicare Other | Source: Ambulatory Visit | Attending: Internal Medicine | Admitting: Internal Medicine

## 2018-07-29 ENCOUNTER — Encounter (HOSPITAL_COMMUNITY)
Admission: RE | Admit: 2018-07-29 | Discharge: 2018-07-29 | Disposition: A | Discharge status: Home / Self Care | Payer: Self-pay | Source: Ambulatory Visit | Attending: Internal Medicine | Admitting: Internal Medicine

## 2018-07-29 DIAGNOSIS — I509 Heart failure, unspecified: Secondary | ICD-10-CM | POA: Insufficient documentation

## 2018-07-29 DIAGNOSIS — Z48812 Encounter for surgical aftercare following surgery on the circulatory system: Secondary | ICD-10-CM | POA: Insufficient documentation

## 2018-07-29 DIAGNOSIS — Z952 Presence of prosthetic heart valve: Secondary | ICD-10-CM | POA: Insufficient documentation

## 2018-07-31 ENCOUNTER — Encounter (HOSPITAL_COMMUNITY)
Admission: RE | Admit: 2018-07-31 | Discharge: 2018-07-31 | Disposition: A | Discharge status: Home / Self Care | Payer: Self-pay | Source: Ambulatory Visit | Attending: Internal Medicine | Admitting: Internal Medicine

## 2018-08-02 ENCOUNTER — Encounter (HOSPITAL_COMMUNITY)
Admission: RE | Admit: 2018-08-02 | Discharge: 2018-08-02 | Disposition: A | Discharge status: Home / Self Care | Payer: Self-pay | Source: Ambulatory Visit | Attending: Internal Medicine | Admitting: Internal Medicine

## 2018-08-05 ENCOUNTER — Encounter (HOSPITAL_COMMUNITY): Payer: Self-pay

## 2018-08-06 ENCOUNTER — Ambulatory Visit
Admission: RE | Admit: 2018-08-06 | Discharge: 2018-08-06 | Disposition: A | Discharge status: Home / Self Care | Payer: Medicare Other | Source: Ambulatory Visit | Attending: Internal Medicine | Admitting: Internal Medicine

## 2018-08-06 DIAGNOSIS — C481 Malignant neoplasm of specified parts of peritoneum: Secondary | ICD-10-CM | POA: Diagnosis not present

## 2018-08-06 DIAGNOSIS — K6389 Other specified diseases of intestine: Secondary | ICD-10-CM

## 2018-08-06 DIAGNOSIS — N200 Calculus of kidney: Secondary | ICD-10-CM | POA: Diagnosis not present

## 2018-08-06 MED ORDER — IOPAMIDOL (ISOVUE-300) INJECTION 61%
100.0000 mL | Freq: Once | INTRAVENOUS | Status: AC | PRN
  Administered 2018-08-06: 100 mL via INTRAVENOUS

## 2018-08-07 ENCOUNTER — Encounter (HOSPITAL_COMMUNITY)
Admission: RE | Admit: 2018-08-07 | Discharge: 2018-08-07 | Disposition: A | Discharge status: Home / Self Care | Payer: Self-pay | Source: Ambulatory Visit | Attending: Internal Medicine | Admitting: Internal Medicine

## 2018-08-09 ENCOUNTER — Encounter (HOSPITAL_COMMUNITY): Payer: Self-pay

## 2018-08-12 ENCOUNTER — Encounter (HOSPITAL_COMMUNITY): Payer: Self-pay

## 2018-08-13 DIAGNOSIS — K6389 Other specified diseases of intestine: Secondary | ICD-10-CM | POA: Diagnosis not present

## 2018-08-14 ENCOUNTER — Encounter (HOSPITAL_COMMUNITY)
Admission: RE | Admit: 2018-08-14 | Discharge: 2018-08-14 | Disposition: A | Discharge status: Home / Self Care | Payer: Self-pay | Source: Ambulatory Visit | Attending: Internal Medicine | Admitting: Internal Medicine

## 2018-08-16 ENCOUNTER — Encounter (HOSPITAL_COMMUNITY)
Admission: RE | Admit: 2018-08-16 | Discharge: 2018-08-16 | Disposition: A | Discharge status: Home / Self Care | Payer: Self-pay | Source: Ambulatory Visit | Attending: Internal Medicine | Admitting: Internal Medicine

## 2018-08-16 DIAGNOSIS — E1122 Type 2 diabetes mellitus with diabetic chronic kidney disease: Secondary | ICD-10-CM | POA: Diagnosis not present

## 2018-08-16 DIAGNOSIS — E782 Mixed hyperlipidemia: Secondary | ICD-10-CM | POA: Diagnosis not present

## 2018-08-19 ENCOUNTER — Encounter (HOSPITAL_COMMUNITY)
Admission: RE | Admit: 2018-08-19 | Discharge: 2018-08-19 | Disposition: A | Discharge status: Home / Self Care | Payer: Self-pay | Source: Ambulatory Visit | Attending: Internal Medicine | Admitting: Internal Medicine

## 2018-08-23 ENCOUNTER — Encounter (HOSPITAL_COMMUNITY)
Admission: RE | Admit: 2018-08-23 | Discharge: 2018-08-23 | Disposition: A | Discharge status: Home / Self Care | Payer: Medicare Other | Source: Ambulatory Visit | Attending: Internal Medicine | Admitting: Internal Medicine

## 2018-08-26 ENCOUNTER — Encounter (HOSPITAL_COMMUNITY)
Admission: RE | Admit: 2018-08-26 | Discharge: 2018-08-26 | Disposition: A | Discharge status: Home / Self Care | Payer: Self-pay | Source: Ambulatory Visit | Attending: Internal Medicine | Admitting: Internal Medicine

## 2018-08-30 ENCOUNTER — Encounter (HOSPITAL_COMMUNITY)
Admission: RE | Admit: 2018-08-30 | Discharge: 2018-08-30 | Disposition: A | Discharge status: Home / Self Care | Payer: Self-pay | Source: Ambulatory Visit | Attending: Internal Medicine | Admitting: Internal Medicine

## 2018-08-30 DIAGNOSIS — Z952 Presence of prosthetic heart valve: Secondary | ICD-10-CM | POA: Insufficient documentation

## 2018-08-30 DIAGNOSIS — Z48812 Encounter for surgical aftercare following surgery on the circulatory system: Secondary | ICD-10-CM | POA: Insufficient documentation

## 2018-08-30 DIAGNOSIS — I509 Heart failure, unspecified: Secondary | ICD-10-CM | POA: Insufficient documentation

## 2018-09-02 ENCOUNTER — Encounter (HOSPITAL_COMMUNITY)
Admission: RE | Admit: 2018-09-02 | Discharge: 2018-09-02 | Disposition: A | Discharge status: Home / Self Care | Payer: Self-pay | Source: Ambulatory Visit | Attending: Internal Medicine | Admitting: Internal Medicine

## 2018-09-04 ENCOUNTER — Encounter (HOSPITAL_COMMUNITY): Payer: Self-pay

## 2018-09-06 ENCOUNTER — Encounter (HOSPITAL_COMMUNITY)
Admission: RE | Admit: 2018-09-06 | Discharge: 2018-09-06 | Disposition: A | Discharge status: Home / Self Care | Payer: Self-pay | Source: Ambulatory Visit | Attending: Internal Medicine | Admitting: Internal Medicine

## 2018-09-09 ENCOUNTER — Encounter (HOSPITAL_COMMUNITY)
Admission: RE | Admit: 2018-09-09 | Discharge: 2018-09-09 | Disposition: A | Discharge status: Home / Self Care | Payer: Medicare Other | Source: Ambulatory Visit | Attending: Internal Medicine | Admitting: Internal Medicine

## 2018-09-11 ENCOUNTER — Encounter (HOSPITAL_COMMUNITY)
Admission: RE | Admit: 2018-09-11 | Discharge: 2018-09-11 | Disposition: A | Discharge status: Home / Self Care | Payer: Self-pay | Source: Ambulatory Visit | Attending: Internal Medicine | Admitting: Internal Medicine

## 2018-09-13 ENCOUNTER — Encounter (HOSPITAL_COMMUNITY)
Admission: RE | Admit: 2018-09-13 | Discharge: 2018-09-13 | Disposition: A | Discharge status: Home / Self Care | Payer: Self-pay | Source: Ambulatory Visit | Attending: Internal Medicine | Admitting: Internal Medicine

## 2018-09-16 ENCOUNTER — Encounter (HOSPITAL_COMMUNITY)
Admission: RE | Admit: 2018-09-16 | Discharge: 2018-09-16 | Disposition: A | Discharge status: Home / Self Care | Payer: Self-pay | Source: Ambulatory Visit | Attending: Internal Medicine | Admitting: Internal Medicine

## 2018-09-18 ENCOUNTER — Encounter (HOSPITAL_COMMUNITY)
Admission: RE | Admit: 2018-09-18 | Discharge: 2018-09-18 | Disposition: A | Discharge status: Home / Self Care | Payer: Self-pay | Source: Ambulatory Visit | Attending: Internal Medicine | Admitting: Internal Medicine

## 2018-09-19 ENCOUNTER — Ambulatory Visit (INDEPENDENT_AMBULATORY_CARE_PROVIDER_SITE_OTHER): Payer: Medicare Other

## 2018-09-19 DIAGNOSIS — I442 Atrioventricular block, complete: Secondary | ICD-10-CM | POA: Diagnosis not present

## 2018-09-20 ENCOUNTER — Encounter: Payer: Self-pay | Admitting: Cardiology

## 2018-09-20 ENCOUNTER — Encounter (HOSPITAL_COMMUNITY): Payer: Self-pay

## 2018-09-20 NOTE — Progress Notes (Signed)
Remote pacemaker transmission.   

## 2018-09-22 LAB — CUP PACEART REMOTE DEVICE CHECK
Battery Impedance: 347 Ohm
Battery Remaining Longevity: 107 mo
Battery Voltage: 2.79 V
Brady Statistic AP VP Percent: 29 %
Brady Statistic AP VS Percent: 0 %
Brady Statistic AS VP Percent: 71 %
Brady Statistic AS VS Percent: 0 %
Date Time Interrogation Session: 20200123165154
Implantable Lead Implant Date: 20140625
Implantable Lead Implant Date: 20140625
Implantable Lead Location: 753859
Implantable Lead Location: 753860
Implantable Lead Model: 5076
Implantable Lead Model: 5076
Lead Channel Impedance Value: 462 Ohm
Lead Channel Impedance Value: 474 Ohm
Lead Channel Pacing Threshold Amplitude: 0.5 V
Lead Channel Pacing Threshold Amplitude: 0.625 V
Lead Channel Pacing Threshold Pulse Width: 0.4 ms
Lead Channel Pacing Threshold Pulse Width: 0.4 ms
Lead Channel Setting Pacing Amplitude: 2 V
Lead Channel Setting Pacing Pulse Width: 0.4 ms
MDC IDC PG IMPLANT DT: 20140625
MDC IDC SET LEADCHNL RA PACING AMPLITUDE: 1.5 V
MDC IDC SET LEADCHNL RV SENSING SENSITIVITY: 2 mV

## 2018-09-23 ENCOUNTER — Encounter (HOSPITAL_COMMUNITY)
Admission: RE | Admit: 2018-09-23 | Discharge: 2018-09-23 | Disposition: A | Discharge status: Home / Self Care | Payer: Self-pay | Source: Ambulatory Visit | Attending: Internal Medicine | Admitting: Internal Medicine

## 2018-09-25 ENCOUNTER — Encounter (HOSPITAL_COMMUNITY): Payer: Self-pay

## 2018-09-27 ENCOUNTER — Encounter (HOSPITAL_COMMUNITY): Payer: Self-pay

## 2018-09-27 DIAGNOSIS — H4321 Crystalline deposits in vitreous body, right eye: Secondary | ICD-10-CM | POA: Diagnosis not present

## 2018-09-27 DIAGNOSIS — H524 Presbyopia: Secondary | ICD-10-CM | POA: Diagnosis not present

## 2018-09-27 DIAGNOSIS — E119 Type 2 diabetes mellitus without complications: Secondary | ICD-10-CM | POA: Diagnosis not present

## 2018-09-27 DIAGNOSIS — H52203 Unspecified astigmatism, bilateral: Secondary | ICD-10-CM | POA: Diagnosis not present

## 2018-09-27 DIAGNOSIS — H5203 Hypermetropia, bilateral: Secondary | ICD-10-CM | POA: Diagnosis not present

## 2018-09-27 DIAGNOSIS — H2513 Age-related nuclear cataract, bilateral: Secondary | ICD-10-CM | POA: Diagnosis not present

## 2018-09-30 ENCOUNTER — Encounter (HOSPITAL_COMMUNITY)
Admission: RE | Admit: 2018-09-30 | Discharge: 2018-09-30 | Disposition: A | Discharge status: Home / Self Care | Payer: Self-pay | Source: Ambulatory Visit | Attending: Internal Medicine | Admitting: Internal Medicine

## 2018-09-30 DIAGNOSIS — I509 Heart failure, unspecified: Secondary | ICD-10-CM | POA: Insufficient documentation

## 2018-09-30 DIAGNOSIS — Z952 Presence of prosthetic heart valve: Secondary | ICD-10-CM | POA: Insufficient documentation

## 2018-09-30 DIAGNOSIS — Z48812 Encounter for surgical aftercare following surgery on the circulatory system: Secondary | ICD-10-CM | POA: Insufficient documentation

## 2018-10-02 ENCOUNTER — Encounter (HOSPITAL_COMMUNITY)
Admission: RE | Admit: 2018-10-02 | Discharge: 2018-10-02 | Disposition: A | Discharge status: Home / Self Care | Payer: Self-pay | Source: Ambulatory Visit | Attending: Internal Medicine | Admitting: Internal Medicine

## 2018-10-04 ENCOUNTER — Encounter (HOSPITAL_COMMUNITY)
Admission: RE | Admit: 2018-10-04 | Discharge: 2018-10-04 | Disposition: A | Discharge status: Home / Self Care | Payer: Self-pay | Source: Ambulatory Visit | Attending: Internal Medicine | Admitting: Internal Medicine

## 2018-10-07 ENCOUNTER — Encounter (HOSPITAL_COMMUNITY)
Admission: RE | Admit: 2018-10-07 | Discharge: 2018-10-07 | Disposition: A | Discharge status: Home / Self Care | Payer: Medicare Other | Source: Ambulatory Visit | Attending: Internal Medicine | Admitting: Internal Medicine

## 2018-10-09 ENCOUNTER — Encounter (HOSPITAL_COMMUNITY)
Admission: RE | Admit: 2018-10-09 | Discharge: 2018-10-09 | Disposition: A | Discharge status: Home / Self Care | Payer: Self-pay | Source: Ambulatory Visit | Attending: Internal Medicine | Admitting: Internal Medicine

## 2018-10-11 ENCOUNTER — Encounter (HOSPITAL_COMMUNITY): Payer: Self-pay

## 2018-10-14 ENCOUNTER — Encounter (HOSPITAL_COMMUNITY): Payer: Self-pay

## 2018-10-16 ENCOUNTER — Encounter (HOSPITAL_COMMUNITY)
Admission: RE | Admit: 2018-10-16 | Discharge: 2018-10-16 | Disposition: A | Discharge status: Home / Self Care | Payer: Self-pay | Source: Ambulatory Visit | Attending: Internal Medicine | Admitting: Internal Medicine

## 2018-10-18 ENCOUNTER — Encounter (HOSPITAL_COMMUNITY): Payer: Self-pay

## 2018-10-21 ENCOUNTER — Encounter (HOSPITAL_COMMUNITY): Payer: Self-pay

## 2018-10-23 ENCOUNTER — Encounter (HOSPITAL_COMMUNITY)
Admission: RE | Admit: 2018-10-23 | Discharge: 2018-10-23 | Disposition: A | Discharge status: Home / Self Care | Payer: Self-pay | Source: Ambulatory Visit | Attending: Internal Medicine | Admitting: Internal Medicine

## 2018-10-25 ENCOUNTER — Encounter (HOSPITAL_COMMUNITY): Payer: Self-pay

## 2018-10-28 ENCOUNTER — Encounter (HOSPITAL_COMMUNITY): Payer: Self-pay

## 2018-10-28 DIAGNOSIS — I509 Heart failure, unspecified: Secondary | ICD-10-CM | POA: Insufficient documentation

## 2018-10-30 ENCOUNTER — Encounter (HOSPITAL_COMMUNITY)
Admission: RE | Admit: 2018-10-30 | Discharge: 2018-10-30 | Disposition: A | Discharge status: Home / Self Care | Payer: Self-pay | Source: Ambulatory Visit | Attending: Internal Medicine | Admitting: Internal Medicine

## 2018-11-01 ENCOUNTER — Encounter (HOSPITAL_COMMUNITY)
Admission: RE | Admit: 2018-11-01 | Discharge: 2018-11-01 | Disposition: A | Discharge status: Home / Self Care | Payer: Self-pay | Source: Ambulatory Visit | Attending: Internal Medicine | Admitting: Internal Medicine

## 2018-11-04 ENCOUNTER — Encounter (HOSPITAL_COMMUNITY): Payer: Self-pay

## 2018-11-06 ENCOUNTER — Encounter (HOSPITAL_COMMUNITY)
Admission: RE | Admit: 2018-11-06 | Discharge: 2018-11-06 | Disposition: A | Discharge status: Home / Self Care | Payer: Self-pay | Source: Ambulatory Visit | Attending: Internal Medicine | Admitting: Internal Medicine

## 2018-11-06 ENCOUNTER — Other Ambulatory Visit: Payer: Self-pay

## 2018-11-08 ENCOUNTER — Other Ambulatory Visit: Payer: Self-pay

## 2018-11-08 ENCOUNTER — Encounter (HOSPITAL_COMMUNITY)
Admission: RE | Admit: 2018-11-08 | Discharge: 2018-11-08 | Disposition: A | Discharge status: Home / Self Care | Payer: Self-pay | Source: Ambulatory Visit | Attending: Internal Medicine | Admitting: Internal Medicine

## 2018-11-11 ENCOUNTER — Encounter (HOSPITAL_COMMUNITY): Payer: Self-pay

## 2018-11-11 ENCOUNTER — Telehealth (HOSPITAL_COMMUNITY): Payer: Self-pay | Admitting: *Deleted

## 2018-11-11 NOTE — Telephone Encounter (Signed)
Contacted patient to notify of Cardiac Rehab department closure x2 weeks. Message left on patient's voicemail. Akim Watkinson, Exercise Physiologist Cardiac and Pulmonary Rehabilitaiton 

## 2018-11-13 ENCOUNTER — Encounter (HOSPITAL_COMMUNITY): Payer: Self-pay

## 2018-11-15 ENCOUNTER — Encounter (HOSPITAL_COMMUNITY): Payer: Self-pay

## 2018-11-18 ENCOUNTER — Telehealth (HOSPITAL_COMMUNITY): Payer: Self-pay | Admitting: Family Medicine

## 2018-11-18 ENCOUNTER — Encounter (HOSPITAL_COMMUNITY): Payer: Self-pay

## 2018-11-20 ENCOUNTER — Encounter (HOSPITAL_COMMUNITY): Payer: Self-pay

## 2018-11-22 ENCOUNTER — Encounter (HOSPITAL_COMMUNITY): Payer: Self-pay

## 2018-11-25 ENCOUNTER — Encounter (HOSPITAL_COMMUNITY): Payer: Self-pay

## 2018-11-27 ENCOUNTER — Encounter (HOSPITAL_COMMUNITY): Payer: Self-pay

## 2018-11-29 ENCOUNTER — Encounter (HOSPITAL_COMMUNITY): Payer: Self-pay

## 2018-12-02 ENCOUNTER — Encounter (HOSPITAL_COMMUNITY): Payer: Self-pay

## 2018-12-04 ENCOUNTER — Encounter (HOSPITAL_COMMUNITY): Payer: Self-pay

## 2018-12-05 ENCOUNTER — Telehealth (HOSPITAL_COMMUNITY): Payer: Self-pay | Admitting: Cardiac Rehabilitation

## 2018-12-05 NOTE — Telephone Encounter (Signed)
Pt phone call to inform of continued Outpatient Cardiac Rehab departmental closure for COVID 19 precautions. Future opening date to be determined. Pt instructed to continue exercising on his own following home exercise guidelines.Pt advised to contact cardiology or PCP PRN symptoms, questions or concerns. Left message on voicemail.  Koriana Stepien, RN, BSN Cardiac Pulmonary Rehab 

## 2018-12-06 ENCOUNTER — Encounter (HOSPITAL_COMMUNITY): Payer: Self-pay

## 2018-12-09 ENCOUNTER — Encounter (HOSPITAL_COMMUNITY): Payer: Self-pay

## 2018-12-11 ENCOUNTER — Encounter (HOSPITAL_COMMUNITY): Payer: Self-pay

## 2018-12-13 ENCOUNTER — Encounter (HOSPITAL_COMMUNITY): Payer: Self-pay

## 2018-12-16 ENCOUNTER — Encounter (HOSPITAL_COMMUNITY): Payer: Self-pay

## 2018-12-18 ENCOUNTER — Encounter (HOSPITAL_COMMUNITY): Payer: Self-pay

## 2018-12-19 ENCOUNTER — Other Ambulatory Visit: Payer: Self-pay

## 2018-12-19 ENCOUNTER — Ambulatory Visit (INDEPENDENT_AMBULATORY_CARE_PROVIDER_SITE_OTHER): Payer: Medicare Other | Admitting: *Deleted

## 2018-12-19 DIAGNOSIS — I442 Atrioventricular block, complete: Secondary | ICD-10-CM | POA: Diagnosis not present

## 2018-12-19 LAB — CUP PACEART REMOTE DEVICE CHECK
Battery Impedance: 396 Ohm
Battery Remaining Longevity: 103 mo
Battery Voltage: 2.79 V
Brady Statistic AP VP Percent: 30 %
Brady Statistic AP VS Percent: 0 %
Brady Statistic AS VP Percent: 70 %
Brady Statistic AS VS Percent: 0 %
Date Time Interrogation Session: 20200423134804
Implantable Lead Implant Date: 20140625
Implantable Lead Implant Date: 20140625
Implantable Lead Location: 753859
Implantable Lead Location: 753860
Implantable Lead Model: 5076
Implantable Lead Model: 5076
Implantable Pulse Generator Implant Date: 20140625
Lead Channel Impedance Value: 490 Ohm
Lead Channel Impedance Value: 502 Ohm
Lead Channel Pacing Threshold Amplitude: 0.5 V
Lead Channel Pacing Threshold Amplitude: 0.625 V
Lead Channel Pacing Threshold Pulse Width: 0.4 ms
Lead Channel Pacing Threshold Pulse Width: 0.4 ms
Lead Channel Sensing Intrinsic Amplitude: 2.8 mV
Lead Channel Setting Pacing Amplitude: 1.5 V
Lead Channel Setting Pacing Amplitude: 2 V
Lead Channel Setting Pacing Pulse Width: 0.4 ms
Lead Channel Setting Sensing Sensitivity: 2 mV

## 2018-12-20 ENCOUNTER — Encounter (HOSPITAL_COMMUNITY): Payer: Self-pay

## 2018-12-23 ENCOUNTER — Other Ambulatory Visit (HOSPITAL_COMMUNITY): Payer: Self-pay | Admitting: Internal Medicine

## 2018-12-23 ENCOUNTER — Encounter (HOSPITAL_COMMUNITY): Payer: Self-pay

## 2018-12-23 DIAGNOSIS — C9191 Lymphoid leukemia, unspecified, in remission: Secondary | ICD-10-CM | POA: Diagnosis not present

## 2018-12-23 DIAGNOSIS — E1122 Type 2 diabetes mellitus with diabetic chronic kidney disease: Secondary | ICD-10-CM | POA: Diagnosis not present

## 2018-12-23 DIAGNOSIS — Z79899 Other long term (current) drug therapy: Secondary | ICD-10-CM | POA: Diagnosis not present

## 2018-12-23 DIAGNOSIS — E782 Mixed hyperlipidemia: Secondary | ICD-10-CM | POA: Diagnosis not present

## 2018-12-25 ENCOUNTER — Encounter (HOSPITAL_COMMUNITY): Payer: Self-pay

## 2018-12-26 DIAGNOSIS — Z0000 Encounter for general adult medical examination without abnormal findings: Secondary | ICD-10-CM | POA: Diagnosis not present

## 2018-12-26 DIAGNOSIS — C4442 Squamous cell carcinoma of skin of scalp and neck: Secondary | ICD-10-CM | POA: Diagnosis not present

## 2018-12-26 DIAGNOSIS — Z8547 Personal history of malignant neoplasm of testis: Secondary | ICD-10-CM | POA: Diagnosis not present

## 2018-12-26 DIAGNOSIS — D497 Neoplasm of unspecified behavior of endocrine glands and other parts of nervous system: Secondary | ICD-10-CM | POA: Diagnosis not present

## 2018-12-26 DIAGNOSIS — E1122 Type 2 diabetes mellitus with diabetic chronic kidney disease: Secondary | ICD-10-CM | POA: Diagnosis not present

## 2018-12-26 DIAGNOSIS — I350 Nonrheumatic aortic (valve) stenosis: Secondary | ICD-10-CM | POA: Diagnosis not present

## 2018-12-26 DIAGNOSIS — N182 Chronic kidney disease, stage 2 (mild): Secondary | ICD-10-CM | POA: Diagnosis not present

## 2018-12-26 DIAGNOSIS — C4491 Basal cell carcinoma of skin, unspecified: Secondary | ICD-10-CM | POA: Diagnosis not present

## 2018-12-26 DIAGNOSIS — C9191 Lymphoid leukemia, unspecified, in remission: Secondary | ICD-10-CM | POA: Diagnosis not present

## 2018-12-26 DIAGNOSIS — N400 Enlarged prostate without lower urinary tract symptoms: Secondary | ICD-10-CM | POA: Diagnosis not present

## 2018-12-26 DIAGNOSIS — Z953 Presence of xenogenic heart valve: Secondary | ICD-10-CM | POA: Diagnosis not present

## 2018-12-26 DIAGNOSIS — I253 Aneurysm of heart: Secondary | ICD-10-CM | POA: Diagnosis not present

## 2018-12-27 ENCOUNTER — Encounter: Payer: Self-pay | Admitting: Cardiology

## 2018-12-27 ENCOUNTER — Encounter (HOSPITAL_COMMUNITY): Payer: Self-pay

## 2018-12-27 NOTE — Progress Notes (Signed)
Remote pacemaker transmission.   

## 2018-12-30 ENCOUNTER — Encounter (HOSPITAL_COMMUNITY): Payer: Self-pay

## 2019-01-01 ENCOUNTER — Encounter (HOSPITAL_COMMUNITY): Payer: Self-pay

## 2019-01-03 ENCOUNTER — Encounter (HOSPITAL_COMMUNITY): Payer: Self-pay

## 2019-01-06 ENCOUNTER — Encounter (HOSPITAL_COMMUNITY): Payer: Self-pay

## 2019-01-08 ENCOUNTER — Encounter (HOSPITAL_COMMUNITY): Payer: Self-pay

## 2019-01-10 ENCOUNTER — Encounter (HOSPITAL_COMMUNITY): Payer: Self-pay

## 2019-01-13 ENCOUNTER — Encounter (HOSPITAL_COMMUNITY): Payer: Self-pay

## 2019-01-15 ENCOUNTER — Encounter (HOSPITAL_COMMUNITY): Payer: Self-pay

## 2019-01-17 ENCOUNTER — Encounter (HOSPITAL_COMMUNITY): Payer: Self-pay

## 2019-01-22 ENCOUNTER — Encounter (HOSPITAL_COMMUNITY): Payer: Self-pay

## 2019-01-24 ENCOUNTER — Encounter (HOSPITAL_COMMUNITY): Payer: Self-pay

## 2019-01-27 ENCOUNTER — Encounter (HOSPITAL_COMMUNITY): Payer: Self-pay

## 2019-01-29 ENCOUNTER — Encounter (HOSPITAL_COMMUNITY): Payer: Self-pay

## 2019-01-31 ENCOUNTER — Encounter (HOSPITAL_COMMUNITY): Payer: Self-pay

## 2019-02-03 ENCOUNTER — Encounter (HOSPITAL_COMMUNITY): Payer: Self-pay

## 2019-02-05 ENCOUNTER — Encounter (HOSPITAL_COMMUNITY): Payer: Self-pay

## 2019-02-07 ENCOUNTER — Encounter (HOSPITAL_COMMUNITY): Payer: Self-pay

## 2019-02-10 ENCOUNTER — Encounter (HOSPITAL_COMMUNITY): Payer: Self-pay

## 2019-02-12 ENCOUNTER — Encounter (HOSPITAL_COMMUNITY): Payer: Self-pay

## 2019-02-14 ENCOUNTER — Encounter (HOSPITAL_COMMUNITY): Payer: Self-pay

## 2019-02-17 ENCOUNTER — Encounter (HOSPITAL_COMMUNITY): Payer: Self-pay

## 2019-02-19 ENCOUNTER — Encounter (HOSPITAL_COMMUNITY): Payer: Self-pay

## 2019-02-21 ENCOUNTER — Encounter (HOSPITAL_COMMUNITY): Payer: Self-pay

## 2019-02-24 ENCOUNTER — Encounter (HOSPITAL_COMMUNITY): Payer: Self-pay

## 2019-02-25 ENCOUNTER — Telehealth (HOSPITAL_COMMUNITY): Payer: Self-pay | Admitting: *Deleted

## 2019-02-25 NOTE — Telephone Encounter (Signed)
Called to update patient on the status of the maintenance Cardiac Rehab exercise classes.  They remain on hold at this time due to COVID-19 pandemic precautions.

## 2019-02-26 ENCOUNTER — Encounter (HOSPITAL_COMMUNITY): Payer: Self-pay

## 2019-03-03 ENCOUNTER — Encounter (HOSPITAL_COMMUNITY): Payer: Self-pay

## 2019-03-05 ENCOUNTER — Encounter (HOSPITAL_COMMUNITY): Payer: Self-pay

## 2019-03-07 ENCOUNTER — Encounter (HOSPITAL_COMMUNITY): Payer: Self-pay

## 2019-03-10 ENCOUNTER — Encounter (HOSPITAL_COMMUNITY): Payer: Self-pay

## 2019-03-12 ENCOUNTER — Encounter (HOSPITAL_COMMUNITY): Payer: Self-pay

## 2019-03-14 ENCOUNTER — Encounter (HOSPITAL_COMMUNITY): Payer: Self-pay

## 2019-03-17 ENCOUNTER — Encounter (HOSPITAL_COMMUNITY): Payer: Self-pay

## 2019-03-19 ENCOUNTER — Encounter (HOSPITAL_COMMUNITY): Payer: Self-pay

## 2019-03-20 ENCOUNTER — Ambulatory Visit (INDEPENDENT_AMBULATORY_CARE_PROVIDER_SITE_OTHER): Payer: Medicare Other | Admitting: *Deleted

## 2019-03-20 DIAGNOSIS — I5032 Chronic diastolic (congestive) heart failure: Secondary | ICD-10-CM

## 2019-03-20 DIAGNOSIS — I442 Atrioventricular block, complete: Secondary | ICD-10-CM | POA: Diagnosis not present

## 2019-03-21 ENCOUNTER — Encounter (HOSPITAL_COMMUNITY): Payer: Self-pay

## 2019-03-21 LAB — CUP PACEART REMOTE DEVICE CHECK
Battery Impedance: 396 Ohm
Battery Remaining Longevity: 103 mo
Battery Voltage: 2.79 V
Brady Statistic AP VP Percent: 31 %
Brady Statistic AP VS Percent: 0 %
Brady Statistic AS VP Percent: 69 %
Brady Statistic AS VS Percent: 0 %
Date Time Interrogation Session: 20200723192312
Implantable Lead Implant Date: 20140625
Implantable Lead Implant Date: 20140625
Implantable Lead Location: 753859
Implantable Lead Location: 753860
Implantable Lead Model: 5076
Implantable Lead Model: 5076
Implantable Pulse Generator Implant Date: 20140625
Lead Channel Impedance Value: 481 Ohm
Lead Channel Impedance Value: 488 Ohm
Lead Channel Pacing Threshold Amplitude: 0.5 V
Lead Channel Pacing Threshold Amplitude: 0.625 V
Lead Channel Pacing Threshold Pulse Width: 0.4 ms
Lead Channel Pacing Threshold Pulse Width: 0.4 ms
Lead Channel Setting Pacing Amplitude: 1.5 V
Lead Channel Setting Pacing Amplitude: 2 V
Lead Channel Setting Pacing Pulse Width: 0.4 ms
Lead Channel Setting Sensing Sensitivity: 2 mV

## 2019-03-24 ENCOUNTER — Encounter (HOSPITAL_COMMUNITY): Payer: Self-pay

## 2019-03-26 ENCOUNTER — Encounter (HOSPITAL_COMMUNITY): Payer: Self-pay

## 2019-03-28 ENCOUNTER — Encounter (HOSPITAL_COMMUNITY): Payer: Self-pay

## 2019-03-31 ENCOUNTER — Encounter (HOSPITAL_COMMUNITY): Payer: Self-pay

## 2019-04-01 NOTE — Progress Notes (Signed)
Remote pacemaker transmission.   

## 2019-04-02 ENCOUNTER — Encounter (HOSPITAL_COMMUNITY): Payer: Self-pay

## 2019-04-04 ENCOUNTER — Encounter (HOSPITAL_COMMUNITY): Payer: Self-pay

## 2019-04-07 ENCOUNTER — Encounter (HOSPITAL_COMMUNITY): Payer: Self-pay

## 2019-04-09 ENCOUNTER — Encounter (HOSPITAL_COMMUNITY): Payer: Self-pay

## 2019-04-11 ENCOUNTER — Encounter (HOSPITAL_COMMUNITY): Payer: Self-pay

## 2019-04-14 ENCOUNTER — Encounter (HOSPITAL_COMMUNITY): Payer: Self-pay

## 2019-04-16 ENCOUNTER — Encounter (HOSPITAL_COMMUNITY): Payer: Self-pay

## 2019-04-18 ENCOUNTER — Encounter (HOSPITAL_COMMUNITY): Payer: Self-pay

## 2019-04-21 ENCOUNTER — Encounter (HOSPITAL_COMMUNITY): Payer: Self-pay

## 2019-04-23 ENCOUNTER — Encounter (HOSPITAL_COMMUNITY): Payer: Self-pay

## 2019-04-25 ENCOUNTER — Encounter (HOSPITAL_COMMUNITY): Payer: Self-pay

## 2019-04-28 ENCOUNTER — Encounter (HOSPITAL_COMMUNITY): Payer: Self-pay

## 2019-04-29 DIAGNOSIS — N400 Enlarged prostate without lower urinary tract symptoms: Secondary | ICD-10-CM | POA: Diagnosis not present

## 2019-04-29 DIAGNOSIS — R159 Full incontinence of feces: Secondary | ICD-10-CM | POA: Diagnosis not present

## 2019-04-29 DIAGNOSIS — Z8547 Personal history of malignant neoplasm of testis: Secondary | ICD-10-CM | POA: Diagnosis not present

## 2019-04-29 DIAGNOSIS — Z86010 Personal history of colonic polyps: Secondary | ICD-10-CM | POA: Diagnosis not present

## 2019-04-29 DIAGNOSIS — C9110 Chronic lymphocytic leukemia of B-cell type not having achieved remission: Secondary | ICD-10-CM | POA: Diagnosis not present

## 2019-04-29 DIAGNOSIS — K620 Anal polyp: Secondary | ICD-10-CM | POA: Diagnosis not present

## 2019-04-29 DIAGNOSIS — D497 Neoplasm of unspecified behavior of endocrine glands and other parts of nervous system: Secondary | ICD-10-CM | POA: Diagnosis not present

## 2019-04-29 DIAGNOSIS — E1122 Type 2 diabetes mellitus with diabetic chronic kidney disease: Secondary | ICD-10-CM | POA: Diagnosis not present

## 2019-05-22 DIAGNOSIS — Z23 Encounter for immunization: Secondary | ICD-10-CM | POA: Diagnosis not present

## 2019-05-29 DIAGNOSIS — D2261 Melanocytic nevi of right upper limb, including shoulder: Secondary | ICD-10-CM | POA: Diagnosis not present

## 2019-05-29 DIAGNOSIS — L821 Other seborrheic keratosis: Secondary | ICD-10-CM | POA: Diagnosis not present

## 2019-05-29 DIAGNOSIS — L570 Actinic keratosis: Secondary | ICD-10-CM | POA: Diagnosis not present

## 2019-05-29 DIAGNOSIS — D2262 Melanocytic nevi of left upper limb, including shoulder: Secondary | ICD-10-CM | POA: Diagnosis not present

## 2019-05-29 DIAGNOSIS — Z85828 Personal history of other malignant neoplasm of skin: Secondary | ICD-10-CM | POA: Diagnosis not present

## 2019-05-29 DIAGNOSIS — D1801 Hemangioma of skin and subcutaneous tissue: Secondary | ICD-10-CM | POA: Diagnosis not present

## 2019-05-29 DIAGNOSIS — D225 Melanocytic nevi of trunk: Secondary | ICD-10-CM | POA: Diagnosis not present

## 2019-06-20 ENCOUNTER — Ambulatory Visit (INDEPENDENT_AMBULATORY_CARE_PROVIDER_SITE_OTHER): Payer: Medicare Other | Admitting: *Deleted

## 2019-06-20 ENCOUNTER — Other Ambulatory Visit: Payer: Self-pay

## 2019-06-20 DIAGNOSIS — I5032 Chronic diastolic (congestive) heart failure: Secondary | ICD-10-CM

## 2019-06-20 DIAGNOSIS — Z20822 Contact with and (suspected) exposure to covid-19: Secondary | ICD-10-CM

## 2019-06-20 DIAGNOSIS — I442 Atrioventricular block, complete: Secondary | ICD-10-CM | POA: Diagnosis not present

## 2019-06-20 DIAGNOSIS — Z20828 Contact with and (suspected) exposure to other viral communicable diseases: Secondary | ICD-10-CM | POA: Diagnosis not present

## 2019-06-21 ENCOUNTER — Other Ambulatory Visit (HOSPITAL_COMMUNITY): Payer: Self-pay | Admitting: Internal Medicine

## 2019-06-21 LAB — NOVEL CORONAVIRUS, NAA: SARS-CoV-2, NAA: NOT DETECTED

## 2019-06-22 LAB — CUP PACEART REMOTE DEVICE CHECK
Battery Impedance: 469 Ohm
Battery Remaining Longevity: 96 mo
Battery Voltage: 2.79 V
Brady Statistic AP VP Percent: 32 %
Brady Statistic AP VS Percent: 0 %
Brady Statistic AS VP Percent: 68 %
Brady Statistic AS VS Percent: 0 %
Date Time Interrogation Session: 20201023130200
Implantable Lead Implant Date: 20140625
Implantable Lead Implant Date: 20140625
Implantable Lead Location: 753859
Implantable Lead Location: 753860
Implantable Lead Model: 5076
Implantable Lead Model: 5076
Implantable Pulse Generator Implant Date: 20140625
Lead Channel Impedance Value: 488 Ohm
Lead Channel Impedance Value: 497 Ohm
Lead Channel Pacing Threshold Amplitude: 0.5 V
Lead Channel Pacing Threshold Amplitude: 0.625 V
Lead Channel Pacing Threshold Pulse Width: 0.4 ms
Lead Channel Pacing Threshold Pulse Width: 0.4 ms
Lead Channel Setting Pacing Amplitude: 1.5 V
Lead Channel Setting Pacing Amplitude: 2 V
Lead Channel Setting Pacing Pulse Width: 0.4 ms
Lead Channel Setting Sensing Sensitivity: 2 mV

## 2019-06-22 NOTE — Progress Notes (Signed)
Cardiology Office Note Date:  06/25/2019  Patient ID:  [name], [name] Jan 08, 1941, MRN [id number] PCP:  Jonathon Jordan, MD  Cardiologist:  Dr. Haroldine Laws Electrophysiologist: Dr. Caryl Comes     Chief Complaint:  annual visit  History of Present Illness: [name] is a 78 y.o. male with history of VHD w/bioprosthetic AVR (2014,  with ligation of diagonal-PA fistula by Dr. 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 Leamon Arnt (doing well for some years).  He comes in today to be seen for Dr. Caryl Comes, last seen by him Oct 2019.  At that time he was doing well, planned to try low dose statin twice weekly to see if he could tolerate this.  He is doing well.  Unfortunately his regular exercise program interrupted with COVID, unable to go to his program.  He recently purchased a recumbent bike with arm exercises as well.  This has been a slow start but looks forward to getting back to his usual exercise regime/practice.   No CP, palpitations or SOB.  NO dizzy spells, near syncope or syncope. No symptoms of PND or orthopnea He is tolerating the current statin, reports his PMD is monitoring labs/lipids.    Device information: MDT dual chamber PPM, implanted 02/19/13   Past Medical History:  Diagnosis Date  . Aortic stenosis    s/p AVR 6/14  . Asthma   . Atrioventricular block, complete ([place])    a. s/p MDT dual chamber PPM 2014  . Chronic lymphocytic leukemia ([place])    followed by Dr. Ouida Sills  . Depression   . DM2 (diabetes mellitus, type 2) (H[facility])    fasting blood sugar - 90s  . Exposure to secondhand smoke    extensive exposure  . Hyperlipidemia    treated  . LBBB (left bundle branch block)   . Testicular cancer [facility])    s/p XRT and surgical resection.    Past Surgical History:  Procedure Laterality Date  . AORTIC  VALVE REPLACEMENT N/A 02/17/2013   Procedure: AORTIC VALVE REPLACEMENT (AVR);  Surgeon: Gaye Pollack, MD;  Location: [place];  Service: Open Heart Surgery;  Laterality: N/A;  . CYST REMOVAL NECK    . INNER EAR SURGERY    . INTRAOPERATIVE TRANSESOPHAGEAL ECHOCARDIOGRAM N/A 02/17/2013   Procedure: INTRAOPERATIVE TRANSESOPHAGEAL ECHOCARDIOGRAM;  Surgeon: Gaye Pollack, MD;  Location[facility] OR;  Service: Open Heart Surgery;  Laterality: N/A;  . LEFT AND RIGHT HEART CATHETERIZATION WITH CORONARY ANGIOGRAM N/A 12/25/2012   Procedure: LEFT AND RIGHT HEART CATHETERIZATION WITH CORONARY ANGIOGRAM;  Surgeon: Jolaine Artist, MD;  Location: [facility] CATH LAB;  Service: Cardiovascular;  Laterality: N/A;  . PERMANENT PACEMAKER INSERTION N/A 02/19/2013   Procedure: PERMANENT PACEMAKER INSERTION;  Surgeon: Deboraha Sprang, MD;  Location: [facility] CATH LAB;  Service: Cardiovascular;  Laterality: N/A;  . RECTAL BIOPSY N/A 10/08/2017   Procedure: EXCISION OF ANAL POLYP ;  Surgeon: Ileana Roup, MD;  Location[facility];  Service: General;  Laterality: N/A;  . stapendectomy  07/30/02   right  . XRT and surgical resection     s/p. 10 years ago    Current Outpatient Medications  Medication Sig Dispense Refill  . acetaminophen (TYLENOL) 500 MG tablet Take 500 mg by mouth 3 (three) times daily.     Marland Kitchen aspirin 81 MG tablet Take 1 tablet (81 mg total) by mouth daily. [place]  tablet 11  . atorvastatin (LIPITOR) 40 MG tablet Take 1 tablet (40 mg total) by mouth 2 (two) times a week. 25 tablet 3  . busPIRone (BUSPAR) 15 MG tablet Take 15-30 mg by mouth See admin instructions. One tablet by mouth every morning and two tablets by mouth every day at noon    . Cholecalciferol (VITAMIN D) 2000 units tablet Take 2,000 Units by mouth every other day.     . cholestyramine light (PREVALITE) 4 GM/DOSE powder Take 4 g by mouth 2 (two) times daily with a meal.    . cycloSPORINE (RESTASIS) 0.05 % ophthalmic emulsion Place 1 drop into both eyes 2  (two) times daily.     Marland Kitchen desloratadine (CLARINEX) 5 MG tablet Take 5 mg by mouth daily.     . fenofibrate 160 MG tablet Take 160 mg by mouth daily.     . fluticasone (FLONASE) 50 MCG/ACT nasal spray Place 2 sprays into the nose daily.     Marland Kitchen FORA V12 BLOOD GLUCOSE TEST test strip     . furosemide (LASIX) 40 MG tablet TAKE ONE TABLET TWICE DAILY 60 tablet 5  . gabapentin (NEURONTIN) 300 MG capsule Take one (1) capsule (300 mg total) by mouth each morning. Take two (2) capsules (600 mg total) by mouth each afternoon.    Marland Kitchen LITETOUCH LANCETS MISC     . Multiple Vitamin (MULTIVITAMIN WITH MINERALS) TABS tablet Take 1 tablet by mouth daily.    Vladimir Faster Glycol-Propyl Glycol (SYSTANE OP) Place 1 drop into both eyes 2 (two) times daily.    . potassium chloride SA (K-DUR,KLOR-CON) 20 MEQ tablet TAKE ONE TABLET EVERY DAY 90 tablet 3  . Probiotic Product (PHILLIPS COLON HEALTH PO) Take 1 capsule by mouth daily.    Marland Kitchen PROCTOZONE-HC 2.5 % rectal cream Place 1 application rectally 2 (two) times daily as needed.     . sertraline (ZOLOFT) 100 MG tablet Take 100 mg by mouth daily.     . traZODone (DESYREL) 50 MG tablet Take 150 mg by mouth at bedtime.      No current facility-administered medications for this visit.     Allergies:   Banana and Penicillins   Social History:  The patient  reports that he has never smoked. He has never used smokeless tobacco. He reports that he does not drink alcohol or use drugs.   Family History:  The patient's family history includes Brain cancer in his mother; Cancer in his father.  ROS:  Please see the history of present illness.  All other systems are reviewed and otherwise negative.   PHYSICAL EXAM:  VS:  BP 114/60   Pulse 60   Ht 5\' 3"  (1.6 m)   Wt 126 lb (57.2 kg)   SpO2 99%   BMI 22.32 kg/m  BMI: Body mass index is 22.32 kg/m. Well nourished, well developed, in no acute distress  HEENT: normocephalic, atraumatic  Neck: no JVD, carotid bruits or masses  Cardiac:  RRR; crisp valve sound noted, no significant murmurs, no rubs, or gallops Lungs:  CTA b/l, no wheezing, rhonchi or rales  Abd: soft, nontender MS: no deformity, age appropriate atrophy Ext:  no edema  Skin: warm and dry, no rash Neuro:  No gross deficits appreciated Psych: euthymic mood, full affect  PPM site is stable, no tethering or discomfort   EKG:  Done today and reviewed by myself shows:   AV paced PPM interrogation done today and reviewed by myself;  Battery and  lead measurements are good 3 AMS switches, longest 73min38 seconds, all are 1;1, no Afib   06/24/18: TTE Study Conclusions - Left ventricle: The cavity size was normal. Wall thickness was   normal. Systolic function was normal. The estimated ejection   fraction was in the range of 55% to 60%. There is hypokinesis of   the apical septal myocardium. Doppler parameters are consistent   with abnormal left ventricular relaxation (grade 1 diastolic   dysfunction). - Aortic valve: A bioprosthesis was present. Valve area (VTI): 1.85   cm^2. Valve area (Vmax): 1.73 cm^2. Valve area (Vmean): 1.68   cm^2. - Ascending aorta: The ascending aorta was mildly dilated. - Mitral valve: Calcified annulus. Mildly thickened leaflets . - Left atrium: The atrium was mildly dilated. - Right ventricle: The cavity size was mildly dilated. - Atrial septum: There was an atrial septal aneurysm.  Impressions: - Hypokinesis of the distal septum with overall normal LV systolic   function; mild diastolic dysfunction; mildly dilated ascending   aorta; s/p AVR with mean gradient 8 mmHg; mild LAE; mild RVE;   mild TR.     Recent Labs: No results found for requested labs within last 8760 hours.  No results found for requested labs within last 8760 hours.   CrCl cannot be calculated (Patient's most recent lab result is older than the maximum 21 days allowed.).   Wt Readings from Last 3 Encounters:  06/25/19 126 lb (57.2 kg)   06/24/18 122 lb 3.2 oz (55.4 kg)  06/14/18 121 lb 3.2 oz (55 kg)     Other studies reviewed: Additional studies/records reviewed today include: summarized above  ASSESSMENT AND PLAN:  1. PPM     Intact function, no rpogramming changes made  2. HTN     Looks good, no changes  3. VHD w/bioprosthetic AVR     No symptoms to suggest  Change     Echo from last year reviewed  4. HLD     Monitored/managed with his PMD     Tolerating current low dose statin  KPN 12/23/2018 HDL 60 LDL 89 Trigs 85 ALT 16     Disposition: F/u with Q 3 mo remotes and one year in clinic, sooner if needed.  He is planned to see Dr. Haroldine Laws in 6 mo.  Current medicines are reviewed at length with the patient today.  The patient did not have any concerns regarding medicines.  Venetia Night, PA-C 06/25/2019 11:58 AM     [facility] [place] [address] [phone] (office)  [phone] (fax)

## 2019-06-25 ENCOUNTER — Ambulatory Visit (INDEPENDENT_AMBULATORY_CARE_PROVIDER_SITE_OTHER): Payer: Medicare Other | Admitting: Physician Assistant

## 2019-06-25 ENCOUNTER — Encounter: Payer: Self-pay | Admitting: Physician Assistant

## 2019-06-25 ENCOUNTER — Other Ambulatory Visit: Payer: Self-pay

## 2019-06-25 VITALS — BP 114/60 | HR 60 | Ht 63.0 in | Wt 126.0 lb

## 2019-06-25 DIAGNOSIS — I5032 Chronic diastolic (congestive) heart failure: Secondary | ICD-10-CM | POA: Diagnosis not present

## 2019-06-25 DIAGNOSIS — Z950 Presence of cardiac pacemaker: Secondary | ICD-10-CM

## 2019-06-25 DIAGNOSIS — I10 Essential (primary) hypertension: Secondary | ICD-10-CM

## 2019-06-25 DIAGNOSIS — Z952 Presence of prosthetic heart valve: Secondary | ICD-10-CM

## 2019-06-25 DIAGNOSIS — E7849 Other hyperlipidemia: Secondary | ICD-10-CM | POA: Diagnosis not present

## 2019-06-25 DIAGNOSIS — I442 Atrioventricular block, complete: Secondary | ICD-10-CM | POA: Diagnosis not present

## 2019-06-25 NOTE — Patient Instructions (Signed)
Medication Instructions:   Your physician recommends that you continue on your current medications as directed. Please refer to the Current Medication list given to you today.  *If you need a refill on your cardiac medications before your next appointment, please call your pharmacy*  Lab Work:  None ordered today  Testing/Procedures:  None ordered today  Follow-Up: At [name], you and your health needs are our priority.  As part of our continuing mission to provide you with exceptional heart care, we have created designated Provider Care Teams.  These Care Teams include your primary Cardiologist (physician) and Advanced Practice Providers (APPs -  Physician Assistants and Nurse Practitioners) who all work together to provide you with the care you need, when you need it.  Your next appointment:   12 months  The format for your next appointment:   In Person  Provider:   You may see Virl Axe, MD or one of the following Advanced Practice Providers on your designated Care Team:    Chanetta Marshall, NP  Tommye Standard, PA-C  Legrand Como "[place]" [place], [place]

## 2019-06-30 NOTE — Progress Notes (Signed)
Remote pacemaker transmission.   

## 2019-07-03 DIAGNOSIS — Z85828 Personal history of other malignant neoplasm of skin: Secondary | ICD-10-CM | POA: Diagnosis not present

## 2019-07-03 DIAGNOSIS — D044 Carcinoma in situ of skin of scalp and neck: Secondary | ICD-10-CM | POA: Diagnosis not present

## 2019-07-03 DIAGNOSIS — L570 Actinic keratosis: Secondary | ICD-10-CM | POA: Diagnosis not present

## 2019-07-03 DIAGNOSIS — D485 Neoplasm of uncertain behavior of skin: Secondary | ICD-10-CM | POA: Diagnosis not present

## 2019-07-29 ENCOUNTER — Other Ambulatory Visit: Payer: Self-pay | Admitting: Internal Medicine

## 2019-07-29 DIAGNOSIS — K6389 Other specified diseases of intestine: Secondary | ICD-10-CM

## 2019-08-06 ENCOUNTER — Other Ambulatory Visit: Payer: Self-pay

## 2019-08-06 ENCOUNTER — Ambulatory Visit
Admission: RE | Admit: 2019-08-06 | Discharge: 2019-08-06 | Disposition: A | Discharge status: Home / Self Care | Payer: Medicare Other | Source: Ambulatory Visit | Attending: Internal Medicine | Admitting: Internal Medicine

## 2019-08-06 DIAGNOSIS — N200 Calculus of kidney: Secondary | ICD-10-CM | POA: Diagnosis not present

## 2019-08-06 DIAGNOSIS — R1907 Generalized intra-abdominal and pelvic swelling, mass and lump: Secondary | ICD-10-CM | POA: Diagnosis not present

## 2019-08-06 DIAGNOSIS — K5730 Diverticulosis of large intestine without perforation or abscess without bleeding: Secondary | ICD-10-CM | POA: Diagnosis not present

## 2019-08-06 DIAGNOSIS — K6389 Other specified diseases of intestine: Secondary | ICD-10-CM

## 2019-08-06 MED ORDER — IOPAMIDOL (ISOVUE-300) INJECTION 61%
100.0000 mL | Freq: Once | INTRAVENOUS | Status: AC | PRN
  Administered 2019-08-06: 100 mL via INTRAVENOUS

## 2019-09-07 ENCOUNTER — Ambulatory Visit: Payer: Medicare Other | Attending: Internal Medicine

## 2019-09-07 DIAGNOSIS — Z23 Encounter for immunization: Secondary | ICD-10-CM | POA: Diagnosis not present

## 2019-09-07 NOTE — Progress Notes (Signed)
   Covid-19 Vaccination Clinic  Name:  [name]    MRN: [id number] DOB: 01/14/1941  09/07/2019  Mr. Mikita was observed post Covid-19 immunization for 15 minutes without incidence. He was provided with Vaccine Information Sheet and instruction to access the V-Safe system.   Mr. Magness was instructed to call 911 with any severe reactions post vaccine: Marland Kitchen Difficulty breathing  . Swelling of your face and throat  . A fast heartbeat  . A bad rash all over your body  . Dizziness and weakness    Immunizations Administered    Name Date Dose VIS Date Route   Pfizer COVID-19 Vaccine 09/07/2019 12:03 PM 0.3 mL 08/08/2019 Intramuscular   Manufacturer: [place]   Lot: [id number]   [place]: [id number]

## 2019-09-18 DIAGNOSIS — K625 Hemorrhage of anus and rectum: Secondary | ICD-10-CM | POA: Diagnosis not present

## 2019-09-19 ENCOUNTER — Ambulatory Visit (INDEPENDENT_AMBULATORY_CARE_PROVIDER_SITE_OTHER): Payer: Medicare Other | Admitting: *Deleted

## 2019-09-19 DIAGNOSIS — I442 Atrioventricular block, complete: Secondary | ICD-10-CM

## 2019-09-19 LAB — CUP PACEART REMOTE DEVICE CHECK
Battery Impedance: 518 Ohm
Battery Remaining Longevity: 92 mo
Battery Voltage: 2.79 V
Brady Statistic AP VP Percent: 36 %
Brady Statistic AP VS Percent: 0 %
Brady Statistic AS VP Percent: 64 %
Brady Statistic AS VS Percent: 0 %
Date Time Interrogation Session: 20210122080307
Implantable Lead Implant Date: 20140625
Implantable Lead Implant Date: 20140625
Implantable Lead Location: 753859
Implantable Lead Location: 753860
Implantable Lead Model: 5076
Implantable Lead Model: 5076
Implantable Pulse Generator Implant Date: 20140625
Lead Channel Impedance Value: 481 Ohm
Lead Channel Impedance Value: 483 Ohm
Lead Channel Pacing Threshold Amplitude: 0.625 V
Lead Channel Pacing Threshold Amplitude: 0.625 V
Lead Channel Pacing Threshold Pulse Width: 0.4 ms
Lead Channel Pacing Threshold Pulse Width: 0.4 ms
Lead Channel Setting Pacing Amplitude: 1.5 V
Lead Channel Setting Pacing Amplitude: 2 V
Lead Channel Setting Pacing Pulse Width: 0.4 ms
Lead Channel Setting Sensing Sensitivity: 2 mV

## 2019-09-24 DIAGNOSIS — K625 Hemorrhage of anus and rectum: Secondary | ICD-10-CM | POA: Diagnosis not present

## 2019-09-24 DIAGNOSIS — K620 Anal polyp: Secondary | ICD-10-CM | POA: Diagnosis not present

## 2019-09-24 DIAGNOSIS — K6289 Other specified diseases of anus and rectum: Secondary | ICD-10-CM | POA: Diagnosis not present

## 2019-09-24 DIAGNOSIS — Z86010 Personal history of colonic polyps: Secondary | ICD-10-CM | POA: Diagnosis not present

## 2019-09-25 ENCOUNTER — Ambulatory Visit: Payer: Medicare Other

## 2019-09-25 DIAGNOSIS — Z1159 Encounter for screening for other viral diseases: Secondary | ICD-10-CM | POA: Diagnosis not present

## 2019-09-27 ENCOUNTER — Ambulatory Visit: Payer: Medicare Other | Attending: Internal Medicine

## 2019-09-27 DIAGNOSIS — Z23 Encounter for immunization: Secondary | ICD-10-CM | POA: Insufficient documentation

## 2019-09-27 NOTE — Progress Notes (Signed)
   Covid-19 Vaccination Clinic  Name:  [name]    MRN: [id number] DOB: 15-Jun-1941  09/27/2019  [name] was observed post Covid-19 immunization for 15 minutes without incidence. He was provided with Vaccine Information Sheet and instruction to access the V-Safe system.   [name] was instructed to call 911 with any severe reactions post vaccine: Marland Kitchen Difficulty breathing  . Swelling of your face and throat  . A fast heartbeat  . A bad rash all over your body  . Dizziness and weakness    Immunizations Administered    Name Date Dose VIS Date Route   Pfizer COVID-19 Vaccine 09/27/2019 12:19 PM 0.3 mL 08/08/2019 Intramuscular   Manufacturer: [place]   Lot: [id number]   [place]: [id number]

## 2019-09-30 DIAGNOSIS — K5730 Diverticulosis of large intestine without perforation or abscess without bleeding: Secondary | ICD-10-CM | POA: Diagnosis not present

## 2019-09-30 DIAGNOSIS — K625 Hemorrhage of anus and rectum: Secondary | ICD-10-CM | POA: Diagnosis not present

## 2019-09-30 DIAGNOSIS — D125 Benign neoplasm of sigmoid colon: Secondary | ICD-10-CM | POA: Diagnosis not present

## 2019-09-30 DIAGNOSIS — K648 Other hemorrhoids: Secondary | ICD-10-CM | POA: Diagnosis not present

## 2019-10-02 DIAGNOSIS — H4321 Crystalline deposits in vitreous body, right eye: Secondary | ICD-10-CM | POA: Diagnosis not present

## 2019-10-02 DIAGNOSIS — H25013 Cortical age-related cataract, bilateral: Secondary | ICD-10-CM | POA: Diagnosis not present

## 2019-10-02 DIAGNOSIS — H2513 Age-related nuclear cataract, bilateral: Secondary | ICD-10-CM | POA: Diagnosis not present

## 2019-10-02 DIAGNOSIS — E119 Type 2 diabetes mellitus without complications: Secondary | ICD-10-CM | POA: Diagnosis not present

## 2019-10-03 DIAGNOSIS — D125 Benign neoplasm of sigmoid colon: Secondary | ICD-10-CM | POA: Diagnosis not present

## 2019-12-19 ENCOUNTER — Ambulatory Visit (INDEPENDENT_AMBULATORY_CARE_PROVIDER_SITE_OTHER): Payer: Medicare Other | Admitting: *Deleted

## 2019-12-19 DIAGNOSIS — I442 Atrioventricular block, complete: Secondary | ICD-10-CM | POA: Diagnosis not present

## 2019-12-19 LAB — CUP PACEART REMOTE DEVICE CHECK
Battery Impedance: 592 Ohm
Battery Remaining Longevity: 86 mo
Battery Voltage: 2.79 V
Brady Statistic AP VP Percent: 35 %
Brady Statistic AP VS Percent: 0 %
Brady Statistic AS VP Percent: 65 %
Brady Statistic AS VS Percent: 0 %
Date Time Interrogation Session: 20210423090931
Implantable Lead Implant Date: 20140625
Implantable Lead Implant Date: 20140625
Implantable Lead Location: 753859
Implantable Lead Location: 753860
Implantable Lead Model: 5076
Implantable Lead Model: 5076
Implantable Pulse Generator Implant Date: 20140625
Lead Channel Impedance Value: 482 Ohm
Lead Channel Impedance Value: 488 Ohm
Lead Channel Pacing Threshold Amplitude: 0.625 V
Lead Channel Pacing Threshold Amplitude: 0.625 V
Lead Channel Pacing Threshold Pulse Width: 0.4 ms
Lead Channel Pacing Threshold Pulse Width: 0.4 ms
Lead Channel Setting Pacing Amplitude: 1.5 V
Lead Channel Setting Pacing Amplitude: 2 V
Lead Channel Setting Pacing Pulse Width: 0.4 ms
Lead Channel Setting Sensing Sensitivity: 2 mV

## 2019-12-19 NOTE — Progress Notes (Signed)
PPM Remote  

## 2020-01-01 DIAGNOSIS — Z8547 Personal history of malignant neoplasm of testis: Secondary | ICD-10-CM | POA: Diagnosis not present

## 2020-01-01 DIAGNOSIS — Z0000 Encounter for general adult medical examination without abnormal findings: Secondary | ICD-10-CM | POA: Diagnosis not present

## 2020-01-01 DIAGNOSIS — N182 Chronic kidney disease, stage 2 (mild): Secondary | ICD-10-CM | POA: Diagnosis not present

## 2020-01-01 DIAGNOSIS — Z8349 Family history of other endocrine, nutritional and metabolic diseases: Secondary | ICD-10-CM | POA: Diagnosis not present

## 2020-01-01 DIAGNOSIS — N400 Enlarged prostate without lower urinary tract symptoms: Secondary | ICD-10-CM | POA: Diagnosis not present

## 2020-01-01 DIAGNOSIS — R600 Localized edema: Secondary | ICD-10-CM | POA: Diagnosis not present

## 2020-01-01 DIAGNOSIS — M79606 Pain in leg, unspecified: Secondary | ICD-10-CM | POA: Diagnosis not present

## 2020-01-01 DIAGNOSIS — Z79899 Other long term (current) drug therapy: Secondary | ICD-10-CM | POA: Diagnosis not present

## 2020-01-01 DIAGNOSIS — E1122 Type 2 diabetes mellitus with diabetic chronic kidney disease: Secondary | ICD-10-CM | POA: Diagnosis not present

## 2020-01-01 DIAGNOSIS — K5730 Diverticulosis of large intestine without perforation or abscess without bleeding: Secondary | ICD-10-CM | POA: Diagnosis not present

## 2020-01-01 DIAGNOSIS — E782 Mixed hyperlipidemia: Secondary | ICD-10-CM | POA: Diagnosis not present

## 2020-01-01 DIAGNOSIS — C9191 Lymphoid leukemia, unspecified, in remission: Secondary | ICD-10-CM | POA: Diagnosis not present

## 2020-01-10 ENCOUNTER — Other Ambulatory Visit (HOSPITAL_COMMUNITY): Payer: Self-pay | Admitting: Internal Medicine

## 2020-01-13 ENCOUNTER — Other Ambulatory Visit (HOSPITAL_COMMUNITY): Payer: Self-pay | Admitting: Internal Medicine

## 2020-03-19 ENCOUNTER — Ambulatory Visit (INDEPENDENT_AMBULATORY_CARE_PROVIDER_SITE_OTHER): Payer: Medicare Other | Admitting: *Deleted

## 2020-03-19 DIAGNOSIS — I442 Atrioventricular block, complete: Secondary | ICD-10-CM | POA: Diagnosis not present

## 2020-03-20 LAB — CUP PACEART REMOTE DEVICE CHECK
Battery Impedance: 666 Ohm
Battery Remaining Longevity: 81 mo
Battery Voltage: 2.79 V
Brady Statistic AP VP Percent: 34 %
Brady Statistic AP VS Percent: 0 %
Brady Statistic AS VP Percent: 66 %
Brady Statistic AS VS Percent: 0 %
Date Time Interrogation Session: 20210723190955
Implantable Lead Implant Date: 20140625
Implantable Lead Implant Date: 20140625
Implantable Lead Location: 753859
Implantable Lead Location: 753860
Implantable Lead Model: 5076
Implantable Lead Model: 5076
Implantable Pulse Generator Implant Date: 20140625
Lead Channel Impedance Value: 474 Ohm
Lead Channel Impedance Value: 478 Ohm
Lead Channel Pacing Threshold Amplitude: 0.625 V
Lead Channel Pacing Threshold Amplitude: 0.625 V
Lead Channel Pacing Threshold Pulse Width: 0.4 ms
Lead Channel Pacing Threshold Pulse Width: 0.4 ms
Lead Channel Setting Pacing Amplitude: 1.5 V
Lead Channel Setting Pacing Amplitude: 2 V
Lead Channel Setting Pacing Pulse Width: 0.4 ms
Lead Channel Setting Sensing Sensitivity: 2 mV

## 2020-03-22 NOTE — Progress Notes (Signed)
Remote pacemaker transmission.   

## 2020-04-01 DIAGNOSIS — H9113 Presbycusis, bilateral: Secondary | ICD-10-CM | POA: Diagnosis not present

## 2020-04-01 DIAGNOSIS — H90A22 Sensorineural hearing loss, unilateral, left ear, with restricted hearing on the contralateral side: Secondary | ICD-10-CM | POA: Diagnosis not present

## 2020-04-01 DIAGNOSIS — H90A31 Mixed conductive and sensorineural hearing loss, unilateral, right ear with restricted hearing on the contralateral side: Secondary | ICD-10-CM | POA: Diagnosis not present

## 2020-05-15 DIAGNOSIS — Z23 Encounter for immunization: Secondary | ICD-10-CM | POA: Diagnosis not present

## 2020-05-30 DIAGNOSIS — Z23 Encounter for immunization: Secondary | ICD-10-CM | POA: Diagnosis not present

## 2020-06-18 ENCOUNTER — Ambulatory Visit (INDEPENDENT_AMBULATORY_CARE_PROVIDER_SITE_OTHER): Payer: Medicare Other

## 2020-06-18 DIAGNOSIS — I442 Atrioventricular block, complete: Secondary | ICD-10-CM

## 2020-06-19 LAB — CUP PACEART REMOTE DEVICE CHECK
Battery Impedance: 741 Ohm
Battery Remaining Longevity: 77 mo
Battery Voltage: 2.79 V
Brady Statistic AP VP Percent: 33 %
Brady Statistic AP VS Percent: 0 %
Brady Statistic AS VP Percent: 67 %
Brady Statistic AS VS Percent: 0 %
Date Time Interrogation Session: 20211022073427
Implantable Lead Implant Date: 20140625
Implantable Lead Implant Date: 20140625
Implantable Lead Location: 753859
Implantable Lead Location: 753860
Implantable Lead Model: 5076
Implantable Lead Model: 5076
Implantable Pulse Generator Implant Date: 20140625
Lead Channel Impedance Value: 457 Ohm
Lead Channel Impedance Value: 468 Ohm
Lead Channel Pacing Threshold Amplitude: 0.625 V
Lead Channel Pacing Threshold Amplitude: 0.75 V
Lead Channel Pacing Threshold Pulse Width: 0.4 ms
Lead Channel Pacing Threshold Pulse Width: 0.4 ms
Lead Channel Setting Pacing Amplitude: 1.5 V
Lead Channel Setting Pacing Amplitude: 2 V
Lead Channel Setting Pacing Pulse Width: 0.4 ms
Lead Channel Setting Sensing Sensitivity: 2 mV

## 2020-06-23 NOTE — Progress Notes (Signed)
Remote pacemaker transmission.   

## 2020-06-27 NOTE — Progress Notes (Signed)
Cardiology Office Note Date:  06/27/2020  Patient ID:  Tyler, Skinner August 28, 1941, MRN [id number] PCP:  Jonathon Jordan, MD  Cardiologist:  Dr. Haroldine Laws Electrophysiologist: Dr. Caryl Comes     Chief Complaint:    annual visit  History of Present Illness: [name] is a 79 y.o. male with history of VHD w/bioprosthetic AVR (2014,  with ligation of diagonal-PA fistula by Dr. 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 Leamon Arnt (doing well for some years).  He comes in today to be seen for Dr. Caryl Comes, last seen by him Oct 2019.  At that time he was doing well, planned to try low dose statin twice weekly to see if he could tolerate this.   I saw him Oct 2020 He is doing well.  Unfortunately his regular exercise program interrupted with COVID, unable to go to his program.  He recently purchased a recumbent bike with arm exercises as well.  This has been a slow start but looks forward to getting back to his usual exercise regime/practice.   No CP, palpitations or SOB.  NO dizzy spells, near syncope or syncope. No symptoms of PND or orthopnea He is tolerating the current statin, reports his PMD is monitoring labs/lipids.  No changes were made, due to see Dr. Haroldine Laws in 57mo, planned for remotes and annual EP/device visit  TODAY He continues to do well. 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 He and his wife just down-sized to an apartment and the complex has a gym that he is looking forward to trying. Never really got back to his regular exercise regime. Walks the dog Denies any changes in his exertional capacity or sense of well being No CP, palpitations or cardiac awareness No fainting, near fainting.  I notes ome trace edema R a bit more then left and he reports that as same for many  years. He reports his PMD does labs for him  He continues to see D. Morse at [facility] with stable findings.   KPN 01/01/20 Creat 1.230 K+ 4.4   Device information: MDT dual chamber PPM, implanted 02/19/13   Past Medical History:  Diagnosis Date  . Aortic stenosis    s/p AVR 6/14  . Asthma   . Atrioventricular block, complete ([place])    a. s/p MDT dual chamber PPM 2014  . Chronic lymphocytic leukemia ([place])    followed by Dr. Ouida Sills  . Depression   . DM2 (diabetes mellitus, type 2) (HCC)    fasting blood sugar - 90s  . Exposure to secondhand smoke    extensive exposure  . Hyperlipidemia    treated  . LBBB (left bundle branch block)   . Testicular cancer [facility])    s/p XRT and surgical resection.    Past Surgical History:  Procedure Laterality Date  . AORTIC VALVE REPLACEMENT N/A 02/17/2013   Procedure: AORTIC VALVE REPLACEMENT (AVR);  Surgeon: Gaye Pollack, MD;  Location: [place] [facility];  Service: Open Heart Surgery;  Laterality: N/A;  . CYST REMOVAL NECK    . INNER EAR SURGERY    . INTRAOPERATIVE TRANSESOPHAGEAL ECHOCARDIOGRAM N/A 02/17/2013   Procedure: INTRAOPERATIVE TRANSESOPHAGEAL ECHOCARDIOGRAM;  Surgeon: Gaye Pollack, MD;  Location: [facility] OR;  Service: Open Heart Surgery;  Laterality: N/A;  . LEFT AND RIGHT HEART CATHETERIZATION WITH CORONARY ANGIOGRAM N/A 12/25/2012   Procedure: LEFT  AND RIGHT HEART CATHETERIZATION WITH CORONARY ANGIOGRAM;  Surgeon: Jolaine Artist, MD;  Location: [facility] CATH LAB;  Service: Cardiovascular;  Laterality: N/A;  . PERMANENT PACEMAKER INSERTION N/A 02/19/2013   Procedure: PERMANENT PACEMAKER INSERTION;  Surgeon: Deboraha Sprang, MD;  Location: [facility] CATH LAB;  Service: Cardiovascular;  Laterality: N/A;  . RECTAL BIOPSY N/A 10/08/2017   Procedure: EXCISION OF ANAL POLYP ;  Surgeon: Ileana Roup, MD;  Location: [facility] ORS;  Service: General;  Laterality: N/A;  . stapendectomy  07/30/02   right  . XRT and surgical resection     s/p. 10 years ago     Current Outpatient Medications  Medication Sig Dispense Refill  . acetaminophen (TYLENOL) 500 MG tablet Take 500 mg by mouth 3 (three) times daily.     Marland Kitchen aspirin 81 MG tablet Take 1 tablet (81 mg total) by mouth daily. 30 tablet 11  . atorvastatin (LIPITOR) 40 MG tablet Take 1 tablet (40 mg total) by mouth 2 (two) times a week. 25 tablet 3  . busPIRone (BUSPAR) 15 MG tablet Take 15-30 mg by mouth See admin instructions. One tablet by mouth every morning and two tablets by mouth every day at noon    . Cholecalciferol (VITAMIN D) 2000 units tablet Take 2,000 Units by mouth every other day.     . cholestyramine light (PREVALITE) 4 GM/DOSE powder Take 4 g by mouth 2 (two) times daily with a meal.    . cycloSPORINE (RESTASIS) 0.05 % ophthalmic emulsion Place 1 drop into both eyes 2 (two) times daily.     Marland Kitchen desloratadine (CLARINEX) 5 MG tablet Take 5 mg by mouth daily.     . fenofibrate 160 MG tablet Take 160 mg by mouth daily.     . fluticasone (FLONASE) 50 MCG/ACT nasal spray Place 2 sprays into the nose daily.     Marland Kitchen FORA V12 BLOOD GLUCOSE TEST test strip     . furosemide (LASIX) 40 MG tablet TAKE ONE TABLET TWICE DAILY 60 tablet 5  . gabapentin (NEURONTIN) 300 MG capsule Take one (1) capsule (300 mg total) by mouth each morning. Take two (2) capsules (600 mg total) by mouth each afternoon.    Marland Kitchen LITETOUCH LANCETS MISC     . Multiple Vitamin (MULTIVITAMIN WITH MINERALS) TABS tablet Take 1 tablet by mouth daily.    Vladimir Faster Glycol-Propyl Glycol (SYSTANE OP) Place 1 drop into both eyes 2 (two) times daily.    . potassium chloride SA (K-DUR,KLOR-CON) 20 MEQ tablet TAKE ONE TABLET EVERY DAY 90 tablet 3  . Probiotic Product ([place] COLON HEALTH PO) Take 1 capsule by mouth daily.    Marland Kitchen PROCTOZONE-HC 2.5 % rectal cream Place 1 application rectally 2 (two) times daily as needed.     . sertraline (ZOLOFT) 100 MG tablet Take 100 mg by mouth daily.     . traZODone (DESYREL) 50 MG tablet Take 150 mg  by mouth at bedtime.      No current facility-administered medications for this visit.    Allergies:   Banana and Penicillins   Social History:  The patient  reports that he has never smoked. He has never used smokeless tobacco. He reports that he does not drink alcohol and does not use drugs.   Family History:  The patient's family history includes Brain cancer in his mother; Cancer in his father.  ROS:  Please see the history of present illness.  All other systems are reviewed and otherwise negative.  PHYSICAL EXAM:  VS:  There were no vitals taken for this visit. BMI: There is no height or weight on file to calculate BMI. Well nourished, well developed, in no acute distress  HEENT: normocephalic, atraumatic  Neck: no JVD, carotid bruits or masses Cardiac:  RRR; crisp valve sound noted, no significant murmurs, no rubs, or gallops Lungs:  CTA b/l, no wheezing, rhonchi or rales  Abd: soft, nontender MS: no deformity, age appropriate atrophy Ext:  trace edema, R>L Skin: warm and dry, no rash Neuro:  No gross deficits appreciated Psych: euthymic mood, full affect  PPM site is stable, no tethering or discomfort   EKG:  Done today and reviewed by myself shows:    AV paced  PPM interrogation done today and reviewed by myself;  Battery and lead measurements are good No AMS or HVR Pacer dependent at 40   06/24/18: TTE Study Conclusions - Left ventricle: The cavity size was normal. Wall thickness was   normal. Systolic function was normal. The estimated ejection   fraction was in the range of 55% to 60%. There is hypokinesis of   the apical septal myocardium. Doppler parameters are consistent   with abnormal left ventricular relaxation (grade 1 diastolic   dysfunction). - Aortic valve: A bioprosthesis was present. Valve area (VTI): 1.85   cm^2. Valve area (Vmax): 1.73 cm^2. Valve area (Vmean): 1.68   cm^2. - Ascending aorta: The ascending aorta was mildly dilated. -  Mitral valve: Calcified annulus. Mildly thickened leaflets . - Left atrium: The atrium was mildly dilated. - Right ventricle: The cavity size was mildly dilated. - Atrial septum: There was an atrial septal aneurysm.  Impressions: - Hypokinesis of the distal septum with overall normal LV systolic   function; mild diastolic dysfunction; mildly dilated ascending   aorta; s/p AVR with mean gradient 8 mmHg; mild LAE; mild RVE;   mild TR.     Recent Labs: No results found for requested labs within last 8760 hours.  No results found for requested labs within last 8760 hours.   CrCl cannot be calculated (Patient's most recent lab result is older than the maximum 21 days allowed.).   Wt Readings from Last 3 Encounters:  06/25/19 126 lb (57.2 kg)  06/24/18 122 lb 3.2 oz (55.4 kg)  06/14/18 121 lb 3.2 oz (55 kg)     Other studies reviewed: Additional studies/records reviewed today include: summarized above  ASSESSMENT AND PLAN:  1. PPM     Intact function, no rpogramming changes made  2. HTN     Looks good, no changes  3. VHD w/bioprosthetic AVR     No symptoms to suggest change     last echo noted above     Plan an echo next year  4. HLD     Monitored/managed with his PMD          Disposition: continue remotes and back in clinic in 1 year, sooner if needed.  Current medicines are reviewed at length with the patient today.  The patient did not have any concerns regarding medicines.  Venetia Night, PA-C 06/27/2020 8:20 PM     [address] [place] [address] [phone] (office)  [phone] (fax)

## 2020-06-29 ENCOUNTER — Other Ambulatory Visit: Payer: Self-pay

## 2020-06-29 ENCOUNTER — Encounter: Payer: Self-pay | Admitting: Physician Assistant

## 2020-06-29 ENCOUNTER — Ambulatory Visit (INDEPENDENT_AMBULATORY_CARE_PROVIDER_SITE_OTHER): Payer: Medicare Other | Admitting: Physician Assistant

## 2020-06-29 VITALS — BP 124/70 | HR 60 | Ht 62.0 in | Wt 121.8 lb

## 2020-06-29 DIAGNOSIS — Z950 Presence of cardiac pacemaker: Secondary | ICD-10-CM | POA: Diagnosis not present

## 2020-06-29 DIAGNOSIS — I5032 Chronic diastolic (congestive) heart failure: Secondary | ICD-10-CM | POA: Diagnosis not present

## 2020-06-29 DIAGNOSIS — I442 Atrioventricular block, complete: Secondary | ICD-10-CM | POA: Diagnosis not present

## 2020-06-29 DIAGNOSIS — I10 Essential (primary) hypertension: Secondary | ICD-10-CM | POA: Diagnosis not present

## 2020-06-29 DIAGNOSIS — Z952 Presence of prosthetic heart valve: Secondary | ICD-10-CM | POA: Diagnosis not present

## 2020-06-29 NOTE — Patient Instructions (Signed)
Medication Instructions:  Your physician recommends that you continue on your current medications as directed. Please refer to the Current Medication list given to you today.  *If you need a refill on your cardiac medications before your next appointment, please call your pharmacy*   Lab Work: NONE ORDERED  TODAY  If you have labs (blood work) drawn today and your tests are completely normal, you will receive your results only by: . MyChart Message (if you have MyChart) OR . A paper copy in the mail If you have any lab test that is abnormal or we need to change your treatment, we will call you to review the results.   Testing/Procedures: NONE ORDERED  TODAY   Follow-Up: At [facility], you and your health needs are our priority.  As part of our continuing mission to provide you with exceptional heart care, we have created designated Provider Care Teams.  These Care Teams include your primary Cardiologist (physician) and Advanced Practice Providers (APPs -  Physician Assistants and Nurse Practitioners) who all work together to provide you with the care you need, when you need it.  We recommend signing up for the patient portal called "MyChart".  Sign up information is provided on this After Visit Summary.  MyChart is used to connect with patients for Virtual Visits (Telemedicine).  Patients are able to view lab/test results, encounter notes, upcoming appointments, etc.  Non-urgent messages can be sent to your provider as well.   To learn more about what you can do with MyChart, go to [email or web address].    Your next appointment:   1 year(s)  The format for your next appointment:   In Person  Provider:   You may see Dr. Klein  or one of the following Advanced Practice Providers on your designated Care Team:    Amber Seiler, NP  Renee Ursuy, PA-C  Michael "Andy" Tillery, PA-C    Other Instructions   

## 2020-07-01 DIAGNOSIS — D2261 Melanocytic nevi of right upper limb, including shoulder: Secondary | ICD-10-CM | POA: Diagnosis not present

## 2020-07-01 DIAGNOSIS — D225 Melanocytic nevi of trunk: Secondary | ICD-10-CM | POA: Diagnosis not present

## 2020-07-01 DIAGNOSIS — L570 Actinic keratosis: Secondary | ICD-10-CM | POA: Diagnosis not present

## 2020-07-01 DIAGNOSIS — L821 Other seborrheic keratosis: Secondary | ICD-10-CM | POA: Diagnosis not present

## 2020-07-01 DIAGNOSIS — Z85828 Personal history of other malignant neoplasm of skin: Secondary | ICD-10-CM | POA: Diagnosis not present

## 2020-07-01 DIAGNOSIS — D1801 Hemangioma of skin and subcutaneous tissue: Secondary | ICD-10-CM | POA: Diagnosis not present

## 2020-07-01 DIAGNOSIS — D692 Other nonthrombocytopenic purpura: Secondary | ICD-10-CM | POA: Diagnosis not present

## 2020-07-01 DIAGNOSIS — D2262 Melanocytic nevi of left upper limb, including shoulder: Secondary | ICD-10-CM | POA: Diagnosis not present

## 2020-07-19 ENCOUNTER — Other Ambulatory Visit: Payer: Self-pay

## 2020-07-19 ENCOUNTER — Encounter (HOSPITAL_COMMUNITY): Payer: Self-pay | Admitting: Emergency Medicine

## 2020-07-19 ENCOUNTER — Emergency Department (HOSPITAL_COMMUNITY)
Admission: EM | Admit: 2020-07-19 | Discharge: 2020-07-19 | Disposition: A | Discharge status: Home / Self Care | Payer: Medicare Other | Attending: Emergency Medicine | Admitting: Emergency Medicine

## 2020-07-19 DIAGNOSIS — K623 Rectal prolapse: Secondary | ICD-10-CM

## 2020-07-19 DIAGNOSIS — E119 Type 2 diabetes mellitus without complications: Secondary | ICD-10-CM | POA: Insufficient documentation

## 2020-07-19 DIAGNOSIS — J45909 Unspecified asthma, uncomplicated: Secondary | ICD-10-CM | POA: Diagnosis not present

## 2020-07-19 DIAGNOSIS — Z7982 Long term (current) use of aspirin: Secondary | ICD-10-CM | POA: Diagnosis not present

## 2020-07-19 DIAGNOSIS — K649 Unspecified hemorrhoids: Secondary | ICD-10-CM | POA: Diagnosis present

## 2020-07-19 DIAGNOSIS — Z7952 Long term (current) use of systemic steroids: Secondary | ICD-10-CM | POA: Diagnosis not present

## 2020-07-19 LAB — CBG MONITORING, ED: Glucose-Capillary: 86 mg/dL (ref 70–99)

## 2020-07-19 MED ORDER — LIDOCAINE HCL URETHRAL/MUCOSAL 2 % EX GEL
1.0000 "application " | Freq: Once | CUTANEOUS | Status: AC
  Administered 2020-07-19: 1
  Filled 2020-07-19: qty 11

## 2020-07-19 NOTE — ED Notes (Signed)
Patient verbalizes understanding of discharge instructions. Opportunity for questioning and answers were provided. Armband removed by staff, pt discharged from ED ambulatory with family.  

## 2020-07-19 NOTE — ED Provider Notes (Signed)
[facility] EMERGENCY DEPARTMENT Provider Note  CSN: [id number] Arrival date & time: 07/19/20 1114    History Chief Complaint  Patient presents with  . Hemorrhoids    HPI  [name] is a 79 y.o. male presents for evaluation of rectal pain/hemorrhoids ongoing for several weeks, associated with bleeding after BM, worse when straining, described as 'golf-ball sized'. Per daughter at bedside, he had been minimizing his symptoms but he has had so much discomfort the last few days he has not been able to sit comfortably or work. He called to make an appointment with Surgery but soonest they could see him was in 3-4 weeks so he decided to come to the ED. 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 No fever, N/V. He reports sometimes his stool is loose and he feels like he has to strain to get it out so he has been taking something recent to 'bind him up'.    Past Medical History:  Diagnosis Date  . Aortic stenosis    s/p AVR 6/14  . Asthma   . Atrioventricular block, complete ([place])    a. s/p MDT dual chamber PPM 2014  . Chronic lymphocytic leukemia ([employer])    followed by Dr. Ouida Sills  . Depression   . DM2 (diabetes mellitus, type 2) (HCC)    fasting blood sugar - 90s  . Exposure to secondhand smoke    extensive exposure  . Hyperlipidemia    treated  . LBBB (left bundle branch block)   . Testicular cancer [facility])    s/p XRT and surgical resection.    Past Surgical History:  Procedure Laterality Date  . AORTIC VALVE REPLACEMENT N/A 02/17/2013   Procedure: AORTIC VALVE REPLACEMENT (AVR);  Surgeon: Gaye Pollack, MD;  Location: [place];  Service: Open Heart Surgery;  Laterality: N/A;  . CYST REMOVAL NECK    . INNER EAR SURGERY    . INTRAOPERATIVE TRANSESOPHAGEAL ECHOCARDIOGRAM N/A 02/17/2013   Procedure: INTRAOPERATIVE TRANSESOPHAGEAL ECHOCARDIOGRAM;  Surgeon: Gaye Pollack, MD;  Location:  [facility] OR;  Service: Open Heart Surgery;  Laterality: N/A;  . LEFT AND RIGHT HEART CATHETERIZATION WITH CORONARY ANGIOGRAM N/A 12/25/2012   Procedure: LEFT AND RIGHT HEART CATHETERIZATION WITH CORONARY ANGIOGRAM;  Surgeon: Jolaine Artist, MD;  Location: [facility] CATH LAB;  Service: Cardiovascular;  Laterality: N/A;  . PERMANENT PACEMAKER INSERTION N/A 02/19/2013   Procedure: PERMANENT PACEMAKER INSERTION;  Surgeon: Deboraha Sprang, MD;  Location: [facility] CATH LAB;  Service: Cardiovascular;  Laterality: N/A;  . RECTAL BIOPSY N/A 10/08/2017   Procedure: EXCISION OF ANAL POLYP ;  Surgeon: Ileana Roup, MD;  Location: [facility]S;  Service: General;  Laterality: N/A;  . stapendectomy  07/30/02   right  . XRT and surgical resection     s/p. 10 years ago    Family History  Problem Relation Age of Onset  . Brain cancer Mother   . Cancer Father        lung    Social History   Tobacco Use  . Smoking status: Never Smoker  . Smokeless tobacco: Never Used  Vaping Use  . Vaping Use: Never used  Substance Use Topics  . Alcohol use: No  . Drug use: No     Home Medications Prior to Admission medications   Medication Sig Start Date End Date Taking? Authorizing Provider  acetaminophen (TYLENOL) 500 MG tablet Take  500 mg by mouth 3 (three) times daily.     [provider]  aspirin 81 MG tablet Take 1 tablet (81 mg total) by mouth daily. 06/09/13   Deboraha Sprang, MD  atorvastatin (LIPITOR) 40 MG tablet Take 40 mg by mouth once a week.    [provider]  busPIRone (BUSPAR) 15 MG tablet Take 15-30 mg by mouth See admin instructions. One tablet by mouth every morning and two tablets by mouth every day at noon    [provider]  Cholecalciferol (VITAMIN D) 2000 units tablet Take 2,000 Units by mouth every other day.     [provider]  cholestyramine light (PREVALITE) 4 GM/DOSE powder Take 4 g by mouth 2 (two) times daily with a meal.    [provider]   cycloSPORINE (RESTASIS) 0.05 % ophthalmic emulsion Place 1 drop into both eyes 2 (two) times daily.     [provider]  fenofibrate 160 MG tablet Take 160 mg by mouth daily.     [provider]  fluticasone (FLONASE) 50 MCG/ACT nasal spray Place 2 sprays into the nose daily.     [provider]  FORA [phone] BLOOD GLUCOSE TEST test strip  07/31/12   [provider]  furosemide (LASIX) 40 MG tablet TAKE ONE TABLET TWICE DAILY 01/12/20   Bensimhon, Shaune Pascal, MD  gabapentin (NEURONTIN) 300 MG capsule Take one (1) capsule (300 mg total) by mouth each morning. Take two (2) capsules (600 mg total) by mouth each afternoon.    [provider]  [place]  07/31/12   [provider]  montelukast (SINGULAIR) 10 MG tablet Take 10 mg by mouth daily. 05/02/20   [provider]  Multiple Vitamin (MULTIVITAMIN WITH MINERALS) TABS tablet Take 1 tablet by mouth daily.    [provider]  Polyethyl Glycol-Propyl Glycol (SYSTANE OP) Place 1 drop into both eyes 2 (two) times daily.    [provider]  potassium chloride SA (K-DUR,KLOR-CON) 20 MEQ tablet TAKE ONE TABLET EVERY DAY 10/24/16   Bensimhon, Shaune Pascal, MD  Probiotic Product ([place] PO) Take 1 capsule by mouth daily.    [provider]  PROCTOZONE-HC 2.5 % rectal cream Place 1 application rectally 2 (two) times daily as needed.  07/24/12   [provider]  sertraline (ZOLOFT) 100 MG tablet Take 100 mg by mouth daily.     [provider]  traZODone (DESYREL) 50 MG tablet Take 150 mg by mouth at bedtime.     [provider]     Allergies    Banana and Penicillins   Review of Systems   Review of Systems A comprehensive review of systems was completed and negative except as noted in HPI.    Physical Exam BP (!) 153/54 (BP Location: Right Arm)   Pulse 64   Temp 98.2 F (36.8 C)   Resp 16   SpO2 99%   Physical  Exam Vitals and nursing note reviewed.  HENT:     Head: Normocephalic.     Nose: Nose normal.  Eyes:     Extraocular Movements: Extraocular movements intact.  Pulmonary:     Effort: Pulmonary effort is normal.  Abdominal:     General: Abdomen is flat. There is no distension.     Tenderness: There is no abdominal tenderness.  Genitourinary:    Comments: No external hemorrhoids, rectal tone decreased but present, friable anal verge but no sores or masses,  he has mild rectal prolapse with bearing down Musculoskeletal:        General: Normal range of motion.     Cervical back: Neck supple.  Skin:    Findings: No rash (on exposed skin).  Neurological:     Mental Status: He is alert and oriented to person, place, and time.  Psychiatric:        Mood and Affect: Mood normal.      ED Results / Procedures / Treatments   Labs (all labs ordered are listed, but only abnormal results are displayed) Labs Reviewed  CBG MONITORING, ED    EKG None  Radiology No results found.  Procedures Procedures  Medications Ordered in the ED Medications  lidocaine (XYLOCAINE) 2 % jelly 1 application (has no administration in time range)     MDM Rules/Calculators/A&P MDM Exam shows no hemorrhoids, mostly likely he is having intermittent rectal prolapse. Will discuss with Gen Surg to try to expedite follow up and for management recommendations.  ED Course  I have reviewed the triage vital signs and the nursing notes.  Pertinent labs & imaging results that were available during my care of the patient were reviewed by me and considered in my medical decision making (see chart for details).  Clinical Course as of Jul 19 2044  Mon Jul 19, 2020  2036 Spoke with Dr. Kieth Brightly, Gen Surgery who agrees with plan for expedited outpatient follow up. He recommends keeping stool soft, no straining, manually reduce if rectum comes out again. Lidocaine gel for comfort.    [CS]    Clinical Course User  Index [CS] Truddie Hidden, MD    Final Clinical Impression(s) / ED Diagnoses Final diagnoses:  Rectal prolapse    Rx / DC Orders ED Discharge Orders    None       Truddie Hidden, MD 07/19/20 2045

## 2020-07-19 NOTE — ED Triage Notes (Signed)
Pt reports large, golf ball sized hemorrhoid that has been present for some time, c/o bleeding and pain. Cannot see surgeon until December.

## 2020-07-19 NOTE — ED Notes (Signed)
Pt daughter states he is diabetic.  Prophylactic 4 oz orange juice crackers and cheese given, okay per Triage RN.

## 2020-07-26 ENCOUNTER — Other Ambulatory Visit (HOSPITAL_COMMUNITY): Payer: Self-pay | Admitting: Internal Medicine

## 2020-07-27 ENCOUNTER — Other Ambulatory Visit: Payer: Self-pay | Admitting: Internal Medicine

## 2020-07-27 DIAGNOSIS — K769 Liver disease, unspecified: Secondary | ICD-10-CM

## 2020-07-27 DIAGNOSIS — C9110 Chronic lymphocytic leukemia of B-cell type not having achieved remission: Secondary | ICD-10-CM

## 2020-07-27 DIAGNOSIS — K6389 Other specified diseases of intestine: Secondary | ICD-10-CM

## 2020-07-27 DIAGNOSIS — C6290 Malignant neoplasm of unspecified testis, unspecified whether descended or undescended: Secondary | ICD-10-CM

## 2020-08-04 ENCOUNTER — Ambulatory Visit
Admission: RE | Admit: 2020-08-04 | Discharge: 2020-08-04 | Disposition: A | Discharge status: Home / Self Care | Payer: Medicare Other | Source: Ambulatory Visit | Attending: Internal Medicine | Admitting: Internal Medicine

## 2020-08-04 DIAGNOSIS — K769 Liver disease, unspecified: Secondary | ICD-10-CM | POA: Diagnosis not present

## 2020-08-04 DIAGNOSIS — K6389 Other specified diseases of intestine: Secondary | ICD-10-CM

## 2020-08-04 DIAGNOSIS — C6290 Malignant neoplasm of unspecified testis, unspecified whether descended or undescended: Secondary | ICD-10-CM

## 2020-08-04 DIAGNOSIS — K5730 Diverticulosis of large intestine without perforation or abscess without bleeding: Secondary | ICD-10-CM | POA: Diagnosis not present

## 2020-08-04 DIAGNOSIS — N200 Calculus of kidney: Secondary | ICD-10-CM | POA: Diagnosis not present

## 2020-08-04 DIAGNOSIS — C9110 Chronic lymphocytic leukemia of B-cell type not having achieved remission: Secondary | ICD-10-CM

## 2020-08-04 DIAGNOSIS — N210 Calculus in bladder: Secondary | ICD-10-CM | POA: Diagnosis not present

## 2020-08-04 MED ORDER — IOPAMIDOL (ISOVUE-300) INJECTION 61%
100.0000 mL | Freq: Once | INTRAVENOUS | Status: AC | PRN
  Administered 2020-08-04: 100 mL via INTRAVENOUS

## 2020-08-10 DIAGNOSIS — C7A8 Other malignant neuroendocrine tumors: Secondary | ICD-10-CM | POA: Diagnosis not present

## 2020-08-10 DIAGNOSIS — Z23 Encounter for immunization: Secondary | ICD-10-CM | POA: Diagnosis not present

## 2020-08-12 DIAGNOSIS — K622 Anal prolapse: Secondary | ICD-10-CM | POA: Diagnosis not present

## 2020-08-13 ENCOUNTER — Other Ambulatory Visit: Payer: Medicare Other

## 2020-08-16 DIAGNOSIS — N3000 Acute cystitis without hematuria: Secondary | ICD-10-CM | POA: Diagnosis not present

## 2020-08-16 DIAGNOSIS — K623 Rectal prolapse: Secondary | ICD-10-CM | POA: Diagnosis not present

## 2020-09-09 DIAGNOSIS — K625 Hemorrhage of anus and rectum: Secondary | ICD-10-CM | POA: Diagnosis not present

## 2020-09-09 DIAGNOSIS — K623 Rectal prolapse: Secondary | ICD-10-CM | POA: Diagnosis not present

## 2020-09-16 DIAGNOSIS — Z01812 Encounter for preprocedural laboratory examination: Secondary | ICD-10-CM | POA: Diagnosis not present

## 2020-09-17 ENCOUNTER — Ambulatory Visit (INDEPENDENT_AMBULATORY_CARE_PROVIDER_SITE_OTHER): Payer: Medicare Other

## 2020-09-17 ENCOUNTER — Telehealth: Payer: Self-pay

## 2020-09-17 DIAGNOSIS — I442 Atrioventricular block, complete: Secondary | ICD-10-CM

## 2020-09-17 LAB — CUP PACEART REMOTE DEVICE CHECK
Battery Impedance: 818 Ohm
Battery Remaining Longevity: 73 mo
Battery Voltage: 2.78 V
Brady Statistic AP VP Percent: 32 %
Brady Statistic AP VS Percent: 0 %
Brady Statistic AS VP Percent: 67 %
Brady Statistic AS VS Percent: 0 %
Date Time Interrogation Session: 20220121090813
Implantable Lead Implant Date: 20140625
Implantable Lead Implant Date: 20140625
Implantable Lead Location: 753859
Implantable Lead Location: 753860
Implantable Lead Model: 5076
Implantable Lead Model: 5076
Implantable Pulse Generator Implant Date: 20140625
Lead Channel Impedance Value: 463 Ohm
Lead Channel Impedance Value: 474 Ohm
Lead Channel Pacing Threshold Amplitude: 0.625 V
Lead Channel Pacing Threshold Amplitude: 0.625 V
Lead Channel Pacing Threshold Pulse Width: 0.4 ms
Lead Channel Pacing Threshold Pulse Width: 0.4 ms
Lead Channel Setting Pacing Amplitude: 1.5 V
Lead Channel Setting Pacing Amplitude: 2 V
Lead Channel Setting Pacing Pulse Width: 0.4 ms
Lead Channel Setting Sensing Sensitivity: 2 mV

## 2020-09-17 NOTE — Telephone Encounter (Signed)
Called to discuss with patient about COVID-19 symptoms and the use of one of the available treatments for those with mild to moderate Covid symptoms and at a high risk of hospitalization.  Pt appears to qualify for outpatient treatment due to co-morbid conditions and/or a member of an at-risk group in accordance with the FDA Emergency Use Authorization.    Symptom onset: Unknown Vaccinated: Yes per chart Booster? No Immunocompromised? Unknown Qualifiers: Heartdisease,Testicular cancer, leukemia  Unable to reach pt - Left message with call back number [phone].   Marcello Moores

## 2020-09-18 ENCOUNTER — Telehealth (HOSPITAL_COMMUNITY): Payer: Self-pay | Admitting: Pharmacist

## 2020-09-18 ENCOUNTER — Other Ambulatory Visit: Payer: Self-pay | Admitting: Physician Assistant

## 2020-09-18 MED ORDER — MOLNUPIRAVIR EUA 200MG CAPSULE: 4.0000 | ORAL_CAPSULE | Freq: Two times a day (BID) | ORAL | 0 refills | Status: AC

## 2020-09-18 MED FILL — MOLNUPIRAVIR 200 MG CAPS: 200 | 5 days supply | Qty: 40 | Fill #0

## 2020-09-18 NOTE — Telephone Encounter (Signed)
Patient was prescribed oral covid treatment MOLNUPIRAVIR and treatment note was reviewed. Medication has been received by [place] and reviewed for appropriateness.  Drug Interactions or Dosage Adjustments Noted: None  Delivery Method: Patient will pick up  Patient contacted for counseling on 09/18/2020 and verbalized understanding.   Delivery or Pick-Up Date: 09/18/2020   Deidre Ala 09/18/2020, 11:24 AM [facility] Out[facility] Pharmacist Phone# [phone]

## 2020-09-18 NOTE — Progress Notes (Signed)
Outpatient Oral COVID Treatment Note  I connected with [name] on 09/18/2020/11:13 AM by telephone and verified that I am speaking with the correct person using two identifiers.  I discussed the limitations, risks, security, and privacy concerns of performing an evaluation and management service by telephone and the availability of in person appointments. I also discussed with the patient that there may be a patient responsible charge related to this service. The patient expressed understanding and agreed to proceed.  Patient location: home  Provider location: home  Diagnosis: COVID-19 infection  Purpose of visit: Discussion of potential use of Molnupiravir or Paxlovid, a new treatment for mild to moderate COVID-19 viral infection in non-hospitalized patients.   Subjective: Patient is a 80 y.o. male who has been diagnosed with COVID 19 viral infection.  Their symptoms began on 1/18 with cold like symptoms.  He has been fully vaccinated and boosted.   Past Medical History:  Diagnosis Date  . Aortic stenosis    s/p AVR 6/14  . Asthma   . 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 Chronic lymphocytic leukemia ([place])    followed by Dr. Ouida Sills  . Depression   . DM2 (diabetes mellitus, type 2) (HCC)    fasting blood sugar - 90s  . Exposure to secondhand smoke    extensive exposure  . Hyperlipidemia    treated  . LBBB (left bundle branch block)   . Testicular cancer [facility])    s/p XRT and surgical resection.    Allergies  Allergen Reactions  . Banana Anaphylaxis  . Penicillins Rash    Has patient had a PCN reaction causing immediate rash, facial/tongue/throat swelling, SOB or lightheadedness with hypotension: Unknown Has patient had a PCN reaction causing severe rash involving mucus membranes or skin necrosis: Unknown Has patient had a PCN reaction that required hospitalization: No Has patient had a PCN reaction occurring within the last  10 years: No If all of the above answers are "NO", then may proceed with Cephalosporin use.      Current Outpatient Medications:  .  molnupiravir EUA 200 mg CAPS, Take 4 capsules (800 mg total) by mouth 2 (two) times daily for 5 days., Disp: 40 capsule, Rfl: 0 .  acetaminophen (TYLENOL) 500 MG tablet, Take 500 mg by mouth 3 (three) times daily. , Disp: , Rfl:  .  aspirin 81 MG tablet, Take 1 tablet (81 mg total) by mouth daily., Disp: 30 tablet, Rfl: 11 .  atorvastatin (LIPITOR) 40 MG tablet, Take 40 mg by mouth once a week., Disp: , Rfl:  .  busPIRone (BUSPAR) 15 MG tablet, Take 15-30 mg by mouth See admin instructions. One tablet by mouth every morning and two tablets by mouth every day at noon, Disp: , Rfl:  .  Cholecalciferol (VITAMIN D) 2000 units tablet, Take 2,000 Units by mouth every other day. , Disp: , Rfl:  .  cholestyramine light (PREVALITE) 4 GM/DOSE powder, Take 4 g by mouth 2 (two) times daily with a meal., Disp: , Rfl:  .  cycloSPORINE (RESTASIS) 0.05 % ophthalmic emulsion, Place 1 drop into both eyes 2 (two) times daily. , Disp: , Rfl:  .  fenofibrate 160 MG tablet, Take 160 mg by mouth daily. , Disp: , Rfl:  .  fluticasone (FLONASE) 50 MCG/ACT nasal spray, Place 2 sprays into the nose daily. , Disp: , Rfl:  .  FORA V12 BLOOD GLUCOSE TEST test strip, , Disp: ,  Rfl:  .  furosemide (LASIX) 40 MG tablet, Take 1 tablet (40 mg total) by mouth 2 (two) times daily. Needs appt for future refills, Disp: 60 tablet, Rfl: 2 .  gabapentin (NEURONTIN) 300 MG capsule, Take one (1) capsule (300 mg total) by mouth each morning. Take two (2) capsules (600 mg total) by mouth each afternoon., Disp: , Rfl:  .  LITETOUCH LANCETS MISC, , Disp: , Rfl:  .  montelukast (SINGULAIR) 10 MG tablet, Take 10 mg by mouth daily., Disp: , Rfl:  .  Multiple Vitamin (MULTIVITAMIN WITH MINERALS) TABS tablet, Take 1 tablet by mouth daily., Disp: , Rfl:  .  Polyethyl Glycol-Propyl Glycol (SYSTANE OP), Place 1 drop  into both eyes 2 (two) times daily., Disp: , Rfl:  .  potassium chloride SA (K-DUR,KLOR-CON) 20 MEQ tablet, TAKE ONE TABLET EVERY DAY, Disp: 90 tablet, Rfl: 3 .  Probiotic Product (PHILLIPS COLON HEALTH PO), Take 1 capsule by mouth daily., Disp: , Rfl:  .  PROCTOZONE-HC 2.5 % rectal cream, Place 1 application rectally 2 (two) times daily as needed. , Disp: , Rfl:  .  sertraline (ZOLOFT) 100 MG tablet, Take 100 mg by mouth daily. , Disp: , Rfl:  .  traZODone (DESYREL) 50 MG tablet, Take 150 mg by mouth at bedtime. , Disp: , Rfl:   Objective: Patient sounds in no apparent distress.  Breathing is non labored.  Mood and behavior are normal.  Laboratory Data:  Recent Results (from the past 2160 hour(s))  CBG monitoring, ED     Status: None   Collection Time: 07/19/20  6:59 PM  Result Value Ref Range   Glucose-Capillary 86 70 - 99 mg/dL    Comment: Glucose reference range applies only to samples taken after fasting for at least 8 hours.  CUP PACEART REMOTE DEVICE CHECK     Status: None   Collection Time: 09/17/20  9:08 AM  Result Value Ref Range   Date Time Interrogation Session [id number]    Pulse Generator Manufacturer MERM    Pulse Gen Model [id number] Adapta    Pulse Gen Serial Number [id number]    Clinic Name [facility] Pulse Generator Type Implantable Pulse Generator    Implantable Pulse Generator Implant Date [id number]    Implantable Lead Manufacturer MERM    Implantable Lead Model 5076 CapSureFix Novus    Implantable Lead Serial Number [id number]    Implantable Lead Implant Date JJ:[id number]    Implantable Lead Location Detail 1 APEX    Implantable Lead Location [id number]    Implantable Lead Manufacturer MERM    Implantable Lead Model 5076 CapSureFix Novus    Implantable Lead Serial Number [id number]    Implantable Lead Implant Date JJ:[id number]    Implantable Lead Location Detail 1 APPENDAGE    Implantable Lead Location [id number]    Lead Channel Setting Sensing  Sensitivity 2.00 mV   Lead Channel Setting Pacing Amplitude 1.500 V   Lead Channel Setting Pacing Pulse Width 0.40 ms   Lead Channel Setting Pacing Amplitude 2.000 V   Lead Channel Impedance Value 474 ohm   Lead Channel Pacing Threshold Amplitude 0.625 V   Lead Channel Pacing Threshold Pulse Width 0.40 ms   Lead Channel Impedance Value 463 ohm   Lead Channel Pacing Threshold Amplitude 0.625 V   Lead Channel Pacing Threshold Pulse Width 0.40 ms   Battery Status OK    Battery Remaining Longevity 73 mo   Battery Voltage 2.78 V   Battery  Impedance 818 ohm   Brady Statistic AP VP Percent 32 %   Brady Statistic AS VP Percent 67 %   Brady Statistic AP VS Percent 0 %   Brady Statistic AS VS Percent 0 %     Assessment: 80 y.o. male with mild/moderate COVID 19 viral infection diagnosed on 1/20 at high risk for progression to severe COVID 19.  Plan:  This patient is a 80 y.o. male that meets the following criteria for Emergency Use Authorization of: Molnupiravir  1. Age >18 yr 2. SARS-COV-2 positive test 3. Symptom onset < 5 days 4. Mild-to-moderate COVID disease with high risk for severe progression to hospitalization or death   I have spoken and communicated the following to the patient or parent/caregiver regarding: 1. Molnupiravir is an unapproved drug that is authorized for use under an Print production planner.  2. There are no adequate, approved, available products for the treatment of COVID-19 in adults who have mild-to-moderate COVID-19 and are at high risk for progressing to severe COVID-19, including hospitalization or death. 3. Other therapeutics are currently authorized. For additional information on all products authorized for treatment or prevention of COVID-19, please see [email or web address].  4. There are benefits and risks of taking this treatment as outlined in the  "Fact Sheet for Patients and Caregivers."  5. "Fact Sheet for Patients and Caregivers" was reviewed with patient. A hard copy will be provided to patient from pharmacy prior to the patient receiving treatment. 6. Patients should continue to self-isolate and use infection control measures (e.g., wear mask, isolate, social distance, avoid sharing personal items, clean and disinfect "high touch" surfaces, and frequent handwashing) according to CDC guidelines.  7. The patient or parent/caregiver has the option to accept or refuse treatment. 8. [place] has established a pregnancy surveillance program. 9. Females of childbearing potential should use a reliable method of contraception correctly and consistently, as applicable, for the duration of treatment and for 4 days after the last dose of Molnupiravir. 53. Males of reproductive potential who are sexually active with females of childbearing potential should use a reliable method of contraception correctly and consistently during treatment and for at least 3 months after the last dose. 11. Pregnancy status and risk was assessed. Patient verbalized understanding of precautions.   After reviewing above information with the patient, the patient agrees to receive molnupiravir.  Follow up instructions:    . Take prescription BID x 5 days as directed . Reach out to pharmacist for counseling on medication if desired . For concerns regarding further COVID symptoms please follow up with your PCP or urgent care . For urgent or life-threatening issues, seek care at your local emergency department  The patient was provided an opportunity to ask questions, and all were answered. The patient agreed with the plan and demonstrated an understanding of the instructions.   Script sent to [facility] and opted to pick up RX.  The patient was advised to call their PCP or seek an in-person evaluation if the symptoms worsen or if the  condition fails to improve as anticipated.   I provided 20 minutes of non face-to-face telephone visit time during this encounter, and > 50% was spent counseling as documented under my assessment & plan.  Angelena Form, PA-C 09/18/2020 /11:13 AM

## 2020-09-28 DIAGNOSIS — H269 Unspecified cataract: Secondary | ICD-10-CM

## 2020-09-28 HISTORY — DX: Unspecified cataract: H26.9

## 2020-09-29 NOTE — Progress Notes (Signed)
Remote pacemaker transmission.   

## 2020-10-07 DIAGNOSIS — E119 Type 2 diabetes mellitus without complications: Secondary | ICD-10-CM | POA: Diagnosis not present

## 2020-10-07 DIAGNOSIS — H04123 Dry eye syndrome of bilateral lacrimal glands: Secondary | ICD-10-CM | POA: Diagnosis not present

## 2020-10-07 DIAGNOSIS — H4321 Crystalline deposits in vitreous body, right eye: Secondary | ICD-10-CM | POA: Diagnosis not present

## 2020-10-07 DIAGNOSIS — H2513 Age-related nuclear cataract, bilateral: Secondary | ICD-10-CM | POA: Diagnosis not present

## 2020-10-07 DIAGNOSIS — H25013 Cortical age-related cataract, bilateral: Secondary | ICD-10-CM | POA: Diagnosis not present

## 2020-10-13 DIAGNOSIS — H25813 Combined forms of age-related cataract, bilateral: Secondary | ICD-10-CM | POA: Diagnosis not present

## 2020-10-20 DIAGNOSIS — H2512 Age-related nuclear cataract, left eye: Secondary | ICD-10-CM | POA: Diagnosis not present

## 2020-10-20 DIAGNOSIS — H25012 Cortical age-related cataract, left eye: Secondary | ICD-10-CM | POA: Diagnosis not present

## 2020-10-20 DIAGNOSIS — H2511 Age-related nuclear cataract, right eye: Secondary | ICD-10-CM | POA: Diagnosis not present

## 2020-10-20 DIAGNOSIS — H25011 Cortical age-related cataract, right eye: Secondary | ICD-10-CM | POA: Diagnosis not present

## 2020-10-27 DIAGNOSIS — H2512 Age-related nuclear cataract, left eye: Secondary | ICD-10-CM | POA: Diagnosis not present

## 2020-11-01 ENCOUNTER — Other Ambulatory Visit (HOSPITAL_COMMUNITY): Payer: Self-pay | Admitting: Internal Medicine

## 2020-11-19 DIAGNOSIS — Z01812 Encounter for preprocedural laboratory examination: Secondary | ICD-10-CM | POA: Diagnosis not present

## 2020-11-24 DIAGNOSIS — K6289 Other specified diseases of anus and rectum: Secondary | ICD-10-CM | POA: Diagnosis not present

## 2020-11-24 DIAGNOSIS — K5730 Diverticulosis of large intestine without perforation or abscess without bleeding: Secondary | ICD-10-CM | POA: Diagnosis not present

## 2020-11-24 DIAGNOSIS — K625 Hemorrhage of anus and rectum: Secondary | ICD-10-CM | POA: Diagnosis not present

## 2020-11-29 ENCOUNTER — Encounter (INDEPENDENT_AMBULATORY_CARE_PROVIDER_SITE_OTHER): Payer: Self-pay

## 2020-11-29 DIAGNOSIS — H35351 Cystoid macular degeneration, right eye: Secondary | ICD-10-CM | POA: Diagnosis not present

## 2020-11-30 ENCOUNTER — Other Ambulatory Visit: Payer: Self-pay

## 2020-11-30 ENCOUNTER — Encounter (INDEPENDENT_AMBULATORY_CARE_PROVIDER_SITE_OTHER): Payer: Self-pay | Admitting: Ophthalmology

## 2020-11-30 ENCOUNTER — Ambulatory Visit (INDEPENDENT_AMBULATORY_CARE_PROVIDER_SITE_OTHER): Payer: Medicare Other | Admitting: Ophthalmology

## 2020-11-30 DIAGNOSIS — H35351 Cystoid macular degeneration, right eye: Secondary | ICD-10-CM | POA: Insufficient documentation

## 2020-11-30 DIAGNOSIS — H43822 Vitreomacular adhesion, left eye: Secondary | ICD-10-CM | POA: Diagnosis not present

## 2020-11-30 DIAGNOSIS — H4321 Crystalline deposits in vitreous body, right eye: Secondary | ICD-10-CM | POA: Diagnosis not present

## 2020-11-30 DIAGNOSIS — H35371 Puckering of macula, right eye: Secondary | ICD-10-CM | POA: Diagnosis not present

## 2020-11-30 HISTORY — DX: Crystalline deposits in vitreous body, right eye: H43.21

## 2020-11-30 MED ORDER — KETOROLAC TROMETHAMINE 0.5 % OP SOLN: 1.0000 [drp] | Freq: Four times a day (QID) | OPHTHALMIC | 0 refills | Status: DC

## 2020-11-30 NOTE — Progress Notes (Addendum)
11/30/2020     CHIEF COMPLAINT Patient presents for Blurred Vision (Pt referred by Dr. Gershon Crane for CME OD. Cataract sx dates- OD 10/20/20 OS 10/27/20/Pt c/o OD becoming blurry. Pt states it was crisp after sx and gradually became blurry. Denies floaters and FOL. Pt is no longer using gtts.)   HISTORY OF PRESENT ILLNESS: [name] is a 80 y.o. male who presents to the clinic today for:   HPI    Blurred Vision    In right eye.  Onset was gradual.  Vision is blurred.  Severity is moderate.  Occurring constantly.  It is worse throughout the day.  Since onset it is stable. Additional comments: Pt referred by Dr. Gershon Crane for CME OD. Cataract sx dates- OD 10/20/20 OS 10/27/20 Pt c/o OD becoming blurry. Pt states it was crisp after sx and gradually became blurry. Denies floaters and FOL. Pt is no longer using gtts.          Comments    Immediately postop, excellent visual acuity right eye. He has developed in the right eye.,  Slow onset of visual blurring  Currently on no medical therapy topically OU        Last edited by Hurman Horn, MD on 11/30/2020  3:42 PM. (History)      Referring physician: Jonathon Jordan, MD [address],  [place] [address]  HISTORICAL INFORMATION:   Selected notes from the MEDICAL RECORD NUMBER    Lab Results  Component Value Date   HGBA1C 6.1 (H) 10/04/2017     CURRENT MEDICATIONS: Current Outpatient Medications (Ophthalmic Drugs)  Medication Sig  . ketorolac (ACULAR) 0.5 % ophthalmic solution Place 1 drop into the right eye 4 (four) times daily.  . cycloSPORINE (RESTASIS) 0.05 % ophthalmic emulsion Place 1 drop into both eyes 2 (two) times daily.   Vladimir Faster Glycol-Propyl Glycol (SYSTANE OP) Place 1 drop into both eyes 2 (two) times daily.   No current facility-administered medications for this visit. (Ophthalmic Drugs)   Current Outpatient Medications (Other)  Medication Sig  . acetaminophen (TYLENOL) 500 MG tablet  Take 500 mg by mouth 3 (three) times daily.   Marland Kitchen aspirin 81 MG tablet Take 1 tablet (81 mg total) by mouth daily.  Marland Kitchen atorvastatin (LIPITOR) 40 MG tablet Take 40 mg by mouth once a week.  . busPIRone (BUSPAR) 15 MG tablet Take 15-30 mg by mouth See admin instructions. One tablet by mouth every morning and two tablets by mouth every day at noon  . Cholecalciferol (VITAMIN D) 2000 units tablet Take 2,000 Units by mouth every other day.   . cholestyramine light (PREVALITE) 4 GM/DOSE powder Take 4 g by mouth 2 (two) times daily with a meal.  . fenofibrate 160 MG tablet Take 160 mg by mouth daily.   . fluticasone (FLONASE) 50 MCG/ACT nasal spray Place 2 sprays into the nose daily.   Marland Kitchen FORA V12 BLOOD GLUCOSE TEST test strip   . furosemide (LASIX) 40 MG tablet TAKE ONE TABLET TWICE DAILY  . gabapentin (NEURONTIN) 300 MG capsule Take one (1) capsule (300 mg total) by mouth each morning. Take two (2) capsules (600 mg total) by mouth each afternoon.  Marland Kitchen LITETOUCH LANCETS MISC   . Molnupiravir 200 MG CAPS TAKE 4 CAPSULES BY MOUTH 2 TIMES DAILY FOR 5 DAYS.  Marland Kitchen montelukast (SINGULAIR) 10 MG tablet Take 10 mg by mouth daily.  . Multiple Vitamin (MULTIVITAMIN WITH MINERALS) TABS tablet Take 1 tablet  by mouth daily.  . potassium chloride SA (K-DUR,KLOR-CON) 20 MEQ tablet TAKE ONE TABLET EVERY DAY  . Probiotic Product (PHILLIPS COLON HEALTH PO) Take 1 capsule by mouth daily.  Marland Kitchen PROCTOZONE-HC 2.5 % rectal cream Place 1 application rectally 2 (two) times daily as needed.   . sertraline (ZOLOFT) 100 MG tablet Take 100 mg by mouth daily.   . traZODone (DESYREL) 50 MG tablet Take 150 mg by mouth at bedtime.    No current facility-administered medications for this visit. (Other)      REVIEW OF SYSTEMS:    ALLERGIES Allergies  Allergen Reactions  . Banana Anaphylaxis  . Penicillins Rash    Has patient had a PCN reaction causing immediate rash, facial/tongue/throat swelling, SOB or lightheadedness with  hypotension: Unknown Has patient had a PCN reaction causing severe rash involving mucus membranes or skin necrosis: Unknown Has patient had a PCN reaction that required hospitalization: No Has patient had a PCN reaction occurring within the last 10 years: No If all of the above answers are "NO", then may proceed with Cephalosporin use.     PAST MEDICAL HISTORY Past Medical History:  Diagnosis Date  . Aortic stenosis    s/p AVR 6/14  . Asthma   . 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 Chronic lymphocytic leukemia ([place])    followed by Dr. Ouida Sills  . Depression   . DM2 (diabetes mellitus, type 2) (HCC)    fasting blood sugar - 90s  . Exposure to secondhand smoke    extensive exposure  . Hyperlipidemia    treated  . LBBB (left bundle branch block)   . Testicular cancer [facility])    s/p XRT and surgical resection.   Past Surgical History:  Procedure Laterality Date  . AORTIC VALVE REPLACEMENT N/A 02/17/2013   Procedure: AORTIC VALVE REPLACEMENT (AVR);  Surgeon: Gaye Pollack, MD;  Location: [place];  Service: Open Heart Surgery;  Laterality: N/A;  . CYST REMOVAL NECK    . INNER EAR SURGERY    . INTRAOPERATIVE TRANSESOPHAGEAL ECHOCARDIOGRAM N/A 02/17/2013   Procedure: INTRAOPERATIVE TRANSESOPHAGEAL ECHOCARDIOGRAM;  Surgeon: Gaye Pollack, MD;  Location: [facility] OR;  Service: Open Heart Surgery;  Laterality: N/A;  . LEFT AND RIGHT HEART CATHETERIZATION WITH CORONARY ANGIOGRAM N/A 12/25/2012   Procedure: LEFT AND RIGHT HEART CATHETERIZATION WITH CORONARY ANGIOGRAM;  Surgeon: Jolaine Artist, MD;  Location: [facility] CATH LAB;  Service: Cardiovascular;  Laterality: N/A;  . PERMANENT PACEMAKER INSERTION N/A 02/19/2013   Procedure: PERMANENT PACEMAKER INSERTION;  Surgeon: Deboraha Sprang, MD;  Location: [facility] CATH LAB;  Service: Cardiovascular;  Laterality: N/A;  . RECTAL BIOPSY N/A 10/08/2017   Procedure: EXCISION OF ANAL POLYP ;  Surgeon: Ileana Roup, MD;   Location: [facility] ORS;  Service: General;  Laterality: N/A;  . stapendectomy  07/30/02   right  . XRT and surgical resection     s/p. 10 years ago    FAMILY HISTORY Family History  Problem Relation Age of Onset  . Brain cancer Mother   . Cancer Father        lung    SOCIAL HISTORY Social History   Tobacco Use  . Smoking status: Never Smoker  . Smokeless tobacco: Never Used  Vaping Use  . Vaping Use: Never used  Substance Use Topics  . Alcohol use: No  . Drug use: No         OPHTHALMIC EXAM:  Base Eye  Exam    Visual Acuity (Snellen - Linear)      Right Left   Dist [place] 20/80 -1 20/20 -2   Dist ph [place] NI        Tonometry (Tonopen, 2:45 PM)      Right Left   Pressure 8 6       Pupils      Pupils Dark Light Shape React APD   Right PERRL 4 3 Round Slow None   Left PERRL 4 3 Round Slow None       Visual Fields (Counting fingers)      Left Right    Full Full       Neuro/Psych    Oriented x3: Yes   Mood/Affect: Normal       Dilation    Both eyes: 1.0% Mydriacyl, 2.5% Phenylephrine @ 2:45 PM          IMAGING AND PROCEDURES  Imaging and Procedures for 11/30/20  OCT, Retina - OU - Both Eyes       Right Eye Quality was good. Scan locations included subfoveal. Central Foveal Thickness: 403. Progression has no prior data. Findings include abnormal foveal contour, cystoid macular edema, epiretinal membrane, vitreomacular adhesion .   Left Eye Quality was good. Scan locations included subfoveal. Central Foveal Thickness: 301. Progression has no prior data. Findings include vitreomacular adhesion , normal foveal contour.   Notes CME with overlying epiretinal membrane OD, with elevation of the foveal depression as if the epiretinal membrane is a component of visual difficulty and or en face vitreomacular adhesion  OS with incidental VMA, no topographic distortions       Color Fundus Photography Optos - OU - Both Eyes       Right Eye Progression has  no prior data. Disc findings include normal observations. Macula : edema. Vessels : normal observations. Periphery : normal observations.   Left Eye Progression has no prior data. Disc findings include normal observations. Vessels : normal observations. Periphery : normal observations.   Notes Dense hyaloid asterosis OD                ASSESSMENT/PLAN:  Macular pucker, right eye OD, minor as there is no clinical topographic distortion on OCT nor clinically detected thus may be secondary to the CME present  Cystoid macular edema, right eye Late onset CME OD does suggest there may be a role of vitreomacular adhesion, the patient does have asteroid hyalosis which in my experience almost never develops a spontaneous PVD.  There may be a role in that ongoing here  Nonetheless we will treat as typical pseudophakic CME with topical NSAID, ketorolac 4 times daily as a generic or patient will have the option to use PROLENSA 1 drop twice daily for 2 weeks prior to curtailing to once daily if he chooses the more expensive therapeutic route  Asteroid hyalosis of right eye I discussed with the patient the typical taught adherence of asteroid hyalosis to the retina and then my experience in 30 years and never seen a spontaneous posterior vitreous detachment that typically happens with the aging process.  This may be contributing to a vitreomacular adhesive elevation of the macula in a en face en bloc fashion contributing to CME  Vitreomacular adhesion, left Physiologic, no vitreomacular topographic distortions      ICD-10-CM   1. Cystoid macular edema, right eye  H35.351 OCT, Retina - OU - Both Eyes    Color Fundus Photography Optos - OU - Both Eyes  2. Macular pucker, right eye  H35.371 OCT, Retina - OU - Both Eyes  3. Asteroid hyalosis of right eye  H43.21 Color Fundus Photography Optos - OU - Both Eyes  4. Vitreomacular adhesion, left  H43.822     1.  We will attempt to resolve this  ocular condition via topical therapy alone initially and if no improvement may need surgical intervention via vitrectomy release of the vitreomacular adhesion and possible epiretinal membrane peel  2.  I used booklets and color photos and wall charge to display the interactions of the vitreous and the epiretinal membrane and the potential for macular edema  3.  Ophthalmic Meds Ordered this visit:  Meds ordered this encounter  Medications  . ketorolac (ACULAR) 0.5 % ophthalmic solution    Sig: Place 1 drop into the right eye 4 (four) times daily.    Dispense:  5 mL    Refill:  0       Return in about 10 weeks (around 02/08/2021) for dilate, OD, OCT.  Patient Instructions  I discussed with the patient the use of topical medications for 4 to 6 weeks look for signs of resolution of CME with topical NSAIDs alone    Explained the diagnoses, plan, and follow up with the patient and they expressed understanding.  Patient expressed understanding of the importance of proper follow up care.   Clent Demark Keanna Tugwell M.D. Diseases & Surgery of the Retina and Vitreous Retina & Diabetic [place] 11/30/20     Abbreviations: M myopia (nearsighted); A astigmatism; H hyperopia (farsighted); P presbyopia; Mrx spectacle prescription;  CTL contact lenses; OD right eye; OS left eye; OU both eyes  XT exotropia; ET esotropia; PEK punctate epithelial keratitis; PEE punctate epithelial erosions; DES dry eye syndrome; MGD meibomian gland dysfunction; ATs artificial tears; PFAT's preservative free artificial tears; Delta nuclear sclerotic cataract; PSC posterior subcapsular cataract; ERM epi-retinal membrane; PVD posterior vitreous detachment; RD retinal detachment; DM diabetes mellitus; DR diabetic retinopathy; NPDR non-proliferative diabetic retinopathy; PDR proliferative diabetic retinopathy; CSME clinically significant macular edema; DME diabetic macular edema; dbh dot blot hemorrhages; CWS cotton wool spot; POAG  primary open angle glaucoma; C/D cup-to-disc ratio; HVF humphrey visual field; GVF goldmann visual field; OCT optical coherence tomography; IOP intraocular pressure; BRVO Branch retinal vein occlusion; CRVO central retinal vein occlusion; CRAO central retinal artery occlusion; BRAO branch retinal artery occlusion; RT retinal tear; SB scleral buckle; PPV pars plana vitrectomy; VH Vitreous hemorrhage; PRP panretinal laser photocoagulation; IVK intravitreal kenalog; VMT vitreomacular traction; MH Macular hole;  NVD neovascularization of the disc; NVE neovascularization elsewhere; AREDS age related eye disease study; ARMD age related macular degeneration; POAG primary open angle glaucoma; EBMD epithelial/anterior basement membrane dystrophy; ACIOL anterior chamber intraocular lens; IOL intraocular lens; PCIOL posterior chamber intraocular lens; Phaco/IOL phacoemulsification with intraocular lens placement; [place] photorefractive keratectomy; LASIK laser assisted in situ keratomileusis; HTN hypertension; DM diabetes mellitus; COPD chronic obstructive pulmonary disease

## 2020-11-30 NOTE — Assessment & Plan Note (Signed)
OD, minor as there is no clinical topographic distortion on OCT nor clinically detected thus may be secondary to the CME present

## 2020-11-30 NOTE — Patient Instructions (Signed)
I discussed with the patient the use of topical medications for 4 to 6 weeks look for signs of resolution of CME with topical NSAIDs alone

## 2020-11-30 NOTE — Assessment & Plan Note (Signed)
Physiologic, no vitreomacular topographic distortions

## 2020-11-30 NOTE — Assessment & Plan Note (Signed)
Late onset CME OD does suggest there may be a role of vitreomacular adhesion, the patient does have asteroid hyalosis which in my experience almost never develops a spontaneous PVD.  There may be a role in that ongoing here  Nonetheless we will treat as typical pseudophakic CME with topical NSAID, ketorolac 4 times daily as a generic or patient will have the option to use PROLENSA 1 drop twice daily for 2 weeks prior to curtailing to once daily if he chooses the more expensive therapeutic route

## 2020-11-30 NOTE — Assessment & Plan Note (Signed)
I discussed with the patient the typical taught adherence of asteroid hyalosis to the retina and then my experience in 30 years and never seen a spontaneous posterior vitreous detachment that typically happens with the aging process.  This may be contributing to a vitreomacular adhesive elevation of the macula in a en face en bloc fashion contributing to CME

## 2020-12-05 DIAGNOSIS — Z23 Encounter for immunization: Secondary | ICD-10-CM | POA: Diagnosis not present

## 2020-12-13 DIAGNOSIS — K623 Rectal prolapse: Secondary | ICD-10-CM | POA: Diagnosis not present

## 2020-12-17 ENCOUNTER — Ambulatory Visit (INDEPENDENT_AMBULATORY_CARE_PROVIDER_SITE_OTHER): Payer: Medicare Other

## 2020-12-17 DIAGNOSIS — I442 Atrioventricular block, complete: Secondary | ICD-10-CM | POA: Diagnosis not present

## 2020-12-18 LAB — CUP PACEART REMOTE DEVICE CHECK
Battery Impedance: 945 Ohm
Battery Remaining Longevity: 67 mo
Battery Voltage: 2.78 V
Brady Statistic AP VP Percent: 48 %
Brady Statistic AP VS Percent: 0 %
Brady Statistic AS VP Percent: 52 %
Brady Statistic AS VS Percent: 0 %
Date Time Interrogation Session: 20220422110042
Implantable Lead Implant Date: 20140625
Implantable Lead Implant Date: 20140625
Implantable Lead Location: 753859
Implantable Lead Location: 753860
Implantable Lead Model: 5076
Implantable Lead Model: 5076
Implantable Pulse Generator Implant Date: 20140625
Lead Channel Impedance Value: 478 Ohm
Lead Channel Impedance Value: 480 Ohm
Lead Channel Pacing Threshold Amplitude: 0.625 V
Lead Channel Pacing Threshold Amplitude: 0.625 V
Lead Channel Pacing Threshold Pulse Width: 0.4 ms
Lead Channel Pacing Threshold Pulse Width: 0.4 ms
Lead Channel Setting Pacing Amplitude: 1.5 V
Lead Channel Setting Pacing Amplitude: 2 V
Lead Channel Setting Pacing Pulse Width: 0.4 ms
Lead Channel Setting Sensing Sensitivity: 2 mV

## 2020-12-21 ENCOUNTER — Other Ambulatory Visit: Payer: Self-pay | Admitting: Urology

## 2021-01-05 NOTE — Progress Notes (Signed)
Remote pacemaker transmission.   

## 2021-01-06 ENCOUNTER — Other Ambulatory Visit: Payer: Self-pay

## 2021-01-06 ENCOUNTER — Encounter (HOSPITAL_COMMUNITY): Payer: Self-pay | Admitting: Internal Medicine

## 2021-01-06 ENCOUNTER — Ambulatory Visit (HOSPITAL_COMMUNITY)
Admission: RE | Admit: 2021-01-06 | Discharge: 2021-01-06 | Disposition: A | Discharge status: Home / Self Care | Payer: Medicare Other | Source: Ambulatory Visit | Attending: Internal Medicine | Admitting: Internal Medicine

## 2021-01-06 VITALS — BP 120/60 | HR 70 | Wt 119.2 lb

## 2021-01-06 DIAGNOSIS — Z7982 Long term (current) use of aspirin: Secondary | ICD-10-CM | POA: Insufficient documentation

## 2021-01-06 DIAGNOSIS — Z950 Presence of cardiac pacemaker: Secondary | ICD-10-CM | POA: Insufficient documentation

## 2021-01-06 DIAGNOSIS — Z952 Presence of prosthetic heart valve: Secondary | ICD-10-CM

## 2021-01-06 DIAGNOSIS — E119 Type 2 diabetes mellitus without complications: Secondary | ICD-10-CM | POA: Insufficient documentation

## 2021-01-06 DIAGNOSIS — Z953 Presence of xenogenic heart valve: Secondary | ICD-10-CM | POA: Insufficient documentation

## 2021-01-06 DIAGNOSIS — K623 Rectal prolapse: Secondary | ICD-10-CM | POA: Diagnosis not present

## 2021-01-06 DIAGNOSIS — E785 Hyperlipidemia, unspecified: Secondary | ICD-10-CM | POA: Diagnosis not present

## 2021-01-06 DIAGNOSIS — Z79899 Other long term (current) drug therapy: Secondary | ICD-10-CM | POA: Diagnosis not present

## 2021-01-06 DIAGNOSIS — Z452 Encounter for adjustment and management of vascular access device: Secondary | ICD-10-CM | POA: Diagnosis not present

## 2021-01-06 DIAGNOSIS — Z791 Long term (current) use of non-steroidal anti-inflammatories (NSAID): Secondary | ICD-10-CM | POA: Diagnosis not present

## 2021-01-06 DIAGNOSIS — I5032 Chronic diastolic (congestive) heart failure: Secondary | ICD-10-CM | POA: Diagnosis not present

## 2021-01-06 DIAGNOSIS — I442 Atrioventricular block, complete: Secondary | ICD-10-CM | POA: Insufficient documentation

## 2021-01-06 DIAGNOSIS — Z01810 Encounter for preprocedural cardiovascular examination: Secondary | ICD-10-CM

## 2021-01-06 HISTORY — DX: Heart failure, unspecified: I50.9

## 2021-01-06 NOTE — Progress Notes (Addendum)
CARDIOLOGY CLINIC NOTE  Patient ID: ORVEL CUTSFORTH, male   DOB: 1941/05/16, 80 y.o.   MRN: [id number]  PCP: Dr. Tor Netters is 80 y/o male with h/o AS s/p bioprosthetic AVR (01/2013), DM2, CHB (s/p pacemaker) and hyperlipidemia. Bioprosthetic AVR with ligation of diagonal-PA fistula by Dr. Cyndia Bent on 02/17/13. He had post-operative complete heart block. A Medtronic dual chamber pacemaker was placed 02/19/13.   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 Leamon Arnt.  Very stable.   Here for f/u. Over past year has been found to have rectal prolapse. Pending repair with Dr. Dema Severin in June. Due to rectal prolapse hasn't been able to do much. No CP or SOB. Takes lasix bid for ankle edema.   Echo 10/19 EF 55-60% AVR stable  Echo 9/17: EF 60% AVR stable   ROS: All systems negative except as listed in HPI, PMH and Problem List.  SH: Married, lives in [place], nonsmoker, works at the [employer].   Past Medical History  Diagnosis Date  . Aortic stenosis     moderate. Echo 3/10: EF 55-60%. mean gradient 25 mmHg. AVA 1.23cm2. Echo 3/11: Normal EF. mean AVA gradient 5mmHg. exercise myoview 2008: positive ECG. normal perfusion.  Bioprosthetic AVR with ligation of diagonal-PA fistula  . Exposure to secondhand smoke     extensive exposure  . Hyperlipidemia     treated  . Depression   . Asthma   . Heart murmur   . LBBB (left bundle branch block)   . Shortness of breath     exertion  . DM2 (diabetes mellitus, type 2)     fasting blood sugar - 90s  . Dizziness   . Testicular cancer     s/p XRT and surgical resection.  . Chronic lymphocytic leukemia     followed by Dr. Ouida Sills  - CHB post-op AoV surgery.  Medtronic dual chamber PCM.   Current Outpatient Medications  Medication Sig Dispense Refill  . acetaminophen (TYLENOL) 500 MG  tablet Take 500 mg by mouth 3 (three) times daily.    Marland Kitchen aspirin 81 MG tablet Take 1 tablet (81 mg total) by mouth daily. 30 tablet 11  . busPIRone (BUSPAR) 15 MG tablet Take 15-30 mg by mouth See admin instructions. One tablet by mouth every morning and two tablets by mouth every day at noon    . Cholecalciferol (VITAMIN D) 2000 units tablet Take 2,000 Units by mouth every other day.     . cycloSPORINE (RESTASIS) 0.05 % ophthalmic emulsion Place 1 drop into both eyes 2 (two) times daily.    . fenofibrate 160 MG tablet Take 160 mg by mouth daily.    . fluticasone (FLONASE) 50 MCG/ACT nasal spray Place 2 sprays into the nose daily.    Marland Kitchen FORA V12 BLOOD GLUCOSE TEST test strip     . furosemide (LASIX) 40 MG tablet TAKE ONE TABLET TWICE DAILY 60 tablet 2  . gabapentin (NEURONTIN) 300 MG capsule Take one (1) capsule (300 mg total) by mouth each morning. Take two (2) capsules (600 mg total) by mouth each afternoon.    Marland Kitchen ketorolac (ACULAR) 0.5 % ophthalmic solution Place 1 drop into the right eye 4 (four) times daily. 5 mL 0  . LITETOUCH LANCETS MISC     . montelukast (SINGULAIR) 10 MG tablet Take 10 mg by mouth daily.    . Multiple Vitamin (  MULTIVITAMIN WITH MINERALS) TABS tablet Take 1 tablet by mouth daily.    Vladimir Faster Glycol-Propyl Glycol (SYSTANE OP) Place 1 drop into both eyes 2 (two) times daily.    . potassium chloride SA (K-DUR,KLOR-CON) 20 MEQ tablet TAKE ONE TABLET EVERY DAY 90 tablet 3  . Probiotic Product (PHILLIPS COLON HEALTH PO) Take 1 capsule by mouth daily.    Marland Kitchen PROCTOZONE-HC 2.5 % rectal cream Place 1 application rectally 2 (two) times daily as needed.     . sertraline (ZOLOFT) 100 MG tablet Take 100 mg by mouth daily.    . traZODone (DESYREL) 50 MG tablet Take 150 mg by mouth at bedtime.     No current facility-administered medications for this encounter.     PHYSICAL EXAM: Vitals:   01/06/21 1358  BP: 120/60  Pulse: 70  SpO2: 95%  Weight: 54.1 kg (119 lb 3.2 oz)    General:  Well appearing. No resp difficulty HEENT: normal Neck: supple. no JVD. Carotids 2+ bilat; no bruits. No lymphadenopathy or thryomegaly appreciated. Cor: PMI nondisplaced. Regular rate & rhythm.  2/6 SEM RUSB. S2 crisp Lungs: clear Abdomen: soft, nontender, nondistended. No hepatosplenomegaly. No bruits or masses. Good bowel sounds. Extremities: no cyanosis, clubbing, rash, edema Neuro: alert & orientedx3, cranial nerves grossly intact. moves all 4 extremities w/o difficulty. Affect pleasant   ASSESSMENT & PLAN: 1. Status post bioprosthetic AVR: Stable. Doing well continues with CR maintenance program. He is compliant with SBE prophylaxis - due for echo  2. Complete heart block: Medtronic PCM.  Followed by Dr. Caryl Comes.  - Pacer interrogated today. Normal function  AS-VP 51%, AP-VP 49% - given high degree of VP will check echo  3. Diagonal-PA fistula: Ligated at time of AVR.  4. HL -  Per PCP 5. Rectal prolapse - pre-op CV examination - ok to proceed with surgery from my standpoint without any further cardiac testing    Glori Bickers MD 01/06/2021

## 2021-01-06 NOTE — Patient Instructions (Signed)
Your physician has requested that you have an echocardiogram. Echocardiography is a painless test that uses sound waves to create images of your heart. It provides your doctor with information about the size and shape of your heart and how well your heart's chambers and valves are working. This procedure takes approximately one hour. There are no restrictions for this procedure.  Please call our office in 1 year to schedule your follow up appointment  If you have any questions or concerns before your next appointment please send us a message through mychart or call our office at [phone].    TO LEAVE A MESSAGE FOR THE NURSE SELECT OPTION 2, PLEASE LEAVE A MESSAGE INCLUDING: . YOUR NAME . DATE OF BIRTH . CALL BACK NUMBER . REASON FOR CALL**this is important as we prioritize the call backs  YOU WILL RECEIVE A CALL BACK THE SAME DAY AS LONG AS YOU CALL BEFORE 4:00 PM  At the [facility], you and your health needs are our priority. As part of our continuing mission to provide you with exceptional heart care, we have created designated Provider Care Teams. These Care Teams include your primary Cardiologist (physician) and Advanced Practice Providers (APPs- Physician Assistants and Nurse Practitioners) who all work together to provide you with the care you need, when you need it.   You may see any of the following providers on your designated Care Team at your next follow up: . Dr Daniel Bensimhon . Dr Dalton McLean . Dr Brandon Winfrey . Amy Clegg, NP . Brittainy Simmons, PA . Jessica Milford,NP . Lauren Kemp, PharmD   Please be sure to bring in all your medications bottles to every appointment.    

## 2021-01-06 NOTE — Addendum Note (Signed)
Encounter addended by: Scarlette Calico, RN on: 01/06/2021 2:50 PM  Actions taken: Visit diagnoses modified, Order list changed, Diagnosis association updated

## 2021-01-06 NOTE — Addendum Note (Signed)
Encounter addended by: Scarlette Calico, RN on: 01/06/2021 2:44 PM  Actions taken: Clinical Note Signed

## 2021-01-17 ENCOUNTER — Other Ambulatory Visit: Payer: Self-pay

## 2021-01-17 ENCOUNTER — Ambulatory Visit (HOSPITAL_COMMUNITY)
Admission: RE | Admit: 2021-01-17 | Discharge: 2021-01-17 | Disposition: A | Discharge status: Home / Self Care | Payer: Medicare Other | Source: Ambulatory Visit | Attending: Family Medicine | Admitting: Family Medicine

## 2021-01-17 DIAGNOSIS — I447 Left bundle-branch block, unspecified: Secondary | ICD-10-CM | POA: Insufficient documentation

## 2021-01-17 DIAGNOSIS — E785 Hyperlipidemia, unspecified: Secondary | ICD-10-CM | POA: Insufficient documentation

## 2021-01-17 DIAGNOSIS — I071 Rheumatic tricuspid insufficiency: Secondary | ICD-10-CM | POA: Insufficient documentation

## 2021-01-17 DIAGNOSIS — E119 Type 2 diabetes mellitus without complications: Secondary | ICD-10-CM | POA: Diagnosis not present

## 2021-01-17 DIAGNOSIS — I5032 Chronic diastolic (congestive) heart failure: Secondary | ICD-10-CM

## 2021-01-17 LAB — ECHOCARDIOGRAM COMPLETE
AR max vel: 1.93 cm2
AV Area VTI: 1.95 cm2
AV Area mean vel: 1.79 cm2
AV Mean grad: 7.3 mmHg
AV Peak grad: 13.2 mmHg
Ao pk vel: 1.82 m/s
Area-P 1/2: 2.66 cm2
S' Lateral: 3 cm

## 2021-01-17 NOTE — Progress Notes (Signed)
  Echocardiogram 2D Echocardiogram has been performed.  [name] 01/17/2021, 9:04 AM

## 2021-01-31 DIAGNOSIS — F325 Major depressive disorder, single episode, in full remission: Secondary | ICD-10-CM | POA: Diagnosis not present

## 2021-01-31 DIAGNOSIS — N400 Enlarged prostate without lower urinary tract symptoms: Secondary | ICD-10-CM | POA: Diagnosis not present

## 2021-01-31 DIAGNOSIS — Z953 Presence of xenogenic heart valve: Secondary | ICD-10-CM | POA: Diagnosis not present

## 2021-01-31 DIAGNOSIS — C4442 Squamous cell carcinoma of skin of scalp and neck: Secondary | ICD-10-CM | POA: Diagnosis not present

## 2021-01-31 DIAGNOSIS — E782 Mixed hyperlipidemia: Secondary | ICD-10-CM | POA: Diagnosis not present

## 2021-01-31 DIAGNOSIS — E1122 Type 2 diabetes mellitus with diabetic chronic kidney disease: Secondary | ICD-10-CM | POA: Diagnosis not present

## 2021-01-31 DIAGNOSIS — N182 Chronic kidney disease, stage 2 (mild): Secondary | ICD-10-CM | POA: Diagnosis not present

## 2021-01-31 DIAGNOSIS — Z79899 Other long term (current) drug therapy: Secondary | ICD-10-CM | POA: Diagnosis not present

## 2021-01-31 DIAGNOSIS — E538 Deficiency of other specified B group vitamins: Secondary | ICD-10-CM | POA: Diagnosis not present

## 2021-01-31 DIAGNOSIS — C4491 Basal cell carcinoma of skin, unspecified: Secondary | ICD-10-CM | POA: Diagnosis not present

## 2021-01-31 DIAGNOSIS — Z0000 Encounter for general adult medical examination without abnormal findings: Secondary | ICD-10-CM | POA: Diagnosis not present

## 2021-01-31 DIAGNOSIS — Z23 Encounter for immunization: Secondary | ICD-10-CM | POA: Diagnosis not present

## 2021-01-31 DIAGNOSIS — D3A098 Benign carcinoid tumors of other sites: Secondary | ICD-10-CM | POA: Diagnosis not present

## 2021-01-31 NOTE — Progress Notes (Signed)
Pt. Needs orders for upcomming procedure.PAT and labs on: 02/01/21.

## 2021-01-31 NOTE — Patient Instructions (Addendum)
DUE TO COVID-19 ONLY ONE VISITOR IS ALLOWED TO COME WITH YOU AND STAY IN THE WAITING ROOM ONLY DURING PRE OP AND PROCEDURE DAY OF SURGERY. THE 1 VISITOR  MAY VISIT WITH YOU AFTER SURGERY IN YOUR PRIVATE ROOM DURING VISITING HOURS ONLY!  YOU NEED TO HAVE A COVID 19 TEST ON: 02/07/21 @ 12:00 PM, THIS TEST MUST BE DONE BEFORE SURGERY,  COVID TESTING SITE [place] [address], IT IS ON THE RIGHT GOING OUT [address] APPROXIMATELY  2 MINUTES PAST [employer] ON THE RIGHT. ONCE YOUR COVID TEST IS COMPLETED,  PLEASE BEGIN THE QUARANTINE INSTRUCTIONS AS OUTLINED IN YOUR HANDOUT.                [name]    Your procedure is scheduled on: 02/09/21   Report to [facility] Main  Entrance   Report to admitting at: 6:30 AM     Call this number if you have problems the morning of surgery [phone]    Remember: Do not eat food or drink liquids :After Midnight.   BRUSH YOUR TEETH MORNING OF SURGERY AND RINSE YOUR MOUTH OUT, NO CHEWING GUM CANDY OR MINTS.    Take these medicines the morning of surgery with A SIP OF WATER: buspirone,gabapentin,sertraline,fenofibrate. Use nasal spray and eye drops as usual.                               You may not have any metal on your body including hair pins and              piercings  Do not wear jewelry, lotions, powders or perfumes, deodorant             Men may shave face and neck.   Do not bring valuables to the hospital. [place].  Contacts, dentures or bridgework may not be worn into surgery.  Leave suitcase in the car. After surgery it may be brought to your room.     Patients discharged the day of surgery will not be allowed to drive home. IF YOU ARE HAVING SURGERY AND GOING HOME THE SAME DAY, YOU MUST HAVE AN ADULT TO DRIVE YOU HOME AND BE WITH YOU FOR 24 HOURS. YOU MAY GO HOME BY TAXI OR UBER OR ORTHERWISE, BUT AN ADULT MUST ACCOMPANY YOU HOME AND STAY  WITH YOU FOR 24 HOURS.  Name and phone number of your driver:  Special Instructions: N/A              Please read over the following fact sheets you were given: _____________________________________________________________________         [facility] - Preparing for Surgery Before surgery, you can play an important role.  Because skin is not sterile, your skin needs to be as free of germs as possible.  You can reduce the number of germs on your skin by washing with CHG (chlorahexidine gluconate) soap before surgery.  CHG is an antiseptic cleaner which kills germs and bonds with the skin to continue killing germs even after washing. Please DO NOT use if you have an allergy to CHG or antibacterial soaps.  If your skin becomes reddened/irritated stop using the CHG and inform your nurse when you arrive at Short Stay. Do not shave (including legs and underarms) for at  least 48 hours prior to the first CHG shower.  You may shave your face/neck. Please follow these instructions carefully:  1.  Shower with CHG Soap the night before surgery and the  morning of Surgery.  2.  If you choose to wash your hair, wash your hair first as usual with your  normal  shampoo.  3.  After you shampoo, rinse your hair and body thoroughly to remove the  shampoo.                           4.  Use CHG as you would any other liquid soap.  You can apply chg directly  to the skin and wash                       Gently with a scrungie or clean washcloth.  5.  Apply the CHG Soap to your body ONLY FROM THE NECK DOWN.   Do not use on face/ open                           Wound or open sores. Avoid contact with eyes, ears mouth and genitals (private parts).                       Wash face,  Genitals (private parts) with your normal soap.             6.  Wash thoroughly, paying special attention to the area where your surgery  will be performed.  7.  Thoroughly rinse your body with warm water from the neck down.  8.  DO NOT  shower/wash with your normal soap after using and rinsing off  the CHG Soap.                9.  Pat yourself dry with a clean towel.            10.  Wear clean pajamas.            11.  Place clean sheets on your bed the night of your first shower and do not  sleep with pets. Day of Surgery : Do not apply any lotions/deodorants the morning of surgery.  Please wear clean clothes to the hospital/surgery center.  FAILURE TO FOLLOW THESE INSTRUCTIONS MAY RESULT IN THE CANCELLATION OF YOUR SURGERY PATIENT SIGNATURE_________________________________  NURSE SIGNATURE__________________________________  ________________________________________________________________________

## 2021-02-01 ENCOUNTER — Encounter (HOSPITAL_COMMUNITY): Payer: Self-pay

## 2021-02-01 ENCOUNTER — Other Ambulatory Visit (HOSPITAL_COMMUNITY): Payer: Self-pay

## 2021-02-01 ENCOUNTER — Encounter (HOSPITAL_COMMUNITY)
Admission: RE | Admit: 2021-02-01 | Discharge: 2021-02-01 | Disposition: A | Discharge status: Home / Self Care | Payer: Medicare Other | Source: Ambulatory Visit | Attending: Surgery | Admitting: Surgery

## 2021-02-01 ENCOUNTER — Encounter: Payer: Self-pay | Admitting: Internal Medicine

## 2021-02-01 ENCOUNTER — Other Ambulatory Visit: Payer: Self-pay

## 2021-02-01 DIAGNOSIS — Z01812 Encounter for preprocedural laboratory examination: Secondary | ICD-10-CM | POA: Insufficient documentation

## 2021-02-01 HISTORY — DX: Adverse effect of unspecified anesthetic, initial encounter: T41.45XA

## 2021-02-01 HISTORY — DX: Unspecified osteoarthritis, unspecified site: M19.90

## 2021-02-01 LAB — GLUCOSE, CAPILLARY: Glucose-Capillary: 103 mg/dL — ABNORMAL HIGH (ref 70–99)

## 2021-02-01 NOTE — Progress Notes (Signed)
PERIOPERATIVE PRESCRIPTION FOR IMPLANTED CARDIAC DEVICE PROGRAMMING   Patient Information: Name: [name], [name]  DOB: 1941/03/08  MRN: [id number]   Planned Procedure: Robot assist. Rectopexy.  Surgeon: Dr. Nadeen Landau  Date of Procedure: 02/09/21  Cautery will be used.  Position during surgery:    Please send documentation back to:  [facility] (Fax # [phone])   Joline Maxcy, RN  01/31/2021 4:04 PM     Device Information:   Clinic EP Physician:   Virl Axe, MD Device Type:  Pacemaker Manufacturer and Phone #:  Medtronic: [id number] Pacemaker Dependent?:  No Date of Last Device Check:  12/27/20     Normal Device Function?:  Yes     Electrophysiologist's Recommendations:    Have magnet available.  Provide continuous ECG monitoring when magnet is used or reprogramming is to be performed.   Procedure should not interfere with device function.  No device programming or magnet placement needed.  Per Device Clinic Standing Orders, Drake Leach  02/01/2021 4:30 PM

## 2021-02-01 NOTE — Progress Notes (Signed)
COVID Vaccine Completed: Yes Date COVID Vaccine completed: 09/27/19 [place] COVID vaccine manufacturer: Pfizer     PCP - Dr. Jonathon Jordan Cardiologist - Dr. Quillian Quince Bensimhon/Dr. Virl Axe.  Chest x-ray -  EKG - 06/29/20 EPIC Stress Test -  ECHO - 01/17/21  Cardiac Cath -  Pacemaker/ICD device last checked: 12/18/20  Sleep Study -  CPAP -   Fasting Blood Sugar -  Checks Blood Sugar _____ times a day  Blood Thinner Instructions: Aspirin Instructions: Last Dose:  Anesthesia review: Hx: CHF,DM,Lt. BBB.  Patient denies shortness of breath, fever, cough and chest pain at PAT appointment   Patient verbalized understanding of instructions that were given to them at the PAT appointment. Patient was also instructed that they will need to review over the PAT instructions again at home before surgery.

## 2021-02-02 ENCOUNTER — Other Ambulatory Visit (HOSPITAL_COMMUNITY): Payer: Self-pay | Admitting: Internal Medicine

## 2021-02-02 NOTE — Progress Notes (Signed)
Anesthesia Chart Review:   Case: [id number] Date/Time: 02/09/21 0815   Procedures:      XI ROBOT ASSISTED SUTURE  RECTOPEXY (N/A )     POSSIBLE FLEXIBLE SIGMOIDOSCOPY (N/A )     CYSTOSCOPY with FIREFLY INJECTION (Bilateral )   Anesthesia type: General   Pre-op diagnosis: full thickness rectal prolapse,   [facility] ROOM 02 / [facility]   Surgeons: Ileana Roup, MD; Lucas Mallow, MD      DISCUSSION: Pt is 80 years old with hx aortic stenosis s/p AVR with bioprosthetic valve (2014), CHB (post op AVR; s/p medtronic PPM inserted 2014), DM, CLL  Perioperative prescription for pacemaker:   Have magnet available.  Provide continuous ECG monitoring when magnet is used or reprogramming is to be performed.  Procedure should not interfere with device function.  No device programming or magnet placement needed.   VS: BP 127/63   Pulse 60   Temp 36.6 C (Oral)   Ht 5\' 2"  (1.575 m)   Wt 54 kg   SpO2 99%   BMI 21.77 kg/m   PROVIDERS: - PCP is Jonathon Jordan, MD - Cardiologist is Glori Bickers, MD., cleared for surgery at last office visit 01/06/21 - EP cardiologist is Virl Axe, MD. Last office visit 06/29/20 with Tommye Standard, PA  LABS:  Following labs are acceptable for surgery (from PCP's office, on paper chart) - CBC w/diff 01/31/21 showed RBC 4.09  - CMP 01/31/21 showed glucose 100, Cr 1.31, BUN 34  - HbA1c 01/31/21 was 6.0   Labs Reviewed  GLUCOSE, CAPILLARY - Abnormal; Notable for the following components:      Result Value   Glucose-Capillary 103 (*)    All other components within normal limits    EKG 06/29/20: AV paced   CV: Echo 01/17/21:  1. Apical hypokinesis with overall normal LV systolic function; s/p AVR with mean gradient 7 mmHg and trace AI.  2. Left ventricular ejection fraction, by estimation, is 55 to 60%. The left ventricle has normal function. The left ventricle demonstrates regional wall motion abnormalities (see scoring diagram/findings for  description). Left ventricular diastolic parameters are consistent with Grade I diastolic dysfunction (impaired relaxation).  3. Right ventricular systolic function is normal. The right ventricular size is normal.  4. Left atrial size was moderately dilated.  5. The mitral valve is normal in structure. Trivial mitral valve regurgitation. No evidence of mitral stenosis.  6. The aortic valve has been repaired/replaced. Aortic valve regurgitation is trivial. No aortic stenosis is present. There is a 23 mm Magna valve present in the aortic position. Procedure Date: 02/17/13.  7. The inferior vena cava is normal in size with greater than 50% respiratory variability, suggesting right atrial pressure of 3 mmHg.    Cardiac cath 12/25/12:  1. 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 Normal intracardiac pressures 3. Moderate to severe aortic stenosis   Past Medical History:  Diagnosis Date  . Adverse effect of anesthesia    Small airway  . Aortic stenosis    s/p AVR 6/14  . Arthritis   . Asthma   . Atrioventricular block, complete ([place])    a. s/p MDT dual chamber PPM 2014  . Cataract 09/2020   bilateral  . CHF (congestive heart failure) ([place])   . Chronic lymphocytic leukemia ([place])    followed by Dr. Ouida Sills  . Depression   . DM2 (diabetes  mellitus, type 2) ([facility])    fasting blood sugar - 90s  . Exposure to secondhand smoke    extensive exposure  . Heart murmur   . Hyperlipidemia    treated  . LBBB (left bundle branch block)   . Testicular cancer [facility])    s/p XRT and surgical resection.    Past Surgical History:  Procedure Laterality Date  . AORTIC VALVE REPLACEMENT N/A 02/17/2013   Procedure: AORTIC VALVE REPLACEMENT (AVR);  Surgeon: Gaye Pollack, MD;  Location: [place];  Service: Open Heart Surgery;  Laterality: N/A;  . CYST REMOVAL NECK    . EYE SURGERY    . INNER EAR SURGERY     . INTRAOPERATIVE TRANSESOPHAGEAL ECHOCARDIOGRAM N/A 02/17/2013   Procedure: INTRAOPERATIVE TRANSESOPHAGEAL ECHOCARDIOGRAM;  Surgeon: Gaye Pollack, MD;  Location[facility] OR;  Service: Open Heart Surgery;  Laterality: N/A;  . LEFT AND RIGHT HEART CATHETERIZATION WITH CORONARY ANGIOGRAM N/A 12/25/2012   Procedure: LEFT AND RIGHT HEART CATHETERIZATION WITH CORONARY ANGIOGRAM;  Surgeon: Jolaine Artist, MD;  Location: [facility] CATH LAB;  Service: Cardiovascular;  Laterality: N/A;  . PERMANENT PACEMAKER INSERTION N/A 02/19/2013   Procedure: PERMANENT PACEMAKER INSERTION;  Surgeon: Deboraha Sprang, MD;  Location: [facility] CATH LAB;  Service: Cardiovascular;  Laterality: N/A;  . RECTAL BIOPSY N/A 10/08/2017   Procedure: EXCISION OF ANAL POLYP ;  Surgeon: Ileana Roup, MD;  Location[facility];  Service: General;  Laterality: N/A;  . stapendectomy  07/30/02   right  . XRT and surgical resection     s/p. 10 years ago    MEDICATIONS: . acetaminophen (TYLENOL) 500 MG tablet  . aspirin 81 MG tablet  . busPIRone (BUSPAR) 15 MG tablet  . Cholecalciferol (VITAMIN D) 2000 units tablet  . cycloSPORINE (RESTASIS) 0.05 % ophthalmic emulsion  . fenofibrate 160 MG tablet  . fluticasone (FLONASE) 50 MCG/ACT nasal spray  . FORA V12 BLOOD GLUCOSE TEST test strip  . furosemide (LASIX) 40 MG tablet  . gabapentin (NEURONTIN) 300 MG capsule  . ketorolac (ACULAR) 0.5 % ophthalmic solution  . LITETOUCH LANCETS MISC  . montelukast (SINGULAIR) 10 MG tablet  . Multiple Vitamin (MULTIVITAMIN WITH MINERALS) TABS tablet  . Polyethyl Glycol-Propyl Glycol (SYSTANE OP)  . polyethylene glycol (MIRALAX / GLYCOLAX) 17 g packet  . potassium chloride SA (K-DUR,KLOR-CON) 20 MEQ tablet  . Probiotic Product ([place])  . sertraline (ZOLOFT) 100 MG tablet  . traZODone (DESYREL) 50 MG tablet   No current facility-administered medications for this encounter.    If no changes, I anticipate pt can proceed with surgery as  scheduled.   Willeen Cass, PhD, FNP-BC [facility]/Anesthesiology Phone: [phone] 02/02/2021 12:56 PM

## 2021-02-02 NOTE — Anesthesia Preprocedure Evaluation (Addendum)
Anesthesia Evaluation  Patient identified by MRN, date of birth, ID band Patient awake    Reviewed: Allergy & Precautions, NPO status , Patient's Chart, lab work & pertinent test results  History of Anesthesia Complications Negative for: history of anesthetic complications  Airway Mallampati: II  TM Distance: >3 FB Neck ROM: Full    Dental no notable dental hx.    Pulmonary asthma ,    Pulmonary exam normal        Cardiovascular +CHF  Normal cardiovascular exam+ dysrhythmias (3rd degree AVB) + pacemaker   Severe AS s/p AVR 2014  Echo 01/17/21: EF 55-60%, grade I DD, moderate LAE, trivial AR    Neuro/Psych negative neurological ROS  negative psych ROS   GI/Hepatic Neg liver ROS, full thickness rectal prolapse   Endo/Other  diabetes, Type 2  Renal/GU negative Renal ROS  negative genitourinary   Musculoskeletal  (+) Arthritis ,   Abdominal   Peds  Hematology CLL   Anesthesia Other Findings Day of surgery medications reviewed with patient.  Reproductive/Obstetrics negative OB ROS                           Anesthesia Physical Anesthesia Plan  ASA: III  Anesthesia Plan: General   Post-op Pain Management:    Induction: Intravenous  PONV Risk Score and Plan: 3 and Treatment may vary due to age or medical condition, Ondansetron and Dexamethasone  Airway Management Planned: Oral ETT and Video Laryngoscope Planned  Additional Equipment:   Intra-op Plan:   Post-operative Plan: Extubation in OR  Informed Consent: I have reviewed the patients History and Physical, chart, labs and discussed the procedure including the risks, benefits and alternatives for the proposed anesthesia with the patient or authorized representative who has indicated his/her understanding and acceptance.     Dental advisory given  Plan Discussed with: CRNA  Anesthesia Plan Comments:       Anesthesia Quick  Evaluation

## 2021-02-03 ENCOUNTER — Other Ambulatory Visit (INDEPENDENT_AMBULATORY_CARE_PROVIDER_SITE_OTHER): Payer: Self-pay | Admitting: Ophthalmology

## 2021-02-03 MED ORDER — KETOROLAC TROMETHAMINE 0.5 % OP SOLN: 1.0000 [drp] | Freq: Four times a day (QID) | OPHTHALMIC | 12 refills | Status: DC

## 2021-02-04 ENCOUNTER — Other Ambulatory Visit: Payer: Self-pay

## 2021-02-07 ENCOUNTER — Other Ambulatory Visit (HOSPITAL_COMMUNITY)
Admission: RE | Admit: 2021-02-07 | Discharge: 2021-02-07 | Disposition: A | Discharge status: Home / Self Care | Payer: Medicare Other | Source: Ambulatory Visit | Attending: Surgery | Admitting: Surgery

## 2021-02-07 ENCOUNTER — Encounter (INDEPENDENT_AMBULATORY_CARE_PROVIDER_SITE_OTHER): Payer: Self-pay | Admitting: Ophthalmology

## 2021-02-07 ENCOUNTER — Other Ambulatory Visit: Payer: Self-pay

## 2021-02-07 ENCOUNTER — Ambulatory Visit (INDEPENDENT_AMBULATORY_CARE_PROVIDER_SITE_OTHER): Payer: Medicare Other | Admitting: Ophthalmology

## 2021-02-07 DIAGNOSIS — H35351 Cystoid macular degeneration, right eye: Secondary | ICD-10-CM | POA: Diagnosis not present

## 2021-02-07 DIAGNOSIS — Z20822 Contact with and (suspected) exposure to covid-19: Secondary | ICD-10-CM | POA: Insufficient documentation

## 2021-02-07 DIAGNOSIS — H4321 Crystalline deposits in vitreous body, right eye: Secondary | ICD-10-CM

## 2021-02-07 DIAGNOSIS — H35371 Puckering of macula, right eye: Secondary | ICD-10-CM | POA: Diagnosis not present

## 2021-02-07 DIAGNOSIS — E538 Deficiency of other specified B group vitamins: Secondary | ICD-10-CM | POA: Diagnosis not present

## 2021-02-07 DIAGNOSIS — Z01812 Encounter for preprocedural laboratory examination: Secondary | ICD-10-CM | POA: Insufficient documentation

## 2021-02-07 LAB — SARS CORONAVIRUS 2 (TAT 6-24 HRS): SARS Coronavirus 2: NEGATIVE

## 2021-02-07 NOTE — Progress Notes (Signed)
02/07/2021     CHIEF COMPLAINT Patient presents for Retina Follow Up (10 Wk F/U OD//Pt sts VA OD is improving slightly. Pt sts, "it's not as blurry as it may have been way back." VA OS stable.)   HISTORY OF PRESENT ILLNESS: [name] is a 80 y.o. male who presents to the clinic today for:   HPI     Retina Follow Up           Diagnosis: Other   Laterality: right eye   Onset: 10 weeks ago   Severity: moderate   Duration: 10 weeks   Course: gradually improving   Comments: 10 Wk F/U OD  Pt sts VA OD is improving slightly. Pt sts, "it's not as blurry as it may have been way back." VA OS stable.       Last edited by Rockie Neighbours, [place] on 02/07/2021  1:53 PM.      Referring physician: Jonathon Jordan, MD [facility] [address],  [place] [address]  HISTORICAL INFORMATION:   Selected notes from the MEDICAL RECORD NUMBER    Lab Results  Component Value Date   HGBA1C 6.1 (H) 10/04/2017     CURRENT MEDICATIONS: Current Outpatient Medications (Ophthalmic Drugs)  Medication Sig   ketorolac (ACULAR) 0.5 % ophthalmic solution Place 1 drop into the right eye 4 (four) times daily.   cycloSPORINE (RESTASIS) 0.05 % ophthalmic emulsion Place 1 drop into both eyes 2 (two) times daily.   Polyethyl Glycol-Propyl Glycol (SYSTANE OP) Place 1 drop into both eyes 2 (two) times daily.   No current facility-administered medications for this visit. (Ophthalmic Drugs)   Current Outpatient Medications (Other)  Medication Sig   acetaminophen (TYLENOL) 500 MG tablet Take 500 mg by mouth 3 (three) times daily.   aspirin 81 MG tablet Take 1 tablet (81 mg total) by mouth daily.   busPIRone (BUSPAR) 15 MG tablet Take 15-30 mg by mouth See admin instructions. Take 15 mg by mouth every morning and 30 mg every day at noon   Cholecalciferol (VITAMIN D) 2000 units tablet Take 2,000 Units by mouth every other day.    fenofibrate 160 MG tablet Take 160 mg by mouth daily.    fluticasone (FLONASE) 50 MCG/ACT nasal spray Place 2 sprays into the nose daily.   FORA V12 BLOOD GLUCOSE TEST test strip    furosemide (LASIX) 40 MG tablet TAKE ONE TABLET TWICE DAILY   gabapentin (NEURONTIN) 300 MG capsule Take 300-600 mg by mouth See admin instructions. Take 300 mg by mouth each morning and 600 mg each afternoon.   LITETOUCH LANCETS MISC    montelukast (SINGULAIR) 10 MG tablet Take 10 mg by mouth daily.   Multiple Vitamin (MULTIVITAMIN WITH MINERALS) TABS tablet Take 1 tablet by mouth daily.   polyethylene glycol (MIRALAX / GLYCOLAX) 17 g packet Take 17 g by mouth daily.   potassium chloride SA (K-DUR,KLOR-CON) 20 MEQ tablet TAKE ONE TABLET EVERY DAY (Patient taking differently: Take 20 mEq by mouth daily.)   Probiotic Product (PHILLIPS COLON HEALTH PO) Take 1 capsule by mouth daily.   sertraline (ZOLOFT) 100 MG tablet Take 100 mg by mouth daily.   traZODone (DESYREL) 50 MG tablet Take 150 mg by mouth at bedtime.   No current facility-administered medications for this visit. (Other)      REVIEW OF SYSTEMS:    ALLERGIES Allergies  Allergen Reactions   Banana Anaphylaxis   Penicillins Rash    Has patient had  a PCN reaction causing immediate rash, facial/tongue/throat swelling, SOB or lightheadedness with hypotension: Unknown Has patient had a PCN reaction causing severe rash involving mucus membranes or skin necrosis: Unknown Has patient had a PCN reaction that required hospitalization: No Has patient had a PCN reaction occurring within the last 10 years: No If all of the above answers are "NO", then may proceed with Cephalosporin use.     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   Past Surgical History:  Procedure Laterality Date   AORTIC VALVE REPLACEMENT N/A 02/17/2013   Procedure: AORTIC VALVE REPLACEMENT (AVR);  Surgeon: Gaye Pollack, MD;  Location: [place];  Service: Open Heart Surgery;  Laterality: N/A;   CYST REMOVAL NECK     EYE SURGERY     INNER EAR SURGERY     INTRAOPERATIVE TRANSESOPHAGEAL ECHOCARDIOGRAM N/A 02/17/2013   Procedure: INTRAOPERATIVE TRANSESOPHAGEAL ECHOCARDIOGRAM;  Surgeon: Gaye Pollack, MD;  Location: [facility] OR;  Service: Open Heart Surgery;  Laterality: N/A;   LEFT AND RIGHT HEART CATHETERIZATION WITH CORONARY ANGIOGRAM N/A 12/25/2012   Procedure: LEFT AND RIGHT HEART CATHETERIZATION WITH CORONARY ANGIOGRAM;  Surgeon: Jolaine Artist, MD;  Location: [facility] CATH LAB;  Service: Cardiovascular;  Laterality: N/A;   PERMANENT PACEMAKER INSERTION N/A 02/19/2013   Procedure: PERMANENT PACEMAKER INSERTION;  Surgeon: Deboraha Sprang, MD;  Location: [facility] CATH LAB;  Service: Cardiovascular;  Laterality: N/A;   RECTAL BIOPSY N/A 10/08/2017   Procedure: EXCISION OF ANAL POLYP ;  Surgeon: Ileana Roup, MD;  Location: [facility] ORS;  Service: General;  Laterality: N/A;   stapendectomy  07/30/02   right   XRT and surgical resection     s/p. 10 years ago    FAMILY HISTORY Family History  Problem Relation Age of Onset   Brain cancer Mother    Cancer Father        lung    SOCIAL HISTORY Social History   Tobacco Use   Smoking status: Never   Smokeless tobacco: Never  Vaping Use   Vaping Use: Never used  Substance Use Topics   Alcohol use: No   Drug use: No         OPHTHALMIC EXAM:  Base Eye Exam      Visual Acuity (ETDRS)       Right Left   Dist cc 20/20 -1 20/20 -2    Correction: Glasses         Tonometry (Tonopen, 1:57 PM)       Right Left   Pressure 11 09         Pupils       Pupils Dark Light Shape React APD   Right PERRL  6 5 Round Slow None   Left PERRL 6 5 Round Slow None         Visual Fields (Counting fingers)       Left Right    Full Full         Extraocular Movement       Right Left    Full Full         Neuro/Psych     Oriented x3: Yes   Mood/Affect: Normal         Dilation     Right eye: 1.0% Mydriacyl, 2.5% Phenylephrine @ 1:57 PM           Slit Lamp and Fundus Exam     External Exam       Right Left   External Normal Normal         Slit Lamp Exam       Right Left   Lids/Lashes Normal Normal   Conjunctiva/Sclera White and quiet White and quiet   Cornea Clear Clear   Anterior Chamber Deep and quiet Deep and quiet   Iris Round and reactive Round and reactive   Lens Centered posterior chamber intraocular lens Centered posterior chamber intraocular lens   Anterior Vitreous Asteroid hyalosis Normal         Fundus Exam       Right Left   Posterior Vitreous Asteroid hyalosis 2+    Disc Peripapillary atrophy Not dilated   C/D Ratio 0.4    Macula Epiretinal membrane    Vessels Normal    Periphery Normal             IMAGING AND PROCEDURES  Imaging and Procedures for 02/07/21  OCT, Retina - OU - Both Eyes       Right Eye Quality was good. Scan locations included subfoveal. Central Foveal Thickness: 396. Progression has no prior data. Findings include abnormal foveal contour, cystoid macular edema, epiretinal membrane, vitreomacular adhesion .   Left Eye Quality was good. Scan locations included subfoveal. Central Foveal Thickness: 297. Progression has no prior data. Findings include vitreomacular adhesion , normal foveal contour.   Notes CME with overlying epiretinal membrane OD, with elevation  of the foveal depression as if the epiretinal membrane is a component of visual difficulty and or en face vitreomacular adhesion  OS with incidental VMA, no topographic distortions             ASSESSMENT/PLAN:  Asteroid hyalosis of right eye Patient has to self monitor at home for the floaters  Macular pucker, right eye Epiretinal membrane with some blurring per patient symptoms persists, even with new prescription.  E with 20/20 vision in the right eye, patient persist with symptomatology consistent with epiretinal membrane in the right eye.  Cystoid macular edema, right eye , No CME remains today on topical Nonsteroidals, will attempt to hold use by decreasing to twice daily for 2 weeks and then discontinuing     ICD-10-CM   1. Cystoid macular edema, right eye  H35.351 OCT, Retina - OU - Both Eyes    2. Asteroid hyalosis of right eye  H43.21     3. Macular pucker, right eye  H35.371       1.  Patient instructed to monocular testing of visual acuity at home with reading glasses on it near work which is the easiest and most sensitive way to determine whether epiretinal membrane is still triggering symptomatology.  If so the right I can undergo vitrectomy to her secondarily  remove vitreous floaters, asteroid hyalosis but also release the macular tugging traction and thickening from epiretinal membrane  2.  Patient will taper the topical nonsteroidal medication ketorolac to twice daily for 2 weeks and follow-up in 6 weeks for dilate examination right eye alone  3.  Ophthalmic Meds Ordered this visit:  No orders of the defined types were placed in this encounter.      Return in about 6 weeks (around 03/21/2021) for dilate, OD, OCT.  There are no Patient Instructions on file for this visit.   Explained the diagnoses, plan, and follow up with the patient and they expressed understanding.  Patient expressed understanding of the importance of proper follow up care.   Clent Demark Raymond Azure M.D. Diseases & Surgery of the Retina and Vitreous [facility]ina [facility] [place] 02/07/21     Abbreviations: M myopia (nearsighted); A astigmatism; H hyperopia (farsighted); P presbyopia; Mrx spectacle prescription;  CTL contact lenses; OD right eye; OS left eye; OU both eyes  XT exotropia; ET esotropia; PEK punctate epithelial keratitis; PEE punctate epithelial erosions; DES dry eye syndrome; MGD meibomian gland dysfunction; ATs artificial tears; PFAT's preservative free artificial tears; [place] nuclear sclerotic cataract; PSC posterior subcapsular cataract; ERM epi-retinal membrane; PVD posterior vitreous detachment; RD retinal detachment; DM diabetes mellitus; DR diabetic retinopathy; NPDR non-proliferative diabetic retinopathy; PDR proliferative diabetic retinopathy; CSME clinically significant macular edema; DME diabetic macular edema; dbh dot blot hemorrhages; CWS cotton wool spot; POAG primary open angle glaucoma; C/D cup-to-disc ratio; HVF humphrey visual field; GVF goldmann visual field; OCT optical coherence tomography; IOP intraocular pressure; BRVO Branch retinal vein occlusion; CRVO central retinal vein occlusion; CRAO central retinal artery occlusion; BRAO branch retinal artery occlusion; RT retinal tear; SB scleral buckle; PPV pars plana vitrectomy; VH Vitreous hemorrhage; PRP panretinal laser photocoagulation; IVK intravitreal kenalog; VMT vitreomacular traction; MH Macular hole;  NVD neovascularization of the disc; NVE neovascularization elsewhere; AREDS age related eye disease study; ARMD age related macular degeneration; POAG primary open angle glaucoma; EBMD epithelial/anterior basement membrane dystrophy; ACIOL anterior chamber intraocular lens; IOL intraocular lens; PCIOL posterior chamber intraocular lens; Phaco/IOL phacoemulsification with intraocular lens placement; [place] photorefractive keratectomy; LASIK laser assisted in situ keratomileusis; HTN hypertension; DM diabetes  mellitus; COPD chronic obstructive pulmonary disease

## 2021-02-07 NOTE — Assessment & Plan Note (Signed)
,   No CME remains today on topical Nonsteroidals, will attempt to hold use by decreasing to twice daily for 2 weeks and then discontinuing

## 2021-02-07 NOTE — Assessment & Plan Note (Signed)
Patient has to self monitor at home for the floaters

## 2021-02-07 NOTE — Assessment & Plan Note (Addendum)
Epiretinal membrane with some blurring per patient symptoms persists, even with new prescription.  E with 20/20 vision in the right eye, patient persist with symptomatology consistent with epiretinal membrane in the right eye.

## 2021-02-09 ENCOUNTER — Encounter (HOSPITAL_COMMUNITY): Payer: Self-pay | Admitting: Surgery

## 2021-02-09 ENCOUNTER — Other Ambulatory Visit: Payer: Self-pay

## 2021-02-09 ENCOUNTER — Inpatient Hospital Stay (HOSPITAL_COMMUNITY): Payer: Medicare Other | Admitting: Anesthesiology

## 2021-02-09 ENCOUNTER — Inpatient Hospital Stay (HOSPITAL_COMMUNITY)
Admission: RE | Admit: 2021-02-09 | Discharge: 2021-02-12 | DRG: 330 | Disposition: A | Discharge status: Home / Self Care | Payer: Medicare Other | Attending: Surgery | Admitting: Surgery

## 2021-02-09 ENCOUNTER — Encounter (HOSPITAL_COMMUNITY)
Admission: RE | Disposition: A | Discharge status: Home / Self Care | Payer: Self-pay | Source: Home / Self Care | Attending: Surgery

## 2021-02-09 ENCOUNTER — Inpatient Hospital Stay (HOSPITAL_COMMUNITY): Payer: Medicare Other | Admitting: Emergency Medicine

## 2021-02-09 DIAGNOSIS — I5032 Chronic diastolic (congestive) heart failure: Secondary | ICD-10-CM | POA: Diagnosis present

## 2021-02-09 DIAGNOSIS — E785 Hyperlipidemia, unspecified: Secondary | ICD-10-CM | POA: Diagnosis not present

## 2021-02-09 DIAGNOSIS — Y842 Radiological procedure and radiotherapy as the cause of abnormal reaction of the patient, or of later complication, without mention of misadventure at the time of the procedure: Secondary | ICD-10-CM | POA: Diagnosis present

## 2021-02-09 DIAGNOSIS — Z20822 Contact with and (suspected) exposure to covid-19: Secondary | ICD-10-CM | POA: Diagnosis not present

## 2021-02-09 DIAGNOSIS — I110 Hypertensive heart disease with heart failure: Secondary | ICD-10-CM | POA: Diagnosis not present

## 2021-02-09 DIAGNOSIS — T884XXA Failed or difficult intubation, initial encounter: Secondary | ICD-10-CM

## 2021-02-09 DIAGNOSIS — Z950 Presence of cardiac pacemaker: Secondary | ICD-10-CM

## 2021-02-09 DIAGNOSIS — K660 Peritoneal adhesions (postprocedural) (postinfection): Secondary | ICD-10-CM | POA: Diagnosis present

## 2021-02-09 DIAGNOSIS — Z952 Presence of prosthetic heart valve: Secondary | ICD-10-CM

## 2021-02-09 DIAGNOSIS — F32A Depression, unspecified: Secondary | ICD-10-CM | POA: Diagnosis not present

## 2021-02-09 DIAGNOSIS — I442 Atrioventricular block, complete: Secondary | ICD-10-CM | POA: Diagnosis not present

## 2021-02-09 DIAGNOSIS — Z8547 Personal history of malignant neoplasm of testis: Secondary | ICD-10-CM

## 2021-02-09 DIAGNOSIS — K623 Rectal prolapse: Secondary | ICD-10-CM | POA: Diagnosis not present

## 2021-02-09 DIAGNOSIS — N3289 Other specified disorders of bladder: Secondary | ICD-10-CM | POA: Diagnosis not present

## 2021-02-09 HISTORY — DX: Failed or difficult intubation, initial encounter: T88.4XXA

## 2021-02-09 HISTORY — PX: XI ROBOT ASSISTED RECTOPEXY: SHX6788

## 2021-02-09 LAB — CBC WITH DIFFERENTIAL/PLATELET
Abs Immature Granulocytes: 0.02 10*3/uL (ref 0.00–0.07)
Basophils Absolute: 0 10*3/uL (ref 0.0–0.1)
Basophils Relative: 1 %
Eosinophils Absolute: 0.1 10*3/uL (ref 0.0–0.5)
Eosinophils Relative: 1 %
HCT: 41.2 % (ref 39.0–52.0)
Hemoglobin: 13.8 g/dL (ref 13.0–17.0)
Immature Granulocytes: 0 %
Lymphocytes Relative: 41 %
Lymphs Abs: 2.8 10*3/uL (ref 0.7–4.0)
MCH: 33.3 pg (ref 26.0–34.0)
MCHC: 33.5 g/dL (ref 30.0–36.0)
MCV: 99.3 fL (ref 80.0–100.0)
Monocytes Absolute: 0.5 10*3/uL (ref 0.1–1.0)
Monocytes Relative: 7 %
Neutro Abs: 3.4 10*3/uL (ref 1.7–7.7)
Neutrophils Relative %: 50 %
Platelets: 194 10*3/uL (ref 150–400)
RBC: 4.15 MIL/uL — ABNORMAL LOW (ref 4.22–5.81)
RDW: 11.9 % (ref 11.5–15.5)
WBC: 6.8 10*3/uL (ref 4.0–10.5)
nRBC: 0 % (ref 0.0–0.2)

## 2021-02-09 LAB — GLUCOSE, CAPILLARY
Glucose-Capillary: 89 mg/dL (ref 70–99)
Glucose-Capillary: 165 mg/dL — ABNORMAL HIGH (ref 70–99)
Glucose-Capillary: 189 mg/dL — ABNORMAL HIGH (ref 70–99)
Glucose-Capillary: 138 mg/dL — ABNORMAL HIGH (ref 70–99)

## 2021-02-09 LAB — TYPE AND SCREEN
ABO/RH(D): A NEG
Antibody Screen: NEGATIVE

## 2021-02-09 SURGERY — RECTOPEXY, ROBOT-ASSISTED
Anesthesia: General | Site: Perineum

## 2021-02-09 MED ORDER — ASPIRIN EC 81 MG PO TBEC
81.0000 mg | DELAYED_RELEASE_TABLET | Freq: Every day | ORAL | Status: DC
  Administered 2021-02-10 – 2021-02-12 (×3): 81 mg via ORAL
  Filled 2021-02-09 (×3): qty 1

## 2021-02-09 MED ORDER — STERILE WATER FOR INJECTION IJ SOLN
INTRAMUSCULAR | Status: AC
  Filled 2021-02-09: qty 10

## 2021-02-09 MED ORDER — HYDRALAZINE HCL 20 MG/ML IJ SOLN: 10.0000 mg | INTRAMUSCULAR | Status: DC | PRN

## 2021-02-09 MED ORDER — LACTATED RINGERS IV SOLN: INTRAVENOUS | Status: DC

## 2021-02-09 MED ORDER — DEXAMETHASONE SODIUM PHOSPHATE 10 MG/ML IJ SOLN
INTRAMUSCULAR | Status: AC
  Filled 2021-02-09: qty 1

## 2021-02-09 MED ORDER — ALVIMOPAN 12 MG PO CAPS
12.0000 mg | ORAL_CAPSULE | ORAL | Status: AC
  Administered 2021-02-09: 12 mg via ORAL
  Filled 2021-02-09: qty 1

## 2021-02-09 MED ORDER — ALUM & MAG HYDROXIDE-SIMETH 200-200-20 MG/5ML PO SUSP: 30.0000 mL | Freq: Four times a day (QID) | ORAL | Status: DC | PRN

## 2021-02-09 MED ORDER — BISACODYL 5 MG PO TBEC: 20.0000 mg | DELAYED_RELEASE_TABLET | Freq: Once | ORAL | Status: DC

## 2021-02-09 MED ORDER — OXYCODONE HCL 5 MG/5ML PO SOLN: 5.0000 mg | Freq: Once | ORAL | Status: DC | PRN

## 2021-02-09 MED ORDER — DEXAMETHASONE SODIUM PHOSPHATE 10 MG/ML IJ SOLN
INTRAMUSCULAR | Status: DC | PRN
  Administered 2021-02-09: 6 mg via INTRAVENOUS

## 2021-02-09 MED ORDER — KETOROLAC TROMETHAMINE 0.5 % OP SOLN
1.0000 [drp] | Freq: Four times a day (QID) | OPHTHALMIC | Status: DC
  Administered 2021-02-09 – 2021-02-12 (×10): 1 [drp] via OPHTHALMIC
  Filled 2021-02-09: qty 5

## 2021-02-09 MED ORDER — BUSPIRONE HCL 5 MG PO TABS: 15.0000 mg | ORAL_TABLET | ORAL | Status: DC

## 2021-02-09 MED ORDER — LIDOCAINE 2% (20 MG/ML) 5 ML SYRINGE
INTRAMUSCULAR | Status: AC
  Filled 2021-02-09: qty 15

## 2021-02-09 MED ORDER — ACETAMINOPHEN 500 MG PO TABS
1000.0000 mg | ORAL_TABLET | ORAL | Status: AC
  Administered 2021-02-09: 1000 mg via ORAL
  Filled 2021-02-09: qty 2

## 2021-02-09 MED ORDER — GABAPENTIN 300 MG PO CAPS
600.0000 mg | ORAL_CAPSULE | Freq: Every day | ORAL | Status: DC
  Administered 2021-02-09 – 2021-02-11 (×3): 600 mg via ORAL
  Filled 2021-02-09 (×3): qty 2

## 2021-02-09 MED ORDER — BUSPIRONE HCL 5 MG PO TABS
15.0000 mg | ORAL_TABLET | Freq: Every day | ORAL | Status: DC
  Administered 2021-02-10 – 2021-02-12 (×3): 15 mg via ORAL
  Filled 2021-02-09 (×3): qty 3

## 2021-02-09 MED ORDER — SERTRALINE HCL 100 MG PO TABS
100.0000 mg | ORAL_TABLET | Freq: Every day | ORAL | Status: DC
  Administered 2021-02-10 – 2021-02-12 (×3): 100 mg via ORAL
  Filled 2021-02-09 (×3): qty 1

## 2021-02-09 MED ORDER — FENOFIBRATE 160 MG PO TABS
160.0000 mg | ORAL_TABLET | Freq: Every day | ORAL | Status: DC
  Administered 2021-02-10 – 2021-02-12 (×3): 160 mg via ORAL
  Filled 2021-02-09 (×3): qty 1

## 2021-02-09 MED ORDER — SUGAMMADEX SODIUM 200 MG/2ML IV SOLN
INTRAVENOUS | Status: DC | PRN
  Administered 2021-02-09: 110 mg via INTRAVENOUS

## 2021-02-09 MED ORDER — KETAMINE HCL 10 MG/ML IJ SOLN
INTRAMUSCULAR | Status: AC
  Filled 2021-02-09: qty 1

## 2021-02-09 MED ORDER — CHLORHEXIDINE GLUCONATE CLOTH 2 % EX PADS: 6.0000 | MEDICATED_PAD | Freq: Once | CUTANEOUS | Status: DC

## 2021-02-09 MED ORDER — HYDROMORPHONE HCL 1 MG/ML IJ SOLN
0.5000 mg | INTRAMUSCULAR | Status: DC | PRN
Start: 2021-02-09 — End: 2021-02-12

## 2021-02-09 MED ORDER — INSULIN ASPART 100 UNIT/ML IJ SOLN: 0.0000 [IU] | Freq: Every day | INTRAMUSCULAR | Status: DC

## 2021-02-09 MED ORDER — SODIUM CHLORIDE 0.9 % IR SOLN
Status: DC | PRN
  Administered 2021-02-09: 1000 mL

## 2021-02-09 MED ORDER — CYCLOSPORINE 0.05 % OP EMUL
1.0000 [drp] | Freq: Two times a day (BID) | OPHTHALMIC | Status: DC
  Administered 2021-02-09 – 2021-02-12 (×6): 1 [drp] via OPHTHALMIC
  Filled 2021-02-09 (×6): qty 1

## 2021-02-09 MED ORDER — HEPARIN SODIUM (PORCINE) 5000 UNIT/ML IJ SOLN
5000.0000 [IU] | Freq: Once | INTRAMUSCULAR | Status: AC
  Administered 2021-02-09: 5000 [IU] via SUBCUTANEOUS
  Filled 2021-02-09: qty 1

## 2021-02-09 MED ORDER — PROPOFOL 10 MG/ML IV BOLUS
INTRAVENOUS | Status: DC | PRN
  Administered 2021-02-09: 90 mg via INTRAVENOUS

## 2021-02-09 MED ORDER — DIPHENHYDRAMINE HCL 50 MG/ML IJ SOLN: 12.5000 mg | Freq: Four times a day (QID) | INTRAMUSCULAR | Status: DC | PRN

## 2021-02-09 MED ORDER — BUPIVACAINE-EPINEPHRINE (PF) 0.25% -1:200000 IJ SOLN
INTRAMUSCULAR | Status: AC
  Filled 2021-02-09: qty 30

## 2021-02-09 MED ORDER — LIDOCAINE 2% (20 MG/ML) 5 ML SYRINGE
INTRAMUSCULAR | Status: DC | PRN
  Administered 2021-02-09: 1 mg/kg/h via INTRAVENOUS

## 2021-02-09 MED ORDER — INSULIN ASPART 100 UNIT/ML IJ SOLN
0.0000 [IU] | Freq: Three times a day (TID) | INTRAMUSCULAR | Status: DC
  Administered 2021-02-09 – 2021-02-10 (×2): 1 [IU] via SUBCUTANEOUS

## 2021-02-09 MED ORDER — PROPOFOL 10 MG/ML IV BOLUS
INTRAVENOUS | Status: AC
  Filled 2021-02-09: qty 20

## 2021-02-09 MED ORDER — ENSURE SURGERY PO LIQD
237.0000 mL | Freq: Two times a day (BID) | ORAL | Status: DC
  Administered 2021-02-09 – 2021-02-10 (×3): 237 mL via ORAL

## 2021-02-09 MED ORDER — ONDANSETRON HCL 4 MG/2ML IJ SOLN
INTRAMUSCULAR | Status: DC | PRN
  Administered 2021-02-09: 4 mg via INTRAVENOUS

## 2021-02-09 MED ORDER — LIDOCAINE 2% (20 MG/ML) 5 ML SYRINGE
INTRAMUSCULAR | Status: DC | PRN
  Administered 2021-02-09: 60 mg via INTRAVENOUS

## 2021-02-09 MED ORDER — POTASSIUM CHLORIDE CRYS ER 20 MEQ PO TBCR
20.0000 meq | EXTENDED_RELEASE_TABLET | Freq: Every day | ORAL | Status: DC
  Administered 2021-02-10 – 2021-02-12 (×3): 20 meq via ORAL
  Filled 2021-02-09 (×3): qty 1

## 2021-02-09 MED ORDER — ORAL CARE MOUTH RINSE
15.0000 mL | Freq: Once | OROMUCOSAL | Status: AC
  Administered 2021-02-09: 15 mL via OROMUCOSAL

## 2021-02-09 MED ORDER — 0.9 % SODIUM CHLORIDE (POUR BTL) OPTIME
TOPICAL | Status: DC | PRN
  Administered 2021-02-09 (×2): 1000 mL

## 2021-02-09 MED ORDER — PHENYLEPHRINE 40 MCG/ML (10ML) SYRINGE FOR IV PUSH (FOR BLOOD PRESSURE SUPPORT)
PREFILLED_SYRINGE | INTRAVENOUS | Status: DC | PRN
  Administered 2021-02-09: 80 ug via INTRAVENOUS

## 2021-02-09 MED ORDER — FENTANYL CITRATE (PF) 100 MCG/2ML IJ SOLN: 25.0000 ug | INTRAMUSCULAR | Status: DC | PRN

## 2021-02-09 MED ORDER — PHENYLEPHRINE HCL-NACL 10-0.9 MG/250ML-% IV SOLN
INTRAVENOUS | Status: DC | PRN
  Administered 2021-02-09: 20 ug/min via INTRAVENOUS

## 2021-02-09 MED ORDER — ONDANSETRON HCL 4 MG/2ML IJ SOLN: 4.0000 mg | Freq: Four times a day (QID) | INTRAMUSCULAR | Status: DC | PRN

## 2021-02-09 MED ORDER — ONDANSETRON HCL 4 MG PO TABS: 4.0000 mg | ORAL_TABLET | Freq: Four times a day (QID) | ORAL | Status: DC | PRN

## 2021-02-09 MED ORDER — ROCURONIUM BROMIDE 10 MG/ML (PF) SYRINGE
PREFILLED_SYRINGE | INTRAVENOUS | Status: AC
  Filled 2021-02-09: qty 10

## 2021-02-09 MED ORDER — SIMETHICONE 80 MG PO CHEW: 40.0000 mg | CHEWABLE_TABLET | Freq: Four times a day (QID) | ORAL | Status: DC | PRN

## 2021-02-09 MED ORDER — POLYETHYLENE GLYCOL 3350 17 G PO PACK
17.0000 g | PACK | Freq: Every day | ORAL | Status: DC
  Administered 2021-02-09 – 2021-02-10 (×2): 17 g via ORAL
  Filled 2021-02-09 (×4): qty 1

## 2021-02-09 MED ORDER — DIPHENHYDRAMINE HCL 12.5 MG/5ML PO ELIX: 12.5000 mg | ORAL_SOLUTION | Freq: Four times a day (QID) | ORAL | Status: DC | PRN

## 2021-02-09 MED ORDER — BUSPIRONE HCL 5 MG PO TABS
30.0000 mg | ORAL_TABLET | Freq: Every day | ORAL | Status: DC
  Administered 2021-02-09 – 2021-02-12 (×4): 30 mg via ORAL
  Filled 2021-02-09 (×4): qty 6

## 2021-02-09 MED ORDER — SODIUM CHLORIDE 0.9 % IV SOLN
2.0000 g | INTRAVENOUS | Status: AC
  Administered 2021-02-09: 2 g via INTRAVENOUS
  Filled 2021-02-09: qty 2

## 2021-02-09 MED ORDER — LIDOCAINE 2% (20 MG/ML) 5 ML SYRINGE
INTRAMUSCULAR | Status: AC
  Filled 2021-02-09: qty 5

## 2021-02-09 MED ORDER — FENTANYL CITRATE (PF) 250 MCG/5ML IJ SOLN
INTRAMUSCULAR | Status: AC
  Filled 2021-02-09: qty 5

## 2021-02-09 MED ORDER — GABAPENTIN 300 MG PO CAPS
300.0000 mg | ORAL_CAPSULE | Freq: Every day | ORAL | Status: DC
  Administered 2021-02-10 – 2021-02-12 (×3): 300 mg via ORAL
  Filled 2021-02-09 (×3): qty 1

## 2021-02-09 MED ORDER — AMISULPRIDE (ANTIEMETIC) 5 MG/2ML IV SOLN: 10.0000 mg | Freq: Once | INTRAVENOUS | Status: DC | PRN

## 2021-02-09 MED ORDER — SODIUM CHLORIDE (PF) 0.9 % IJ SOLN
INTRAMUSCULAR | Status: AC
  Filled 2021-02-09: qty 10

## 2021-02-09 MED ORDER — OXYCODONE HCL 5 MG PO TABS: 5.0000 mg | ORAL_TABLET | Freq: Once | ORAL | Status: DC | PRN

## 2021-02-09 MED ORDER — BUPIVACAINE LIPOSOME 1.3 % IJ SUSP
20.0000 mL | Freq: Once | INTRAMUSCULAR | Status: DC
  Filled 2021-02-09: qty 20

## 2021-02-09 MED ORDER — TRAMADOL HCL 50 MG PO TABS
50.0000 mg | ORAL_TABLET | Freq: Four times a day (QID) | ORAL | Status: DC | PRN
  Administered 2021-02-10 – 2021-02-11 (×3): 50 mg via ORAL
  Filled 2021-02-09 (×3): qty 1

## 2021-02-09 MED ORDER — BUPIVACAINE LIPOSOME 1.3 % IJ SUSP
INTRAMUSCULAR | Status: DC | PRN
  Administered 2021-02-09: 20 mL

## 2021-02-09 MED ORDER — FLUTICASONE PROPIONATE 50 MCG/ACT NA SUSP
2.0000 | Freq: Every day | NASAL | Status: DC
  Administered 2021-02-10 – 2021-02-12 (×3): 2 via NASAL
  Filled 2021-02-09: qty 16

## 2021-02-09 MED ORDER — MONTELUKAST SODIUM 10 MG PO TABS
10.0000 mg | ORAL_TABLET | Freq: Every day | ORAL | Status: DC
  Administered 2021-02-10 – 2021-02-11 (×2): 10 mg via ORAL
  Filled 2021-02-09 (×2): qty 1

## 2021-02-09 MED ORDER — PHENYLEPHRINE HCL (PRESSORS) 10 MG/ML IV SOLN
INTRAVENOUS | Status: AC
  Filled 2021-02-09: qty 1

## 2021-02-09 MED ORDER — CHLORHEXIDINE GLUCONATE 0.12 % MT SOLN: 15.0000 mL | Freq: Once | OROMUCOSAL | Status: AC

## 2021-02-09 MED ORDER — HEPARIN SODIUM (PORCINE) 5000 UNIT/ML IJ SOLN
5000.0000 [IU] | Freq: Three times a day (TID) | INTRAMUSCULAR | Status: DC
  Administered 2021-02-09 – 2021-02-12 (×8): 5000 [IU] via SUBCUTANEOUS
  Filled 2021-02-09 (×8): qty 1

## 2021-02-09 MED ORDER — GABAPENTIN 300 MG PO CAPS: 300.0000 mg | ORAL_CAPSULE | ORAL | Status: DC

## 2021-02-09 MED ORDER — ACETAMINOPHEN 500 MG PO TABS
1000.0000 mg | ORAL_TABLET | Freq: Four times a day (QID) | ORAL | Status: DC
  Administered 2021-02-09 – 2021-02-12 (×12): 1000 mg via ORAL
  Filled 2021-02-09 (×12): qty 2

## 2021-02-09 MED ORDER — ROCURONIUM BROMIDE 10 MG/ML (PF) SYRINGE
PREFILLED_SYRINGE | INTRAVENOUS | Status: DC | PRN
  Administered 2021-02-09: 30 mg via INTRAVENOUS
  Administered 2021-02-09: 20 mg via INTRAVENOUS
  Administered 2021-02-09: 50 mg via INTRAVENOUS
  Administered 2021-02-09: 20 mg via INTRAVENOUS

## 2021-02-09 MED ORDER — METRONIDAZOLE 500 MG PO TABS
1000.0000 mg | ORAL_TABLET | ORAL | Status: DC
  Filled 2021-02-09: qty 2

## 2021-02-09 MED ORDER — FUROSEMIDE 40 MG PO TABS
40.0000 mg | ORAL_TABLET | Freq: Two times a day (BID) | ORAL | Status: DC
  Administered 2021-02-09 – 2021-02-12 (×6): 40 mg via ORAL
  Filled 2021-02-09 (×6): qty 1

## 2021-02-09 MED ORDER — ACETAMINOPHEN 500 MG PO TABS: 1000.0000 mg | ORAL_TABLET | Freq: Once | ORAL | Status: DC

## 2021-02-09 MED ORDER — BUPIVACAINE-EPINEPHRINE 0.25% -1:200000 IJ SOLN
INTRAMUSCULAR | Status: DC | PRN
  Administered 2021-02-09: 30 mL

## 2021-02-09 MED ORDER — POLYETHYL GLYCOL-PROPYL GLYCOL 0.4-0.3 % OP GEL: Freq: Two times a day (BID) | OPHTHALMIC | Status: DC

## 2021-02-09 MED ORDER — ENSURE PRE-SURGERY PO LIQD
592.0000 mL | Freq: Once | ORAL | Status: DC
  Filled 2021-02-09: qty 592

## 2021-02-09 MED ORDER — POLYETHYLENE GLYCOL 3350 17 GM/SCOOP PO POWD: 1.0000 | Freq: Once | ORAL | Status: DC

## 2021-02-09 MED ORDER — POLYVINYL ALCOHOL 1.4 % OP SOLN
1.0000 [drp] | Freq: Two times a day (BID) | OPHTHALMIC | Status: DC
  Administered 2021-02-09 – 2021-02-12 (×6): 1 [drp] via OPHTHALMIC
  Filled 2021-02-09: qty 15

## 2021-02-09 MED ORDER — NEOMYCIN SULFATE 500 MG PO TABS
1000.0000 mg | ORAL_TABLET | ORAL | Status: DC
  Filled 2021-02-09: qty 2

## 2021-02-09 MED ORDER — FENTANYL CITRATE (PF) 250 MCG/5ML IJ SOLN
INTRAMUSCULAR | Status: DC | PRN
  Administered 2021-02-09 (×3): 50 ug via INTRAVENOUS

## 2021-02-09 MED ORDER — TRAZODONE HCL 50 MG PO TABS
150.0000 mg | ORAL_TABLET | Freq: Every day | ORAL | Status: DC
  Administered 2021-02-09 – 2021-02-11 (×3): 150 mg via ORAL
  Filled 2021-02-09 (×3): qty 1

## 2021-02-09 MED ORDER — ENSURE PRE-SURGERY PO LIQD
296.0000 mL | Freq: Once | ORAL | Status: DC
  Filled 2021-02-09: qty 296

## 2021-02-09 MED ORDER — LACTATED RINGERS IR SOLN
Status: DC | PRN
  Administered 2021-02-09: 1000 mL

## 2021-02-09 MED ORDER — ONDANSETRON HCL 4 MG/2ML IJ SOLN
INTRAMUSCULAR | Status: AC
  Filled 2021-02-09: qty 2

## 2021-02-09 SURGICAL SUPPLY — 66 items
ADH SKN CLS APL DERMABOND .7 (GAUZE/BANDAGES/DRESSINGS) ×6
BAG URO CATCHER STRL LF (MISCELLANEOUS) ×4 IMPLANT
BLADE SURG SZ11 CARB STEEL (BLADE) ×4 IMPLANT
CATH INTERMIT  6FR 70CM (CATHETERS) ×3 IMPLANT
CATH URET 5FR 28IN OPEN ENDED (CATHETERS) ×4 IMPLANT
CLOTH BEACON ORANGE TIMEOUT ST (SAFETY) ×4 IMPLANT
COVER BACK TABLE 60X90IN (DRAPES) ×4 IMPLANT
COVER MAYO STAND STRL (DRAPES) ×4 IMPLANT
COVER SURGICAL LIGHT HANDLE (MISCELLANEOUS) ×4 IMPLANT
COVER TIP SHEARS 8 DVNC (MISCELLANEOUS) ×3 IMPLANT
COVER TIP SHEARS 8MM DA VINCI (MISCELLANEOUS) ×4
COVER WAND RF STERILE (DRAPES) ×4 IMPLANT
DECANTER SPIKE VIAL GLASS SM (MISCELLANEOUS) ×4 IMPLANT
DERMABOND ADVANCED (GAUZE/BANDAGES/DRESSINGS) ×2
DERMABOND ADVANCED .7 DNX12 (GAUZE/BANDAGES/DRESSINGS) ×6 IMPLANT
DRAIN CHANNEL 19F RND (DRAIN) ×4 IMPLANT
DRAPE ARM DVNC X/XI (DISPOSABLE) ×12 IMPLANT
DRAPE COLUMN DVNC XI (DISPOSABLE) ×3 IMPLANT
DRAPE DA VINCI XI ARM (DISPOSABLE) ×16
DRAPE DA VINCI XI COLUMN (DISPOSABLE) ×4
DRAPE SHEET LG 3/4 BI-LAMINATE (DRAPES) ×4 IMPLANT
DRAPE SURG IRRIG POUCH 19X23 (DRAPES) ×4 IMPLANT
DRAPE UTILITY XL STRL (DRAPES) ×8 IMPLANT
ELECT PENCIL ROCKER SW 15FT (MISCELLANEOUS) ×4 IMPLANT
ELECT REM PT RETURN 15FT ADLT (MISCELLANEOUS) ×4 IMPLANT
EVACUATOR DRAINAGE 10X20 100CC (DRAIN) ×3 IMPLANT
EVACUATOR SILICONE 100CC (DRAIN) ×4
GLOVE SURG ENC MOIS LTX SZ8 (GLOVE) ×4 IMPLANT
GOWN STRL REUS W/TWL LRG LVL3 (GOWN DISPOSABLE) ×4 IMPLANT
GUIDEWIRE STR DUAL SENSOR (WIRE) ×4 IMPLANT
HOLDER FOLEY CATH W/STRAP (MISCELLANEOUS) ×4 IMPLANT
KIT BASIN OR (CUSTOM PROCEDURE TRAY) ×4 IMPLANT
KIT PROCEDURE DA VINCI SI (MISCELLANEOUS)
KIT PROCEDURE DVNC SI (MISCELLANEOUS) IMPLANT
KIT TURNOVER KIT A (KITS) ×8 IMPLANT
LEGGING LITHOTOMY PAIR STRL (DRAPES) ×4 IMPLANT
NDL INSUFFLATION 14GA 120MM (NEEDLE) ×1 IMPLANT
NEEDLE HYPO 22GX1.5 SAFETY (NEEDLE) ×4 IMPLANT
NEEDLE INSUFFLATION 14GA 120MM (NEEDLE) ×4 IMPLANT
NS IRRIG 1000ML POUR BTL (IV SOLUTION) IMPLANT
PACK CARDIOVASCULAR III (CUSTOM PROCEDURE TRAY) ×4 IMPLANT
PACK CYSTO (CUSTOM PROCEDURE TRAY) ×4 IMPLANT
PAD POSITIONING PINK XL (MISCELLANEOUS) ×4 IMPLANT
PROTECTOR NERVE ULNAR (MISCELLANEOUS) ×8 IMPLANT
SCISSORS LAP 5X45 EPIX DISP (ENDOMECHANICALS) ×4 IMPLANT
SEAL CANN UNIV 5-8 DVNC XI (MISCELLANEOUS) ×12 IMPLANT
SEAL XI 5MM-8MM UNIVERSAL (MISCELLANEOUS) ×16
SEALER VESSEL DA VINCI XI (MISCELLANEOUS) ×4
SEALER VESSEL EXT DVNC XI (MISCELLANEOUS) ×3 IMPLANT
SET IRRIG TUBING LAPAROSCOPIC (IRRIGATION / IRRIGATOR) ×4 IMPLANT
SLEEVE ADV FIXATION 5X100MM (TROCAR) IMPLANT
SOL ANTI FOG 6CC (MISCELLANEOUS) ×3 IMPLANT
SOLUTION ANTI FOG 6CC (MISCELLANEOUS) ×1
SOLUTION ELECTROLUBE (MISCELLANEOUS) ×4 IMPLANT
SPONGE LAP 18X18 RF (DISPOSABLE) ×4 IMPLANT
SUT ETHIBOND 0 36 GRN (SUTURE) IMPLANT
SUT VLOC 180 2-0 9IN GS21 (SUTURE) IMPLANT
SYR 20ML LL LF (SYRINGE) ×4 IMPLANT
SYR CONTROL 10ML LL (SYRINGE) ×4 IMPLANT
TOWEL OR 17X26 10 PK STRL BLUE (TOWEL DISPOSABLE) ×4 IMPLANT
TOWEL OR NON WOVEN STRL DISP B (DISPOSABLE) ×4 IMPLANT
TRAY FOLEY MTR SLVR 14FR STAT (SET/KITS/TRAYS/PACK) ×4 IMPLANT
TRAY FOLEY MTR SLVR 16FR STAT (SET/KITS/TRAYS/PACK) ×4 IMPLANT
TROCAR ADV FIXATION 5X100MM (TROCAR) ×4 IMPLANT
TUBING CONNECTING 10 (TUBING) ×8 IMPLANT
TUBING INSUFFLATION 10FT LAP (TUBING) ×4 IMPLANT

## 2021-02-09 NOTE — Anesthesia Procedure Notes (Addendum)
Procedure Name: Intubation Date/Time: 02/09/2021 8:39 AM Performed by: Sharlette Dense, CRNA Patient Re-evaluated:Patient Re-evaluated prior to induction Oxygen Delivery Method: Circle system utilized Preoxygenation: Pre-oxygenation with 100% oxygen Induction Type: IV induction Ventilation: Mask ventilation without difficulty Laryngoscope Size: Glidescope and 3 Grade View: Grade I Tube type: Parker flex tip Tube size: 7.0 mm Number of attempts: 1 Airway Equipment and Method: Rigid stylet and Video-laryngoscopy Placement Confirmation: ETT inserted through vocal cords under direct vision, positive ETCO2 and breath sounds checked- equal and bilateral Secured at: 21 cm Tube secured with: Tape Dental Injury: Teeth and Oropharynx as per pre-operative assessment  Difficulty Due To: Difficulty was anticipated, Difficult Airway- due to reduced neck mobility and Difficult Airway- due to anterior larynx Comments: 7.0 Parker flex tip endotracheal tube was a tight fit.  Larger tube may be unwarranted

## 2021-02-09 NOTE — Op Note (Signed)
PATIENT: [name]  80 y.o. male  Patient Care Team: Jonathon Jordan, MD as PCP - General (Family Medicine) Alessandra Grout, MD (Inactive) as Referring Physician (Family Medicine) Bensimhon, Shaune Pascal, MD as Attending Physician (Cardiology) Leslie Andrea, MD as Referring Physician (Internal Medicine) Rutherford Guys, MD as Consulting Physician (Ophthalmology)  PREOP DIAGNOSIS: full thickness rectal prolapse,  POSTOP DIAGNOSIS: full thickness rectal prolapse,  PROCEDURE:  Robotic assisted suture rectopexy Bilateral transversus abdominus plane blocks  SURGEON: Sharon Mt. Emerie Vanderkolk, MD  ASSISTANT: Leighton Ruff, MD  ANESTHESIA: General endotracheal  EBL: 20 mL for my portion of the procedure Total I/O In: -  Out: 240 [Urine:175; Blood:65]  DRAINS: 63 Fr round blake drain left draining the presacral space  SPECIMEN: None  COUNTS: Sponge, needle and instrument counts were reported correct x2  FINDINGS:  Full thickness prolapse apparent in OR. Pelvic adhesions between sigmoid and rectum consistent with prior radiation. Fibrosis of tissues between fascia propria of rectum and presacral fascia as well consistent with prior XRT. A suture rectopexy was carried out after mobilization of the rectum with a good result.  NARRATIVE: Informed consent was verified. He was taken to the operating room, placed supine on the operating table and SCD's were applied. General endotracheal anesthesia was induced without difficulty. The patient was then positioned in the lithotomy position with Allen stirrups. Arms were tucked and he was secured to the table. Pressure points were then evaluated and padded. Hair on the abdomen was clipped.  The perineum was prepped. Dr. Gloriann Loan scrubbed for his portion placing ureteral stents and ICG.  The abdomen was then prepped and draped in the standard sterile fashion. Surgical timeout was called indicating the correct patient, procedure, positioning and need for  preoperative antibiotics.   An OG tube was placed by anesthesia and confirmed to be to suction.  At Palmer's point, a stab incision was created and the Veress needle was introduced into the peritoneal cavity on the first attempt.  Intraperitoneal location was confirmed by the aspiration and saline drop test.  Pneumoperitoneum was established to a maximum pressure of 15 mmHg using CO2.  Following this, the abdomen was marked for planned trocar sites.  Just to the right and cephalad to the umbilicus, an 8 mm incision was created and an 8 mm blunt tipped robotic trocar was cautiously placed into the peritoneal cavity.  The laparoscope was inserted and demonstrated no evidence of trocar site nor Veress needle site complications.  The Veress needle was removed.  Bilateral transversus abdominis plane blocks were then created using a dilute mixture of Exparel with Marcaine.  3 additional 8 mm robotic trochars were placed under direct visualization roughly in-line and on both sides of the previously placed paraumbilical trocar. An additional 5 mm assist port was placed in the right lateral abdomen under direct visualization.  The abdomen was surveyed and there was some adhesions between sigmoid and rectum which were consistent with prior XRT. He was positioned in Trendelenburg with some left side up.  Small bowel was carefully retracted out of the pelvis.  The robot was then docked and I went to the console.   The sigmoid colon was readily identified.  This was retracted medially.  Attachments of the sigmoid colon were taken down from the intersigmoid fossa.  The rectosigmoid colon was grasped and elevated anteriorly.  Beginning with a medial to lateral approach, the peritoneum overlying the presacral space was carefully incised.  His IMA does appear to arise somewhat to the right  of midline and clearly goes all the way out to his rectosigmoid colon.  The TME plane was readily gained working in a plane between the  fascia propria of the rectum and the presacral fascia.  Hypogastric nerves were seen going along the the presacral fascia and were protected free of injury.  Working more proximally, the mesorectum and sigmoid mesentery were carefully mobilized off of the peritoneum.  The left ureter was identified and protected free of injury.  The left gonadal vessels were identified and protected.  These were both swept "down."  The superior hemorrhoidal and IMA pedicles were identified. Attention was then turned to the lateral portion of dissection.  The sigmoid colon was retracted to the right.  The rectosigmoid colon was mobilized. The rectosigmoid colon was elevated anteriorly. The left ureter was re-identified.  The plane was developed between the fascia propria of the rectum and the presacral fascia beginning with the posterior approach first.  This was carried all the way down to the pelvic floor.  The right lateral attachments/stalk was taken with the vessel sealer as well.  The peritoneum was opened.  The mesorectum was carefully dissected away.  Of note, he does have a fair amount of adhesive type disease and fibrosis consistent with prior XRT between the fascia propria of the rectum and the presacral fascia.  This also existed laterally.  This did make the dissection somewhat tricky but we were able to maintain a clean plane all the way down to the pelvic floor.  The levator hiatus was identified.  The rectum was mobilized posteriorly and laterally along the right side as well as anteriorly.  Maintaining our TME plane throughout this dissection.  The left lateral rectal stalk was intentionally left in situ.  The rectum was mobilized on the left side but again preserving the stalk itself.  I then went below and did a rectal exam.  There was adequate mobilization and complete resolution of his prolapse component.  The rectum was able to be placed on some gentle tension and remained taut with a nice straight course down  to the levator.  Given the radiation history and our discussions preoperatively, we are now proceeding with the suture rectopexy portion of the procedure.  The presacral fascia was identified.  At the level of the sacral promontory, two 0 Ethibond sutures were placed into the presacral fascia and when placed on tension there appears to be excellent tissue quality at this location. The rectum was fixed to this location taking peritoneum and small portion of associated mesorectum.  There is a nice straight course of the rectum entering the pelvis with a small amount of appropriate tension.  The small bowel despite his position continued to roll into the pelvis.  With gentle manipulation and retraction there was an evident small 5 mm deserosalization that was repaired with a 2-0 Vicryl suture.  The pelvis was irrigated and hemostasis appreciated. The rectum and sigmoid colon were also reinspected and noted to be intact without any deserosalization. The peritoneum on the right side of the rectum was then reapproximated using a running 2-0 V-Loc suture.  A 19 French round Blake drain was introduced to the 5 mm port and placed into the presacral space.  The drain was secured to the skin using a 2-0 nylon suture.  The robot was undocked.  I scrubbed back in.  The abdomen and pelvis are surveyed and noted to be completely hemostatic. Sponge, needle, and instrument counts were reported correct. Under direct  visualization, all trochars are removed.    The left lateralmost port site did have visible fascia at the skin due to how thin his abdominal wall was at this exact location.  I reapproximate the fascia at this trocar site using a 2-0 Vicryl suture.  4-0 Monocryl subcuticular suture was used to close the skin of all incision sites.  Dermabond was placed over all incisions. A drain sponge was placed. The drain was attached to bulb suction.   Ureteral stents were removed.  He is taken out of lithotomy, awakened  from anesthesia, extubated, and transferred to a stretcher for transport to PACU in satisfactory condition having tolerated the procedure well.

## 2021-02-09 NOTE — H&P (Signed)
CC: Postop f/u  HPI: [name] is a very pleasant 79yoM history of anal canal 'polyp' that he was taken to the operating room 10/08/17 for excision of this.  Pathology returned benign-no adenomatous changes or malignancy.  He had previously noticed issues with "clustering" stools and having between 6 and 10 bowel movements back-to-back in the morning.  He states that for the last year or 2 he has noticed a progressive prolapsing type of tissue from his anal canal.  It previously taken photos but since deleted them off his phone.  He reports that there is a red looking ball the prolapses.  He states that it can be as large as a golf ball.  He'll oftentimes sitdown and will go back in place.  He also has some degree of issues with incontinence to liquid and solid stool.  He denies any significant pain at this point in time or  rectal bleeding; does have mucous  He states his last colonoscopy was ~8 years ago with Dr. Oletta Skinner and he believes there is no pertinent findings -although this report is not an Epic at this time.  He does report a personal history of colon polyps in the past on prior colonoscopies.    He underwent colonoscopy with Dr. Cristina Skinner which demonstrated some expected findings with regards to distal rectal mucosal changes consistent with chronic prolapse but no obvious masses or lesions.  He also met with the urologists.  He stated he told him his prostate was enlarged but had no plans for any sort of intervention or procedures for this.  He has had ongoing issues with rectal prolapse.  This occurs multiple times per day.  He is able to reduce it.  It has significantly impact his quality of life.  He is highly interested in proceeding with surgery to address this.  He took photos of this prolapse were uploaded to the media section of our electronic health record.  He denies any changes in his health or health history since we met in the office back in December.  He has recently had cataract  surgery and recovered. He tolerated his bowel prep with adequate result. States he is ready for surgery.  PMH: HTN, HLD (well controlled on statin), AS s/p AVR, pacemaker - Dr. Virl Skinner is his cardiologist.  Testicular cancer status post surgery and radiation.  PSH: Orchiectomy for testicular cancer  FHx: Denies FHx of GI malignancy  Social: Denies use of tobacco/EtOH/drugs.  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  ROS: A comprehensive 10 system review of systems was completed with the patient and pertinent findings as noted above.    Allergies Penicillins   BANANAS   Allergies Reconciled    Medication History  Montelukast Sodium  (10MG Tablet, Oral) Active. Klor-Con M20  [facility] Tablet ER, Oral) Active. Acetaminophen  (500MG Tablet, Oral) Active. Metamucil  (28.3% Powder, Oral) Active. Probiotic  (Oral) Active. Atorvastatin Calcium  (40MG Tablet, Oral) Active. BusPIRone HCl  (15MG Tablet, Oral) Active. Fluticasone Propionate  (50MCG/ACT Suspension, Nasal) Active. Furosemide  (40MG Tablet, Oral) Active. Gabapentin  (300MG Capsule, Oral) Active. Sertraline HCl  (100MG Tablet, Oral) Active. Aspirin  (81MG Tablet, Oral) Active. Multi-Vitamin  (Oral) Active. Medications Reconciled     Review of Systems  General Not Present- Appetite Loss, Chills, Fatigue, Fever, Night Sweats, Weight Gain and Weight Loss. Skin Not Present- Change in Wart/Mole, Dryness, Hives, Jaundice, New Lesions, Non-Healing Wounds, Rash and Ulcer. HEENT Present- Hearing Loss, Seasonal Allergies and Wears  glasses/contact lenses. Not Present- Earache, Hoarseness, Nose Bleed, Oral Ulcers, Ringing in the Ears, Sinus Pain, Sore Throat, Visual Disturbances and Yellow Eyes. Respiratory Not Present- Bloody sputum, Chronic Cough, Difficulty Breathing, Snoring and Wheezing. Breast Not Present- Breast Mass, Breast Pain, Nipple Discharge and Skin Changes. Cardiovascular Present- Swelling of  Extremities. Not Present- Chest Pain, Difficulty Breathing Lying Down, Leg Cramps, Palpitations, Rapid Heart Rate and Shortness of Breath. Gastrointestinal Present- Hemorrhoids. Not Present- Abdominal Pain, Bloating, Bloody Stool, Change in Bowel Habits, Chronic diarrhea, Constipation, Difficulty Swallowing, Excessive gas, Gets full quickly at meals, Indigestion, Nausea, Rectal Pain and Vomiting. Male Genitourinary Not Present- Blood in Urine, Change in Urinary Stream, Frequency, Impotence, Nocturia, Painful Urination, Urgency and Urine Leakage. Musculoskeletal Not Present- Back Pain and Backache. Psychiatric Not Present- Anxiety and Depression. Hematology Not Present- Abnormal Bleeding and Blood Clots.  Vitals:   02/09/21 0659  BP: (!) 135/59  Pulse: 67  Temp: 97.8 F (36.6 C)  TempSrc: Oral  SpO2: 100%       Physical Exam   Constitutional: No acute distress; conversant; no deformities; wearing mask Eyes: Moist conjunctiva; no lid lag; anicteric sclerae; pupils equal and round Lungs: Normal respiratory effort CV: rrr; no pitting edema GI: Abdomen soft, nontender, nondistended; no palpable hepatosplenomegaly (Historical exam - Anorectal: Somewhat gaping anus; decreased tone; no clearly palpable masses.) MSK: Normal gait; no clubbing/cyanosis Psychiatric: Appropriate affect; alert and oriented 3    Assessment & Plan   Mr. 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 Cristina Skinner - no concerning findings  -Cardiac clearance obtained -The anatomy and physiology of the GI tract was discussed at length with the patient and his wife today. The pathophysiology of full-thickness rectal prolapse was discussed  at length with associated pictures -We have reviewed options moving forward including surgery which is most definitive - robotic assisted suture rectopexy. Given minimal constipation related symptoms as well as his decreased tone discussed suture rectopexy as opposed to resection rectopexy; ICG/firefly by urology given remote radiation history; possible flexible sigmoidoscopy -The planned procedures, material risks (including, but not limited to, pain, bleeding, infection, scarring, need for blood transfusion, damage to surrounding structures- blood vessels/nerves/viscus/organs, damage to ureter, urine leak, leak from anastomosis, need for additional procedures, worsening of pre-existing medical conditions, need for stoma which may be permanent, conversion to open surgery, hernia, recurrence, DVT/PE, pneumonia, heart attack, stroke, death) benefits and alternatives to surgery were discussed at length. I noted a good probability that the procedure would help improve their symptoms. The patient's and his wife's questions were answered to their satisfaction, they voiced understanding and elected to proceed with surgery. Additionally, we discussed typical postoperative expectations and the recovery process.

## 2021-02-09 NOTE — Transfer of Care (Signed)
Immediate Anesthesia Transfer of Care Note  Patient: [name]  Procedure(s) Performed: XI ROBOT ASSISTED SUTURE  RECTOPEXY (Abdomen) CYSTOSCOPY with FIREFLY INJECTION AND BILATERAL OPEN ENDED CATHETER PLACEMENT (Bilateral: Perineum)  Patient Location: PACU  Anesthesia Type:General  Level of Consciousness: awake and alert   Airway & Oxygen Therapy: Patient Spontanous Breathing and Patient connected to face mask oxygen  Post-op Assessment: Report given to RN and Post -op Vital signs reviewed and stable  Post vital signs: Reviewed and stable  Last Vitals:  Vitals Value Taken Time  BP 152/60 02/09/21 1158  Temp    Pulse 64 02/09/21 1159  Resp 11 02/09/21 1159  SpO2 100 % 02/09/21 1159  Vitals shown include unvalidated device data.  Last Pain:  Vitals:   02/09/21 0659  TempSrc: Oral         Complications: No notable events documented.

## 2021-02-09 NOTE — Op Note (Signed)
Operative Note  Preoperative diagnosis:  1.  Rectal prolapse  Postoperative diagnosis: 1.  Rectal prolapse  Procedure(s): 1.  Cystoscopy with bilateral ureteral catheterization with bilateral firefly instillation and bilateral temporary ureteral stent placement  Surgeon: Link Snuffer, MD  Assistants: None  Anesthesia: General  Complications: None immediate  EBL: Minimal  Specimens: 1.  None  Drains/Catheters: 1.  Foley catheter, bilateral open-ended ureteral stents  Intraoperative findings: 1.  Normal anterior urethra 2.  Borderline obstructing prostate 3.  Patient had some mild to moderate trabeculation.  No obvious stones or masses in the bladder.  Indication: 80 year old male with rectal prolapse presents for rectopexy.  He has a history of pelvic radiation from testicular cancer in the remote past.  Bilateral ureteral firefly instillation and ureteral stent placement was requested.  Description of procedure:  The patient was identified and consent was obtained.  The patient was taken to the operating room and placed in the supine position.  The patient was placed under general anesthesia.  Perioperative antibiotics were administered.  The patient was placed in dorsal lithotomy.  Patient was prepped and draped in a standard sterile fashion and a timeout was performed.  A 21 French rigid cystoscope was advanced into the urethra and into the bladder.  Complete cystoscopy was performed with findings noted above.  The left ureter was cannulated with an open-ended ureteral catheter and 7.5 cc of firefly was instilled into the left ureter.  A wire was then advanced through the open-ended ureteral catheter and up the left ureter.  The open-ended ureteral catheter was carefully advanced up the ureter over the wire without resistance.  The wire was withdrawn.  The scope was withdrawn keeping the open-ended ureteral catheter in place.  I readvanced the scope into the bladder and cannulated  the right ureteral orifice with an open-ended ureteral catheter.  Again 7.5 cc of firefly was instilled into the right ureter.  A wire was then advanced through the open-ended ureteral catheter up the right ureter.  The open-ended ureteral catheter was then advanced over the wire and up the ureter without resistance.  The wire was withdrawn followed by the scope keeping the ureteral catheters in place.  Foley catheter was placed and the open-ended ureteral catheters were threaded into drainage bag.  This include the operation.  Case was turned over to general surgery.  Plan: As per general surgery

## 2021-02-09 NOTE — H&P (Signed)
H&P Physician requesting consult: Nadeen Landau  Chief Complaint: Rectal prolapse  History of Present Illness: 80 year old male with rectal prolapse presents for rectopexy.  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 Ouida Sills   Depression    DM2 (diabetes mellitus, type 2) ([place])    fasting blood sugar - 90s   Exposure to secondhand smoke    extensive exposure   Heart murmur    Hyperlipidemia    treated   LBBB (left bundle branch block)    Testicular cancer [facility])    s/p XRT and surgical resection.   Past Surgical History:  Procedure Laterality Date   AORTIC VALVE REPLACEMENT N/A 02/17/2013   Procedure: AORTIC VALVE REPLACEMENT (AVR);  Surgeon: Gaye Pollack, MD;  Location: [facility];  Service: Open Heart Surgery;  Laterality: N/A;   CYST REMOVAL NECK     EYE SURGERY     INNER EAR SURGERY     INTRAOPERATIVE TRANSESOPHAGEAL ECHOCARDIOGRAM N/A 02/17/2013   Procedure: INTRAOPERATIVE TRANSESOPHAGEAL ECHOCARDIOGRAM;  Surgeon: Gaye Pollack, MD;  Location: [facility] OR;  Service: Open Heart Surgery;  Laterality: N/A;   LEFT AND RIGHT HEART CATHETERIZATION WITH CORONARY ANGIOGRAM N/A 12/25/2012   Procedure: LEFT AND RIGHT HEART CATHETERIZATION WITH CORONARY ANGIOGRAM;  Surgeon: Jolaine Artist, MD;  Location: [facility] CATH LAB;  Service: Cardiovascular;  Laterality: N/A;   PERMANENT PACEMAKER INSERTION N/A 02/19/2013   Procedure: PERMANENT PACEMAKER INSERTION;  Surgeon: Deboraha Sprang, MD;  Location: [facility] CATH LAB;  Service: Cardiovascular;  Laterality: N/A;   RECTAL BIOPSY N/A 10/08/2017   Procedure:  EXCISION OF ANAL POLYP ;  Surgeon: Ileana Roup, MD;  Location: [facility] ORS;  Service: General;  Laterality: N/A;   stapendectomy  07/30/02   right   XRT and surgical resection     s/p. 10 years ago    Home Medications:  Medications Prior to Admission  Medication Sig Dispense Refill Last Dose   acetaminophen (TYLENOL) 500 MG tablet Take 500 mg by mouth 3 (three) times daily.   02/08/2021   aspirin 81 MG tablet Take 1 tablet (81 mg total) by mouth daily. 30 tablet 11 02/08/2021   busPIRone (BUSPAR) 15 MG tablet Take 15-30 mg by mouth See admin instructions. Take 15 mg by mouth every morning and 30 mg every day at noon   02/09/2021 at 0500   Cholecalciferol (VITAMIN D) 2000 units tablet Take 2,000 Units by mouth every other day.    02/08/2021   cycloSPORINE (RESTASIS) 0.05 % ophthalmic emulsion Place 1 drop into both eyes 2 (two) times daily.   02/08/2021   fenofibrate 160 MG tablet Take 160 mg by mouth daily.   02/09/2021 at 0500   fluticasone (FLONASE) 50 MCG/ACT nasal spray Place 2 sprays into the nose daily.   02/08/2021   FORA V12 BLOOD GLUCOSE TEST test strip    02/08/2021   furosemide (LASIX) 40 MG tablet TAKE ONE TABLET TWICE DAILY 60 tablet 11 02/08/2021   gabapentin (NEURONTIN) 300 MG capsule Take 300-600 mg  by mouth See admin instructions. Take 300 mg by mouth each morning and 600 mg each afternoon.   02/09/2021 at 0500   ketorolac (ACULAR) 0.5 % ophthalmic solution Place 1 drop into the right eye 4 (four) times daily. 5 mL 12 02/08/2021   LITETOUCH LANCETS MISC    02/08/2021   montelukast (SINGULAIR) 10 MG tablet Take 10 mg by mouth daily.   02/08/2021   Multiple Vitamin (MULTIVITAMIN WITH MINERALS) TABS tablet Take 1 tablet by mouth daily.   02/08/2021   Polyethyl Glycol-Propyl Glycol (SYSTANE OP) Place 1 drop into both eyes 2 (two) times daily.   02/08/2021   polyethylene glycol (MIRALAX / GLYCOLAX) 17 g packet Take 17 g by mouth daily.   02/08/2021   potassium chloride SA (K-DUR,KLOR-CON) 20  MEQ tablet TAKE ONE TABLET EVERY DAY (Patient taking differently: Take 20 mEq by mouth daily.) 90 tablet 3 02/08/2021   Probiotic Product (PHILLIPS COLON HEALTH PO) Take 1 capsule by mouth daily.   02/08/2021   sertraline (ZOLOFT) 100 MG tablet Take 100 mg by mouth daily.   02/09/2021 at 0500   traZODone (DESYREL) 50 MG tablet Take 150 mg by mouth at bedtime.   02/08/2021   Allergies:  Allergies  Allergen Reactions   Banana Anaphylaxis   Penicillins Rash    Has patient had a PCN reaction causing immediate rash, facial/tongue/throat swelling, SOB or lightheadedness with hypotension: Unknown Has patient had a PCN reaction causing severe rash involving mucus membranes or skin necrosis: Unknown Has patient had a PCN reaction that required hospitalization: No Has patient had a PCN reaction occurring within the last 10 years: No If all of the above answers are "NO", then may proceed with Cephalosporin use.     Family History  Problem Relation Age of Onset   Brain cancer Mother    Cancer Father        lung   Social History:  reports that he has never smoked. He has never used smokeless tobacco. He reports that he does not drink alcohol and does not use drugs.  ROS: A complete review of systems was performed.  All systems are negative except for pertinent findings as noted. ROS   Physical Exam:  Vital signs in last 24 hours: Temp:  [97.8 F (36.6 C)] 97.8 F (36.6 C) (06/15 0659) Pulse Rate:  [67] 67 (06/15 0659) BP: (135)/(59) 135/59 (06/15 0659) SpO2:  [100 %] 100 % (06/15 0659) General:  Alert and oriented, No acute distress HEENT: Normocephalic, atraumatic Neck: No JVD or lymphadenopathy Cardiovascular: Regular rate and rhythm Lungs: Regular rate and effort Abdomen: Soft, nontender, nondistended, no abdominal masses Back: No CVA tenderness Extremities: No edema Neurologic: Grossly intact  Laboratory Data:  Results for orders placed or performed during the hospital encounter  of 02/09/21 (from the past 24 hour(s))  Glucose, capillary     Status: None   Collection Time: 02/09/21  6:39 AM  Result Value Ref Range   Glucose-Capillary 89 70 - 99 mg/dL  CBC WITH DIFFERENTIAL     Status: Abnormal   Collection Time: 02/09/21  6:55 AM  Result Value Ref Range   WBC 6.8 4.0 - 10.5 K/uL   RBC 4.15 (L) 4.22 - 5.81 MIL/uL   Hemoglobin 13.8 13.0 - 17.0 g/dL   HCT 41.2 39.0 - 52.0 %   MCV 99.3 80.0 - 100.0 fL   MCH 33.3 26.0 - 34.0 pg   MCHC 33.5 30.0 - 36.0 g/dL   RDW 11.9 11.5 -  15.5 %   Platelets 194 150 - 400 K/uL   nRBC 0.0 0.0 - 0.2 %   Neutrophils Relative % 50 %   Neutro Abs 3.4 1.7 - 7.7 K/uL   Lymphocytes Relative 41 %   Lymphs Abs 2.8 0.7 - 4.0 K/uL   Monocytes Relative 7 %   Monocytes Absolute 0.5 0.1 - 1.0 K/uL   Eosinophils Relative 1 %   Eosinophils Absolute 0.1 0.0 - 0.5 K/uL   Basophils Relative 1 %   Basophils Absolute 0.0 0.0 - 0.1 K/uL   Immature Granulocytes 0 %   Abs Immature Granulocytes 0.02 0.00 - 0.07 K/uL  Type and screen [place]     Status: None   Collection Time: 02/09/21  6:55 AM  Result Value Ref Range   ABO/RH(D) A NEG    Antibody Screen NEG    Sample Expiration      02/12/2021,2359 Performed at [facility], [place] [address], [place], [place] [address]    Recent Results (from the past 240 hour(s))  SARS CORONAVIRUS 2 (TAT 6-24 HRS) Nasopharyngeal Nasopharyngeal Swab     Status: None   Collection Time: 02/07/21 12:15 PM   Specimen: Nasopharyngeal Swab  Result Value Ref Range Status   SARS Coronavirus 2 NEGATIVE NEGATIVE Final    Comment: (NOTE) SARS-CoV-2 target nucleic acids are NOT DETECTED.  The SARS-CoV-2 RNA is generally detectable in upper and lower respiratory specimens during the acute phase of infection. Negative results do not preclude SARS-CoV-2 infection, do not rule out co-infections with other pathogens, and should not be used as the sole basis for treatment or other  patient management decisions. Negative results must be combined with clinical observations, patient history, and epidemiological information. The expected result is Negative.  Fact Sheet for Patients: SugarRoll.be  Fact Sheet for Healthcare Providers: https://www.woods-mathews.com/  This test is not yet approved or cleared by the [place] FDA and  has been authorized for detection and/or diagnosis of SARS-CoV-2 by FDA under an Emergency Use Authorization (EUA). This EUA will remain  in effect (meaning this test can be used) for the duration of the COVID-19 declaration under Se ction 564(b)(1) of the Act, 21 U.S.C. section 360bbb-3(b)(1), unless the authorization is terminated or revoked sooner.  Performed at [facility], [place] [address], [place], [place] [address]    Creatinine: No results for input(s): CREATININE in the last 168 hours.  Impression/Assessment:  Rectal prolapse  Plan:  Proceed with cystoscopy with bilateral firefly instillation and bilateral ureteral stent placement.  Risk and benefits discussed.  Consent obtained.  Marton Redwood, III 02/09/2021, 8:34 AM

## 2021-02-09 NOTE — Anesthesia Procedure Notes (Signed)
Procedure Name: Intubation Date/Time: 02/09/2021 8:39 AM Performed by: Brennan Bailey, MD Pre-anesthesia Checklist: Patient identified, Emergency Drugs available, Suction available and Patient being monitored Patient Re-evaluated:Patient Re-evaluated prior to induction Oxygen Delivery Method: Circle System Utilized Preoxygenation: Pre-oxygenation with 100% oxygen Induction Type: IV induction Ventilation: Mask ventilation without difficulty Laryngoscope Size: Glidescope and 4 Tube type: Parker flex tip Tube size: 7.0 mm Number of attempts: 1 Airway Equipment and Method: Video-laryngoscopy and Rigid stylet Placement Confirmation: ETT inserted through vocal cords under direct vision, positive ETCO2 and breath sounds checked- equal and bilateral Secured at: 22 cm Tube secured with: Tape Dental Injury: Teeth and Oropharynx as per pre-operative assessment  Comments: Glidescope used due to previous documented difficulty with DL and successful Glidescope intubation. Excellent view with Glidescope #4. Parker 7.0 ETT passed through cords easily but some resistance felt while advancing through trachea. Would use caution with ETT larger than 7.0 in future due to possible tracheal stenosis. Daiva Huge, MD

## 2021-02-09 NOTE — Anesthesia Postprocedure Evaluation (Signed)
Anesthesia Post Note  Patient: [name]  Procedure(s) Performed: XI ROBOT ASSISTED SUTURE  RECTOPEXY (Abdomen) CYSTOSCOPY with FIREFLY INJECTION AND BILATERAL OPEN ENDED CATHETER PLACEMENT (Bilateral: Perineum)     Patient location during evaluation: PACU Anesthesia Type: General Level of consciousness: awake and alert and oriented Pain management: pain level controlled Vital Signs Assessment: post-procedure vital signs reviewed and stable Respiratory status: spontaneous breathing, nonlabored ventilation and respiratory function stable Cardiovascular status: blood pressure returned to baseline Postop Assessment: no apparent nausea or vomiting Anesthetic complications: no   No notable events documented.  Last Vitals:  Vitals:   02/09/21 1300 02/09/21 1332  BP: (!) 144/67 (!) 172/68  Pulse: 65 70  Resp: 11 16  Temp: 36.6 C 36.8 C  SpO2: 100% 100%    Last Pain:  Vitals:   02/09/21 1332  TempSrc: Oral  PainSc: [place]

## 2021-02-10 ENCOUNTER — Encounter (HOSPITAL_COMMUNITY): Payer: Self-pay | Admitting: Surgery

## 2021-02-10 LAB — BASIC METABOLIC PANEL
Anion gap: 4 — ABNORMAL LOW (ref 5–15)
BUN: 22 mg/dL (ref 8–23)
CO2: 27 mmol/L (ref 22–32)
Calcium: 8.4 mg/dL — ABNORMAL LOW (ref 8.9–10.3)
Chloride: 107 mmol/L (ref 98–111)
Creatinine, Ser: 1.1 mg/dL (ref 0.61–1.24)
GFR, Estimated: >60 mL/min (ref >60)
Glucose, Bld: 112 mg/dL — ABNORMAL HIGH (ref 70–99)
Potassium: 3.9 mmol/L (ref 3.5–5.1)
Sodium: 138 mmol/L (ref 135–145)

## 2021-02-10 LAB — CBC
HCT: 34.8 % — ABNORMAL LOW (ref 39.0–52.0)
Hemoglobin: 11.6 g/dL — ABNORMAL LOW (ref 13.0–17.0)
MCH: 33.6 pg (ref 26.0–34.0)
MCHC: 33.3 g/dL (ref 30.0–36.0)
MCV: 100.9 fL — ABNORMAL HIGH (ref 80.0–100.0)
Platelets: 165 10*3/uL (ref 150–400)
RBC: 3.45 MIL/uL — ABNORMAL LOW (ref 4.22–5.81)
RDW: 12.1 % (ref 11.5–15.5)
WBC: 7.8 10*3/uL (ref 4.0–10.5)
nRBC: 0 % (ref 0.0–0.2)

## 2021-02-10 LAB — HEMOGLOBIN A1C
Hgb A1c MFr Bld: 5.9 % — ABNORMAL HIGH (ref 4.8–5.6)
Mean Plasma Glucose: 123 mg/dL

## 2021-02-10 LAB — GLUCOSE, CAPILLARY
Glucose-Capillary: 111 mg/dL — ABNORMAL HIGH (ref 70–99)
Glucose-Capillary: 130 mg/dL — ABNORMAL HIGH (ref 70–99)
Glucose-Capillary: 184 mg/dL — ABNORMAL HIGH (ref 70–99)
Glucose-Capillary: 179 mg/dL — ABNORMAL HIGH (ref 70–99)

## 2021-02-10 NOTE — Progress Notes (Signed)
Subjective No acute events. Feeling well. Tolerating diet. Moving bowels and passing flatus. No n/v. Pain well controlled with tylenol  Objective: Vital signs in last 24 hours: Temp:  [97.7 F (36.5 C)-98.4 F (36.9 C)] 98.1 F (36.7 C) (06/16 0600) Pulse Rate:  [63-79] 71 (06/16 0600) Resp:  [9-18] 18 (06/16 0600) BP: (103-179)/(46-68) 120/49 (06/16 0600) SpO2:  [97 %-100 %] 97 % (06/16 0600) Last BM Date: 02/09/21  Intake/Output from previous day: 06/15 0701 - 06/16 0700 In: 2744 [P.O.:720; I.V.:1924; IV Piggyback:100] Out: 8657 [Urine:4050; Drains:70; Blood:65] Intake/Output this shift: No intake/output data recorded.  Gen: NAD, comfortable CV: RRR Pulm: Normal work of breathing Abd: Soft, appropriately ttp, nondistended; incisions c/d/I without erythema or drainage. Bulb drain thin and more serous than ss Ext: SCDs in place  Lab Results: CBC  Recent Labs    02/09/21 0655 02/10/21 0437  WBC 6.8 7.8  HGB 13.8 11.6*  HCT 41.2 34.8*  PLT 194 165   BMET Recent Labs    02/10/21 0437  NA 138  K 3.9  CL 107  CO2 27  GLUCOSE 112*  BUN 22  CREATININE 1.10  CALCIUM 8.4*   PT/INR No results for input(s): LABPROT, INR in the last 72 hours. ABG No results for input(s): PHART, HCO3 in the last 72 hours.  Invalid input(s): PCO2, PO2  Studies/Results:  Anti-infectives: Anti-infectives (From admission, onward)    Start     Dose/Rate Route Frequency Ordered Stop   02/09/21 0645  cefoTEtan (CEFOTAN) 2 g in sodium chloride 0.9 % 100 mL IVPB        2 g 200 mL/hr over 30 Minutes Intravenous On call to O.R. 02/09/21 8469 02/09/21 0901   02/08/21 1400  neomycin (MYCIFRADIN) tablet 1,000 mg  Status:  Discontinued       See Hyperspace for full Linked Orders Report.   1,000 mg Oral 3 times per day 02/09/21 0638 02/09/21 1323   02/08/21 1400  metroNIDAZOLE (FLAGYL) tablet 1,000 mg  Status:  Discontinued       See Hyperspace for full Linked Orders Report.   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/2013   Chronic diastolic heart failure ([place]) 01/30/2013   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/26/2012   Groin hematoma 12/26/2012   Aortic stenosis    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

## 2021-02-11 ENCOUNTER — Other Ambulatory Visit (HOSPITAL_COMMUNITY): Payer: Self-pay

## 2021-02-11 LAB — GLUCOSE, CAPILLARY
Glucose-Capillary: 123 mg/dL — ABNORMAL HIGH (ref 70–99)
Glucose-Capillary: 110 mg/dL — ABNORMAL HIGH (ref 70–99)
Glucose-Capillary: 150 mg/dL — ABNORMAL HIGH (ref 70–99)
Glucose-Capillary: 147 mg/dL — ABNORMAL HIGH (ref 70–99)

## 2021-02-11 MED ORDER — OXYCODONE HCL 5 MG PO TABS
5.0000 mg | ORAL_TABLET | Freq: Four times a day (QID) | ORAL | 0 refills | Status: AC | PRN
  Filled 2021-02-11: qty 20, 5d supply, fill #0

## 2021-02-11 NOTE — Progress Notes (Signed)
Subjective No acute events. Feeling well. Tolerating diet. Moving bowels and passing flatus. No n/v. Pain well controlled with tylenol. Urine has cleared up  Objective: Vital signs in last 24 hours: Temp:  [98.6 F (37 C)-99 F (37.2 C)] 98.8 F (37.1 C) (06/17 0546) Pulse Rate:  [67-75] 69 (06/17 0546) Resp:  [16] 16 (06/17 0546) BP: (110-126)/(46-61) 126/61 (06/17 0546) SpO2:  [94 %-96 %] 94 % (06/17 0546) Last BM Date: 02/11/21  Intake/Output from previous day: 06/16 0701 - 06/17 0700 In: 3809.6 [P.O.:2120; I.V.:1689.6] Out: 1550 [Urine:1400; Drains:150] Intake/Output this shift: Total I/O In: -  Out: 421 [Urine:400; Drains:20; Stool:1]  Gen: NAD, comfortable CV: RRR Pulm: Normal work of breathing Abd: Soft, appropriately ttp, nondistended; incisions c/d/I without erythema or drainage. Bulb drain thin and more turbid serous than ss Ext: SCDs in place  Lab Results: CBC  Recent Labs    02/09/21 0655 02/10/21 0437  WBC 6.8 7.8  HGB 13.8 11.6*  HCT 41.2 34.8*  PLT 194 165   BMET Recent Labs    02/10/21 0437  NA 138  K 3.9  CL 107  CO2 27  GLUCOSE 112*  BUN 22  CREATININE 1.10  CALCIUM 8.4*   PT/INR No results for input(s): LABPROT, INR in the last 72 hours. ABG No results for input(s): PHART, HCO3 in the last 72 hours.  Invalid input(s): PCO2, PO2  Studies/Results:  Anti-infectives: Anti-infectives (From admission, onward)    Start     Dose/Rate Route Frequency Ordered Stop   02/09/21 0645  cefoTEtan (CEFOTAN) 2 g in sodium chloride 0.9 % 100 mL IVPB        2 g 200 mL/hr over 30 Minutes Intravenous On call to O.R. 02/09/21 4854 02/09/21 0901   02/08/21 1400  neomycin (MYCIFRADIN) tablet 1,000 mg  Status:  Discontinued       See Hyperspace for full Linked Orders Report.   1,000 mg Oral 3 times per day 02/09/21 0638 02/09/21 1323   02/08/21 1400  metroNIDAZOLE (FLAGYL) tablet 1,000 mg  Status:  Discontinued       See Hyperspace for full Linked  Orders Report.   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   Vitreomacular adhesion, left 11/30/2020   Lower extremity edema 12/04/2013   Pacemaker -Medtronic    S/P AVR (aortic valve replacement) 02/21/2013   Complete heart block (HCC) 02/19/2013   Chronic diastolic heart failure ([place]) 01/30/2013   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/26/2012   Groin hematoma 12/26/2012   Aortic stenosis    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   LEUKEMIA, 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   HYPERLIPIDEMIA 12/04/2008   DEPRESSION 12/04/2008   ASTHMA, CHILDHOOD 12/04/2008   ASTHMA 12/04/2008   s/p Procedure(s): XI ROBOT ASSISTED SUTURE  RECTOPEXY CYSTOSCOPY with FIREFLY INJECTION AND BILATERAL OPEN ENDED CATHETER PLACEMENT 02/09/2021  -Continue diet as tolerated -Urine has cleared up, pink tinge but not frankly bloody; remove foley -Drain teaching -PT/OT -Reviewed plan with him, his wife and his nurse Claiborne Billings  -Ppx: SQH, SCDs -Dispo: Probable discharge home tomorrow if cleared by therapies   LOS: 2 days   Nadeen Landau, MD [facility] Use [email or web address] to contact on call provider

## 2021-02-11 NOTE — Discharge Instructions (Addendum)
POST OP INSTRUCTIONS AFTER COLON SURGERY  DIET: Be sure to include lots of fluids daily to stay hydrated - 64oz of water per day (8, 8 oz glasses).  Avoid fast food or heavy meals for the first couple of weeks as your are more likely to get nauseated. Avoid raw/uncooked fruits or vegetables for the first 4 weeks (its ok to have these if they are blended into smoothie form). If you have fruits/vegetables, make sure they are cooked until soft enough to mash on the roof of your mouth and chew your food well. Otherwise, diet as tolerated.  Take your usually prescribed home medications unless otherwise directed.  PAIN CONTROL: Pain is best controlled by a usual combination of three different methods TOGETHER: Ice/Heat Over the counter pain medication Prescription pain medication Most patients will experience some swelling and bruising around the surgical site.  Ice packs or heating pads (30-60 minutes up to 6 times a day) will help. Some people prefer to use ice alone, heat alone, alternating between ice & heat.  Experiment to what works for you.  Swelling and bruising can take several weeks to resolve.   It is helpful to take an over-the-counter pain medication regularly for the first few weeks: Ibuprofen (Motrin/Advil) - 200mg  tabs - take 3 tabs (600mg ) every 6 hours as needed for pain (unless you have been directed previously to avoid NSAIDs/ibuprofen) Acetaminophen (Tylenol) - you may take 650mg  every 6 hours as needed. You can take this with motrin as they act differently on the body. If you are taking a narcotic pain medication that has acetaminophen in it, do not take over the counter tylenol at the same time. NOTE: You may take both of these medications together - most patients find it most helpful when alternating between the two (i.e. Ibuprofen at 6am, tylenol at 9am, ibuprofen at 12pm ..Marland Kitchen) A  prescription for pain medication should be given to you upon discharge.  Take your pain medication as  prescribed if your pain is not adequatly controlled with the over-the-counter pain reliefs mentioned above.  Avoid getting constipated.  Between the surgery and the pain medications, it is common to experience some constipation.  Increasing fluid intake and taking a fiber supplement (such as Metamucil, Citrucel, FiberCon, MiraLax, etc) 1-2 times a day regularly will usually help prevent this problem from occurring. Additionally, we would prefer if you take approximately 1/2 cap of MiraLax per day for at least the first 2 months after your procedure. A mild laxative (prune juice, Milk of Magnesia, etc) should be taken according to package directions if there are no bowel movements after 48 hours.    Dressing: Your incisions are covered in Dermabond which is like sterile superglue for the skin. This will come off on it's own in a couple weeks. It is waterproof and you may bathe normally starting the day after your surgery in a shower. Avoid baths/pools/lakes/oceans until your wounds have fully healed.  Surgical drain: The tube coming from your right abdomen is a surgical 'drain.' As instructed in the hospital, empty and record this twice daily. Record the volume in milliliters if able and the character of the output (clear, yellow, red coolaid, etc) and bring a copy of this log with you to your drain check appointment. This will be sometime next week in our office.  ACTIVITIES as tolerated:   Avoid heavy lifting (>10lbs or 1 gallon of milk) for the next 6 weeks. You may resume regular daily activities as tolerated--such as  daily self-care, walking, climbing stairs--gradually increasing activities as tolerated.  If you can walk 30 minutes without difficulty, it is safe to try more intense activity such as jogging, treadmill, bicycling, low-impact aerobics.  DO NOT PUSH THROUGH PAIN.  Let pain be your guide: If it hurts to do something, don't do it. You may drive when you are no longer taking prescription  pain medication, you can comfortably wear a seatbelt, and you can safely maneuver your car and apply brakes.  FOLLOW UP in our office A drain check appointment is scheduled as a nurse visit on 02/16/21 at 2 pm Please call [facility] at [phone] to set up an appointment to see your surgeon in the office for a follow-up appointment approximately 2 weeks after your surgery. Make sure that you call for this appointment the day you arrive home to insure a convenient appointment time.  9. If you have disability or family leave forms that need to be completed, you may have them completed by your primary care physician's office; for return to work instructions, please ask our office staff and they will be happy to assist you in obtaining this documentation   When to call us [phone]: Poor pain control Reactions / problems with new medications (rash/itching, etc)  Fever over 101.5 F (38.5 C) Inability to urinate Nausea/vomiting Worsening swelling or bruising Continued bleeding from incision. Increased pain, redness, or drainage from the incision  The clinic staff is available to answer your questions during regular business hours (8:30am-5pm).  Please don't hesitate to call and ask to speak to one of our nurses for clinical concerns.   A surgeon from [facility] is always on call at the hospitals   If you have a medical emergency, go to the nearest emergency room or call 911.  [facility], [place] [address] [place], [place], [place]  [address] MAIN: [phone] FAX: [phone] [email or web address]

## 2021-02-11 NOTE — Evaluation (Signed)
Physical Therapy Evaluation Only Patient Details Name: [name] MRN: [id number] DOB: [birth year] Today's Date: 02/11/2021   History of Present Illness  80 year old male with rectal prolapse presents for rectopexy 02/09/21. PMH: asthma, CHF, DM, LBBB, aortic valve replacement 01/2019, heart cath, pacemaker placed 6/20  Clinical Impression  Pt mobilize to EOB and stands without assistance or cues. Pt ambulates in hallway pushing IV pole, clears past obstacles and makes turns without LOB. Pt reports "discomfort, not pain" at surgical sites after ambulating. Educated pt on ambulating with nursing as able- RN aware. No acute PT needs identified, will sign off at this time.    Follow Up Recommendations No PT follow up    Equipment Recommendations  None recommended by PT    Recommendations for Other Services       Precautions / Restrictions Precautions Precautions: Other (comment) Precaution Comments: JP drain Restrictions Weight Bearing Restrictions: No      Mobility  Bed Mobility Overal bed mobility: Independent   Transfers Overall transfer level: Independent Equipment used: None   Ambulation/Gait Ambulation/Gait assistance: Modified independent (Device/Increase time) Gait Distance (Feet): 350 Feet Assistive device: IV Pole Gait Pattern/deviations: WFL(Within Functional Limits) Gait velocity: WFL   General Gait Details: pt ambulates pushing IV pole, completes turns and navigates past obstacles at safe distance, no LOB  Stairs            Wheelchair Mobility    Modified Rankin (Stroke Patients Only)       Balance Overall balance assessment: No apparent balance deficits (not formally assessed)            Pertinent Vitals/Pain Pain Assessment: Faces Faces Pain Scale: Hurts a little bit Pain Location: "surgical sites" Pain Descriptors / Indicators: Discomfort;Pressure Pain Intervention(s): Limited activity within patient's tolerance;Monitored during  session    Home Living Family/patient expects to be discharged to:: Private residence Living Arrangements: Spouse/significant other Available Help at Discharge: Family;Available 24 hours/day Type of Home: House Home Access: Level entry     Home Layout: One level Home Equipment: None      Prior Function Level of Independence: Independent         Comments: Pt reports independent with ADLs/IADLs, ambulation, drives. Pt limited with standing tolerance prior to surgery so wife performed grocery shopping, household chores. 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     Hand Dominance        Extremity/Trunk Assessment   Upper Extremity Assessment Upper Extremity Assessment: Overall WFL for tasks assessed    Lower Extremity Assessment Lower Extremity Assessment: Overall WFL for tasks assessed    Cervical / Trunk Assessment Cervical / Trunk Assessment: Normal  Communication   Communication: No difficulties  Cognition Arousal/Alertness: Awake/alert Behavior During Therapy: WFL for tasks assessed/performed Overall Cognitive Status: Within Functional Limits for tasks assessed       General Comments      Exercises     Assessment/Plan    PT Assessment Patent does not need any further PT services  PT Problem List         PT Treatment Interventions      PT Goals (Current goals can be found in the Care Plan section)  Acute Rehab PT Goals Patient Stated Goal: agreeable to ambulate with therapy PT Goal Formulation: All assessment and education complete, DC therapy    Frequency     Barriers to discharge        Co-evaluation  AM-PAC PT "6 Clicks" Mobility  Outcome Measure Help needed turning from your back to your side while in a flat bed without using bedrails?: None Help needed moving from lying on your back to sitting on the side of a flat bed without using bedrails?: None Help needed moving to and from a bed  to a chair (including a wheelchair)?: None Help needed standing up from a chair using your arms (e.g., wheelchair or bedside chair)?: None Help needed to walk in hospital room?: None Help needed climbing 3-5 steps with a railing? : None 6 Click Score: 24    End of Session Equipment Utilized During Treatment: Gait belt Activity Tolerance: Patient tolerated treatment well Patient left: in bed;with call bell/phone within reach Nurse Communication: Mobility status PT Visit Diagnosis: Other abnormalities of gait and mobility (R26.89)    Time: 3200-3794 PT Time Calculation (min) (ACUTE ONLY): 15 min   Charges:   PT Evaluation $PT Eval Low Complexity: 1 Low           Tori Tejah Brekke PT, DPT 02/11/21, 3:51 PM

## 2021-02-12 LAB — GLUCOSE, CAPILLARY
Glucose-Capillary: 112 mg/dL — ABNORMAL HIGH (ref 70–99)
Glucose-Capillary: 137 mg/dL — ABNORMAL HIGH (ref 70–99)

## 2021-02-12 NOTE — Progress Notes (Signed)
Patient was given discharge instructions, and all questions were answered.  Patient was stable for discharge and was taken to the main exit by wheelchair. 

## 2021-02-12 NOTE — Discharge Summary (Signed)
Physician Discharge Summary  Patient ID: [name] MRN: [id number] DOB/AGE: 81-Sep-1942 81 y.o.  Admit date: 02/09/2021 Discharge date: 02/12/2021  Admission Diagnoses:rectal prolapse   Discharge Diagnoses:  Active Problems:   Rectal prolapse   Discharged Condition: good  Hospital Course: Patient did well after robotic rectopexy.  His bowel function returned.  His pain was well controlled.  He worked with physical therapy and could ambulate.  His he was discharged home in good condition.  Consults:  PT/OT    Treatments: surgery: Robotic rectopexy  Discharge Exam: Blood pressure (!) 115/48, pulse 78, temperature 98 F (36.7 C), temperature source Oral, resp. rate 18, height 5\' 2"  (1.575 m), weight 53.9 kg, SpO2 94 %. General appearance: alert and cooperative Resp: clear to auscultation bilaterally Cardio: regular rate and rhythm Incision/Wound: Port sites clean dry intact.  JP drain serous.  Abdomen soft nontender  Disposition: Discharge disposition: 01-Home or Self Care       Discharge Instructions     Diet - low sodium heart healthy   Complete by: As directed    Increase activity slowly   Complete by: As directed       Allergies as of 02/12/2021       Reactions   Banana Anaphylaxis   Penicillins Rash   Has patient had a PCN reaction causing immediate rash, facial/tongue/throat swelling, SOB or lightheadedness with hypotension: Unknown Has patient had a PCN reaction causing severe rash involving mucus membranes or skin necrosis: Unknown Has patient had a PCN reaction that required hospitalization: No Has patient had a PCN reaction occurring within the last 10 years: No If all of the above answers are "NO", then may proceed with Cephalosporin use.        Medication List     TAKE these medications    acetaminophen 500 MG tablet Commonly known as: TYLENOL Take 500 mg by mouth 3 (three) times daily.   aspirin 81 MG tablet Take 1 tablet (81 mg  total) by mouth daily.   busPIRone 15 MG tablet Commonly known as: BUSPAR Take 15-30 mg by mouth See admin instructions. Take 15 mg by mouth every morning and 30 mg every day at noon   cycloSPORINE 0.05 % ophthalmic emulsion Commonly known as: RESTASIS Place 1 drop into both eyes 2 (two) times daily.   fenofibrate 160 MG tablet Take 160 mg by mouth daily.   fluticasone 50 MCG/ACT nasal spray Commonly known as: FLONASE Place 2 sprays into the nose daily.   FORA V12 Blood Glucose Test test strip Generic drug: glucose blood   furosemide 40 MG tablet Commonly known as: LASIX TAKE ONE TABLET TWICE DAILY   gabapentin 300 MG capsule Commonly known as: NEURONTIN Take 300-600 mg by mouth See admin instructions. Take 300 mg by mouth each morning and 600 mg each afternoon.   ketorolac 0.5 % ophthalmic solution Commonly known as: ACULAR Place 1 drop into the right eye 4 (four) times daily.   Litetouch Lancets Misc   montelukast 10 MG tablet Commonly known as: SINGULAIR Take 10 mg by mouth daily.   multivitamin with minerals Tabs tablet Take 1 tablet by mouth daily.   oxyCODONE 5 MG immediate release tablet Commonly known as: Roxicodone Take 1 tablet by mouth every 6 hours as needed for up to 5 days (for post-op pain not controlled with tylenol and ibuprofen first).   PHILLIPS COLON HEALTH PO Take 1 capsule by mouth daily.   polyethylene glycol 17 g packet Commonly known as:  MIRALAX / GLYCOLAX Take 17 g by mouth daily.   potassium chloride SA 20 MEQ tablet Commonly known as: KLOR-CON TAKE ONE TABLET EVERY DAY   sertraline 100 MG tablet Commonly known as: ZOLOFT Take 100 mg by mouth daily.   SYSTANE OP Place 1 drop into both eyes 2 (two) times daily.   traZODone 50 MG tablet Commonly known as: DESYREL Take 150 mg by mouth at bedtime.   Vitamin D 50 MCG (2000 UT) tablet Take 2,000 Units by mouth every other day.        Follow-up Information     Ileana Roup, MD Follow up in 3 week(s).   Specialties: General Surgery, Colon and Rectal Surgery Contact information: [place] [address] [phone]         [facility], [place]. Go on 02/16/2021.   Specialty: General Surgery Why: Surgical drain check urse visit appt is scheduled 02/16/21 at 2 pm Contact information: [address] [place] [phone]                Signed: Turner Daniels 02/12/2021, 11:23 AM

## 2021-02-17 ENCOUNTER — Other Ambulatory Visit (HOSPITAL_COMMUNITY): Payer: Self-pay | Admitting: Internal Medicine

## 2021-02-18 ENCOUNTER — Other Ambulatory Visit (HOSPITAL_COMMUNITY): Payer: Self-pay

## 2021-02-21 DIAGNOSIS — E538 Deficiency of other specified B group vitamins: Secondary | ICD-10-CM | POA: Diagnosis not present

## 2021-03-18 ENCOUNTER — Ambulatory Visit (INDEPENDENT_AMBULATORY_CARE_PROVIDER_SITE_OTHER): Payer: Medicare Other

## 2021-03-18 DIAGNOSIS — I442 Atrioventricular block, complete: Secondary | ICD-10-CM | POA: Diagnosis not present

## 2021-03-21 ENCOUNTER — Ambulatory Visit (INDEPENDENT_AMBULATORY_CARE_PROVIDER_SITE_OTHER): Payer: Medicare Other | Admitting: Ophthalmology

## 2021-03-21 ENCOUNTER — Other Ambulatory Visit: Payer: Self-pay

## 2021-03-21 ENCOUNTER — Encounter (INDEPENDENT_AMBULATORY_CARE_PROVIDER_SITE_OTHER): Payer: Self-pay | Admitting: Ophthalmology

## 2021-03-21 DIAGNOSIS — H35371 Puckering of macula, right eye: Secondary | ICD-10-CM | POA: Diagnosis not present

## 2021-03-21 DIAGNOSIS — H35351 Cystoid macular degeneration, right eye: Secondary | ICD-10-CM | POA: Diagnosis not present

## 2021-03-21 DIAGNOSIS — H4321 Crystalline deposits in vitreous body, right eye: Secondary | ICD-10-CM | POA: Diagnosis not present

## 2021-03-21 LAB — CUP PACEART REMOTE DEVICE CHECK
Battery Impedance: 1022 Ohm
Battery Remaining Longevity: 65 mo
Battery Voltage: 2.78 V
Brady Statistic AP VP Percent: 47 %
Brady Statistic AP VS Percent: 0 %
Brady Statistic AS VP Percent: 53 %
Brady Statistic AS VS Percent: 0 %
Date Time Interrogation Session: 20220722132456
Implantable Lead Implant Date: 20140625
Implantable Lead Implant Date: 20140625
Implantable Lead Location: 753859
Implantable Lead Location: 753860
Implantable Lead Model: 5076
Implantable Lead Model: 5076
Implantable Pulse Generator Implant Date: 20140625
Lead Channel Impedance Value: 510 Ohm
Lead Channel Impedance Value: 511 Ohm
Lead Channel Pacing Threshold Amplitude: 0.5 V
Lead Channel Pacing Threshold Amplitude: 0.625 V
Lead Channel Pacing Threshold Pulse Width: 0.4 ms
Lead Channel Pacing Threshold Pulse Width: 0.4 ms
Lead Channel Setting Pacing Amplitude: 1.5 V
Lead Channel Setting Pacing Amplitude: 2 V
Lead Channel Setting Pacing Pulse Width: 0.4 ms
Lead Channel Setting Sensing Sensitivity: 2 mV

## 2021-03-21 NOTE — Assessment & Plan Note (Signed)
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  Not the impediment to acuity but some impact on acuity with shadowing seen cast.  I did explain the patient that an added benefit of vitrectomy to remove the epiretinal membrane will remove the floaters and diminish moderate effect it has on acuity

## 2021-03-21 NOTE — Patient Instructions (Signed)
The nature of macular pucker (epiretinal membrane ERM) was discussed with the patient as well as threshold criteria for vitrectomy surgery. I explained that in rare cases another surgery is needed to actually remove a second wrinkle should it regrow.  Most often, the epiretinal membrane and underlying wrinkled internal limiting membrane are removed with the first surgery, to accomplish the goals.   If the operative eye is Phak[place] (natural lens still present), cataract surgery is often recommended prior to Vitrectomy. This will enable the retina surgeon to have the best view during surgery and the patient to obtain optimal results in the future. Treatment options were discussed.    Patient has been offered April 06, 2021, 1:30 PM for surgical intervention, [facility] off of [address].

## 2021-03-21 NOTE — Progress Notes (Signed)
03/21/2021     CHIEF COMPLAINT Patient presents for Retina Follow Up (6 week fu OD and OCT/Pt states VA OU stable since last visit. Pt denies FOL, floaters, or ocular pain OU. [name]: 6.1/LBS: 104/Pt is not using any gtts at this time)   HISTORY OF PRESENT ILLNESS: [name] is a 80 y.o. male who presents to the clinic today for:   HPI     Retina Follow Up           Laterality: right eye   Onset: 6 weeks ago   Severity: mild   Duration: 6 weeks   Course: stable   Comments: 6 week fu OD and OCT Pt states VA OU stable since last visit. Pt denies FOL, floaters, or ocular pain OU.  A1C: 6.1 LBS: 104 Pt is not using any gtts at this time         Comments   Patient reports intermittently having to cover the right eye because of the cloudy vision.      Last edited by Tyler Horn, MD on 03/21/2021  2:03 PM.      Referring physician: Jonathon Jordan, MD [name] [address],  [place] [address]  HISTORICAL INFORMATION:   Selected notes from the MEDICAL RECORD NUMBER    Lab Results  Component Value Date   HGBA1C 5.9 (H) 02/09/2021     CURRENT MEDICATIONS: Current Outpatient Medications (Ophthalmic Drugs)  Medication Sig   cycloSPORINE (RESTASIS) 0.05 % ophthalmic emulsion Place 1 drop into both eyes 2 (two) times daily.   ketorolac (ACULAR) 0.5 % ophthalmic solution Place 1 drop into the right eye 4 (four) times daily.   Polyethyl Glycol-Propyl Glycol (SYSTANE OP) Place 1 drop into both eyes 2 (two) times daily.   No current facility-administered medications for this visit. (Ophthalmic Drugs)   Current Outpatient Medications (Other)  Medication Sig   acetaminophen (TYLENOL) 500 MG tablet Take 500 mg by mouth 3 (three) times daily.   aspirin 81 MG tablet Take 1 tablet (81 mg total) by mouth daily.   busPIRone (BUSPAR) 15 MG tablet Take 15-30 mg by mouth See admin instructions. Take 15 mg by mouth every morning and 30 mg every day at noon    Cholecalciferol (VITAMIN D) 2000 units tablet Take 2,000 Units by mouth every other day.    fenofibrate 160 MG tablet Take 160 mg by mouth daily.   fluticasone (FLONASE) 50 MCG/ACT nasal spray Place 2 sprays into the nose daily.   FORA V12 BLOOD GLUCOSE TEST test strip    furosemide (LASIX) 40 MG tablet TAKE ONE TABLET TWICE DAILY   gabapentin (NEURONTIN) 300 MG capsule Take 300-600 mg by mouth See admin instructions. Take 300 mg by mouth each morning and 600 mg each afternoon.   LITETOUCH LANCETS MISC    montelukast (SINGULAIR) 10 MG tablet Take 10 mg by mouth daily.   Multiple Vitamin (MULTIVITAMIN WITH MINERALS) TABS tablet Take 1 tablet by mouth daily.   polyethylene glycol (MIRALAX / GLYCOLAX) 17 g packet Take 17 g by mouth daily.   potassium chloride SA (K-DUR,KLOR-CON) 20 MEQ tablet TAKE ONE TABLET EVERY DAY   Probiotic Product (PHILLIPS COLON HEALTH PO) Take 1 capsule by mouth daily.   sertraline (ZOLOFT) 100 MG tablet Take 100 mg by mouth daily.   traZODone (DESYREL) 50 MG tablet Take 150 mg by mouth at bedtime.   No current facility-administered medications for this visit. (Other)  REVIEW OF SYSTEMS:    ALLERGIES Allergies  Allergen Reactions   Banana Anaphylaxis   Penicillins Rash    Has patient had a PCN reaction causing immediate rash, facial/tongue/throat swelling, SOB or lightheadedness with hypotension: Unknown Has patient had a PCN reaction causing severe rash involving mucus membranes or skin necrosis: Unknown Has patient had a PCN reaction that required hospitalization: No Has patient had a PCN reaction occurring within the last 10 years: No If all of the above answers are "NO", then may proceed with Cephalosporin use.     PAST MEDICAL HISTORY Past Medical History:  Diagnosis Date   Adverse effect of anesthesia    Small airway   Aortic stenosis    s/p AVR 6/14   Arthritis    Asthma    Atrioventricular block, complete ([place])    a. s/p MDT dual  chamber PPM 2014   Cataract 09/2020   bilateral   CHF (congestive heart failure) ([place])    Chronic lymphocytic leukemia ([place])    followed by [name]   Depression    Difficult intubation 02/09/2021   Anterior Airway.   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   Past Surgical History:  Procedure Laterality Date   AORTIC VALVE REPLACEMENT N/A 02/17/2013   Procedure: AORTIC VALVE REPLACEMENT (AVR);  Surgeon: Tyler Pollack, MD;  Location: [place];  Service: Open Heart Surgery;  Laterality: N/A;   CYST REMOVAL NECK     EYE SURGERY     INNER EAR SURGERY     INTRAOPERATIVE TRANSESOPHAGEAL ECHOCARDIOGRAM N/A 02/17/2013   Procedure: INTRAOPERATIVE TRANSESOPHAGEAL ECHOCARDIOGRAM;  Surgeon: Tyler Pollack, MD;  Location[facility] OR;  Service: Open Heart Surgery;  Laterality: N/A;   LEFT AND RIGHT HEART CATHETERIZATION WITH CORONARY ANGIOGRAM N/A 12/25/2012   Procedure: LEFT AND RIGHT HEART CATHETERIZATION WITH CORONARY ANGIOGRAM;  Surgeon: Tyler Artist, MD;  Location: [facility] CATH LAB;  Service: Cardiovascular;  Laterality: N/A;   PERMANENT PACEMAKER INSERTION N/A 02/19/2013   Procedure: PERMANENT PACEMAKER INSERTION;  Surgeon: Tyler Sprang, MD;  Location: [facility] CATH LAB;  Service: Cardiovascular;  Laterality: N/A;   RECTAL BIOPSY N/A 10/08/2017   Procedure: EXCISION OF ANAL POLYP ;  Surgeon: Tyler Roup, MD;  [facility]: [facility];  Service: General;  Laterality: N/A;   stapendectomy  07/30/02   right   XI ROBOT ASSISTED RECTOPEXY N/A 02/09/2021   Procedure: XI ROBOT ASSISTED SUTURE  RECTOPEXY;  Surgeon: Tyler Roup, MD;  Location: [facility];  Service: General;  Laterality: N/A;   XRT and surgical resection     s/p. 10 years ago    FAMILY HISTORY Family History   Problem Relation Age of Onset   Brain cancer Mother    Cancer Father        lung    SOCIAL HISTORY Social History   Tobacco Use   Smoking status: Never   Smokeless tobacco: Never  Vaping Use   Vaping Use: Never used  Substance Use Topics   Alcohol use: No   Drug use: No         OPHTHALMIC EXAM:  Base Eye Exam     Visual Acuity (ETDRS)  Right Left   Dist cc 20/50 -1 20/50   Dist ph cc NI 20/25 +2         Tonometry (Tonopen, 1:42 PM)       Right Left   Pressure 10 11         Pupils       Pupils Dark Light Shape React APD   Right PERRL 6 5 Round Slow None   Left PERRL 6 5 Round Slow None         Visual Fields (Counting fingers)       Left Right    Full Full         Extraocular Movement       Right Left    Full Full         Neuro/Psych     Oriented x3: Yes   Mood/Affect: Normal         Dilation     Right eye: 1.0% Mydriacyl, 2.5% Phenylephrine @ 1:42 PM           Slit Lamp and Fundus Exam     External Exam       Right Left   External Normal Normal         Slit Lamp Exam       Right Left   Lids/Lashes Normal Normal   Conjunctiva/Sclera White and quiet White and quiet   Cornea Clear Clear   Anterior Chamber Deep and quiet Deep and quiet   Iris Round and reactive Round and reactive   Lens Centered posterior chamber intraocular lens Centered posterior chamber intraocular lens   Anterior Vitreous Asteroid hyalosis Normal         Fundus Exam       Right Left   Posterior Vitreous Asteroid hyalosis 2+    Disc Peripapillary atrophy Not dilated   C/D Ratio 0.4    Macula Epiretinal membrane, moderate topographic distortion    Vessels Normal    Periphery Normal             IMAGING AND PROCEDURES  Imaging and Procedures for 03/21/21  OCT, Retina - OU - Both Eyes       Right Eye Quality was good. Scan locations included subfoveal. Central Foveal Thickness: 399. Progression has no prior data.  Findings include abnormal foveal contour, cystoid macular edema, epiretinal membrane, vitreomacular adhesion .   Left Eye Quality was good. Scan locations included subfoveal. Central Foveal Thickness: 297. Progression has no prior data. Findings include vitreomacular adhesion , normal foveal contour.   Notes CME with overlying epiretinal membrane OD, with elevation of the foveal depression as if the epiretinal membrane is a component of visual difficulty and or en face vitreomacular adhesion  OS with incidental VMA, no topographic distortions             ASSESSMENT/PLAN:  Asteroid hyalosis of right eye Moderate to severe asteroid hyalosis, floaters.  Not the impediment to acuity but some impact on acuity with shadowing seen cast.  I did explain the patient that an added benefit of vitrectomy to remove the epiretinal membrane will remove the floaters and diminish moderate effect it has on acuity  Cystoid macular edema, right eye CME OD has remained resolved after topical ketorolac 4 times daily and then undergoing a tapering dose.  Nonetheless residual acuity of 20/50 is maximal acuity obtainable with the overlying epiretinal membrane noted on OCT  Macular pucker, right eye Severe macular thickening and elevation of the fovea demonstrated with the  patient with a maximum centerThickness of 406.  I have recommended to the patient that with his visual acuity impairment particularly reading, the vitrectomy membrane peel is his only chance to try to allow for the visual acuity improvement to occur.  Risk and benefits will be reviewed.  Patient will make his decision when this condition impacts quality of his life.  Currently he is actually sometimes closing the right eye or covering it.     ICD-10-CM   1. Cystoid macular edema, right eye  H35.351 OCT, Retina - OU - Both Eyes    2. Asteroid hyalosis of right eye  H43.21     3. Macular pucker, right eye  H35.371       1.  OD,  resolved CME from pseudophakia.  Nonetheless persistent macular thickening from epiretinal membrane at impact on acuity present.  In addition asteroid hyalosis is present.  Each of these conditions can be additive in terms of visual acuity impact however most likely is the epiretinal membrane given the overall thickness and outer foveal changes seen In the subfoveal location at the level of the photoreceptor layer.  2.  Discussion and risks vitrectomy membrane peel the right eye will be reviewed.  3.  I explained the patient that surgical intervention is typically a 15 to 20-minute surgical undertaking under local anesthesia monitored anesthesia control as an outpatient.  He would wear a patch for 1 night with topical drop medications use for total 3 weeks.  He will be on limited activity for only 10 days.  Ophthalmic Meds Ordered this visit:  No orders of the defined types were placed in this encounter.      Return ,, [facility], for Schedule vitrectomy membrane peel, [address] no gas injection.  Patient Instructions  The nature of macular pucker (epiretinal membrane ERM) was discussed with the patient as well as threshold criteria for vitrectomy surgery. I explained that in rare cases another surgery is needed to actually remove a second wrinkle should it regrow.  Most often, the epiretinal membrane and underlying wrinkled internal limiting membrane are removed with the first surgery, to accomplish the goals.   If the operative eye is Phak[place] (natural lens still present), cataract surgery is often recommended prior to Vitrectomy. This will enable the retina surgeon to have the best view during surgery and the patient to obtain optimal results in the future. Treatment options were discussed.    Patient has been offered April 06, 2021, 1:30 PM for surgical intervention, [facility] off of [address].       Explained the diagnoses, plan, and follow up with the  patient and they expressed understanding.  Patient expressed understanding of the importance of proper follow up care.   Clent Demark Linell Shawn M.D. Diseases & Surgery of the Retina and Vitreous [facility] [place] 03/21/21     Abbreviations: M myopia (nearsighted); A astigmatism; H hyperopia (farsighted); P presbyopia; Mrx spectacle prescription;  CTL contact lenses; OD right eye; OS left eye; OU both eyes  XT exotropia; ET esotropia; PEK punctate epithelial keratitis; PEE punctate epithelial erosions; DES dry eye syndrome; MGD meibomian gland dysfunction; ATs artificial tears; PFAT's preservative free artificial tears; [place] nuclear sclerotic cataract; PSC posterior subcapsular cataract; ERM epi-retinal membrane; PVD posterior vitreous detachment; RD retinal detachment; DM diabetes mellitus; DR diabetic retinopathy; NPDR non-proliferative diabetic retinopathy; PDR proliferative diabetic retinopathy; CSME clinically significant macular edema; DME diabetic macular edema; dbh dot blot hemorrhages; CWS cotton wool  spot; POAG primary open angle glaucoma; C/D cup-to-disc ratio; HVF humphrey visual field; GVF goldmann visual field; OCT optical coherence tomography; IOP intraocular pressure; BRVO Branch retinal vein occlusion; CRVO central retinal vein occlusion; CRAO central retinal artery occlusion; BRAO branch retinal artery occlusion; RT retinal tear; SB scleral buckle; PPV pars plana vitrectomy; VH Vitreous hemorrhage; PRP panretinal laser photocoagulation; IVK intravitreal kenalog; VMT vitreomacular traction; MH Macular hole;  NVD neovascularization of the disc; NVE neovascularization elsewhere; AREDS age related eye disease study; ARMD age related macular degeneration; POAG primary open angle glaucoma; EBMD epithelial/anterior basement membrane dystrophy; ACIOL anterior chamber intraocular lens; IOL intraocular lens; PCIOL posterior chamber intraocular lens; Phaco/IOL phacoemulsification with intraocular  lens placement; [place] photorefractive keratectomy; LASIK laser assisted in situ keratomileusis; HTN hypertension; DM diabetes mellitus; COPD chronic obstructive pulmonary disease

## 2021-03-21 NOTE — Assessment & Plan Note (Signed)
Severe macular thickening and elevation of the fovea demonstrated with the patient with a maximum centerThickness of 406.  I have recommended to the patient that with his visual acuity impairment particularly reading, the vitrectomy membrane peel is his only chance to try to allow for the visual acuity improvement to occur.  Risk and benefits will be reviewed.  Patient will make his decision when this condition impacts quality of his life.  Currently he is actually sometimes closing the right eye or covering it.

## 2021-03-21 NOTE — Assessment & Plan Note (Signed)
CME OD has remained resolved after topical ketorolac 4 times daily and then undergoing a tapering dose.  Nonetheless residual acuity of 20/50 is maximal acuity obtainable with the overlying epiretinal membrane noted on OCT

## 2021-03-24 DIAGNOSIS — E538 Deficiency of other specified B group vitamins: Secondary | ICD-10-CM | POA: Diagnosis not present

## 2021-03-25 ENCOUNTER — Ambulatory Visit: Payer: Medicare Other | Attending: Surgery | Admitting: Physical Therapy

## 2021-03-25 ENCOUNTER — Other Ambulatory Visit: Payer: Self-pay

## 2021-03-25 ENCOUNTER — Encounter: Payer: Self-pay | Admitting: Physical Therapy

## 2021-03-25 DIAGNOSIS — R293 Abnormal posture: Secondary | ICD-10-CM | POA: Diagnosis not present

## 2021-03-25 DIAGNOSIS — M6281 Muscle weakness (generalized): Secondary | ICD-10-CM | POA: Insufficient documentation

## 2021-03-26 NOTE — Therapy (Signed)
[facility] [address], [place], [place], [address] Phone: [phone]   Fax:  [phone]  Physical Therapy Evaluation  Patient Details  Name: [name] MRN: [id number] Date of Birth: Aug 12, 1941 Referring Provider (PT): Ileana Roup, MD   Encounter Date: 03/25/2021   PT End of Session - 03/25/21 0856     Visit Number 1    Date for PT Re-Evaluation 06/17/21    Authorization Type medicare    PT Start Time 0847    PT Stop Time 0930    PT Time Calculation (min) 43 min    Activity Tolerance Patient tolerated treatment well    Behavior During Therapy [facility] for tasks assessed/performed             Past Medical History:  Diagnosis Date   Adverse effect of anesthesia    Small airway   Aortic stenosis    s/p AVR 6/14   Arthritis    Asthma    Atrioventricular block, complete ([place])    a. s/p MDT dual chamber PPM 2014   Cataract 09/2020   bilateral   CHF (congestive heart failure) ([place])    Chronic lymphocytic leukemia ([place])    followed by Dr. Ouida Sills   Depression    Difficult intubation 02/09/2021   Anterior Airway.   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    Past Surgical History:  Procedure Laterality Date   AORTIC VALVE REPLACEMENT N/A 02/17/2013   Procedure: AORTIC VALVE REPLACEMENT (AVR);  Surgeon: Gaye Pollack, MD;  Location: [place];  Service: Open Heart Surgery;  Laterality: N/A;   CYST REMOVAL NECK     EYE SURGERY     INNER EAR SURGERY     INTRAOPERATIVE TRANSESOPHAGEAL ECHOCARDIOGRAM N/A 02/17/2013   Procedure: INTRAOPERATIVE TRANSESOPHAGEAL ECHOCARDIOGRAM;  Surgeon: Gaye Pollack, MD;  Location: [facility] OR;  Service: Open Heart Surgery;  Laterality: N/A;    LEFT AND RIGHT HEART CATHETERIZATION WITH CORONARY ANGIOGRAM N/A 12/25/2012   Procedure: LEFT AND RIGHT HEART CATHETERIZATION WITH CORONARY ANGIOGRAM;  Surgeon: Jolaine Artist, MD;  Location: [facility] CATH LAB;  Service: Cardiovascular;  Laterality: N/A;   PERMANENT PACEMAKER INSERTION N/A 02/19/2013   Procedure: PERMANENT PACEMAKER INSERTION;  Surgeon: Deboraha Sprang, MD;  Location: [facility] CATH LAB;  Service: Cardiovascular;  Laterality: N/A;   RECTAL BIOPSY N/A 10/08/2017   Procedure: EXCISION OF ANAL POLYP ;  Surgeon: Ileana Roup, MD;  [facility];  Service: General;  Laterality: N/A;   stapendectomy  07/30/02   right   XI ROBOT ASSISTED RECTOPEXY N/A 02/09/2021   Procedure: XI ROBOT ASSISTED SUTURE  RECTOPEXY;  Surgeon: Ileana Roup, MD;  [facility];  Service: General;  Laterality: N/A;   XRT and surgical resection     s/p. 10 years ago    There were no vitals filed for this visit.    Subjective Assessment - 03/25/21 0850     Subjective Pt states after standing for about an hour and half.  It is a full fecal leakage.  I am working for 3 hours a day but it is hard to do with the leakage.  I wear a  guard in pants.  I have leakage about 1x during the work day and 2x if I am working a little longer.  I use the water to clean everything out at night and don't usually leak at night.  I take mirilax in the morning and eat breakfast then things get going.    Limitations Standing;Walking    How long can you stand comfortably? 1.5 hours    How long can you walk comfortably? 1.5 hours    Patient Stated Goals reduce or eliminate leakage of stool    Currently in Pain? No/denies                [facility] PT Assessment - 03/26/21 0001       Assessment   Medical Diagnosis K62.3 (ICD-10-CM) - Rectal prolapse    Referring Provider (PT) Ileana Roup, MD      Precautions   Precautions None      Balance Screen   Has the patient fallen in the past 6 months No      [place] residence    Living Arrangements Spouse/significant other   dog     Prior Function   Level of Independence Independent    Vocation Part time employment    Vocation Requirements standing/walking      Cognition   Overall Cognitive Status Within Functional Limits for tasks assessed      Posture/Postural Control   Posture/Postural Control Postural limitations    Postural Limitations Rounded Shoulders;Increased thoracic kyphosis;Posterior pelvic tilt;Flexed trunk      ROM / Strength   AROM / PROM / Strength PROM;Strength      PROM   Overall PROM Comments hip IR 50% Lt 60% Rt      Strength   Overall Strength Comments 4+/5 grossly      Flexibility   Soft Tissue Assessment /Muscle Length yes    Hamstrings 80%      Palpation   Palpation comment gluteals and lumbar erectors tight      Ambulation/Gait   Gait Pattern Trunk flexed                        Objective measurements completed on examination: See above findings.     Pelvic Floor Special Questions - 03/26/21 0001     Fecal incontinence Yes    Pelvic Floor Internal Exam pt informed and consent given to perform assessment of anal sphincter muscles    Exam Type Rectal    Strength weak squeeze, no lift    Tone high >54m resting; holding 3 sec    Biofeedback yes with high resting tone as mentioned above    Biofeedback sensor type Surface              [facility] Adult PT Treatment/Exercise - 03/26/21 0001       Self-Care   Self-Care Other Self-Care Comments    Other Self-Care Comments  HEP                    PT Education - 03/26/21 1614     Education Details Access Code: [email or web address]   Person(s) Educated Patient    Methods Explanation;Demonstration;Tactile cues;Verbal cues;Handout    Comprehension Verbalized understanding;Returned demonstration                 PT Long Term Goals - 03/26/21 1554       PT LONG TERM GOAL #1   Title  Pt will  be ind with HEP to return to maximum function at work    Time 12    Period Weeks    Status New    Target Date 06/17/21      PT LONG TERM GOAL #2   Title Pt will be able to stand up for 3-4 hours in order to complete at least half shift at work without interruptions from fecal leakage    Time 12    Period Weeks    Status New    Target Date 06/17/21      PT LONG TERM GOAL #3   Title Pt will have at Cut [place] 50% improved sensation of when he has to have a BM.    Time 12    Period Weeks    Status New    Target Date 06/17/21                    Plan - 03/26/21 1615     Clinical Impression Statement Pt presents to clinic s/p surgical repair of rectal prolapse.  Pt has pelvic floor weakness af anal sphincters.  Pt gets symptoms of fecal incontinence after 1-2 hours in standing/walking.  Pt also demonstrates excess thoracic kyphosis, tight pecs, rounded shoulder, tension in lumbar erectors and hip flexors.  Pt was given biofeedback assessment and has a high resting tone of pelvic floor causing overall weakness.  Pt able to sustain a contraction for only 3 seconds before releases.  Pt will benefit from skilled PT to address muscle tension, coordination, and weakness and other impairments as noted above.    Personal Factors and Comorbidities Comorbidity 3+    Comorbidities CHF, pace maker, anal polyp removed 2019, lymphotic leukemia, testicular cancer, rectopexy 02/09/2021    Examination-Activity Limitations Continence;Toileting;Stand    Examination-Participation Restrictions Occupation;Community Activity    Stability/Clinical Decision Making Evolving/Moderate complexity    Clinical Decision Making Moderate    Rehab Potential Excellent    PT Frequency 1x / week    PT Duration 12 weeks    PT Treatment/Interventions ADLs/Self Care Home Management;Biofeedback;Cryotherapy;Electrical Stimulation;Moist Heat;Therapeutic activities;Therapeutic exercise;Neuromuscular re-education;Patient/family  education;Manual techniques;Taping;Dry needling;Passive range of motion    PT Next Visit Plan thoracic extension ex's, kegel isolation, lumbar and gluteal release    PT Home Exercise Plan Access Code: [email or web address]    Consulted and Agree with Plan of Care Patient             Patient will benefit from skilled therapeutic intervention in order to improve the following deficits and impairments:  Abnormal gait, Decreased strength, Postural dysfunction, Impaired flexibility, Increased fascial restricitons, Decreased coordination, Decreased range of motion, Increased muscle spasms, Decreased endurance  Visit Diagnosis: Muscle weakness (generalized)  Abnormal posture     Problem List Patient Active Problem List   Diagnosis Date Noted   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   Macular pucker, right eye 11/30/2020   Cystoid macular edema, right eye 11/30/2020   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   Vitreomacular adhesion, left 11/30/2020   Lower extremity edema 12/04/2013   Pacemaker -Medtronic    S/P AVR (aortic valve replacement) 02/21/2013   Complete heart block (HCC) 02/19/2013   Chronic diastolic heart failure ([place]) 01/30/2013   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/26/2012   Groin hematoma 12/26/2012   Aortic stenosis    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 [place], [place], [address] Phone: [phone]   Fax:  [phone]  Name: [name] MRN: [id number] Date of Birth: 1940/11/12

## 2021-03-26 NOTE — Patient Instructions (Signed)
Access Code: [email or web address] URL: [email or web address] Date: 03/26/2021 Prepared by: Jari Favre  Exercises Supine Bridge with Pelvic Floor Contraction and Hip Rotation - 1 x daily - 7 x weekly - 3 sets - 10 reps Open Book Chest Stretch on Towel Roll - 1 x daily - 7 x weekly - 1 sets - 5 reps - 10 hold

## 2021-03-28 ENCOUNTER — Other Ambulatory Visit: Payer: Self-pay

## 2021-03-28 ENCOUNTER — Ambulatory Visit: Payer: Medicare Other | Attending: Surgery | Admitting: Physical Therapy

## 2021-03-28 DIAGNOSIS — M6281 Muscle weakness (generalized): Secondary | ICD-10-CM | POA: Insufficient documentation

## 2021-03-28 DIAGNOSIS — R293 Abnormal posture: Secondary | ICD-10-CM | POA: Insufficient documentation

## 2021-03-28 NOTE — Patient Instructions (Addendum)
Toileting Techniques for Bowel Movements (Defecation) Using your belly (abdomen) and pelvic floor muscles to have a bowel movement is usually instinctive.  Sometimes people can have problems with these muscles and have to relearn proper defecation (emptying) techniques.  If you have weakness in your muscles, organs that are falling out, decreased sensation in your pelvis, or ignore your urge to go, you may find yourself straining to have a bowel movement.  You are straining if you are: holding your breath or taking in a huge gulp of air and holding it  keeping your lips and jaw tensed and closed tightly turning red in the face because of excessive pushing or forcing developing or worsening your  hemorrhoids getting faint while pushing not emptying completely and have to defecate many times a day  If you are straining, you are actually making it harder for yourself to have a bowel movement.  Many people find they are pulling up with the pelvic floor muscles and closing off instead of opening the anus. Due to lack pelvic floor relaxation and coordination the abdominal muscles, one has to work harder to push the feces out.  Many people have never been taught how to defecate efficiently and effectively.  Notice what happens to your body when you are having a bowel movement.  While you are sitting on the toilet pay attention to the following areas: Jaw and mouth position Angle of your hips   Whether your feet touch the ground or not Arm placement  Spine position Waist Belly tension Anus (opening of the anal canal)  An Evacuation/Defecation Plan   Here are the 4 basic points:  Lean forward enough for your elbows to rest on your knees Support your feet on the floor or use a low stool if your feet don't touch the floor  Push out your belly as if you have swallowed a beach ball--you should feel a widening of your waist Open and relax your pelvic floor muscles, rather than tightening around the  anus       The following conditions my require modifications to your toileting posture:  If you have had surgery in the past that limits your back, hip, pelvic, knee or ankle flexibility Constipation   Your healthcare practitioner may make the following additional suggestions and adjustments:  Sit on the toilet  a) Make sure your feet are supported. b) Notice your hip angle and spine position--most people find it effective to lean forward or raise their knees, which can help the muscles around the anus to relax  c) When you lean forward, place your forearms on your thighs for support  Relax suggestions a) Breath deeply in through your nose and out slowly through your mouth as if you are smelling the flowers and blowing out the candles. b) To become aware of how to relax your muscles, contracting and releasing muscles can be helpful.  Pull your pelvic floor muscles in tightly by using the image of holding back gas, or closing around the anus (visualize making a circle smaller) and lifting the anus up and in.  Then release the muscles and your anus should drop down and feel open. Repeat 5 times ending with the feeling of relaxation. c) Keep your pelvic floor muscles relaxed; let your belly bulge out. d) The digestive tract starts at the mouth and ends at the anal opening, so be sure to relax both ends of the tube.  Place your tongue on the roof of your mouth with your teeth  separated.  This helps relax your mouth and will help to relax the anus at the same time.  Empty (defecation) a) Keep your pelvic floor and sphincter relaxed, then bulge your anal muscles.  Make the anal opening wide.  b) Stick your belly out as if you have swallowed a beach ball. c) Make your belly wall hard using your belly muscles while continuing to breathe. Doing this makes it easier to open your anus. d) Breath out and give a grunt (or try using other sounds such as ahhhh, shhhhh, ohhhh or  grrrrrrr).  4) Finish a) As you finish your bowel movement, pull the pelvic floor muscles up and in.  This will leave your anus in the proper place rather than remaining pushed out and down. If you leave your anus pushed out and down, it will start to feel as though that is normal and give you incorrect signals about needing to have a bowel movement. Access Code: [email or web address] URL: [email or web address] Date: 03/28/2021 Prepared by: Jari Favre  Exercises Supine Bridge with Pelvic Floor Contraction and Hip Rotation - 1 x daily - 7 x weekly - 3 sets - 10 reps Open Book Chest Stretch on Towel Roll - 1 x daily - 7 x weekly - 1 sets - 5 reps - 10 hold Hip Abduction with Resistance Loop - 1 x daily - 7 x weekly - 3 sets - 10 reps Mini Squat with Pelvic Floor Contraction - 1 x daily - 7 x weekly - 3 sets - 10 reps Standing Shoulder Row with Anchored Resistance - 1 x daily - 7 x weekly - 3 sets - 10 reps Doorway Pec Stretch at 60 Elevation - 1 x daily - 7 x weekly - 3 sets - 10 reps

## 2021-03-28 NOTE — Therapy (Signed)
[facility] [address] [place], [place], [address] Phone: [phone]   Fax:  [phone]  Physical Therapy Treatment  Patient Details  Name: [name] MRN: [id number] Date of Birth: Sep 25, 1940 Referring Provider (PT): Ileana Roup, MD   Encounter Date: 03/28/2021   PT End of Session - 03/28/21 0936     Visit Number 2    Date for PT Re-Evaluation 06/17/21    Authorization Type medicare    PT Start Time 0930    PT Stop Time 1012    PT Time Calculation (min) 42 min    Activity Tolerance Patient tolerated treatment well    Behavior During Therapy [facility] for tasks assessed/performed             Past Medical History:  Diagnosis Date   Adverse effect of anesthesia    Small airway   Aortic stenosis    s/p AVR 6/14   Arthritis    Asthma    Atrioventricular block, complete ([place])    a. s/p MDT dual chamber PPM 2014   Cataract 09/2020   bilateral   CHF (congestive heart failure) ([place])    Chronic lymphocytic leukemia ([place])    followed by Dr. Ouida Sills   Depression    Difficult intubation 02/09/2021   Anterior Airway.   DM2 (diabetes mellitus, type 2) (HCC)    fasting blood sugar - 90s   Exposure to secondhand smoke    extensive exposure   Heart murmur    Hyperlipidemia    treated   LBBB (left bundle branch block)    Testicular cancer [facility])    s/p XRT and surgical resection.    Past Surgical History:  Procedure Laterality Date   AORTIC VALVE REPLACEMENT N/A 02/17/2013   Procedure: AORTIC VALVE REPLACEMENT (AVR);  Surgeon: Gaye Pollack, MD;  Location: [facility];  Service: Open Heart Surgery;  Laterality: N/A;   CYST REMOVAL NECK     EYE SURGERY     INNER EAR SURGERY     INTRAOPERATIVE TRANSESOPHAGEAL ECHOCARDIOGRAM N/A 02/17/2013   Procedure: INTRAOPERATIVE TRANSESOPHAGEAL ECHOCARDIOGRAM;  Surgeon: Gaye Pollack, MD;  Location: [facility] OR;  Service: Open Heart Surgery;  Laterality: N/A;   LEFT  AND RIGHT HEART CATHETERIZATION WITH CORONARY ANGIOGRAM N/A 12/25/2012   Procedure: LEFT AND RIGHT HEART CATHETERIZATION WITH CORONARY ANGIOGRAM;  Surgeon: Jolaine Artist, MD;  Location: [facility] CATH LAB;  Service: Cardiovascular;  Laterality: N/A;   PERMANENT PACEMAKER INSERTION N/A 02/19/2013   Procedure: PERMANENT PACEMAKER INSERTION;  Surgeon: Deboraha Sprang, MD;  Location: [facility] CATH LAB;  Service: Cardiovascular;  Laterality: N/A;   RECTAL BIOPSY N/A 10/08/2017   Procedure: EXCISION OF ANAL POLYP ;  Surgeon: Ileana Roup, MD;  Location: [facility];  Service: General;  Laterality: N/A;   stapendectomy  07/30/02   right   XI ROBOT ASSISTED RECTOPEXY N/A 02/09/2021   Procedure: XI ROBOT ASSISTED SUTURE  RECTOPEXY;  Surgeon: Ileana Roup, MD;  Location: [facility];  Service: General;  Laterality: N/A;   XRT and surgical resection     s/p. 10 years ago    There were no vitals filed for this visit.   Subjective Assessment - 03/28/21 1018     Subjective Pt has questions about the intial HEP    Currently in Pain? No/denies  [place] Adult PT Treatment/Exercise - 03/28/21 0001       Self-Care   Other Self-Care Comments  toileting techniques      Exercises   Exercises Lumbar      Lumbar Exercises: Stretches   Other Lumbar Stretch Exercise pec stretch in doorway    Other Lumbar Stretch Exercise sitting with towel roll and pillow scap squeezes      Lumbar Exercises: Standing   Row Strengthening;Both;20 reps    Theraband Level (Row) Level 2 (Red)    Other Standing Lumbar Exercises shoulder circles 10x both ways    Other Standing Lumbar Exercises hip abduction green loop - 10x each side      Lumbar Exercises: Seated   Other Seated Lumbar Exercises sitting shoulder ER green loop                    PT Education - 03/28/21 1116     Education Details toileting techniques and updates to [employer]) Educated Patient     Methods Explanation;Demonstration;Tactile cues;Verbal cues;Handout    Comprehension Verbalized understanding;Returned demonstration                 PT Long Term Goals - 03/28/21 1018       PT LONG TERM GOAL #1   Title Pt will be ind with HEP to return to maximum function at work    Status On-going      PT LONG TERM GOAL #2   Title Pt will be able to stand up for 3-4 hours in order to complete at least half shift at work without interruptions from fecal leakage    Status On-going      PT LONG TERM GOAL #3   Title Pt will have at [place] 50% improved sensation of when he has to have a BM.    Status On-going                   Plan - 03/28/21 1014     Clinical Impression Statement Today's session focused on progressing core and posture strength as well as education on toileting techniques for more complete BMs.  Pt reported that he feels like he can tell her worked out today after his treatment.  Pt has not met goals yet as it was first treatment since the eval.  Pt will benefit from skilled PT to continue to address strength and posture.    PT Treatment/Interventions ADLs/Self Care Home Management;Biofeedback;Cryotherapy;Electrical Stimulation;Moist Heat;Therapeutic activities;Therapeutic exercise;Neuromuscular re-education;Patient/family education;Manual techniques;Taping;Dry needling;Passive range of motion    PT Next Visit Plan f/u on new HEP; supine ex's and TC for kegel and work on holding and quick flicks    PT Home Exercise Plan Access Code: [email or web address] and Agree with Plan of Care Patient             Patient will benefit from skilled therapeutic intervention in order to improve the following deficits and impairments:  Abnormal gait, Decreased strength, Postural dysfunction, Impaired flexibility, Increased fascial restricitons, Decreased coordination, Decreased range of motion, Increased muscle spasms, Decreased endurance  Visit Diagnosis: Muscle  weakness (generalized)  Abnormal posture     Problem List Patient Active Problem List   Diagnosis Date Noted   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   Macular pucker, right eye 11/30/2020   Cystoid macular edema, right eye 11/30/2020   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   Vitreomacular adhesion, left 11/30/2020   Lower extremity edema 12/04/2013  Pacemaker -Medtronic    S/P AVR (aortic valve replacement) 02/21/2013   Complete heart block (HCC) 02/19/2013   Chronic diastolic heart failure ([place]) 01/30/2013   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/26/2012   Groin hematoma 12/26/2012   Aortic stenosis    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   LEUKEMIA, 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   HYPERLIPIDEMIA 12/04/2008   DEPRESSION 12/04/2008   ASTHMA, CHILDHOOD 12/04/2008   ASTHMA 12/04/2008    Camillo Flaming Maikol Grassia, PT 03/28/2021, 11:18 AM  [facility] [address], [place], [place], [address] Phone: [phone]   Fax:  [phone]  Name: [name] MRN: [id number] Date of Birth: 1941/04/27

## 2021-03-30 ENCOUNTER — Other Ambulatory Visit: Payer: Self-pay

## 2021-03-30 ENCOUNTER — Ambulatory Visit (INDEPENDENT_AMBULATORY_CARE_PROVIDER_SITE_OTHER): Payer: Medicare Other

## 2021-03-30 ENCOUNTER — Encounter (INDEPENDENT_AMBULATORY_CARE_PROVIDER_SITE_OTHER): Payer: Self-pay

## 2021-03-30 DIAGNOSIS — H35371 Puckering of macula, right eye: Secondary | ICD-10-CM

## 2021-03-30 MED ORDER — PREDNISOLONE ACETATE 1 % OP SUSP: 1.0000 [drp] | Freq: Four times a day (QID) | OPHTHALMIC | 0 refills | Status: AC

## 2021-03-30 MED ORDER — OFLOXACIN 0.3 % OP SOLN: 1.0000 [drp] | Freq: Four times a day (QID) | OPHTHALMIC | 0 refills | Status: AC

## 2021-03-30 NOTE — Progress Notes (Signed)
03/30/2021     CHIEF COMPLAINT Patient presents for Pre-op Exam (Pt is here for pre-op PPV/membrane peel OD on 04/06/2021/Pt states VA OU stable since last visit. Pt denies FOL, floaters, or ocular pain OU. Maida Sale using Restasis OU/LBS: 109/)   HISTORY OF PRESENT ILLNESS: [name] is a 80 y.o. male who presents to the clinic today for:   HPI     Pre-op Exam           Comments: Pt is here for pre-op PPV/membrane peel OD on 04/06/2021 Pt states VA OU stable since last visit. Pt denies FOL, floaters, or ocular pain OU.  Still using Restasis OU LBS: 109        Last edited by Kendra Opitz, COA on 03/30/2021  2:14 PM.        HISTORICAL INFORMATION:   Selected notes from the MEDICAL RECORD NUMBER    Lab Results  Component Value Date   HGBA1C 5.9 (H) 02/09/2021     CURRENT MEDICATIONS: Current Outpatient Medications (Ophthalmic Drugs)  Medication Sig   ofloxacin (OCUFLOX) 0.3 % ophthalmic solution Place 1 drop into the right eye in the morning, at noon, in the evening, and at bedtime for 21 doses.   prednisoLONE acetate (PRED FORTE) 1 % ophthalmic suspension Place 1 drop into the right eye 4 (four) times daily for 21 doses.   cycloSPORINE (RESTASIS) 0.05 % ophthalmic emulsion Place 1 drop into both eyes 2 (two) times daily.   ketorolac (ACULAR) 0.5 % ophthalmic solution Place 1 drop into the right eye 4 (four) times daily.   Polyethyl Glycol-Propyl Glycol (SYSTANE OP) Place 1 drop into both eyes 2 (two) times daily.   No current facility-administered medications for this visit. (Ophthalmic Drugs)   Current Outpatient Medications (Other)  Medication Sig   acetaminophen (TYLENOL) 500 MG tablet Take 500 mg by mouth 3 (three) times daily.   aspirin 81 MG tablet Take 1 tablet (81 mg total) by mouth daily.   busPIRone (BUSPAR) 15 MG tablet Take 15-30 mg by mouth See admin instructions. Take 15 mg by mouth every morning and 30 mg every day at noon   Cholecalciferol  (VITAMIN D) 2000 units tablet Take 2,000 Units by mouth every other day.    fenofibrate 160 MG tablet Take 160 mg by mouth daily.   fluticasone (FLONASE) 50 MCG/ACT nasal spray Place 2 sprays into the nose daily.   FORA V12 BLOOD GLUCOSE TEST test strip    furosemide (LASIX) 40 MG tablet TAKE ONE TABLET TWICE DAILY   gabapentin (NEURONTIN) 300 MG capsule Take 300-600 mg by mouth See admin instructions. Take 300 mg by mouth each morning and 600 mg each afternoon.   LITETOUCH LANCETS MISC    montelukast (SINGULAIR) 10 MG tablet Take 10 mg by mouth daily.   Multiple Vitamin (MULTIVITAMIN WITH MINERALS) TABS tablet Take 1 tablet by mouth daily.   polyethylene glycol (MIRALAX / GLYCOLAX) 17 g packet Take 17 g by mouth daily.   potassium chloride SA (K-DUR,KLOR-CON) 20 MEQ tablet TAKE ONE TABLET EVERY DAY   Probiotic Product (PHILLIPS COLON HEALTH PO) Take 1 capsule by mouth daily.   sertraline (ZOLOFT) 100 MG tablet Take 100 mg by mouth daily.   traZODone (DESYREL) 50 MG tablet Take 150 mg by mouth at bedtime.   No current facility-administered medications for this visit. (Other)     ALLERGIES Allergies  Allergen Reactions   Banana Anaphylaxis   Penicillins Rash  Has patient had a PCN reaction causing immediate rash, facial/tongue/throat swelling, SOB or lightheadedness with hypotension: Unknown Has patient had a PCN reaction causing severe rash involving mucus membranes or skin necrosis: Unknown Has patient had a PCN reaction that required hospitalization: No Has patient had a PCN reaction occurring within the last 10 years: No If all of the above answers are "NO", then may proceed with Cephalosporin use.     PAST MEDICAL HISTORY Past Medical History:  Diagnosis Date   Adverse effect of anesthesia    Small airway   Aortic stenosis    s/p AVR 6/14   Arthritis    Asthma    Atrioventricular block, complete ([place])    a. s/p MDT dual chamber PPM 2014   Cataract 09/2020   bilateral    CHF (congestive heart failure) ([place])    Chronic lymphocytic leukemia ([place])    followed by Dr. Ouida Sills   Depression    Difficult intubation 02/09/2021   Anterior Airway.   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   Past Surgical History:  Procedure Laterality Date   AORTIC VALVE REPLACEMENT N/A 02/17/2013   Procedure: AORTIC VALVE REPLACEMENT (AVR);  Surgeon: Gaye Pollack, MD;  Location: [facility];  Service: Open Heart Surgery;  Laterality: N/A;   CYST REMOVAL NECK     EYE SURGERY     INNER EAR SURGERY     INTRAOPERATIVE TRANSESOPHAGEAL ECHOCARDIOGRAM N/A 02/17/2013   Procedure: INTRAOPERATIVE TRANSESOPHAGEAL ECHOCARDIOGRAM;  Surgeon: Gaye Pollack, MD;  Location[facility] OR;  Service: Open Heart Surgery;  Laterality: N/A;   LEFT AND RIGHT HEART CATHETERIZATION WITH CORONARY ANGIOGRAM N/A 12/25/2012   Procedure: LEFT AND RIGHT HEART CATHETERIZATION WITH CORONARY ANGIOGRAM;  Surgeon: Jolaine Artist, MD;  Location: [facility] CATH LAB;  Service: Cardiovascular;  Laterality: N/A;   PERMANENT PACEMAKER INSERTION N/A 02/19/2013   Procedure: PERMANENT PACEMAKER INSERTION;  Surgeon: Deboraha Sprang, MD;  Location: [facility] CATH LAB;  Service: Cardiovascular;  Laterality: N/A;   RECTAL BIOPSY N/A 10/08/2017   Procedure: EXCISION OF ANAL POLYP ;  Surgeon: Ileana Roup, MD;  [facility];  Service: General;  Laterality: N/A;   stapendectomy  07/30/02   right   XI ROBOT ASSISTED RECTOPEXY N/A 02/09/2021   Procedure: XI ROBOT ASSISTED SUTURE  RECTOPEXY;  Surgeon: Ileana Roup, MD;  Location: [facility];  Service: General;  Laterality: N/A;   XRT and surgical resection     s/p. 10 years ago    FAMILY HISTORY Family History  Problem Relation Age of Onset   Brain cancer Mother     Cancer Father        lung    SOCIAL HISTORY Social History   Tobacco Use   Smoking status: Never   Smokeless tobacco: Never  Vaping Use   Vaping Use: Never used  Substance Use Topics   Alcohol use: No   Drug use: No         OPHTHALMIC EXAM:  Base Eye Exam     Visual Acuity (ETDRS)       Right Left   Dist cc 20/40 -1 20/30   Dist ph cc NI 20/25 -1  Tonometry (Tonopen, 2:17 PM)       Right Left   Pressure 12          Neuro/Psych     Oriented x3: Yes   Mood/Affect: Normal         Dilation     No Dilation             Slit Lamp and Fundus Exam     External Exam       Right Left   External Normal Normal         Slit Lamp Exam       Right Left   Lids/Lashes Normal Normal   Conjunctiva/Sclera White and quiet White and quiet   Cornea Clear Clear   Anterior Chamber Deep and quiet Deep and quiet   Iris Round and reactive Round and reactive   Lens Centered posterior chamber intraocular lens Centered posterior chamber intraocular lens   Anterior Vitreous Asteroid hyalosis Normal         Fundus Exam       Right Left   Posterior Vitreous Asteroid hyalosis 2+    Disc Peripapillary atrophy Not dilated   C/D Ratio 0.4    Macula Epiretinal membrane, moderate topographic distortion    Vessels Normal    Periphery Normal             IMAGING AND PROCEDURES  Imaging and Procedures for '@TODAY'$ @           ASSESSMENT/PLAN:  No diagnosis found.  Ophthalmic Meds Ordered this visit:  Meds ordered this encounter  Medications   ofloxacin (OCUFLOX) 0.3 % ophthalmic solution    Sig: Place 1 drop into the right eye in the morning, at noon, in the evening, and at bedtime for 21 doses.    Dispense:  5 mL    Refill:  0   prednisoLONE acetate (PRED FORTE) 1 % ophthalmic suspension    Sig: Place 1 drop into the right eye 4 (four) times daily for 21 doses.    Dispense:  5 mL    Refill:  0        Pre-op completed.  Operative consent obtained with pre-op eye drops reviewed with Arvil Chaco and sent via [facility] as needed. Post op instructions reviewed with patient and per patient all questions answered.  [place], COA

## 2021-04-06 ENCOUNTER — Encounter (AMBULATORY_SURGERY_CENTER): Payer: Medicare Other | Admitting: Ophthalmology

## 2021-04-06 DIAGNOSIS — H35371 Puckering of macula, right eye: Secondary | ICD-10-CM

## 2021-04-07 ENCOUNTER — Other Ambulatory Visit: Payer: Self-pay

## 2021-04-07 ENCOUNTER — Ambulatory Visit (INDEPENDENT_AMBULATORY_CARE_PROVIDER_SITE_OTHER): Payer: Medicare Other | Admitting: Ophthalmology

## 2021-04-07 ENCOUNTER — Encounter (INDEPENDENT_AMBULATORY_CARE_PROVIDER_SITE_OTHER): Payer: Self-pay | Admitting: Ophthalmology

## 2021-04-07 DIAGNOSIS — H35371 Puckering of macula, right eye: Secondary | ICD-10-CM

## 2021-04-07 DIAGNOSIS — H4321 Crystalline deposits in vitreous body, right eye: Secondary | ICD-10-CM

## 2021-04-07 NOTE — Assessment & Plan Note (Signed)
Postop day #1 improved status post vitrectomy for ERM removal

## 2021-04-07 NOTE — Patient Instructions (Signed)
Ofloxacin  4 times daily to the operative eye  Prednisolone acetate 1 drop to the operative eye 4 times daily  Patient instructed not to refill the medications and use them for maximum of 3 weeks.  Patient instructed do not rub the eye.  Patient has the option to use the patch at night. 

## 2021-04-07 NOTE — Progress Notes (Signed)
04/07/2021     CHIEF COMPLAINT Patient presents for Post-op Follow-up   HISTORY OF PRESENT ILLNESS: [name] is a 80 y.o. male who presents to the clinic today for:   HPI     Post-op Follow-up           Laterality: right eye   Discomfort: tearing ("I feel like I can feel it running").  Negative for pain, foreign body sensation and floaters         Comments   I day post -op OD  VIT ILM PEEL, no gas, sx 04/06/2021 Pt states, "I feel like things went well. I am not having any real discomfort and I think I did ok during the night." PT reports that he used Ofloxacin and Pred QID OD 2 days prior to sx       Last edited by Hurman Horn, MD on 04/07/2021  9:01 AM.      Referring physician: Rutherford Guys, [place],  [place] [address]  HISTORICAL INFORMATION:   Selected notes from the MEDICAL RECORD NUMBER    Lab Results  Component Value Date   HGBA1C 5.9 (H) 02/09/2021     CURRENT MEDICATIONS: Current Outpatient Medications (Ophthalmic Drugs)  Medication Sig   cycloSPORINE (RESTASIS) 0.05 % ophthalmic emulsion Place 1 drop into both eyes 2 (two) times daily.   ketorolac (ACULAR) 0.5 % ophthalmic solution Place 1 drop into the right eye 4 (four) times daily.   Polyethyl Glycol-Propyl Glycol (SYSTANE OP) Place 1 drop into both eyes 2 (two) times daily.   No current facility-administered medications for this visit. (Ophthalmic Drugs)   Current Outpatient Medications (Other)  Medication Sig   acetaminophen (TYLENOL) 500 MG tablet Take 500 mg by mouth 3 (three) times daily.   aspirin 81 MG tablet Take 1 tablet (81 mg total) by mouth daily.   busPIRone (BUSPAR) 15 MG tablet Take 15-30 mg by mouth See admin instructions. Take 15 mg by mouth every morning and 30 mg every day at noon   Cholecalciferol (VITAMIN D) 2000 units tablet Take 2,000 Units by mouth every other day.    fenofibrate 160 MG tablet Take 160 mg by mouth daily.   fluticasone  (FLONASE) 50 MCG/ACT nasal spray Place 2 sprays into the nose daily.   FORA V12 BLOOD GLUCOSE TEST test strip    furosemide (LASIX) 40 MG tablet TAKE ONE TABLET TWICE DAILY   gabapentin (NEURONTIN) 300 MG capsule Take 300-600 mg by mouth See admin instructions. Take 300 mg by mouth each morning and 600 mg each afternoon.   LITETOUCH LANCETS MISC    montelukast (SINGULAIR) 10 MG tablet Take 10 mg by mouth daily.   Multiple Vitamin (MULTIVITAMIN WITH MINERALS) TABS tablet Take 1 tablet by mouth daily.   polyethylene glycol (MIRALAX / GLYCOLAX) 17 g packet Take 17 g by mouth daily.   potassium chloride SA (K-DUR,KLOR-CON) 20 MEQ tablet TAKE ONE TABLET EVERY DAY   Probiotic Product (PHILLIPS COLON HEALTH PO) Take 1 capsule by mouth daily.   sertraline (ZOLOFT) 100 MG tablet Take 100 mg by mouth daily.   traZODone (DESYREL) 50 MG tablet Take 150 mg by mouth at bedtime.   No current facility-administered medications for this visit. (Other)      REVIEW OF SYSTEMS:    ALLERGIES Allergies  Allergen Reactions   Banana Anaphylaxis   Penicillins Rash    Has patient had a PCN reaction causing immediate rash, facial/tongue/throat swelling, SOB  or lightheadedness with hypotension: Unknown Has patient had a PCN reaction causing severe rash involving mucus membranes or skin necrosis: Unknown Has patient had a PCN reaction that required hospitalization: No Has patient had a PCN reaction occurring within the last 10 years: No If all of the above answers are "NO", then may proceed with Cephalosporin use.     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 Ouida Sills    Depression    Difficult intubation 02/09/2021   Anterior Airway.   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   Past Surgical History:  Procedure Laterality Date   AORTIC VALVE REPLACEMENT N/A 02/17/2013   Procedure: AORTIC VALVE REPLACEMENT (AVR);  Surgeon: Gaye Pollack, MD;  Location: [place];  Service: Open Heart Surgery;  Laterality: N/A;   CYST REMOVAL NECK     EYE SURGERY     INNER EAR SURGERY     INTRAOPERATIVE TRANSESOPHAGEAL ECHOCARDIOGRAM N/A 02/17/2013   Procedure: INTRAOPERATIVE TRANSESOPHAGEAL ECHOCARDIOGRAM;  Surgeon: Gaye Pollack, MD;  Location: [facility] OR;  Service: Open Heart Surgery;  Laterality: N/A;   LEFT AND RIGHT HEART CATHETERIZATION WITH CORONARY ANGIOGRAM N/A 12/25/2012   Procedure: LEFT AND RIGHT HEART CATHETERIZATION WITH CORONARY ANGIOGRAM;  Surgeon: Jolaine Artist, MD;  Location: [facility] CATH LAB;  Service: Cardiovascular;  Laterality: N/A;   PERMANENT PACEMAKER INSERTION N/A 02/19/2013   Procedure: PERMANENT PACEMAKER INSERTION;  Surgeon: Deboraha Sprang, MD;  Location: [facility] CATH LAB;  Service: Cardiovascular;  Laterality: N/A;   RECTAL BIOPSY N/A 10/08/2017   Procedure: EXCISION OF ANAL POLYP ;  Surgeon: Ileana Roup, MD;  Location: [facility] ORS;  Service: General;  Laterality: N/A;   stapendectomy  07/30/02   right   XI ROBOT ASSISTED RECTOPEXY N/A 02/09/2021   Procedure: XI ROBOT ASSISTED SUTURE  RECTOPEXY;  Surgeon: Ileana Roup, MD;  Location: [facility] ORS;  Service: General;  Laterality: N/A;   XRT and surgical resection     s/p. 10 years ago    FAMILY HISTORY Family History  Problem Relation Age of Onset   Brain cancer Mother    Cancer Father        lung    SOCIAL HISTORY Social History   Tobacco Use   Smoking status: Never    Smokeless tobacco: Never  Vaping Use   Vaping Use: Never used  Substance Use Topics   Alcohol use: No   Drug use: No         OPHTHALMIC EXAM:  Base Eye Exam     Visual Acuity (ETDRS)       Right Left   Dist cc 20/100 20/25   Dist ph cc NI     Correction: Glasses         Tonometry (Tonopen, 8:47 AM)  Right Left   Pressure 10 10         Pupils       Dark Light Shape React APD   Right 7 7 Round  Dilated    Left 6 5 Round Brisk None         Neuro/Psych     Oriented x3: Yes   Mood/Affect: Normal         Dilation     Right eye: 1.0% Mydriacyl, 2.5% Phenylephrine @ 8:47 AM           Slit Lamp and Fundus Exam     External Exam       Right Left   External Normal Normal         Slit Lamp Exam       Right Left   Lids/Lashes Normal Normal   Conjunctiva/Sclera White and quiet White and quiet   Cornea Clear Clear   Anterior Chamber Deep and quiet Deep and quiet   Iris Round and reactive Round and reactive   Lens Centered posterior chamber intraocular lens Centered posterior chamber intraocular lens   Anterior Vitreous Clear avitric Normal         Fundus Exam       Right Left   Posterior Vitreous Clear avitric    Disc Peripapillary atrophy Not dilated   C/D Ratio 0.4    Macula No topographic distortion remains post ILM removal changes present    Vessels Normal    Periphery Normal             IMAGING AND PROCEDURES  Imaging and Procedures for 04/07/21           ASSESSMENT/PLAN:  Asteroid hyalosis of right eye Postop day #1 improved status post vitrectomy for ERM removal  Macular pucker, right eye Postop day #1 much less topographic distortion to the macula, clear media now present post vitrectomy as coincidental asteroid hyalosis was removed     ICD-10-CM   1. 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 Macular pucker, right eye  H35.371       1.  Postop day #1 status post vitrectomy membrane peel, ILM  peel, no gas injection.  Vitreous is now clear as dense asteroid was coincidentally and effectively removed.  No ILM distortion remains.  2.  Explained to the patient the importance of of restarting topical eye medications but most importantly no compression rubbing or mashing rubbing the eye, forever.  This was demonstrated.  3.  Ophthalmic Meds Ordered this visit:  No orders of the defined types were placed in this encounter.      Return in about 1 week (around 04/14/2021) for dilate, OD, POST OP, OCT.  Patient Instructions  Ofloxacin  4 times daily to the operative eye  Prednisolone acetate 1 drop to the operative eye 4 times daily  Patient instructed not to refill the medications and use them for maximum of 3 weeks.  Patient instructed do not rub the eye.  Patient has the option to use the patch at night.      Explained the diagnoses, plan, and follow up with the patient and they expressed understanding.  Patient expressed understanding of the importance of proper follow up care.   Clent Demark Tysen Roesler M.D. Diseases & Surgery of the Retina and Vitreous Retina & Diabetic [place] 04/07/21     Abbreviations: M myopia (nearsighted); A astigmatism; H hyperopia (farsighted); P presbyopia; Mrx spectacle prescription;  CTL contact lenses;  OD right eye; OS left eye; OU both eyes  XT exotropia; ET esotropia; PEK punctate epithelial keratitis; PEE punctate epithelial erosions; DES dry eye syndrome; MGD meibomian gland dysfunction; ATs artificial tears; PFAT's preservative free artificial tears; [place] nuclear sclerotic cataract; PSC posterior subcapsular cataract; ERM epi-retinal membrane; PVD posterior vitreous detachment; RD retinal detachment; DM diabetes mellitus; DR diabetic retinopathy; NPDR non-proliferative diabetic retinopathy; PDR proliferative diabetic retinopathy; CSME clinically significant macular edema; DME diabetic macular edema; dbh dot blot hemorrhages; CWS cotton wool  spot; POAG primary open angle glaucoma; C/D cup-to-disc ratio; HVF humphrey visual field; GVF goldmann visual field; OCT optical coherence tomography; IOP intraocular pressure; BRVO Branch retinal vein occlusion; CRVO central retinal vein occlusion; CRAO central retinal artery occlusion; BRAO branch retinal artery occlusion; RT retinal tear; SB scleral buckle; PPV pars plana vitrectomy; VH Vitreous hemorrhage; PRP panretinal laser photocoagulation; IVK intravitreal kenalog; VMT vitreomacular traction; MH Macular hole;  NVD neovascularization of the disc; NVE neovascularization elsewhere; AREDS age related eye disease study; ARMD age related macular degeneration; POAG primary open angle glaucoma; EBMD epithelial/anterior basement membrane dystrophy; ACIOL anterior chamber intraocular lens; IOL intraocular lens; PCIOL posterior chamber intraocular lens; Phaco/IOL phacoemulsification with intraocular lens placement; [place] photorefractive keratectomy; LASIK laser assisted in situ keratomileusis; HTN hypertension; DM diabetes mellitus; COPD chronic obstructive pulmonary disease

## 2021-04-07 NOTE — Assessment & Plan Note (Signed)
Postop day #1 much less topographic distortion to the macula, clear media now present post vitrectomy as coincidental asteroid hyalosis was removed

## 2021-04-11 NOTE — Progress Notes (Signed)
Remote pacemaker transmission.   

## 2021-04-13 ENCOUNTER — Other Ambulatory Visit: Payer: Self-pay

## 2021-04-13 ENCOUNTER — Ambulatory Visit (INDEPENDENT_AMBULATORY_CARE_PROVIDER_SITE_OTHER): Payer: Medicare Other | Admitting: Ophthalmology

## 2021-04-13 ENCOUNTER — Encounter (INDEPENDENT_AMBULATORY_CARE_PROVIDER_SITE_OTHER): Payer: Self-pay | Admitting: Ophthalmology

## 2021-04-13 DIAGNOSIS — H35371 Puckering of macula, right eye: Secondary | ICD-10-CM | POA: Diagnosis not present

## 2021-04-13 DIAGNOSIS — H4321 Crystalline deposits in vitreous body, right eye: Secondary | ICD-10-CM

## 2021-04-13 DIAGNOSIS — H35351 Cystoid macular degeneration, right eye: Secondary | ICD-10-CM | POA: Diagnosis not present

## 2021-04-13 NOTE — Assessment & Plan Note (Signed)
Improved OD

## 2021-04-13 NOTE — Progress Notes (Signed)
04/13/2021     CHIEF COMPLAINT Patient presents for Post-op Follow-up   HISTORY OF PRESENT ILLNESS: [name] is a 80 y.o. male who presents to the clinic today for:   HPI     Post-op Follow-up           Laterality: right eye   Discomfort: Negative for pain, foreign body sensation, tearing ("I feel like I can feel it running"), discharge and floaters   Vision: is stable         Comments   1 wk po od oct sx 04/06/21. Patient states vision is stable and unchanged since last visit. Denies any new floaters or FOL.  Pt is using ketorolac qid od, prednisolone qid od. Pt is tolerating eye drops well, states "one day I experienced a burning sensation but that was right after surgery."       Last edited by Laurin Coder, COA on 04/13/2021  8:29 AM.      Referring physician: Jonathon Jordan, MD [place] [address],  [place] [address]  HISTORICAL INFORMATION:   Selected notes from the MEDICAL RECORD NUMBER    Lab Results  Component Value Date   HGBA1C 5.9 (H) 02/09/2021     CURRENT MEDICATIONS: Current Outpatient Medications (Ophthalmic Drugs)  Medication Sig   cycloSPORINE (RESTASIS) 0.05 % ophthalmic emulsion Place 1 drop into both eyes 2 (two) times daily.   ketorolac (ACULAR) 0.5 % ophthalmic solution Place 1 drop into the right eye 4 (four) times daily.   Polyethyl Glycol-Propyl Glycol (SYSTANE OP) Place 1 drop into both eyes 2 (two) times daily.   No current facility-administered medications for this visit. (Ophthalmic Drugs)   Current Outpatient Medications (Other)  Medication Sig   acetaminophen (TYLENOL) 500 MG tablet Take 500 mg by mouth 3 (three) times daily.   aspirin 81 MG tablet Take 1 tablet (81 mg total) by mouth daily.   busPIRone (BUSPAR) 15 MG tablet Take 15-30 mg by mouth See admin instructions. Take 15 mg by mouth every morning and 30 mg every day at noon   Cholecalciferol (VITAMIN D) 2000 units tablet Take 2,000  Units by mouth every other day.    fenofibrate 160 MG tablet Take 160 mg by mouth daily.   fluticasone (FLONASE) 50 MCG/ACT nasal spray Place 2 sprays into the nose daily.   FORA V12 BLOOD GLUCOSE TEST test strip    furosemide (LASIX) 40 MG tablet TAKE ONE TABLET TWICE DAILY   gabapentin (NEURONTIN) 300 MG capsule Take 300-600 mg by mouth See admin instructions. Take 300 mg by mouth each morning and 600 mg each afternoon.   LITETOUCH LANCETS MISC    montelukast (SINGULAIR) 10 MG tablet Take 10 mg by mouth daily.   Multiple Vitamin (MULTIVITAMIN WITH MINERALS) TABS tablet Take 1 tablet by mouth daily.   polyethylene glycol (MIRALAX / GLYCOLAX) 17 g packet Take 17 g by mouth daily.   potassium chloride SA (K-DUR,KLOR-CON) 20 MEQ tablet TAKE ONE TABLET EVERY DAY   Probiotic Product (PHILLIPS COLON HEALTH PO) Take 1 capsule by mouth daily.   sertraline (ZOLOFT) 100 MG tablet Take 100 mg by mouth daily.   traZODone (DESYREL) 50 MG tablet Take 150 mg by mouth at bedtime.   No current facility-administered medications for this visit. (Other)      REVIEW OF SYSTEMS:    ALLERGIES Allergies  Allergen Reactions   Banana Anaphylaxis   Penicillins Rash    Has patient had a  PCN reaction causing immediate rash, facial/tongue/throat swelling, SOB or lightheadedness with hypotension: Unknown Has patient had a PCN reaction causing severe rash involving mucus membranes or skin necrosis: Unknown Has patient had a PCN reaction that required hospitalization: No Has patient had a PCN reaction occurring within the last 10 years: No If all of the above answers are "NO", then may proceed with Cephalosporin use.     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 Ouida Sills   Depression    Difficult intubation 02/09/2021   Anterior Airway.   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   Past Surgical History:  Procedure Laterality Date   AORTIC VALVE REPLACEMENT N/A 02/17/2013   Procedure: AORTIC VALVE REPLACEMENT (AVR);  Surgeon: Gaye Pollack, MD;  Location: [place];  Service: Open Heart Surgery;  Laterality: N/A;   CYST REMOVAL NECK     EYE SURGERY     INNER EAR SURGERY     INTRAOPERATIVE TRANSESOPHAGEAL ECHOCARDIOGRAM N/A 02/17/2013   Procedure: INTRAOPERATIVE TRANSESOPHAGEAL ECHOCARDIOGRAM;  Surgeon: Gaye Pollack, MD;  Location: [facility] OR;  Service: Open Heart Surgery;  Laterality: N/A;   LEFT AND RIGHT HEART CATHETERIZATION WITH CORONARY ANGIOGRAM N/A 12/25/2012   Procedure: LEFT AND RIGHT HEART CATHETERIZATION WITH CORONARY ANGIOGRAM;  Surgeon: Jolaine Artist, MD;  Location: [facility] CATH LAB;  Service: Cardiovascular;  Laterality: N/A;   PERMANENT PACEMAKER INSERTION N/A 02/19/2013   Procedure: PERMANENT PACEMAKER INSERTION;  Surgeon: Deboraha Sprang, MD;  Location[facility] CATH LAB;  Service: Cardiovascular;  Laterality: N/A;   RECTAL BIOPSY N/A 10/08/2017   Procedure: EXCISION OF ANAL POLYP ;  Surgeon: Ileana Roup, MD;  [facility];  Service: General;  Laterality: N/A;   stapendectomy  07/30/02   right   XI ROBOT ASSISTED RECTOPEXY N/A 02/09/2021   Procedure: XI ROBOT ASSISTED SUTURE  RECTOPEXY;  Surgeon: Ileana Roup, MD;  [facility];  Service: General;  Laterality: N/A;   XRT and surgical resection     s/p. 10 years ago    FAMILY HISTORY Family History  Problem Relation Age of Onset   Brain cancer Mother     Cancer Father        lung    SOCIAL HISTORY Social History   Tobacco Use   Smoking status: Never   Smokeless tobacco: Never  Vaping Use   Vaping Use: Never used  Substance Use Topics   Alcohol use: No   Drug use: No         OPHTHALMIC EXAM:  Base Eye Exam     Visual Acuity (ETDRS)       Right Left   Dist cc 20/40 -2 20/20 -   Dist ph cc NI  Tonometry (Tonopen, 8:30 AM)       Right Left   Pressure 8 6         Pupils       Dark Light Shape React APD   Right 4 3.5 Round Minimal None   Left 4 3.5 Round Minimal None         Visual Fields (Counting fingers)       Left Right    Full Full         Extraocular Movement       Right Left    Full Full         Neuro/Psych     Oriented x3: Yes   Mood/Affect: Normal         Dilation     Right eye: 1.0% Mydriacyl, 2.5% Phenylephrine @ 8:35 AM           Slit Lamp and Fundus Exam     External Exam       Right Left   External Normal Normal         Slit Lamp Exam       Right Left   Lids/Lashes Normal Normal   Conjunctiva/Sclera White and quiet White and quiet   Cornea Clear Clear   Anterior Chamber Deep and quiet Deep and quiet   Iris Round and reactive Round and reactive   Lens Centered posterior chamber intraocular lens Centered posterior chamber intraocular lens   Anterior Vitreous Clear avitric Normal         Fundus Exam       Right Left   Posterior Vitreous Clear avitric    Disc Peripapillary atrophy Not dilated   C/D Ratio 0.4    Macula No topographic distortion remains post ILM removal changes present    Vessels Normal    Periphery Normal             IMAGING AND PROCEDURES  Imaging and Procedures for 04/13/21  OCT, Retina - OU - Both Eyes       Right Eye Quality was good. Scan locations included subfoveal. Central Foveal Thickness: 406. Progression has no prior data. Findings include abnormal foveal contour, cystoid macular edema, epiretinal  membrane, vitreomacular adhesion .   Left Eye Quality was good. Scan locations included subfoveal. Central Foveal Thickness: 296. Progression has no prior data. Findings include vitreomacular adhesion , normal foveal contour.   Notes CME with overlying epiretinal membrane OD, with elevation of the foveal depression as if the epiretinal membrane is a component of visual difficulty and or en face vitreomacular adhesion  OS with incidental VMA, no topographic distortions             ASSESSMENT/PLAN:  Cystoid macular edema, right eye Improved OD     ICD-10-CM   1. Asteroid hyalosis of right eye  H43.21 OCT, Retina - OU - Both Eyes    2. Cystoid macular edema, right eye  H35.351 OCT, Retina - OU - Both Eyes    3. Macular pucker, right eye  H35.371       1.  OD with stable ligament and improving acuity 1 week post vitrectomy for epiretinal membrane, macular thickening with also removal of dense asteroid hyalosis vitreous debris  2.  3.  Ophthalmic Meds Ordered this visit:  No orders of the defined types were placed in this encounter.      Return in about 8 weeks (around 06/08/2021) for dilate, OD, POST OP, OCT.  Patient Instructions  Ofloxacin  4  times daily to the operative eye  Prednisolone acetate 1 drop to the operative eye 4 times daily  Patient instructed not to refill the medications and use them for maximum of 3 weeks.  Patient instructed do not rub the eye.  Patient has the option to use the patch at night.    Explained the diagnoses, plan, and follow up with the patient and they expressed understanding.  Patient expressed understanding of the importance of proper follow up care.   Clent Demark Jamol Ginyard M.D. Diseases & Surgery of the Retina and Vitreous Retina & Diabetic [place] 04/13/21     Abbreviations: M myopia (nearsighted); A astigmatism; H hyperopia (farsighted); P presbyopia; Mrx spectacle prescription;  CTL contact lenses; OD right eye; OS  left eye; OU both eyes  XT exotropia; ET esotropia; PEK punctate epithelial keratitis; PEE punctate epithelial erosions; DES dry eye syndrome; MGD meibomian gland dysfunction; ATs artificial tears; PFAT's preservative free artificial tears; [place] nuclear sclerotic cataract; PSC posterior subcapsular cataract; ERM epi-retinal membrane; PVD posterior vitreous detachment; RD retinal detachment; DM diabetes mellitus; DR diabetic retinopathy; NPDR non-proliferative diabetic retinopathy; PDR proliferative diabetic retinopathy; CSME clinically significant macular edema; DME diabetic macular edema; dbh dot blot hemorrhages; CWS cotton wool spot; POAG primary open angle glaucoma; C/D cup-to-disc ratio; HVF humphrey visual field; GVF goldmann visual field; OCT optical coherence tomography; IOP intraocular pressure; BRVO Branch retinal vein occlusion; CRVO central retinal vein occlusion; CRAO central retinal artery occlusion; BRAO branch retinal artery occlusion; RT retinal tear; SB scleral buckle; PPV pars plana vitrectomy; VH Vitreous hemorrhage; PRP panretinal laser photocoagulation; IVK intravitreal kenalog; VMT vitreomacular traction; MH Macular hole;  NVD neovascularization of the disc; NVE neovascularization elsewhere; AREDS age related eye disease study; ARMD age related macular degeneration; POAG primary open angle glaucoma; EBMD epithelial/anterior basement membrane dystrophy; ACIOL anterior chamber intraocular lens; IOL intraocular lens; PCIOL posterior chamber intraocular lens; Phaco/IOL phacoemulsification with intraocular lens placement; [place] photorefractive keratectomy; LASIK laser assisted in situ keratomileusis; HTN hypertension; DM diabetes mellitus; COPD chronic obstructive pulmonary disease

## 2021-04-13 NOTE — Patient Instructions (Signed)
Ofloxacin  4 times daily to the operative eye  Prednisolone acetate 1 drop to the operative eye 4 times daily  Patient instructed not to refill the medications and use them for maximum of 3 weeks.  Patient instructed do not rub the eye.  Patient has the option to use the patch at night. 

## 2021-04-14 ENCOUNTER — Encounter: Payer: Medicare Other | Admitting: Physical Therapy

## 2021-04-21 ENCOUNTER — Encounter: Payer: Self-pay | Admitting: Physical Therapy

## 2021-04-21 ENCOUNTER — Ambulatory Visit: Payer: Medicare Other | Admitting: Physical Therapy

## 2021-04-21 ENCOUNTER — Other Ambulatory Visit: Payer: Self-pay

## 2021-04-21 DIAGNOSIS — R293 Abnormal posture: Secondary | ICD-10-CM

## 2021-04-21 DIAGNOSIS — M6281 Muscle weakness (generalized): Secondary | ICD-10-CM

## 2021-04-21 DIAGNOSIS — E538 Deficiency of other specified B group vitamins: Secondary | ICD-10-CM | POA: Diagnosis not present

## 2021-04-21 NOTE — Therapy (Signed)
[facility] [address], [place], [place], [address] Phone: [phone]   Fax:  [phone]  Physical Therapy Treatment  Patient Details  Name: [name] MRN: [id number] Date of Birth: 09/13/1940 Referring Provider (PT): Ileana Roup, MD   Encounter Date: 04/21/2021   PT End of Session - 04/21/21 0927     Visit Number 3    Date for PT Re-Evaluation 06/17/21    Authorization Type medicare    PT Start Time 0927    PT Stop Time 1010    PT Time Calculation (min) 43 min    Activity Tolerance Patient tolerated treatment well    Behavior During Therapy [facility] for tasks assessed/performed             Past Medical History:  Diagnosis Date   Adverse effect of anesthesia    Small airway   Aortic stenosis    s/p AVR 6/14   Arthritis    Asthma    Atrioventricular block, complete ([place])    a. s/p MDT dual chamber PPM 2014   Cataract 09/2020   bilateral   CHF (congestive heart failure) ([place])    Chronic lymphocytic leukemia ([place])    followed by Dr. Ouida Sills   Depression    Difficult intubation 02/09/2021   Anterior Airway.   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    Past Surgical History:  Procedure Laterality Date   AORTIC VALVE REPLACEMENT N/A 02/17/2013   Procedure: AORTIC VALVE REPLACEMENT (AVR);  Surgeon: Gaye Pollack, MD;  Location: [place];  Service: Open Heart Surgery;  Laterality: N/A;   CYST REMOVAL NECK     EYE SURGERY     INNER EAR SURGERY     INTRAOPERATIVE TRANSESOPHAGEAL ECHOCARDIOGRAM N/A 02/17/2013   Procedure: INTRAOPERATIVE TRANSESOPHAGEAL ECHOCARDIOGRAM;  Surgeon: Gaye Pollack, MD;  [facility];  Service: Open Heart Surgery;  Laterality: N/A;    LEFT AND RIGHT HEART CATHETERIZATION WITH CORONARY ANGIOGRAM N/A 12/25/2012   Procedure: LEFT AND RIGHT HEART CATHETERIZATION WITH CORONARY ANGIOGRAM;  Surgeon: Jolaine Artist, MD;  Location: [facility] CATH LAB;  Service: Cardiovascular;  Laterality: N/A;   PERMANENT PACEMAKER INSERTION N/A 02/19/2013   Procedure: PERMANENT PACEMAKER INSERTION;  Surgeon: Deboraha Sprang, MD;  Location: [facility] CATH LAB;  Service: Cardiovascular;  Laterality: N/A;   RECTAL BIOPSY N/A 10/08/2017   Procedure: EXCISION OF ANAL POLYP ;  Surgeon: Ileana Roup, MD;  [facility];  Service: General;  Laterality: N/A;   stapendectomy  07/30/02   right   XI ROBOT ASSISTED RECTOPEXY N/A 02/09/2021   Procedure: XI ROBOT ASSISTED SUTURE  RECTOPEXY;  Surgeon: Ileana Roup, MD;  Location: [facility];  Service: General;  Laterality: N/A;   XRT and surgical resection     s/p. 10 years ago    There were no vitals filed for this visit.   Subjective Assessment - 04/21/21 0933     Subjective I didn't do anything for two weeks and now released to do anything again. I am having leakage at work.  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 No/denies                               [facility] Adult PT Treatment/Exercise - 04/21/21 0001       Neuro Re-ed    Neuro Re-ed Details  biofeedback with exercises for training control      Lumbar Exercises: Standing   Other Standing Lumbar Exercises kegel standing with exhale      Lumbar Exercises: Supine   AB Set Limitations kegel with slow contract to max then rest - [place] on biofeedback    Bridge 20 reps   4 second contract; 4 rest                        PT Long Term Goals - 04/21/21 1103       PT LONG TERM GOAL #1   Title Pt will be ind with HEP to return to maximum function at work    Status On-going      PT LONG TERM GOAL  #2   Title Pt will be able to stand up for 3-4 hours in order to complete at least half shift at work without interruptions from fecal leakage    Status On-going      PT LONG TERM GOAL #3   Title Pt will have at [place] 50% improved sensation of when he has to have a BM.    Status On-going                   Plan - 04/21/21 1100     Clinical Impression Statement Pt is still experiencing fecal smearing constantly as well as larger leakage when working.  Pt is now able to resume exercises and biofeedback  was helpful to work on gradual contracting and relaxation.  Resting tone was <78m and able to contract to 261m   PT Treatment/Interventions ADLs/Self Care Home Management;Biofeedback;Cryotherapy;Electrical Stimulation;Moist Heat;Therapeutic activities;Therapeutic exercise;Neuromuscular re-education;Patient/family education;Manual techniques;Taping;Dry needling;Passive range of motion    PT Next Visit Plan biofeedback again if he felt it helped, contract and hold steps program    PT Home Exercise Plan Access Code: [email or web address]    Consulted and Agree with Plan of Care Patient             Patient will benefit from skilled therapeutic intervention in order to improve the following deficits and impairments:  Abnormal gait, Decreased strength, Postural dysfunction, Impaired flexibility, Increased fascial restricitons, Decreased coordination, Decreased range of motion, Increased muscle spasms, Decreased endurance  Visit Diagnosis: Muscle weakness (generalized)  Abnormal posture     Problem List Patient Active Problem List   Diagnosis Date Noted   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   Macular pucker, right eye 11/30/2020   Cystoid macular edema, right eye 11/30/2020   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   Vitreomacular adhesion, left 11/30/2020   Lower extremity edema 12/04/2013   Pacemaker -Medtronic    S/P AVR (aortic valve replacement) 02/21/2013   [place] heart block ([place] 02/19/2013   Chronic diastolic heart failure ([place]06/12/2012   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/26/2012   Groin hematoma 12/26/2012   Aortic stenosis    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   LEUKEMIA, 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   HYPERLIPIDEMIA 12/04/2008   DEPRESSION 12/04/2008   ASTHMA, CHILDHOOD 12/04/2008   ASTHMA 12/04/2008    JaJaney Genta  L Alphonsa Brickle, PT 04/21/2021, 11:03 AM  [facility] [address] [place], [place], [address] Phone: [phone]   Fax:  [phone]  Name: [name] MRN: [id number] Date of Birth: 04-Nov-1940

## 2021-04-28 ENCOUNTER — Other Ambulatory Visit: Payer: Self-pay

## 2021-04-28 ENCOUNTER — Ambulatory Visit: Payer: Medicare Other | Attending: Surgery | Admitting: Physical Therapy

## 2021-04-28 ENCOUNTER — Encounter: Payer: Self-pay | Admitting: Physical Therapy

## 2021-04-28 DIAGNOSIS — M6281 Muscle weakness (generalized): Secondary | ICD-10-CM

## 2021-04-28 DIAGNOSIS — R293 Abnormal posture: Secondary | ICD-10-CM | POA: Insufficient documentation

## 2021-04-28 NOTE — Therapy (Signed)
[facility] [address] [place], [place], [address] Phone: [phone]   Fax:  [phone]  Physical Therapy Treatment  Patient Details  Name: [name] MRN: [id number] Date of Birth: Oct 25, 1940 Referring Provider (PT): Ileana Roup, MD   Encounter Date: 04/28/2021   PT End of Session - 04/28/21 1151     Visit Number 4    Date for PT Re-Evaluation 06/17/21    Authorization Type medicare    PT Start Time 1148    PT Stop Time 1228    PT Time Calculation (min) 40 min    Activity Tolerance Patient tolerated treatment well    Behavior During Therapy [facility] for tasks assessed/performed             Past Medical History:  Diagnosis Date   Adverse effect of anesthesia    Small airway   Aortic stenosis    s/p AVR 6/14   Arthritis    Asthma    Atrioventricular block, complete ([place])    a. s/p MDT dual chamber PPM 2014   Cataract 09/2020   bilateral   CHF (congestive heart failure) ([place])    Chronic lymphocytic leukemia ([place])    followed by Dr. Ouida Sills   Depression    Difficult intubation 02/09/2021   Anterior Airway.   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    Past Surgical History:  Procedure Laterality Date   AORTIC VALVE REPLACEMENT N/A 02/17/2013   Procedure: AORTIC VALVE REPLACEMENT (AVR);  Surgeon: Gaye Pollack, MD;  Location: [place];  Service: Open Heart Surgery;  Laterality: N/A;   CYST REMOVAL NECK     EYE SURGERY     INNER EAR SURGERY     INTRAOPERATIVE TRANSESOPHAGEAL ECHOCARDIOGRAM N/A 02/17/2013   Procedure: INTRAOPERATIVE TRANSESOPHAGEAL ECHOCARDIOGRAM;  Surgeon: Gaye Pollack, MD;  Location: [facility] OR;  Service: Open Heart Surgery;  Laterality: N/A;   LEFT  AND RIGHT HEART CATHETERIZATION WITH CORONARY ANGIOGRAM N/A 12/25/2012   Procedure: LEFT AND RIGHT HEART CATHETERIZATION WITH CORONARY ANGIOGRAM;  Surgeon: Jolaine Artist, MD;  Location: [facility] CATH LAB;  Service: Cardiovascular;  Laterality: N/A;   PERMANENT PACEMAKER INSERTION N/A 02/19/2013   Procedure: PERMANENT PACEMAKER INSERTION;  Surgeon: Deboraha Sprang, MD;  Location: [facility] CATH LAB;  Service: Cardiovascular;  Laterality: N/A;   RECTAL BIOPSY N/A 10/08/2017   Procedure: EXCISION OF ANAL POLYP ;  Surgeon: Ileana Roup, MD;  [facility];  Service: General;  Laterality: N/A;   stapendectomy  07/30/02   right   XI ROBOT ASSISTED RECTOPEXY N/A 02/09/2021   Procedure: XI ROBOT ASSISTED SUTURE  RECTOPEXY;  Surgeon: Ileana Roup, MD;  [facility];  Service: General;  Laterality: N/A;   XRT and surgical resection     s/p. 10 years ago    There were no vitals filed for this visit.   Subjective Assessment - 04/28/21 1224     Subjective I think there is less of a mess, but still having the leakage    Patient Stated Goals reduce or eliminate leakage of stool    Currently in Pain? No/denies  [place] Adult PT Treatment/Exercise - 04/28/21 0001       Lumbar Exercises: Machines for Strengthening   Leg Press 40lb bil - kregel with slow lowering      Lumbar Exercises: Standing   Row Strengthening;Both;20 reps    Theraband Level (Row) Level 2 (Red)    Shoulder Extension Limitations stepping forward with bracing    Other Standing Lumbar Exercises hip hinge  standing near wall;                         PT Long Term Goals - 04/28/21 1257       PT LONG TERM GOAL #1   Title Pt will be ind with HEP to return to maximum function at work    Status On-going      PT LONG TERM GOAL #2   Title Pt will be able to stand up for 3-4 hours in order to complete at least half shift at work without interruptions from fecal  leakage    Status On-going      PT LONG TERM GOAL #3   Title Pt will have at [place] 50% improved sensation of when he has to have a BM.    Status On-going                   Plan - 04/28/21 1249     Clinical Impression Statement Pt states there is a little less mess, but the frequency of leakage is the same.  Pt was educated on incorporating functional movement patterns into the exercises.  Pt did well with cues to keep spine straight and shoulders together looking straight ahead.  Pt is making slow and steady progress and will benefit from skilled PT to continue to address strength and posture for reduced leakage.    PT Treatment/Interventions ADLs/Self Care Home Management;Biofeedback;Cryotherapy;Electrical Stimulation;Moist Heat;Therapeutic activities;Therapeutic exercise;Neuromuscular re-education;Patient/family education;Manual techniques;Taping;Dry needling;Passive range of motion    PT Next Visit Plan f/u on hip hinge and lifting    PT Home Exercise Plan Access Code: [place]    Consulted and Agree with Plan of Care Patient             Patient will benefit from skilled therapeutic intervention in order to improve the following deficits and impairments:  Abnormal gait, Decreased strength, Postural dysfunction, Impaired flexibility, Increased fascial restricitons, Decreased coordination, Decreased range of motion, Increased muscle spasms, Decreased endurance  Visit Diagnosis: Muscle weakness (generalized)  Abnormal posture     Problem List Patient Active Problem List   Diagnosis Date Noted   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   Macular pucker, right eye 11/30/2020   Cystoid macular edema, right eye 11/30/2020   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   Vitreomacular adhesion, left 11/30/2020   Lower extremity edema 12/04/2013   Pacemaker -Medtronic    S/P AVR (aortic valve replacement) 02/21/2013   Complete heart block (HCC) 02/19/2013   Chronic diastolic heart  failure ([place]) 01/30/2013   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/26/2012   Groin hematoma 12/26/2012   Aortic stenosis    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   LEUKEMIA, 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   HYPERLIPIDEMIA 12/04/2008   DEPRESSION 12/04/2008   ASTHMA, CHILDHOOD 12/04/2008   ASTHMA 12/04/2008    Camillo Flaming Dalonte Hardage, PT 04/28/2021, 2:45 PM  [facility] [address] [place], [place], [address] Phone: [phone]   Fax:  [phone]  Name: CAMEREN BASSETTI MRN: [id number] Date of  Birth: 1940/08/31

## 2021-05-05 ENCOUNTER — Ambulatory Visit: Payer: Medicare Other | Admitting: Physical Therapy

## 2021-05-05 ENCOUNTER — Other Ambulatory Visit: Payer: Self-pay

## 2021-05-05 DIAGNOSIS — M6281 Muscle weakness (generalized): Secondary | ICD-10-CM

## 2021-05-05 DIAGNOSIS — R030 Elevated blood-pressure reading, without diagnosis of hypertension: Secondary | ICD-10-CM | POA: Diagnosis not present

## 2021-05-05 DIAGNOSIS — R293 Abnormal posture: Secondary | ICD-10-CM

## 2021-05-05 DIAGNOSIS — L600 Ingrowing nail: Secondary | ICD-10-CM | POA: Diagnosis not present

## 2021-05-05 NOTE — Therapy (Signed)
[facility] [address] [place], [place], [address] Phone: [phone]   Fax:  [phone]  Physical Therapy Treatment  Patient Details  Name: [name] MRN: [id number] Date of Birth: 1941-03-20 Referring Provider (PT): Ileana Roup, MD   Encounter Date: 05/05/2021   PT End of Session - 05/05/21 1101     Visit Number 5    Date for PT Re-Evaluation 06/17/21    Authorization Type medicare    PT Start Time 1101    PT Stop Time 1139    PT Time Calculation (min) 38 min    Activity Tolerance Patient tolerated treatment well    Behavior During Therapy [facility] for tasks assessed/performed             Past Medical History:  Diagnosis Date   Adverse effect of anesthesia    Small airway   Aortic stenosis    s/p AVR 6/14   Arthritis    Asthma    Atrioventricular block, complete ([place])    a. s/p MDT dual chamber PPM 2014   Cataract 09/2020   bilateral   CHF (congestive heart failure) ([place])    Chronic lymphocytic leukemia ([place])    followed by Dr. Ouida Sills   Depression    Difficult intubation 02/09/2021   Anterior Airway.   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)    s/p XRT and surgical resection.    Past Surgical History:  Procedure Laterality Date   AORTIC VALVE REPLACEMENT N/A 02/17/2013   Procedure: AORTIC VALVE REPLACEMENT (AVR);  Surgeon: Gaye Pollack, MD;  Location: [place];  Service: Open Heart Surgery;  Laterality: N/A;   CYST REMOVAL NECK     EYE SURGERY     INNER EAR SURGERY     INTRAOPERATIVE TRANSESOPHAGEAL ECHOCARDIOGRAM N/A 02/17/2013   Procedure: INTRAOPERATIVE TRANSESOPHAGEAL ECHOCARDIOGRAM;  Surgeon: Gaye Pollack, MD;  Location: [facility] OR;  Service: Open Heart Surgery;  Laterality: N/A;   LEFT  AND RIGHT HEART CATHETERIZATION WITH CORONARY ANGIOGRAM N/A 12/25/2012   Procedure: LEFT AND RIGHT HEART CATHETERIZATION WITH CORONARY ANGIOGRAM;  Surgeon: Jolaine Artist, MD;  Location[facility] CATH LAB;  Service: Cardiovascular;  Laterality: N/A;   PERMANENT PACEMAKER INSERTION N/A 02/19/2013   Procedure: PERMANENT PACEMAKER INSERTION;  Surgeon: Deboraha Sprang, MD;  Location: [facility] CATH LAB;  Service: Cardiovascular;  Laterality: N/A;   RECTAL BIOPSY N/A 10/08/2017   Procedure: EXCISION OF ANAL POLYP ;  Surgeon: Ileana Roup, MD;  [facility];  Service: General;  Laterality: N/A;   stapendectomy  07/30/02   right   XI ROBOT ASSISTED RECTOPEXY N/A 02/09/2021   Procedure: XI ROBOT ASSISTED SUTURE  RECTOPEXY;  Surgeon: Ileana Roup, MD;  [facility];  Service: General;  Laterality: N/A;   XRT and surgical resection     s/p. 10 years ago    There were no vitals filed for this visit.   Subjective Assessment - 05/05/21 1103     Subjective I am feeling a little sensation of having to go.  I think I am going without leakage more often.    Patient Stated Goals reduce or eliminate leakage of stool    Currently in Pain? No/denies  [place] Adult PT Treatment/Exercise - 05/05/21 0001       Lumbar Exercises: Stretches   Active Hamstring Stretch Right;Left;3 reps;20 seconds    Hip Flexor Stretch Right;Left;3 reps;20 seconds    Gastroc Stretch Right;Left;3 reps;20 seconds      Lumbar Exercises: Aerobic   UBE (Upper Arm Bike) L1 3/3 fwd/back - core work and posture throughout      Lumbar Exercises: Standing   Row Strengthening;Both;20 reps    Theraband Level (Row) Level 3 (Green)    Shoulder Extension Strengthening;Both;20 reps;Theraband    Theraband Level (Shoulder Extension) Level 3 (Green)    Other Standing Lumbar Exercises hip hinge with yellow band    Other Standing Lumbar Exercises step up fwd and lateral - 6" 10x each                           PT Long Term Goals - 05/05/21 1127       PT LONG TERM GOAL #1   Title Pt will be ind with HEP to return to maximum function at work    Status On-going      PT LONG TERM GOAL #2   Title Pt will be able to stand up for 3-4 hours in order to complete at least half shift at work without interruptions from fecal leakage    Baseline able to do 4 hour shift with smearing and not leaking    Status Partially Met      PT LONG TERM GOAL #3   Title Pt will have at [place] 50% improved sensation of when he has to have a BM.    Baseline can tell when I am going to have a BM    Status Achieved                   Plan - 05/05/21 1129     Clinical Impression Statement Pt did well with exercise progression today.  He was able to kep the back flat with functional lifting with only minimal cues and often self correcting.  Pt was able to tolerate more exercises during today's session as well.  Pt made progress with goals and is working up to 4 hours and having less leakage.  Continue with skilled PT as pt is expected to continue to make progress towards functional goals    PT Treatment/Interventions ADLs/Self Care Home Management;Biofeedback;Cryotherapy;Electrical Stimulation;Moist Heat;Therapeutic activities;Therapeutic exercise;Neuromuscular re-education;Patient/family education;Manual techniques;Taping;Dry needling;Passive range of motion    PT Next Visit Plan f/u on hip hinge add 5 lb kettlebell    PT Home Exercise Plan Access Code: [email or web address]    Consulted and Agree with Plan of Care Patient             Patient will benefit from skilled therapeutic intervention in order to improve the following deficits and impairments:  Abnormal gait, Decreased strength, Postural dysfunction, Impaired flexibility, Increased fascial restricitons, Decreased coordination, Decreased range of motion, Increased muscle spasms, Decreased endurance  Visit Diagnosis: Muscle  weakness (generalized)  Abnormal posture     Problem List Patient Active Problem List   Diagnosis Date Noted   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   Macular pucker, right eye 11/30/2020   Cystoid macular edema, right eye 11/30/2020   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   Vitreomacular adhesion, left 11/30/2020   Lower extremity edema 12/04/2013   Pacemaker -Medtronic    S/P AVR (aortic valve replacement) 02/21/2013   Complete heart block ([place]) 02/19/2013  Chronic diastolic heart failure ([place]) 01/30/2013   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/26/2012   Groin hematoma 12/26/2012   Aortic stenosis    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, [place], [place], [address] Phone: [phone]   Fax:  [phone]  Name: [name] MRN: [id number] Date of Birth: 24-Nov-1940

## 2021-05-11 ENCOUNTER — Other Ambulatory Visit: Payer: Self-pay

## 2021-05-11 ENCOUNTER — Encounter: Payer: Self-pay | Admitting: Physical Therapy

## 2021-05-11 ENCOUNTER — Ambulatory Visit: Payer: Medicare Other | Admitting: Physical Therapy

## 2021-05-11 DIAGNOSIS — M6281 Muscle weakness (generalized): Secondary | ICD-10-CM | POA: Diagnosis not present

## 2021-05-11 DIAGNOSIS — R293 Abnormal posture: Secondary | ICD-10-CM | POA: Diagnosis not present

## 2021-05-11 NOTE — Therapy (Signed)
[facility] [address], [place], [place], [address] Phone: [phone]   Fax:  [phone]  Physical Therapy Treatment  Patient Details  Name: [name] MRN: [id number] Date of Birth: 07/24/1941 Referring Provider (PT): Ileana Roup, MD   Encounter Date: 05/11/2021   PT End of Session - 05/11/21 1105     Visit Number 6    Date for PT Re-Evaluation 06/17/21    Authorization Type medicare    PT Start Time 1102    PT Stop Time 1142    PT Time Calculation (min) 40 min    Activity Tolerance Patient tolerated treatment well    Behavior During Therapy [facility] for tasks assessed/performed             Past Medical History:  Diagnosis Date   Adverse effect of anesthesia    Small airway   Aortic stenosis    s/p AVR 6/14   Arthritis    Asthma    Atrioventricular block, complete ([place])    a. s/p MDT dual chamber PPM 2014   Cataract 09/2020   bilateral   CHF (congestive heart failure) ([place])    Chronic lymphocytic leukemia ([place])    followed by Dr. Ouida Sills   Depression    Difficult intubation 02/09/2021   Anterior Airway.   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    Past Surgical History:  Procedure Laterality Date   AORTIC VALVE REPLACEMENT N/A 02/17/2013   Procedure: AORTIC VALVE REPLACEMENT (AVR);  Surgeon: Gaye Pollack, MD;  Location: [place];  Service: Open Heart Surgery;  Laterality: N/A;   CYST REMOVAL NECK     EYE SURGERY     INNER EAR SURGERY     INTRAOPERATIVE TRANSESOPHAGEAL ECHOCARDIOGRAM N/A 02/17/2013   Procedure: INTRAOPERATIVE TRANSESOPHAGEAL ECHOCARDIOGRAM;  Surgeon: Gaye Pollack, MD;  Location: [facility] OR;  Service: Open Heart Surgery;  Laterality: N/A;    LEFT AND RIGHT HEART CATHETERIZATION WITH CORONARY ANGIOGRAM N/A 12/25/2012   Procedure: LEFT AND RIGHT HEART CATHETERIZATION WITH CORONARY ANGIOGRAM;  Surgeon: Jolaine Artist, MD;  Location: [facility] CATH LAB;  Service: Cardiovascular;  Laterality: N/A;   PERMANENT PACEMAKER INSERTION N/A 02/19/2013   Procedure: PERMANENT PACEMAKER INSERTION;  Surgeon: Deboraha Sprang, MD;  Location: [facility] CATH LAB;  Service: Cardiovascular;  Laterality: N/A;   RECTAL BIOPSY N/A 10/08/2017   Procedure: EXCISION OF ANAL POLYP ;  Surgeon: Ileana Roup, MD;  [facility];  Service: General;  Laterality: N/A;   stapendectomy  07/30/02   right   XI ROBOT ASSISTED RECTOPEXY N/A 02/09/2021   Procedure: XI ROBOT ASSISTED SUTURE  RECTOPEXY;  Surgeon: Ileana Roup, MD;  Location: [facility];  Service: General;  Laterality: N/A;   XRT and surgical resection     s/p. 10 years ago    There were no vitals filed for this visit.   Subjective Assessment - 05/11/21 1104     Subjective Pt is doing well but had to go on antibiotics and that messed his stomach up so no progress noticed this week    Patient Stated Goals reduce or eliminate leakage of stool    Currently in Pain?  No/denies                               [facility] Adult PT Treatment/Exercise - 05/11/21 0001       Lumbar Exercises: Stretches   Active Hamstring Stretch Right;Left;3 reps;20 seconds    Quad Stretch Right;Left;1 rep;60 seconds    Gastroc Stretch Right;Left;3 reps;20 seconds      Lumbar Exercises: Aerobic   Nustep L1 x 3' L2 x 3'; PT present for status update      Lumbar Exercises: Machines for Strengthening   Leg Press 45lb bil - kegel with slow lowering    Other Lumbar Machine Exercise chop D1 diagonals - power tower 15 lb      Lumbar Exercises: Standing   Other Standing Lumbar Exercises single leg hip hinge    Other Standing Lumbar Exercises step up lateral - 6" 10x each; hip abduction red band 10x each side       Lumbar Exercises: Seated   Other Seated Lumbar Exercises seated on ball - kegel with diagonal and horizontal abduction 15x each side                          PT Long Term Goals - 05/05/21 1127       PT LONG TERM GOAL #1   Title Pt will be ind with HEP to return to maximum function at work    Status On-going      PT LONG TERM GOAL #2   Title Pt will be able to stand up for 3-4 hours in order to complete at least half shift at work without interruptions from fecal leakage    Baseline able to do 4 hour shift with smearing and not leaking    Status Partially Met      PT LONG TERM GOAL #3   Title Pt will have at [place] 50% improved sensation of when he has to have a BM.    Baseline can tell when I am going to have a BM    Status Achieved                   Plan - 05/11/21 1143     Clinical Impression Statement Pt continues to demonstrate more strength and doing more challenging exericses.  No progress towards goals due to setback from antibiotic medicine.  Pt was able to do single leg dead lift for functional lifting of light object.  Reviewed ways to incorporate exercises into his work day so he doesn't have to do as much when he gets home and is tired.    PT Treatment/Interventions ADLs/Self Care Home Management;Biofeedback;Cryotherapy;Electrical Stimulation;Moist Heat;Therapeutic activities;Therapeutic exercise;Neuromuscular re-education;Patient/family education;Manual techniques;Taping;Dry needling;Passive range of motion    PT Next Visit Plan f/u on HEP and goals; seated ball ex's, resistace upper body and diagonals    PT Home Exercise Plan Access Code: [email or web address]    Consulted and Agree with Plan of Care Patient             Patient will benefit from skilled therapeutic intervention in order to improve the following deficits and impairments:  Abnormal gait, Decreased strength, Postural dysfunction, Impaired flexibility, Increased fascial restricitons,  Decreased coordination, Decreased range of motion, Increased muscle spasms, Decreased endurance  Visit Diagnosis: Muscle weakness (generalized)  Abnormal posture     Problem List Patient Active Problem List   Diagnosis Date Noted   Rectal prolapse  02/09/2021   Macular pucker, right eye 11/30/2020   Cystoid macular edema, right eye 11/30/2020   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   Vitreomacular adhesion, left 11/30/2020   Lower extremity edema 12/04/2013   Pacemaker -Medtronic    S/P AVR (aortic valve replacement) 02/21/2013   Complete heart block (HCC) 02/19/2013   Chronic diastolic heart failure ([place]) 01/30/2013   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/26/2012   Groin hematoma 12/26/2012   Aortic stenosis    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   LEUKEMIA, 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   HYPERLIPIDEMIA 12/04/2008   DEPRESSION 12/04/2008   ASTHMA, CHILDHOOD 12/04/2008   ASTHMA 12/04/2008    Camillo Flaming Dakiya Puopolo, PT 05/11/2021, 11:46 AM  [facility] [address] [place], [place], [address] Phone: [phone]   Fax:  [phone]  Name: [name] MRN: [id number] Date of Birth: [birth year]

## 2021-05-19 ENCOUNTER — Other Ambulatory Visit: Payer: Self-pay

## 2021-05-19 ENCOUNTER — Ambulatory Visit: Payer: Medicare Other | Admitting: Physical Therapy

## 2021-05-19 DIAGNOSIS — M6281 Muscle weakness (generalized): Secondary | ICD-10-CM

## 2021-05-19 DIAGNOSIS — R293 Abnormal posture: Secondary | ICD-10-CM | POA: Diagnosis not present

## 2021-05-19 DIAGNOSIS — Z23 Encounter for immunization: Secondary | ICD-10-CM | POA: Diagnosis not present

## 2021-05-19 NOTE — Therapy (Signed)
[facility] [address], [place], [place], [address] Phone: [phone]   Fax:  [phone]  Physical Therapy Treatment  Patient Details  Name: [name] MRN: [id number] Date of Birth: 16-Apr-1941 Referring Provider (PT): Ileana Roup, MD   Encounter Date: 05/19/2021   PT End of Session - 05/19/21 1112     Visit Number 7    Date for PT Re-Evaluation 06/17/21    Authorization Type medicare    PT Start Time 1100    PT Stop Time 1140    PT Time Calculation (min) 40 min    Activity Tolerance Patient tolerated treatment well    Behavior During Therapy [facility] for tasks assessed/performed             Past Medical History:  Diagnosis Date   Adverse effect of anesthesia    Small airway   Aortic stenosis    s/p AVR 6/14   Arthritis    Asthma    Atrioventricular block, complete ([place])    a. s/p MDT dual chamber PPM 2014   Cataract 09/2020   bilateral   CHF (congestive heart failure) ([place])    Chronic lymphocytic leukemia ([place])    followed by Dr. Ouida Sills   Depression    Difficult intubation 02/09/2021   Anterior Airway.   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    Past Surgical History:  Procedure Laterality Date   AORTIC VALVE REPLACEMENT N/A 02/17/2013   Procedure: AORTIC VALVE REPLACEMENT (AVR);  Surgeon: Gaye Pollack, MD;  Location: [place];  Service: Open Heart Surgery;  Laterality: N/A;   CYST REMOVAL NECK     EYE SURGERY     INNER EAR SURGERY     INTRAOPERATIVE TRANSESOPHAGEAL ECHOCARDIOGRAM N/A 02/17/2013   Procedure: INTRAOPERATIVE TRANSESOPHAGEAL ECHOCARDIOGRAM;  Surgeon: Gaye Pollack, MD;  Location: [facility] OR;  Service: Open Heart Surgery;  Laterality: N/A;    LEFT AND RIGHT HEART CATHETERIZATION WITH CORONARY ANGIOGRAM N/A 12/25/2012   Procedure: LEFT AND RIGHT HEART CATHETERIZATION WITH CORONARY ANGIOGRAM;  Surgeon: Jolaine Artist, MD;  Location: [facility] CATH LAB;  Service: Cardiovascular;  Laterality: N/A;   PERMANENT PACEMAKER INSERTION N/A 02/19/2013   Procedure: PERMANENT PACEMAKER INSERTION;  Surgeon: Deboraha Sprang, MD;  Location: [facility] CATH LAB;  Service: Cardiovascular;  Laterality: N/A;   RECTAL BIOPSY N/A 10/08/2017   Procedure: EXCISION OF ANAL POLYP ;  Surgeon: Ileana Roup, MD;  [facility];  Service: General;  Laterality: N/A;   stapendectomy  07/30/02   right   XI ROBOT ASSISTED RECTOPEXY N/A 02/09/2021   Procedure: XI ROBOT ASSISTED SUTURE  RECTOPEXY;  Surgeon: Ileana Roup, MD;  [facility];  Service: General;  Laterality: N/A;   XRT and surgical resection     s/p. 10 years ago    There were no vitals filed for this visit.   Subjective Assessment - 05/19/21 1328     Subjective It's better than last week    Patient Stated Goals reduce or eliminate leakage of stool    Currently in Pain? No/denies  [place] Adult PT Treatment/Exercise - 05/19/21 0001       Lumbar Exercises: Stretches   Figure 4 Stretch 3 reps;20 seconds    Other Lumbar Stretch Exercise hip rotation stretch; hip and trunk included with UE/LE going opposite ways      Lumbar Exercises: Machines for Strengthening   Leg Press 50 lb bil - kegel with slow lowering      Lumbar Exercises: Standing   Other Standing Lumbar Exercises hip hinge with 6lb to stool      Lumbar Exercises: Seated   Sit to Stand 20 reps   10 with band, 10 with ball     Lumbar Exercises: Supine   Bridge 20 reps;5 seconds   with blue loop                         PT Long Term Goals - 05/19/21 1102       PT LONG TERM GOAL #1   Title Pt will be ind with HEP to return to maximum function at work     Baseline still have to go to the bathroom every 1.5 hours    Status On-going      PT LONG TERM GOAL #2   Title Pt will be able to stand up for 3-4 hours in order to complete at least half shift at work without interruptions from fecal leakage    Baseline able to do 4 hour shift but 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    Baseline can tell when I am going to have a BM all the time now    Status Achieved                   Plan - 05/19/21 1328     Clinical Impression Statement Pt is continueing to demonstrate improvement with increased resistance and exercise difficulty. Pt will benefit from skilled PT to continue working on endurance as well as strength and he was able to hold for 5 seconds.  Will need skilled PT to continue wroking on progression of exercises.    PT Treatment/Interventions ADLs/Self Care Home Management;Biofeedback;Cryotherapy;Electrical Stimulation;Moist Heat;Therapeutic activities;Therapeutic exercise;Neuromuscular re-education;Patient/family education;Manual techniques;Taping;Dry needling;Passive range of motion    PT Next Visit Plan endurance with 5+ sec holding, upper body exercises - diagonals on ball    PT Home Exercise Plan Access Code: [email or web address] and Agree with Plan of Care Patient             Patient will benefit from skilled therapeutic intervention in order to improve the following deficits and impairments:  Abnormal gait, Decreased strength, Postural dysfunction, Impaired flexibility, Increased fascial restricitons, Decreased coordination, Decreased range of motion, Increased muscle spasms, Decreased endurance  Visit Diagnosis: Muscle weakness (generalized)  Abnormal posture     Problem List Patient Active Problem List   Diagnosis Date Noted   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    Macular pucker, right eye 11/30/2020   Cystoid macular edema, right eye 11/30/2020   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   Vitreomacular adhesion, left 11/30/2020   Lower extremity edema 12/04/2013   Pacemaker -Medtronic    S/P AVR (aortic valve replacement) 02/21/2013   Complete heart block (HCC) 02/19/2013   Chronic diastolic heart  failure ([place]) 01/30/2013   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/26/2012   Groin hematoma 12/26/2012   Aortic stenosis    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   LEUKEMIA, 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   HYPERLIPIDEMIA 12/04/2008   DEPRESSION 12/04/2008   ASTHMA, CHILDHOOD 12/04/2008   ASTHMA 12/04/2008    Camillo Flaming Jonny Dearden, PT 05/19/2021, 1:47 PM  [facility] [address], [place], [place], [address] Phone: [phone]   Fax:  [phone]  Name: [name] MRN: [id number] Date of Birth: 07/10/1941

## 2021-05-26 ENCOUNTER — Other Ambulatory Visit: Payer: Self-pay

## 2021-05-26 ENCOUNTER — Encounter: Payer: Self-pay | Admitting: Physical Therapy

## 2021-05-26 ENCOUNTER — Ambulatory Visit: Payer: Medicare Other | Admitting: Physical Therapy

## 2021-05-26 DIAGNOSIS — M6281 Muscle weakness (generalized): Secondary | ICD-10-CM | POA: Diagnosis not present

## 2021-05-26 DIAGNOSIS — R293 Abnormal posture: Secondary | ICD-10-CM | POA: Diagnosis not present

## 2021-05-26 DIAGNOSIS — Z23 Encounter for immunization: Secondary | ICD-10-CM | POA: Diagnosis not present

## 2021-05-26 NOTE — Therapy (Signed)
[facility] [address] [place], [place], [address] Phone: [phone]   Fax:  [phone]  Physical Therapy Treatment  Patient Details  Name: [name] MRN: [id number] Date of Birth: May 17, 1941 Referring Provider (PT): Ileana Roup, MD   Encounter Date: 05/26/2021   PT End of Session - 05/26/21 1153     Visit Number 8    Date for PT Re-Evaluation 06/17/21    Authorization Type medicare    PT Start Time 1100    PT Stop Time 1140    PT Time Calculation (min) 40 min    Activity Tolerance Patient tolerated treatment well    Behavior During Therapy [facility] for tasks assessed/performed             Past Medical History:  Diagnosis Date   Adverse effect of anesthesia    Small airway   Aortic stenosis    s/p AVR 6/14   Arthritis    Asthma    Atrioventricular block, complete ([place])    a. s/p MDT dual chamber PPM 2014   Cataract 09/2020   bilateral   CHF (congestive heart failure) ([place])    Chronic lymphocytic leukemia ([place])    followed by Dr. Ouida Sills   Depression    Difficult intubation 02/09/2021   Anterior Airway.   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    Past Surgical History:  Procedure Laterality Date   AORTIC VALVE REPLACEMENT N/A 02/17/2013   Procedure: AORTIC VALVE REPLACEMENT (AVR);  Surgeon: Gaye Pollack, MD;  Location: [place];  Service: Open Heart Surgery;  Laterality: N/A;   CYST REMOVAL NECK     EYE SURGERY     INNER EAR SURGERY     INTRAOPERATIVE TRANSESOPHAGEAL ECHOCARDIOGRAM N/A 02/17/2013   Procedure: INTRAOPERATIVE TRANSESOPHAGEAL ECHOCARDIOGRAM;  Surgeon: Gaye Pollack, MD;  Location: [facility] OR;  Service: Open Heart Surgery;  Laterality: N/A;    LEFT AND RIGHT HEART CATHETERIZATION WITH CORONARY ANGIOGRAM N/A 12/25/2012   Procedure: LEFT AND RIGHT HEART CATHETERIZATION WITH CORONARY ANGIOGRAM;  Surgeon: Jolaine Artist, MD;  Location: [facility] CATH LAB;  Service: Cardiovascular;  Laterality: N/A;   PERMANENT PACEMAKER INSERTION N/A 02/19/2013   Procedure: PERMANENT PACEMAKER INSERTION;  Surgeon: Deboraha Sprang, MD;  Location: [facility] CATH LAB;  Service: Cardiovascular;  Laterality: N/A;   RECTAL BIOPSY N/A 10/08/2017   Procedure: EXCISION OF ANAL POLYP ;  Surgeon: Ileana Roup, MD;  [facility];  Service: General;  Laterality: N/A;   stapendectomy  07/30/02   right   XI ROBOT ASSISTED RECTOPEXY N/A 02/09/2021   Procedure: XI ROBOT ASSISTED SUTURE  RECTOPEXY;  Surgeon: Ileana Roup, MD;  [facility];  Service: General;  Laterality: N/A;   XRT and surgical resection     s/p. 10 years ago    There were no vitals filed for this visit.   Subjective Assessment - 05/26/21 1148     Subjective I am about the same at work.  Still having trouble gettingeverything out at once and trying not to strain.    Patient Stated Goals reduce or eliminate leakage of stool    Currently in Pain? No/denies  [place] Adult PT Treatment/Exercise - 05/26/21 0001       Lumbar Exercises: Stretches   Other Lumbar Stretch Exercise hip rotation stretch; hip and trunk included with UE/LE going opposite ways      Lumbar Exercises: Aerobic   UBE (Upper Arm Bike) L1 3/3 fwd/back - core work and posture throughout      Lumbar Exercises: Restaurant manager, fast food;Theraband    Theraband Level (Row) Level 4 (Blue)    Shoulder Extension Strengthening;Both;20 reps;Theraband    Theraband Level (Shoulder Extension) Level 3 (Green)    Other Standing Lumbar Exercises standing on foam mat - UE flex, row, and abd 2lb; standing with toe and heel raise - 20x                     PT Education  - 05/26/21 1149     Education Details reviewed toileting techniques and gave new handout; added to [employer]) Educated Patient    Methods Explanation;Demonstration;Tactile cues;Verbal cues;Handout    Comprehension Verbalized understanding;Returned demonstration                 PT Long Term Goals - 05/26/21 1153       PT LONG TERM GOAL #1   Title Pt will be ind with HEP to return to maximum function at work    Status On-going      PT LONG TERM GOAL #2   Title Pt will be able to stand up for 3-4 hours in order to complete at least half shift at work without interruptions from fecal leakage    Status Partially Met      PT LONG TERM GOAL #3   Title Pt will have at [place] 50% improved sensation of when he has to have a BM.    Status Achieved                   Plan - 05/26/21 1150     Clinical Impression Statement Pt did well with exercise progression and gave him a blue band to use at home. Pt was able to demonstrate improved posture and breathing in sitting with correct toietin posture.  PT believes this clarification and review will help him have more complete BMs.  Will need one more visit to ensure he is ind with toileting and HEP to continue to make stready progress with strength.    PT Treatment/Interventions ADLs/Self Care Home Management;Biofeedback;Cryotherapy;Electrical Stimulation;Moist Heat;Therapeutic activities;Therapeutic exercise;Neuromuscular re-education;Patient/family education;Manual techniques;Taping;Dry needling;Passive range of motion    PT Next Visit Plan f/u on toileting; endurance with 5+ sec holding, upper body exercises - diagonals on ball    PT Home Exercise Plan Access Code: [email or web address] and Agree with Plan of Care Patient             Patient will benefit from skilled therapeutic intervention in order to improve the following deficits and impairments:  Abnormal gait, Decreased strength, Postural dysfunction, Impaired  flexibility, Increased fascial restricitons, Decreased coordination, Decreased range of motion, Increased muscle spasms, Decreased endurance  Visit Diagnosis: Muscle weakness (generalized)  Abnormal posture     Problem List Patient Active Problem List   Diagnosis Date Noted   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   Macular pucker, right eye 11/30/2020   Cystoid macular edema, right eye 11/30/2020   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   Vitreomacular adhesion, left 11/30/2020   Lower extremity edema 12/04/2013   Pacemaker -Medtronic    S/P  AVR (aortic valve replacement) 02/21/2013   Complete heart block (HCC) 02/19/2013   Chronic diastolic heart failure ([place]) 01/30/2013   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/26/2012   Groin hematoma 12/26/2012   Aortic stenosis    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   LEUKEMIA, 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   HYPERLIPIDEMIA 12/04/2008   DEPRESSION 12/04/2008   ASTHMA, CHILDHOOD 12/04/2008   ASTHMA 12/04/2008    Camillo Flaming Caetano Oberhaus, PT 05/26/2021, 12:31 PM  [facility] [address] [place], [place], [address] Phone: [phone]   Fax:  [phone]  Name: [name] MRN: [id number] Date of Birth: 15-Mar-1941

## 2021-06-08 ENCOUNTER — Encounter (INDEPENDENT_AMBULATORY_CARE_PROVIDER_SITE_OTHER): Payer: Self-pay | Admitting: Ophthalmology

## 2021-06-08 ENCOUNTER — Other Ambulatory Visit: Payer: Self-pay

## 2021-06-08 ENCOUNTER — Ambulatory Visit (INDEPENDENT_AMBULATORY_CARE_PROVIDER_SITE_OTHER): Payer: Medicare Other | Admitting: Ophthalmology

## 2021-06-08 DIAGNOSIS — H35371 Puckering of macula, right eye: Secondary | ICD-10-CM

## 2021-06-08 DIAGNOSIS — H35351 Cystoid macular degeneration, right eye: Secondary | ICD-10-CM

## 2021-06-08 DIAGNOSIS — H4321 Crystalline deposits in vitreous body, right eye: Secondary | ICD-10-CM

## 2021-06-08 MED ORDER — KETOROLAC TROMETHAMINE 0.5 % OP SOLN: 1.0000 [drp] | Freq: Four times a day (QID) | OPHTHALMIC | 5 refills | Status: DC

## 2021-06-08 MED ORDER — KETOROLAC TROMETHAMINE 0.5 % OP SOLN: 1.0000 [drp] | Freq: Four times a day (QID) | OPHTHALMIC | 0 refills | Status: DC

## 2021-06-08 NOTE — Assessment & Plan Note (Signed)
Resolved and improving contour post vitrectomy

## 2021-06-08 NOTE — Assessment & Plan Note (Signed)
Minor perifoveal CME, will restart ketorolac 1 drop right eye 4 times daily.  I informed the patient that this drop is likely to staying for 15-30 to seconds after its use each each application

## 2021-06-08 NOTE — Assessment & Plan Note (Signed)
Resolved post vitrectomy 

## 2021-06-08 NOTE — Progress Notes (Signed)
06/08/2021     CHIEF COMPLAINT Patient presents for  Chief Complaint  Patient presents with   Post-op Follow-up      HISTORY OF PRESENT ILLNESS: [name] is a 80 y.o. male who presents to the clinic today for:   HPI     Post-op Follow-up   In right eye.  Vision is blurred at distance and is blurred at near.        Comments   8 week post op od oct sx 04/06/2021 Pt states,"I am still blurry in my right eye. I feel like it is at a distance and at near. I was not able to read a sign in your lobby. I have a picture in my hallway that I focus on and I cannot make out the faces so I cannot say that my vision is right. A couple of weeks ago I started to see a very pretty yellowish floater that has now gone away." Pt completed all drops      Last edited by Kendra Opitz, COA on 06/08/2021  9:00 AM.      Referring physician: Jonathon Jordan, MD [facility] [address],  [place] [address]  HISTORICAL INFORMATION:   Selected notes from the MEDICAL RECORD NUMBER    Lab Results  Component Value Date   HGBA1C 5.9 (H) 02/09/2021     CURRENT MEDICATIONS: Current Outpatient Medications (Ophthalmic Drugs)  Medication Sig   ketorolac (ACULAR) 0.5 % ophthalmic solution Place 1 drop into the right eye 4 (four) times daily.   cycloSPORINE (RESTASIS) 0.05 % ophthalmic emulsion Place 1 drop into both eyes 2 (two) times daily.   ketorolac (ACULAR) 0.5 % ophthalmic solution Place 1 drop into the right eye 4 (four) times daily.   Polyethyl Glycol-Propyl Glycol (SYSTANE OP) Place 1 drop into both eyes 2 (two) times daily.   No current facility-administered medications for this visit. (Ophthalmic Drugs)   Current Outpatient Medications (Other)  Medication Sig   acetaminophen (TYLENOL) 500 MG tablet Take 500 mg by mouth 3 (three) times daily.   aspirin 81 MG tablet Take 1 tablet (81 mg total) by mouth daily.   busPIRone (BUSPAR) 15 MG tablet Take 15-30 mg by  mouth See admin instructions. Take 15 mg by mouth every morning and 30 mg every day at noon   Cholecalciferol (VITAMIN D) 2000 units tablet Take 2,000 Units by mouth every other day.    fenofibrate 160 MG tablet Take 160 mg by mouth daily.   fluticasone (FLONASE) 50 MCG/ACT nasal spray Place 2 sprays into the nose daily.   FORA V12 BLOOD GLUCOSE TEST test strip    furosemide (LASIX) 40 MG tablet TAKE ONE TABLET TWICE DAILY   gabapentin (NEURONTIN) 300 MG capsule Take 300-600 mg by mouth See admin instructions. Take 300 mg by mouth each morning and 600 mg each afternoon.   LITETOUCH LANCETS MISC    montelukast (SINGULAIR) 10 MG tablet Take 10 mg by mouth daily.   Multiple Vitamin (MULTIVITAMIN WITH MINERALS) TABS tablet Take 1 tablet by mouth daily.   polyethylene glycol (MIRALAX / GLYCOLAX) 17 g packet Take 17 g by mouth daily.   potassium chloride SA (K-DUR,KLOR-CON) 20 MEQ tablet TAKE ONE TABLET EVERY DAY   Probiotic Product (PHILLIPS COLON HEALTH PO) Take 1 capsule by mouth daily.   sertraline (ZOLOFT) 100 MG tablet Take 100 mg by mouth daily.   traZODone (DESYREL) 50 MG tablet Take 150 mg by mouth at  bedtime.   No current facility-administered medications for this visit. (Other)      REVIEW OF SYSTEMS:    ALLERGIES Allergies  Allergen Reactions   Banana Anaphylaxis   Penicillins Rash    Has patient had a PCN reaction causing immediate rash, facial/tongue/throat swelling, SOB or lightheadedness with hypotension: Unknown Has patient had a PCN reaction causing severe rash involving mucus membranes or skin necrosis: Unknown Has patient had a PCN reaction that required hospitalization: No Has patient had a PCN reaction occurring within the last 10 years: No If all of the above answers are "NO", then may proceed with Cephalosporin use.     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 Ouida Sills   Depression    Difficult intubation 02/09/2021   Anterior Airway.   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   Past Surgical History:  Procedure Laterality Date   AORTIC VALVE REPLACEMENT N/A 02/17/2013   Procedure: AORTIC VALVE REPLACEMENT (AVR);  Surgeon: Gaye Pollack, MD;  Location: [facility];  Service: Open Heart Surgery;  Laterality: N/A;   CYST REMOVAL NECK     EYE SURGERY     INNER EAR SURGERY     INTRAOPERATIVE TRANSESOPHAGEAL ECHOCARDIOGRAM N/A 02/17/2013   Procedure: INTRAOPERATIVE TRANSESOPHAGEAL ECHOCARDIOGRAM;  Surgeon: Gaye Pollack, MD;  Location: [facility] OR;  Service: Open Heart Surgery;  Laterality: N/A;   LEFT AND RIGHT HEART CATHETERIZATION WITH CORONARY ANGIOGRAM N/A 12/25/2012   Procedure: LEFT AND RIGHT HEART CATHETERIZATION WITH CORONARY ANGIOGRAM;  Surgeon: Jolaine Artist, MD;  Location: [facility] CATH LAB;  Service: Cardiovascular;  Laterality: N/A;   PERMANENT PACEMAKER INSERTION N/A 02/19/2013   Procedure: PERMANENT PACEMAKER INSERTION;  Surgeon: Deboraha Sprang, MD;  Location: [facility] CATH LAB;  Service: Cardiovascular;  Laterality: N/A;   RECTAL BIOPSY N/A 10/08/2017   Procedure: EXCISION OF ANAL POLYP ;  Surgeon: Ileana Roup, MD;  Location: [facility] ORS;  Service: General;  Laterality: N/A;   stapendectomy  07/30/02   right   XI ROBOT ASSISTED RECTOPEXY N/A 02/09/2021   Procedure: XI ROBOT ASSISTED SUTURE  RECTOPEXY;  Surgeon: Ileana Roup, MD;  Location: [facility] ORS;  Service: General;  Laterality: N/A;    XRT and surgical resection     s/p. 10 years ago    FAMILY HISTORY Family History  Problem Relation Age of Onset   Brain cancer Mother    Cancer Father        lung    SOCIAL HISTORY Social History   Tobacco Use   Smoking status: Never   Smokeless tobacco: Never  Vaping Use   Vaping Use: Never used  Substance Use Topics   Alcohol use: No   Drug use: No         OPHTHALMIC  EXAM:  Base Eye Exam     Visual Acuity (ETDRS)       Right Left   Dist cc 20/60 -1 20/20 -1   Dist ph cc 20/50 -1     Correction: Glasses         Tonometry (Tonopen, 9:04 AM)       Right Left   Pressure 11 09         Pupils       Pupils Dark Light Shape React APD   Right PERRL 4 3 Round Brisk None   Left PERRL 4 3 Round Brisk None         Visual Fields (Counting fingers)       Left Right    Full Full         Extraocular Movement       Right Left    Full Full         Neuro/Psych     Oriented x3: Yes   Mood/Affect: Normal         Dilation     Right eye: 1.0% Mydriacyl, 2.5% Phenylephrine @ 9:04 AM           Slit Lamp and Fundus Exam     External Exam       Right Left   External Normal Normal         Slit Lamp Exam       Right Left   Lids/Lashes Normal Normal   Conjunctiva/Sclera White and quiet White and quiet   Cornea Clear Clear   Anterior Chamber Deep and quiet Deep and quiet   Iris Round and reactive Round and reactive   Lens Centered posterior chamber intraocular lens Centered posterior chamber intraocular lens   Anterior Vitreous Clear avitric Normal         Fundus Exam       Right Left   Posterior Vitreous Clear avitric    Disc Peripapillary atrophy    C/D Ratio 0.4    Macula No topographic distortion remains post ILM removal changes present    Vessels Normal    Periphery Normal             IMAGING AND PROCEDURES  Imaging and Procedures for 06/08/21  OCT, Retina - OU - Both Eyes       Right Eye Quality was  good. Scan locations included subfoveal. Central Foveal Thickness: 396. Progression has no prior data. Findings include abnormal foveal contour, cystoid macular edema, epiretinal membrane, vitreomacular adhesion .   Left Eye Quality was good. Scan locations included subfoveal. Central Foveal Thickness: 295. Progression has no prior data. Findings include vitreomacular adhesion , normal foveal contour.   Notes CME Minor OD post vitrectomy ILM peel for epiretinal membrane and asteroid hyalosis.  Overall improved OS with incidental VMA, no topographic distortions             ASSESSMENT/PLAN:  Cystoid macular edema, right eye Minor perifoveal CME, will restart ketorolac 1 drop right eye 4 times daily.  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 Macular pucker, right eye  H35.371 OCT, Retina - OU - Both Eyes    2. Cystoid macular edema, right eye  H35.351     3. Asteroid hyalosis of  right eye  H43.21       1.  Mild perifoveal CME OD with blurred vision symptomatic.  Overall macular contour has improved post vitrectomy membrane peel however.  At 8 weeks post surgery.  2.  We will add topical NSAID, ketorolac 1 drop 4 times daily for generic coverage  3.  Ophthalmic Meds Ordered this visit:  Meds ordered this encounter  Medications   ketorolac (ACULAR) 0.5 % ophthalmic solution    Sig: Place 1 drop into the right eye 4 (four) times daily.    Dispense:  5 mL    Refill:  0   ketorolac (ACULAR) 0.5 % ophthalmic solution    Sig: Place 1 drop into the right eye 4 (four) times daily.    Dispense:  5 mL    Refill:  5       Return in about 8 weeks (around 08/03/2021) for dilate, OD, OCT.  There are no Patient Instructions on file for this  visit.   Explained the diagnoses, plan, and follow up with the patient and they expressed understanding.  Patient expressed understanding of the importance of proper follow up care.   Clent Demark Jyssica Rief M.D. Diseases & Surgery of the Retina and Vitreous [facility]ina [facility] Diabetic [place] 06/08/21     Abbreviations: M myopia (nearsighted); A astigmatism; H hyperopia (farsighted); P presbyopia; Mrx spectacle prescription;  CTL contact lenses; OD right eye; OS left eye; OU both eyes  XT exotropia; ET esotropia; PEK punctate epithelial keratitis; PEE punctate epithelial erosions; DES dry eye syndrome; MGD meibomian gland dysfunction; ATs artificial tears; PFAT's preservative free artificial tears; Conrad nuclear sclerotic cataract; PSC posterior subcapsular cataract; ERM epi-retinal membrane; PVD posterior vitreous detachment; RD retinal detachment; DM diabetes mellitus; DR diabetic retinopathy; NPDR non-proliferative diabetic retinopathy; PDR proliferative diabetic retinopathy; CSME clinically significant macular edema; DME diabetic macular edema; dbh dot blot hemorrhages; CWS cotton wool spot; POAG primary open angle glaucoma; C/D cup-to-disc ratio; HVF humphrey visual field; GVF goldmann visual field; OCT optical coherence tomography; IOP intraocular pressure; BRVO Branch retinal vein occlusion; CRVO central retinal vein occlusion; CRAO central retinal artery occlusion; BRAO branch retinal artery occlusion; RT retinal tear; SB scleral buckle; PPV pars plana vitrectomy; VH Vitreous hemorrhage; PRP panretinal laser photocoagulation; IVK intravitreal kenalog; VMT vitreomacular traction; MH Macular hole;  NVD neovascularization of the disc; NVE neovascularization elsewhere; AREDS age related eye disease study; ARMD age related macular degeneration; POAG primary open angle glaucoma; EBMD epithelial/anterior basement membrane dystrophy; ACIOL anterior chamber intraocular lens; IOL intraocular lens; PCIOL posterior chamber  intraocular lens; Phaco/IOL phacoemulsification with intraocular lens placement; Rand photorefractive keratectomy; LASIK laser assisted in situ keratomileusis; HTN hypertension; DM diabetes mellitus; COPD chronic obstructive pulmonary disease

## 2021-06-16 ENCOUNTER — Ambulatory Visit: Payer: Medicare Other | Attending: Surgery | Admitting: Physical Therapy

## 2021-06-16 ENCOUNTER — Other Ambulatory Visit: Payer: Self-pay

## 2021-06-16 ENCOUNTER — Encounter: Payer: Self-pay | Admitting: Physical Therapy

## 2021-06-16 DIAGNOSIS — M6281 Muscle weakness (generalized): Secondary | ICD-10-CM | POA: Diagnosis not present

## 2021-06-16 DIAGNOSIS — R293 Abnormal posture: Secondary | ICD-10-CM | POA: Diagnosis not present

## 2021-06-16 NOTE — Therapy (Addendum)
[facility] @ [place], [place], [address] Phone: [phone]   Fax:  [phone]  Physical Therapy Treatment  Patient Details  Name: NOLLIE TERLIZZI MRN: [id number] Date of Birth: 01/18/1941 Referring Provider (PT): Ileana Roup, MD   Encounter Date: 06/16/2021   PT End of Session - 06/16/21 1119     Visit Number 9    Date for PT Re-Evaluation 06/17/21    Authorization Type medicare    PT Start Time 1105    PT Stop Time 1145    PT Time Calculation (min) 40 min    Activity Tolerance Patient tolerated treatment well    Behavior During Therapy [facility] for tasks assessed/performed             Past Medical History:  Diagnosis Date   Adverse effect of anesthesia    Small airway   Aortic stenosis    s/p AVR 6/14   Arthritis    Asthma    Atrioventricular block, 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 Ouida Sills   Depression    Difficult intubation 02/09/2021   Anterior Airway.   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    Past Surgical History:  Procedure Laterality Date   AORTIC VALVE REPLACEMENT N/A 02/17/2013   Procedure: AORTIC VALVE REPLACEMENT (AVR);  Surgeon: Gaye Pollack, MD;  Location: [facility];  Service: Open Heart Surgery;  Laterality: N/A;   CYST REMOVAL NECK     EYE SURGERY     INNER EAR SURGERY     INTRAOPERATIVE TRANSESOPHAGEAL ECHOCARDIOGRAM N/A 02/17/2013   Procedure: INTRAOPERATIVE TRANSESOPHAGEAL ECHOCARDIOGRAM;  Surgeon: Gaye Pollack, MD;  Location: [facility] OR;  Service: Open Heart Surgery;  Laterality: N/A;   LEFT  AND RIGHT HEART CATHETERIZATION WITH CORONARY ANGIOGRAM N/A 12/25/2012   Procedure: LEFT AND RIGHT HEART CATHETERIZATION WITH CORONARY ANGIOGRAM;  Surgeon: Jolaine Artist, MD;  Location: [facility] CATH LAB;  Service: Cardiovascular;  Laterality: N/A;   PERMANENT PACEMAKER INSERTION N/A 02/19/2013   Procedure: PERMANENT PACEMAKER INSERTION;  Surgeon: Deboraha Sprang, MD;  Location: [facility] CATH LAB;  Service: Cardiovascular;  Laterality: N/A;   RECTAL BIOPSY N/A 10/08/2017   Procedure: EXCISION OF ANAL POLYP ;  Surgeon: Ileana Roup, MD;  [facility];  Service: General;  Laterality: N/A;   stapendectomy  07/30/02   right   XI ROBOT ASSISTED RECTOPEXY N/A 02/09/2021   Procedure: XI ROBOT ASSISTED SUTURE  RECTOPEXY;  Surgeon: Ileana Roup, MD;  Location[facility];  Service: General;  Laterality: N/A;   XRT and surgical resection     s/p. 10 years ago    There were no vitals filed for this visit.   Subjective Assessment - 06/16/21 1135     Subjective I was able to work 9 hours this week. My legs were sore but everything was manageable    Patient Stated Goals reduce or eliminate leakage of stool    Currently in Pain? No/denies  [place] Adult PT Treatment/Exercise - 06/16/21 0001       Lumbar Exercises: Aerobic   UBE (Upper Arm Bike) 4 min back - PT present for status update      Lumbar Exercises: Standing   Shoulder Extension Strengthening;Both;20 reps;Theraband    Theraband Level (Shoulder Extension) Level 4 (Blue)    Other Standing Lumbar Exercises pully elbow ext with kegel - 15# - 30x    Other Standing Lumbar Exercises one foot on stool bicep curl 3 lb                          PT Long Term Goals - 06/16/21 1123       PT LONG TERM GOAL #1   Title Pt will be ind with HEP to return to maximum function at work    Baseline better at work and did a 9 and 6 hour shift last week    Status Achieved      PT LONG  TERM GOAL #2   Title Pt will be able to stand up for 3-4 hours in order to complete at least half shift at work without interruptions from fecal leakage    Baseline working 6-9 hours without interruption    Status Achieved      PT LONG TERM GOAL #3   Title Pt will have at [place] 50% improved sensation of when he has to have a BM.    Baseline can tell when I am going to have a BM all the time now    Status Achieved                   Plan - 06/16/21 1132     Clinical Impression Statement Pt was able to progress exercises today and has met all goals  Pt will d/c with HEP today.    PT Treatment/Interventions ADLs/Self Care Home Management;Biofeedback;Cryotherapy;Electrical Stimulation;Moist Heat;Therapeutic activities;Therapeutic exercise;Neuromuscular re-education;Patient/family education;Manual techniques;Taping;Dry needling;Passive range of motion    PT Next Visit Plan d/c today    PT Home Exercise Plan Access Code: [email or web address]    Consulted and Agree with Plan of Care Patient             Patient will benefit from skilled therapeutic intervention in order to improve the following deficits and impairments:  Abnormal gait, Decreased strength, Postural dysfunction, Impaired flexibility, Increased fascial restricitons, Decreased coordination, Decreased range of motion, Increased muscle spasms, Decreased endurance  Visit Diagnosis: Muscle weakness (generalized)  Abnormal posture     Problem List Patient Active Problem List   Diagnosis Date Noted   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   Macular pucker, right eye 11/30/2020   Cystoid macular edema, right eye 11/30/2020   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   Vitreomacular adhesion, left 11/30/2020   Lower extremity edema 12/04/2013   Pacemaker -[employer]tr[employer]    S/P AVR (aortic valve replacement) 02/21/2013   Complete heart block ([place]) 02/19/2013   Chronic diastolic heart failure ([place]) 01/30/2013   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/26/2012    Groin hematoma 12/26/2012   Aortic stenosis    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   LEUKEMIA, 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   HYPERLIPIDEMIA 12/04/2008   DEPRESSION 12/04/2008   ASTHMA, CHILDHOOD 12/04/2008   ASTHMA 12/04/2008    Camillo Flaming Abdulrahman Bracey, PT 06/16/2021, 11:47 AM  [facility] @ [place], [place], [address] Phone: [phone]   Fax:  [phone]  Name: Jori Moll  OSUALDO HANSELL MRN: [id number] Date of Birth: 1941/08/14   PHYSICAL THERAPY DISCHARGE SUMMARY  Visits from Start of Care: 9  Current functional level related to goals / functional outcomes: See above goals   Remaining deficits: See above   Education / Equipment: HEP   Patient agrees to discharge. Patient goals were met. Patient is being discharged due to meeting the stated rehab goals.  Gustavus Bryant, PT 06/16/21 3:22 PM

## 2021-06-17 ENCOUNTER — Ambulatory Visit (INDEPENDENT_AMBULATORY_CARE_PROVIDER_SITE_OTHER): Payer: Medicare Other

## 2021-06-17 DIAGNOSIS — I442 Atrioventricular block, complete: Secondary | ICD-10-CM

## 2021-06-19 LAB — CUP PACEART REMOTE DEVICE CHECK
Battery Impedance: 1154 Ohm
Battery Remaining Longevity: 60 mo
Battery Voltage: 2.78 V
Brady Statistic AP VP Percent: 46 %
Brady Statistic AP VS Percent: 0 %
Brady Statistic AS VP Percent: 54 %
Brady Statistic AS VS Percent: 0 %
Date Time Interrogation Session: 20221021092250
Implantable Lead Implant Date: 20140625
Implantable Lead Implant Date: 20140625
Implantable Lead Location: 753859
Implantable Lead Location: 753860
Implantable Lead Model: 5076
Implantable Lead Model: 5076
Implantable Pulse Generator Implant Date: 20140625
Lead Channel Impedance Value: 474 Ohm
Lead Channel Impedance Value: 493 Ohm
Lead Channel Pacing Threshold Amplitude: 0.5 V
Lead Channel Pacing Threshold Amplitude: 0.625 V
Lead Channel Pacing Threshold Pulse Width: 0.4 ms
Lead Channel Pacing Threshold Pulse Width: 0.4 ms
Lead Channel Setting Pacing Amplitude: 1.5 V
Lead Channel Setting Pacing Amplitude: 2 V
Lead Channel Setting Pacing Pulse Width: 0.4 ms
Lead Channel Setting Sensing Sensitivity: 2 mV

## 2021-06-24 NOTE — Progress Notes (Signed)
Remote pacemaker transmission.   

## 2021-07-07 DIAGNOSIS — L570 Actinic keratosis: Secondary | ICD-10-CM | POA: Diagnosis not present

## 2021-07-07 DIAGNOSIS — L821 Other seborrheic keratosis: Secondary | ICD-10-CM | POA: Diagnosis not present

## 2021-07-07 DIAGNOSIS — D2261 Melanocytic nevi of right upper limb, including shoulder: Secondary | ICD-10-CM | POA: Diagnosis not present

## 2021-07-07 DIAGNOSIS — D2271 Melanocytic nevi of right lower limb, including hip: Secondary | ICD-10-CM | POA: Diagnosis not present

## 2021-07-07 DIAGNOSIS — Z85828 Personal history of other malignant neoplasm of skin: Secondary | ICD-10-CM | POA: Diagnosis not present

## 2021-07-07 DIAGNOSIS — D225 Melanocytic nevi of trunk: Secondary | ICD-10-CM | POA: Diagnosis not present

## 2021-07-07 DIAGNOSIS — D2272 Melanocytic nevi of left lower limb, including hip: Secondary | ICD-10-CM | POA: Diagnosis not present

## 2021-07-07 DIAGNOSIS — D1801 Hemangioma of skin and subcutaneous tissue: Secondary | ICD-10-CM | POA: Diagnosis not present

## 2021-07-18 ENCOUNTER — Other Ambulatory Visit: Payer: Self-pay | Admitting: Internal Medicine

## 2021-07-18 DIAGNOSIS — C7A8 Other malignant neuroendocrine tumors: Secondary | ICD-10-CM

## 2021-08-03 ENCOUNTER — Encounter (INDEPENDENT_AMBULATORY_CARE_PROVIDER_SITE_OTHER): Payer: Self-pay | Admitting: Ophthalmology

## 2021-08-03 ENCOUNTER — Ambulatory Visit (INDEPENDENT_AMBULATORY_CARE_PROVIDER_SITE_OTHER): Payer: Medicare Other | Admitting: Ophthalmology

## 2021-08-03 ENCOUNTER — Other Ambulatory Visit: Payer: Self-pay

## 2021-08-03 DIAGNOSIS — R0683 Snoring: Secondary | ICD-10-CM

## 2021-08-03 DIAGNOSIS — H35371 Puckering of macula, right eye: Secondary | ICD-10-CM | POA: Diagnosis not present

## 2021-08-03 DIAGNOSIS — H35351 Cystoid macular degeneration, right eye: Secondary | ICD-10-CM | POA: Diagnosis not present

## 2021-08-03 MED ORDER — TRIAMCINOLONE ACETONIDE 40 MG/ML IO SUSP
40.0000 mg | INTRAOCULAR | Status: AC | PRN
  Administered 2021-08-03: 40 mg via INTRAVITREAL

## 2021-08-03 NOTE — Assessment & Plan Note (Signed)
May consider sleep study in the future if not ocular condition does not improve with medical therapy

## 2021-08-03 NOTE — Progress Notes (Signed)
08/03/2021     CHIEF COMPLAINT Patient presents for  Chief Complaint  Patient presents with   Post-op Follow-up      HISTORY OF PRESENT ILLNESS: [name] is a 80 y.o. male who presents to the clinic today for:   HPI     Post-op Follow-up           Laterality: right eye   Vision: is blurred at distance and is blurred at near         Comments   8 week fu OD oct. Pt states when driving at night he notices a change. He states with one eye closed, he sees one car, but with both eyes open he sees the glare that looks like there are two cars- is noticing double vision. Pt states he notices this only when driving at night. Pt is currently using Ketorolac QID OD.      Last edited by Laurin Coder on 08/03/2021 10:26 AM.      Referring physician: Jonathon Jordan, MD [address],  [place] [address]  HISTORICAL INFORMATION:   Selected notes from the MEDICAL RECORD NUMBER    Lab Results  Component Value Date   HGBA1C 5.9 (H) 02/09/2021     CURRENT MEDICATIONS: Current Outpatient Medications (Ophthalmic Drugs)  Medication Sig   cycloSPORINE (RESTASIS) 0.05 % ophthalmic emulsion Place 1 drop into both eyes 2 (two) times daily.   ketorolac (ACULAR) 0.5 % ophthalmic solution Place 1 drop into the right eye 4 (four) times daily.   ketorolac (ACULAR) 0.5 % ophthalmic solution Place 1 drop into the right eye 4 (four) times daily.   Polyethyl Glycol-Propyl Glycol (SYSTANE OP) Place 1 drop into both eyes 2 (two) times daily.   No current facility-administered medications for this visit. (Ophthalmic Drugs)   Current Outpatient Medications (Other)  Medication Sig   acetaminophen (TYLENOL) 500 MG tablet Take 500 mg by mouth 3 (three) times daily.   aspirin 81 MG tablet Take 1 tablet (81 mg total) by mouth daily.   busPIRone (BUSPAR) 15 MG tablet Take 15-30 mg by mouth See admin instructions. Take 15 mg by mouth every morning and 30 mg  every day at noon   Cholecalciferol (VITAMIN D) 2000 units tablet Take 2,000 Units by mouth every other day.    fenofibrate 160 MG tablet Take 160 mg by mouth daily.   fluticasone (FLONASE) 50 MCG/ACT nasal spray Place 2 sprays into the nose daily.   FORA V12 BLOOD GLUCOSE TEST test strip    furosemide (LASIX) 40 MG tablet TAKE ONE TABLET TWICE DAILY   gabapentin (NEURONTIN) 300 MG capsule Take 300-600 mg by mouth See admin instructions. Take 300 mg by mouth each morning and 600 mg each afternoon.   LITETOUCH LANCETS MISC    montelukast (SINGULAIR) 10 MG tablet Take 10 mg by mouth daily.   Multiple Vitamin (MULTIVITAMIN WITH MINERALS) TABS tablet Take 1 tablet by mouth daily.   polyethylene glycol (MIRALAX / GLYCOLAX) 17 g packet Take 17 g by mouth daily.   potassium chloride SA (K-DUR,KLOR-CON) 20 MEQ tablet TAKE ONE TABLET EVERY DAY   Probiotic Product (PHILLIPS COLON HEALTH PO) Take 1 capsule by mouth daily.   sertraline (ZOLOFT) 100 MG tablet Take 100 mg by mouth daily.   traZODone (DESYREL) 50 MG tablet Take 150 mg by mouth at bedtime.   No current facility-administered medications for this visit. (Other)      REVIEW OF SYSTEMS:  ALLERGIES Allergies  Allergen Reactions   Banana Anaphylaxis   Penicillins Rash    Has patient had a PCN reaction causing immediate rash, facial/tongue/throat swelling, SOB or lightheadedness with hypotension: Unknown Has patient had a PCN reaction causing severe rash involving mucus membranes or skin necrosis: Unknown Has patient had a PCN reaction that required hospitalization: No Has patient had a PCN reaction occurring within the last 10 years: No If all of the above answers are "NO", then may proceed with Cephalosporin use.     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 Ouida Sills   Depression    Difficult intubation 02/09/2021   Anterior Airway.   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   Past Surgical History:  Procedure Laterality Date   AORTIC VALVE REPLACEMENT N/A 02/17/2013   Procedure: AORTIC VALVE REPLACEMENT (AVR);  Surgeon: Gaye Pollack, MD;  Location: [place];  Service: Open Heart Surgery;  Laterality: N/A;   CYST REMOVAL NECK     EYE SURGERY     INNER EAR SURGERY     INTRAOPERATIVE TRANSESOPHAGEAL ECHOCARDIOGRAM N/A 02/17/2013   Procedure: INTRAOPERATIVE TRANSESOPHAGEAL ECHOCARDIOGRAM;  Surgeon: Gaye Pollack, MD;  [facility];  Service: Open Heart Surgery;  Laterality: N/A;   LEFT AND RIGHT HEART CATHETERIZATION WITH CORONARY ANGIOGRAM N/A 12/25/2012   Procedure: LEFT AND RIGHT HEART CATHETERIZATION WITH CORONARY ANGIOGRAM;  Surgeon: Jolaine Artist, MD;  [facility] LAB;  Service: Cardiovascular;  Laterality: N/A;   PERMANENT PACEMAKER INSERTION N/A 02/19/2013   Procedure: PERMANENT PACEMAKER INSERTION;  Surgeon: Deboraha Sprang, MD;  Location: [facility] CATH LAB;  Service: Cardiovascular;  Laterality: N/A;   RECTAL BIOPSY N/A 10/08/2017   Procedure: EXCISION OF ANAL POLYP ;  Surgeon: Ileana Roup, MD;  Location: [facility] ORS;  Service: General;  Laterality: N/A;   stapendectomy  07/30/02   right   XI ROBOT ASSISTED RECTOPEXY N/A 02/09/2021   Procedure: XI ROBOT ASSISTED SUTURE  RECTOPEXY;  Surgeon: Ileana Roup, MD;  Location: [facility] ORS;  Service: General;  Laterality: N/A;   XRT and surgical resection     s/p. 10 years ago    FAMILY  HISTORY Family History  Problem Relation Age of Onset   Brain cancer Mother    Cancer Father        lung    SOCIAL HISTORY Social History   Tobacco Use   Smoking status: Never   Smokeless tobacco: Never  Vaping Use   Vaping Use: Never used  Substance Use Topics   Alcohol use: No   Drug use: No         OPHTHALMIC EXAM:  Base Eye Exam     Visual Acuity (ETDRS)       Right Left  Dist cc 20/60 -1 20/25 -1   Dist ph cc 20/50 -1+2          Tonometry (Tonopen, 10:31 AM)       Right Left   Pressure 9 8         Pupils       Pupils Dark Light APD   Right PERRL 4.5 3.5 None   Left PERRL 4 3 None         Extraocular Movement       Right Left    Full Full         Neuro/Psych     Oriented x3: Yes   Mood/Affect: Normal         Dilation     Both eyes: 1.0% Mydriacyl, 2.5% Phenylephrine @ 10:31 AM           Slit Lamp and Fundus Exam     External Exam       Right Left   External Normal Normal         Slit Lamp Exam       Right Left   Lids/Lashes Normal Normal   Conjunctiva/Sclera White and quiet White and quiet   Cornea Clear Clear   Anterior Chamber Deep and quiet Deep and quiet   Iris Round and reactive Round and reactive   Lens Centered posterior chamber intraocular lens Centered posterior chamber intraocular lens   Anterior Vitreous Clear avitric Normal         Fundus Exam       Right Left   Posterior Vitreous Clear avitric    Disc Peripapillary atrophy    C/D Ratio 0.4    Macula No topographic distortion remains post ILM removal changes present    Vessels Normal    Periphery Normal             IMAGING AND PROCEDURES  Imaging and Procedures for 08/03/21  OCT, Retina - OU - Both Eyes       Right Eye Quality was good. Scan locations included subfoveal. Central Foveal Thickness: 406. Progression has no prior data. Findings include abnormal foveal contour, cystoid macular edema, epiretinal membrane.   Left  Eye Quality was good. Scan locations included subfoveal. Central Foveal Thickness: 295. Progression has no prior data. Findings include vitreomacular adhesion , normal foveal contour.   Notes CME Minor OD post vitrectomy ILM peel for epiretinal membrane and asteroid hyalosis.  Overall improved OS with incidental VMA, no topographic distortions     Injection into Tenon's Capsule - OD - Right Eye       Time Out 08/03/2021. 12:00 PM. Confirmed correct patient, procedure, site, and patient consented.   Anesthesia No anesthesia was used.   Procedure A 27 gauge needle was used.   Injection: 40 mg triamcinolone acetonide 40 MG/ML   Route: Intravitreal, Site: Right Eye   NDC: [phone], Waste: 0 mL   Post-op Post injection exam found visual acuity of at least counting fingers. The patient tolerated the procedure well. There were no complications. The patient received written and verbal post procedure care education.   Notes Retroseptal injection hence subtenon's, outer one third lower lid             ASSESSMENT/PLAN:  Cystoid macular edema, right eye Persistent residual CME OD, despite use of topical NSAID 4 times daily.  Will consider use of periocular steroids to resolve this.  If this does not resolve this patient may in fact have underlying MAC-TEL as this was  present at his initial evaluation, and may not thus need fluorescein angiography OD and if MAC-TEL present OS, may need official sleep study as the patient does snore  Snores May consider sleep study in the future if not ocular condition does not improve with medical therapy     ICD-10-CM   1. Cystoid macular edema, right eye  H35.351 Injection into Tenon's Capsule - OD - Right Eye    triamcinolone acetonide (TRIESENCE) 40 MG/ML subtenons injection 40 mg    2. Macular pucker, right eye  H35.371 OCT, Retina - OU - Both Eyes    3. Snores  R06.83       1.  No improvement in CME but no worsening after 6 to  8 weeks of topical NSAIDs.  We will today add retroseptal Kenalog 40 mg delivered safely, and follow-up in 6 weeks to monitor response  2.  3.  Ophthalmic Meds Ordered this visit:  Meds ordered this encounter  Medications   triamcinolone acetonide (TRIESENCE) 40 MG/ML subtenons injection 40 mg       Return in about 6 weeks (around 09/14/2021) for dilate, OD, OCT.  There are no Patient Instructions on file for this visit.   Explained the diagnoses, plan, and follow up with the patient and they expressed understanding.  Patient expressed understanding of the importance of proper follow up care.   Clent Demark Hillery Bhalla M.D. Diseases & Surgery of the Retina and Vitreous Retina & Diabetic [place] 08/03/21     Abbreviations: M myopia (nearsighted); A astigmatism; H hyperopia (farsighted); P presbyopia; Mrx spectacle prescription;  CTL contact lenses; OD right eye; OS left eye; OU both eyes  XT exotropia; ET esotropia; PEK punctate epithelial keratitis; PEE punctate epithelial erosions; DES dry eye syndrome; MGD meibomian gland dysfunction; ATs artificial tears; PFAT's preservative free artificial tears; [place] nuclear sclerotic cataract; PSC posterior subcapsular cataract; ERM epi-retinal membrane; PVD posterior vitreous detachment; RD retinal detachment; DM diabetes mellitus; DR diabetic retinopathy; NPDR non-proliferative diabetic retinopathy; PDR proliferative diabetic retinopathy; CSME clinically significant macular edema; DME diabetic macular edema; dbh dot blot hemorrhages; CWS cotton wool spot; POAG primary open angle glaucoma; C/D cup-to-disc ratio; HVF humphrey visual field; GVF goldmann visual field; OCT optical coherence tomography; IOP intraocular pressure; BRVO Branch retinal vein occlusion; CRVO central retinal vein occlusion; CRAO central retinal artery occlusion; BRAO branch retinal artery occlusion; RT retinal tear; SB scleral buckle; PPV pars plana vitrectomy; VH Vitreous  hemorrhage; PRP panretinal laser photocoagulation; IVK intravitreal kenalog; VMT vitreomacular traction; MH Macular hole;  NVD neovascularization of the disc; NVE neovascularization elsewhere; AREDS age related eye disease study; ARMD age related macular degeneration; POAG primary open angle glaucoma; EBMD epithelial/anterior basement membrane dystrophy; ACIOL anterior chamber intraocular lens; IOL intraocular lens; PCIOL posterior chamber intraocular lens; Phaco/IOL phacoemulsification with intraocular lens placement; [place] photorefractive keratectomy; LASIK laser assisted in situ keratomileusis; HTN hypertension; DM diabetes mellitus; COPD chronic obstructive pulmonary disease

## 2021-08-03 NOTE — Patient Instructions (Signed)
Patient instructed to continue on topical NSAID, ketorolac 1 drop right eye 4 times daily

## 2021-08-03 NOTE — Assessment & Plan Note (Signed)
Persistent residual CME OD, despite use of topical NSAID 4 times daily.  Will consider use of periocular steroids to resolve this.  If this does not resolve this patient may in fact have underlying MAC-TEL as this was present at his initial evaluation, and may not thus need fluorescein angiography OD and if MAC-TEL present OS, may need official sleep study as the patient does snore

## 2021-08-04 ENCOUNTER — Ambulatory Visit
Admission: RE | Admit: 2021-08-04 | Discharge: 2021-08-04 | Disposition: A | Discharge status: Home / Self Care | Payer: Medicare Other | Source: Ambulatory Visit | Attending: Internal Medicine | Admitting: Internal Medicine

## 2021-08-04 DIAGNOSIS — C7A8 Other malignant neuroendocrine tumors: Secondary | ICD-10-CM

## 2021-08-04 DIAGNOSIS — K7689 Other specified diseases of liver: Secondary | ICD-10-CM | POA: Diagnosis not present

## 2021-08-04 DIAGNOSIS — F325 Major depressive disorder, single episode, in full remission: Secondary | ICD-10-CM | POA: Diagnosis not present

## 2021-08-04 DIAGNOSIS — Z23 Encounter for immunization: Secondary | ICD-10-CM | POA: Diagnosis not present

## 2021-08-04 DIAGNOSIS — N281 Cyst of kidney, acquired: Secondary | ICD-10-CM | POA: Diagnosis not present

## 2021-08-04 DIAGNOSIS — N200 Calculus of kidney: Secondary | ICD-10-CM | POA: Diagnosis not present

## 2021-08-04 DIAGNOSIS — E1122 Type 2 diabetes mellitus with diabetic chronic kidney disease: Secondary | ICD-10-CM | POA: Diagnosis not present

## 2021-08-04 MED ORDER — IOPAMIDOL (ISOVUE-300) INJECTION 61%
100.0000 mL | Freq: Once | INTRAVENOUS | Status: AC | PRN
  Administered 2021-08-04: 100 mL via INTRAVENOUS

## 2021-08-09 DIAGNOSIS — C7B8 Other secondary neuroendocrine tumors: Secondary | ICD-10-CM | POA: Diagnosis not present

## 2021-08-09 DIAGNOSIS — D3A8 Other benign neuroendocrine tumors: Secondary | ICD-10-CM | POA: Diagnosis not present

## 2021-08-09 DIAGNOSIS — C7A8 Other malignant neuroendocrine tumors: Secondary | ICD-10-CM | POA: Diagnosis not present

## 2021-09-15 LAB — CUP PACEART REMOTE DEVICE CHECK
Battery Impedance: 1208 Ohm
Battery Remaining Longevity: 57 mo
Battery Voltage: 2.78 V
Brady Statistic AP VP Percent: 44 %
Brady Statistic AP VS Percent: 0 %
Brady Statistic AS VP Percent: 56 %
Brady Statistic AS VS Percent: 0 %
Date Time Interrogation Session: 20230119090936
Implantable Lead Implant Date: 20140625
Implantable Lead Implant Date: 20140625
Implantable Lead Location: 753859
Implantable Lead Location: 753860
Implantable Lead Model: 5076
Implantable Lead Model: 5076
Implantable Pulse Generator Implant Date: 20140625
Lead Channel Impedance Value: 456 Ohm
Lead Channel Impedance Value: 471 Ohm
Lead Channel Pacing Threshold Amplitude: 0.5 V
Lead Channel Pacing Threshold Amplitude: 0.625 V
Lead Channel Pacing Threshold Pulse Width: 0.4 ms
Lead Channel Pacing Threshold Pulse Width: 0.4 ms
Lead Channel Setting Pacing Amplitude: 1.5 V
Lead Channel Setting Pacing Amplitude: 2 V
Lead Channel Setting Pacing Pulse Width: 0.4 ms
Lead Channel Setting Sensing Sensitivity: 2 mV

## 2021-09-16 ENCOUNTER — Ambulatory Visit (INDEPENDENT_AMBULATORY_CARE_PROVIDER_SITE_OTHER): Payer: Medicare Other

## 2021-09-16 DIAGNOSIS — I442 Atrioventricular block, complete: Secondary | ICD-10-CM | POA: Diagnosis not present

## 2021-09-20 ENCOUNTER — Ambulatory Visit (INDEPENDENT_AMBULATORY_CARE_PROVIDER_SITE_OTHER): Payer: Medicare Other | Admitting: Ophthalmology

## 2021-09-20 ENCOUNTER — Other Ambulatory Visit: Payer: Self-pay

## 2021-09-20 ENCOUNTER — Encounter (INDEPENDENT_AMBULATORY_CARE_PROVIDER_SITE_OTHER): Payer: Self-pay | Admitting: Ophthalmology

## 2021-09-20 DIAGNOSIS — H35351 Cystoid macular degeneration, right eye: Secondary | ICD-10-CM

## 2021-09-20 DIAGNOSIS — H35371 Puckering of macula, right eye: Secondary | ICD-10-CM | POA: Diagnosis not present

## 2021-09-20 DIAGNOSIS — H4321 Crystalline deposits in vitreous body, right eye: Secondary | ICD-10-CM | POA: Diagnosis not present

## 2021-09-20 NOTE — Progress Notes (Signed)
09/20/2021     CHIEF COMPLAINT Patient presents for  Chief Complaint  Patient presents with   Retina Evaluation      HISTORY OF PRESENT ILLNESS: [name] is a 81 y.o. male who presents to the clinic today for:   HPI     Retina Evaluation           MD Performed: performed the HPI with the patient and updated documentation appropriately       Last edited by Hurman Horn, MD on 09/20/2021  9:33 AM.      Referring physician: Jonathon Jordan, MD [address],  [place] [address]  HISTORICAL INFORMATION:   Selected notes from the [facility]    Lab Results  Component Value Date   HGBA1C 5.9 (H) 02/09/2021     CURRENT MEDICATIONS: Current Outpatient Medications (Ophthalmic Drugs)  Medication Sig   cycloSPORINE (RESTASIS) 0.05 % ophthalmic emulsion Place 1 drop into both eyes 2 (two) times daily.   ketorolac (ACULAR) 0.5 % ophthalmic solution Place 1 drop into the right eye 4 (four) times daily.   ketorolac (ACULAR) 0.5 % ophthalmic solution Place 1 drop into the right eye 4 (four) times daily.   Polyethyl Glycol-Propyl Glycol (SYSTANE OP) Place 1 drop into both eyes 2 (two) times daily.   No current facility-administered medications for this visit. (Ophthalmic Drugs)   Current Outpatient Medications (Other)  Medication Sig   acetaminophen (TYLENOL) 500 MG tablet Take 500 mg by mouth 3 (three) times daily.   aspirin 81 MG tablet Take 1 tablet (81 mg total) by mouth daily.   busPIRone (BUSPAR) 15 MG tablet Take 15-30 mg by mouth See admin instructions. Take 15 mg by mouth every morning and 30 mg every day at noon   Cholecalciferol (VITAMIN D) 2000 units tablet Take 2,000 Units by mouth every other day.    fenofibrate 160 MG tablet Take 160 mg by mouth daily.   fluticasone (FLONASE) 50 MCG/ACT nasal spray Place 2 sprays into the nose daily.   FORA V12 BLOOD GLUCOSE TEST test strip    furosemide (LASIX) 40 MG tablet TAKE  ONE TABLET TWICE DAILY   gabapentin (NEURONTIN) 300 MG capsule Take 300-600 mg by mouth See admin instructions. Take 300 mg by mouth each morning and 600 mg each afternoon.   LITETOUCH LANCETS MISC    montelukast (SINGULAIR) 10 MG tablet Take 10 mg by mouth daily.   Multiple Vitamin (MULTIVITAMIN WITH MINERALS) TABS tablet Take 1 tablet by mouth daily.   polyethylene glycol (MIRALAX / GLYCOLAX) 17 g packet Take 17 g by mouth daily.   potassium chloride SA (K-DUR,KLOR-CON) 20 MEQ tablet TAKE ONE TABLET EVERY DAY   Probiotic Product (PHILLIPS COLON HEALTH PO) Take 1 capsule by mouth daily.   sertraline (ZOLOFT) 100 MG tablet Take 100 mg by mouth daily.   traZODone (DESYREL) 50 MG tablet Take 150 mg by mouth at bedtime.   No current facility-administered medications for this visit. (Other)      REVIEW OF SYSTEMS:    ALLERGIES Allergies  Allergen Reactions   Banana Anaphylaxis   Penicillins Rash    Has patient had a PCN reaction causing immediate rash, facial/tongue/throat swelling, SOB or lightheadedness with hypotension: Unknown Has patient had a PCN reaction causing severe rash involving mucus membranes or skin necrosis: Unknown Has patient had a PCN reaction that required hospitalization: No Has patient had a PCN reaction occurring within the last 10  years: No If all of the above answers are "NO", then may proceed with Cephalosporin use.     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 s/p MDT dual chamber PPM 2014   Cataract 09/2020   bilateral   CHF (congestive heart failure) ([place])    Chronic lymphocytic leukemia ([place])    followed by Dr. Ouida Sills   Depression    Difficult intubation 02/09/2021   Anterior Airway.    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   Past Surgical History:  Procedure Laterality Date   AORTIC VALVE REPLACEMENT N/A 02/17/2013   Procedure: AORTIC VALVE REPLACEMENT (AVR);  Surgeon: Gaye Pollack, MD;  Location: [place];  Service: Open Heart Surgery;  Laterality: N/A;   CYST REMOVAL NECK     EYE SURGERY     INNER EAR SURGERY     INTRAOPERATIVE TRANSESOPHAGEAL ECHOCARDIOGRAM N/A 02/17/2013   Procedure: INTRAOPERATIVE TRANSESOPHAGEAL ECHOCARDIOGRAM;  Surgeon: Gaye Pollack, MD;  Location: [facility] OR;  Service: Open Heart Surgery;  Laterality: N/A;   LEFT AND RIGHT HEART CATHETERIZATION WITH CORONARY ANGIOGRAM N/A 12/25/2012   Procedure: LEFT AND RIGHT HEART CATHETERIZATION WITH CORONARY ANGIOGRAM;  Surgeon: Jolaine Artist, MD;  Location: [facility] CATH LAB;  Service: Cardiovascular;  Laterality: N/A;   PERMANENT PACEMAKER INSERTION N/A 02/19/2013   Procedure: PERMANENT PACEMAKER INSERTION;  Surgeon: Deboraha Sprang, MD;  Location[facility] CATH LAB;  Service: Cardiovascular;  Laterality: N/A;   RECTAL BIOPSY N/A 10/08/2017   Procedure: EXCISION OF ANAL POLYP ;  Surgeon: Ileana Roup, MD;  [facility];  Service: General;  Laterality: N/A;   stapendectomy  07/30/02   right   XI ROBOT ASSISTED RECTOPEXY N/A 02/09/2021   Procedure: XI ROBOT ASSISTED SUTURE  RECTOPEXY;  Surgeon: Ileana Roup, MD;  [facility];  Service: General;  Laterality: N/A;   XRT and surgical resection     s/p. 10 years ago    FAMILY HISTORY Family History  Problem Relation Age of Onset   Brain cancer Mother    Cancer Father        lung    SOCIAL HISTORY Social History   Tobacco Use   Smoking status: Never   Smokeless tobacco: Never  Vaping Use   Vaping Use: Never used   Substance Use Topics   Alcohol use: No   Drug use: No         OPHTHALMIC EXAM:  Base Eye Exam     Visual Acuity (ETDRS)       Right Left   Dist cc 20/40 -2 20/20   Dist ph cc 20/30 -1          Tonometry (Tonopen, 9:37 AM)       Right Left   Pressure 11 10         Pupils       Pupils APD  Right PERRL None   Left PERRL          Visual Fields       Left Right    Full Full         Extraocular Movement       Right Left    Full, Ortho Full, Ortho         Neuro/Psych     Oriented x3: Yes   Mood/Affect: Normal         Dilation     Both eyes: 1.0% Mydriacyl, 2.5% Phenylephrine @ 9:36 AM           Slit Lamp and Fundus Exam     External Exam       Right Left   External Normal Normal         Slit Lamp Exam       Right Left   Lids/Lashes Normal Normal   Conjunctiva/Sclera White and quiet White and quiet   Cornea Clear Clear   Anterior Chamber Deep and quiet Deep and quiet   Iris Round and reactive Round and reactive   Lens Centered posterior chamber intraocular lens Centered posterior chamber intraocular lens   Anterior Vitreous Clear avitric Normal         Fundus Exam       Right Left   Posterior Vitreous Clear avitric    Disc Peripapillary atrophy    C/D Ratio 0.4    Macula No topographic distortion remains post ILM removal changes present    Vessels Normal    Periphery Normal             IMAGING AND PROCEDURES  Imaging and Procedures for 09/20/21  OCT, Retina - OU - Both Eyes       Right Eye Quality was good. Scan locations included subfoveal. Central Foveal Thickness: 363. Progression has no prior data. Findings include abnormal foveal contour.   Left Eye Quality was good. Scan locations included subfoveal. Central Foveal Thickness: 297. Progression has no prior data. Findings include vitreomacular adhesion , normal foveal contour.   Notes CME Minor and not definable, OD post vitrectomy ILM peel for  epiretinal membrane and asteroid hyalosis.  Overall improved OS with incidental VMA, no topographic distortions             ASSESSMENT/PLAN:  Cystoid macular edema, right eye Patient continues on topical NSAIDs roughly 3-4 times daily currently, and improved macular thickening today as well as visually on the eye chart.  Will decrease the use of topical NSAID to 3 times daily right eye and monitor again in 3 months  Macular pucker, right eye Macular anatomy continues to improve post vitrectomy membrane peel  Asteroid hyalosis of right eye Condition resolved from vitrectomy     ICD-10-CM   1. Cystoid macular edema, right eye  H35.351 OCT, Retina - OU - Both Eyes    2. Macular pucker, right eye  H35.371 OCT, Retina - OU - Both Eyes    3. Asteroid hyalosis of right eye  H43.21       1.  OD looks great, improved acuity and improved macular anatomy by OCT and clinical findings.  We will continue on topical NSAIDs only 3 times daily to maximize potential benefit and maintain current findings and in 3 months we will reassess to determine whether we can taper further topical NSAID use  2.  3.  Ophthalmic Meds Ordered this visit:  No orders of the defined types were placed in this encounter.  Return in about 3 months (around 12/19/2021) for dilate, OD, AVASTIN OCT.  There are no Patient Instructions on file for this visit.   Explained the diagnoses, plan, and follow up with the patient and they expressed understanding.  Patient expressed understanding of the importance of proper follow up care.   [name] M.D. Diseases & Surgery of the Retina and Vitreous Retina & Diabetic [place] 09/20/21     Abbreviations: M myopia (nearsighted); A astigmatism; H hyperopia (farsighted); P presbyopia; Mrx spectacle prescription;  CTL contact lenses; OD right eye; OS left eye; OU both eyes  XT exotropia; ET esotropia; PEK punctate epithelial keratitis; PEE punctate  epithelial erosions; DES dry eye syndrome; MGD meibomian gland dysfunction; ATs artificial tears; PFAT's preservative free artificial tears; [place] nuclear sclerotic cataract; PSC posterior subcapsular cataract; ERM epi-retinal membrane; PVD posterior vitreous detachment; RD retinal detachment; DM diabetes mellitus; DR diabetic retinopathy; NPDR non-proliferative diabetic retinopathy; PDR proliferative diabetic retinopathy; CSME clinically significant macular edema; DME diabetic macular edema; dbh dot blot hemorrhages; CWS cotton wool spot; POAG primary open angle glaucoma; C/D cup-to-disc ratio; HVF humphrey visual field; GVF goldmann visual field; OCT optical coherence tomography; IOP intraocular pressure; BRVO Branch retinal vein occlusion; CRVO central retinal vein occlusion; CRAO central retinal artery occlusion; BRAO branch retinal artery occlusion; RT retinal tear; SB scleral buckle; PPV pars plana vitrectomy; VH Vitreous hemorrhage; PRP panretinal laser photocoagulation; IVK intravitreal kenalog; VMT vitreomacular traction; MH Macular hole;  NVD neovascularization of the disc; NVE neovascularization elsewhere; AREDS age related eye disease study; ARMD age related macular degeneration; POAG primary open angle glaucoma; EBMD epithelial/anterior basement membrane dystrophy; ACIOL anterior chamber intraocular lens; IOL intraocular lens; PCIOL posterior chamber intraocular lens; Phaco/IOL phacoemulsification with intraocular lens placement; [place] photorefractive keratectomy; LASIK laser assisted in situ keratomileusis; HTN hypertension; DM diabetes mellitus; COPD chronic obstructive pulmonary disease

## 2021-09-20 NOTE — Assessment & Plan Note (Addendum)
Macular anatomy continues to improve post vitrectomy membrane peel

## 2021-09-20 NOTE — Assessment & Plan Note (Addendum)
Patient continues on topical NSAIDs roughly 3-4 times daily currently, and improved macular thickening today as well as visually on the eye chart.  Will decrease the use of topical NSAID to 3 times daily right eye and monitor again in 3 months

## 2021-09-20 NOTE — Assessment & Plan Note (Signed)
Condition resolved from vitrectomy

## 2021-09-27 DIAGNOSIS — E782 Mixed hyperlipidemia: Secondary | ICD-10-CM | POA: Diagnosis not present

## 2021-09-27 DIAGNOSIS — N1831 Chronic kidney disease, stage 3a: Secondary | ICD-10-CM | POA: Diagnosis not present

## 2021-09-27 DIAGNOSIS — E1122 Type 2 diabetes mellitus with diabetic chronic kidney disease: Secondary | ICD-10-CM | POA: Diagnosis not present

## 2021-09-29 NOTE — Progress Notes (Signed)
Remote pacemaker transmission.   

## 2021-10-18 DIAGNOSIS — E1122 Type 2 diabetes mellitus with diabetic chronic kidney disease: Secondary | ICD-10-CM | POA: Diagnosis not present

## 2021-10-18 DIAGNOSIS — N1831 Chronic kidney disease, stage 3a: Secondary | ICD-10-CM | POA: Diagnosis not present

## 2021-10-18 DIAGNOSIS — F325 Major depressive disorder, single episode, in full remission: Secondary | ICD-10-CM | POA: Diagnosis not present

## 2021-10-18 DIAGNOSIS — E782 Mixed hyperlipidemia: Secondary | ICD-10-CM | POA: Diagnosis not present

## 2021-12-16 ENCOUNTER — Ambulatory Visit (INDEPENDENT_AMBULATORY_CARE_PROVIDER_SITE_OTHER): Payer: Medicare Other

## 2021-12-16 DIAGNOSIS — I442 Atrioventricular block, complete: Secondary | ICD-10-CM | POA: Diagnosis not present

## 2021-12-16 LAB — CUP PACEART REMOTE DEVICE CHECK
Battery Impedance: 1314 Ohm
Battery Remaining Longevity: 54 mo
Battery Voltage: 2.77 V
Brady Statistic AP VP Percent: 42 %
Brady Statistic AP VS Percent: 0 %
Brady Statistic AS VP Percent: 58 %
Brady Statistic AS VS Percent: 0 %
Date Time Interrogation Session: 20230421103247
Implantable Lead Implant Date: 20140625
Implantable Lead Implant Date: 20140625
Implantable Lead Location: 753859
Implantable Lead Location: 753860
Implantable Lead Model: 5076
Implantable Lead Model: 5076
Implantable Pulse Generator Implant Date: 20140625
Lead Channel Impedance Value: 470 Ohm
Lead Channel Impedance Value: 475 Ohm
Lead Channel Pacing Threshold Amplitude: 0.5 V
Lead Channel Pacing Threshold Amplitude: 0.625 V
Lead Channel Pacing Threshold Pulse Width: 0.4 ms
Lead Channel Pacing Threshold Pulse Width: 0.4 ms
Lead Channel Setting Pacing Amplitude: 1.5 V
Lead Channel Setting Pacing Amplitude: 2 V
Lead Channel Setting Pacing Pulse Width: 0.4 ms
Lead Channel Setting Sensing Sensitivity: 2 mV

## 2021-12-19 ENCOUNTER — Encounter (INDEPENDENT_AMBULATORY_CARE_PROVIDER_SITE_OTHER): Payer: Medicare Other | Admitting: Ophthalmology

## 2021-12-22 ENCOUNTER — Encounter (INDEPENDENT_AMBULATORY_CARE_PROVIDER_SITE_OTHER): Payer: Self-pay | Admitting: Ophthalmology

## 2021-12-22 ENCOUNTER — Ambulatory Visit (INDEPENDENT_AMBULATORY_CARE_PROVIDER_SITE_OTHER): Payer: Medicare Other | Admitting: Ophthalmology

## 2021-12-22 DIAGNOSIS — H35351 Cystoid macular degeneration, right eye: Secondary | ICD-10-CM

## 2021-12-22 MED ORDER — KETOROLAC TROMETHAMINE 0.5 % OP SOLN: 1.0000 [drp] | Freq: Two times a day (BID) | OPHTHALMIC | 5 refills | Status: DC

## 2021-12-22 NOTE — Progress Notes (Signed)
? ? ?12/22/2021 ? ?  ? ?CHIEF COMPLAINT ?Patient presents for  ?Chief Complaint  ?Patient presents with  ? Cystoid Macular Edema  ? ? ? ? ?HISTORY OF PRESENT ILLNESS: ?[name] is a 81 y.o. male who presents to the clinic today for:  ? 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 ? ?Initial evaluation post vitrectomy in August, post vitrectomy, no CME.  CME developed late onset and reached its apex of disease December 2022 at which time topical NSAIDs as well as periocular, retroseptal, subtenon's steroids arrested and reversed the CME ? ?Patient reports ongoing use of topical medications, ketorolac 1 drop 3 times daily OD. ?Patient states vision is stable and unchanged since last visit. Denies any new floaters or FOL. ? ?Last edited by Hurman Horn, MD on 12/22/2021 11:52 AM.  ?  ? ? ?Referring physician: ?Jonathon Jordan, MD ?[address] ?[address] ?[place],  [place] [address] ? ?HISTORICAL INFORMATION:  ? ?Selected notes from the [facility] ?  ? ?Lab Results  ?Component Value Date  ? HGBA1C 5.9 (H) 02/09/2021  ?  ? ?CURRENT MEDICATIONS: ?Current Outpatient Medications (Ophthalmic Drugs)  ?Medication Sig  ? cycloSPORINE (RESTASIS) 0.05 % ophthalmic emulsion Place 1 drop into both eyes 2 (two) times daily.  ? ketorolac (ACULAR) 0.5 % ophthalmic solution Place 1 drop into the right eye 2 (two) times daily.  ? Polyethyl Glycol-Propyl Glycol (SYSTANE OP) Place 1 drop into both eyes 2 (two) times daily.  ? ?No current facility-administered medications for this visit. (Ophthalmic Drugs)  ? ?Current Outpatient Medications (Other)  ?Medication Sig  ? acetaminophen (TYLENOL) 500 MG tablet Take 500 mg by mouth 3 (three) times daily.  ? aspirin 81 MG tablet Take 1 tablet (81 mg total) by mouth daily.  ? busPIRone (BUSPAR) 15 MG tablet Take 15-30 mg by mouth See admin instructions. Take 15 mg by mouth every morning and 30 mg every day at noon  ?  Cholecalciferol (VITAMIN D) 2000 units tablet Take 2,000 Units by mouth every other day.   ? fenofibrate 160 MG tablet Take 160 mg by mouth daily.  ? fluticasone (FLONASE) 50 MCG/ACT nasal spray Place 2 sprays into the nose daily.  ? FORA V12 BLOOD GLUCOSE TEST test strip   ? furosemide (LASIX) 40 MG tablet TAKE ONE TABLET TWICE DAILY  ? gabapentin (NEURONTIN) 300 MG capsule Take 300-600 mg by mouth See admin instructions. Take 300 mg by mouth each morning and 600 mg each afternoon.  ? LITETOUCH LANCETS MISC   ? montelukast (SINGULAIR) 10 MG tablet Take 10 mg by mouth daily.  ? Multiple Vitamin (MULTIVITAMIN WITH MINERALS) TABS tablet Take 1 tablet by mouth daily.  ? polyethylene glycol (MIRALAX / GLYCOLAX) 17 g packet Take 17 g by mouth daily.  ? potassium chloride SA (K-DUR,KLOR-CON) 20 MEQ tablet TAKE ONE TABLET EVERY DAY  ? Probiotic Product (PHILLIPS COLON HEALTH PO) Take 1 capsule by mouth daily.  ? sertraline (ZOLOFT) 100 MG tablet Take 100 mg by mouth daily.  ? traZODone (DESYREL) 50 MG tablet Take 150 mg by mouth at bedtime.  ? ?No current facility-administered medications for this visit. (Other)  ? ? ? ? ?REVIEW OF SYSTEMS: ?ROS   ?Negative for: Constitutional, Gastrointestinal, Neurological, Skin, Genitourinary, Musculoskeletal, HENT, Endocrine, Cardiovascular, Eyes, Respiratory, Psychiatric, Allergic/Imm, Heme/Lymph ?Last edited by Hurman Horn, MD on 12/22/2021 11:52 AM.  ?  ? ? ? ?  ALLERGIES ?Allergies  ?Allergen Reactions  ? Banana Anaphylaxis  ? Penicillins Rash  ?  Has patient had a PCN reaction causing immediate rash, facial/tongue/throat swelling, SOB or lightheadedness with hypotension: Unknown ?Has patient had a PCN reaction causing severe rash involving mucus membranes or skin necrosis: Unknown ?Has patient had a PCN reaction that required hospitalization: No ?Has patient had a PCN reaction occurring within the last 10 years: No ?If all of the above answers are "NO", then may proceed with  Cephalosporin use. ?  ? ? ?PAST MEDICAL HISTORY ?Past Medical History:  ?Diagnosis Date  ? Adverse effect of anesthesia   ? Small airway  ? Aortic stenosis   ? s/p AVR 6/14  ? Arthritis   ? 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 04-06-2021, vitrectomy for ERM completed  ? Asthma   ? Atrioventricular block, complete (HCC)   ? a. s/p MDT dual chamber PPM 2014  ? Cataract 09/2020  ? bilateral  ? CHF (congestive heart failure) ([facility])   ? Chronic lymphocytic leukemia ([facility])   ? followed by Dr. Ouida Sills  ? Depression   ? Difficult intubation 02/09/2021  ? Anterior Airway.  ? DM2 (diabetes mellitus, type 2) ([facility])   ? fasting blood sugar - 90s  ? Exposure to secondhand smoke   ? extensive exposure  ? Heart murmur   ? Hyperlipidemia   ? treated  ? LBBB (left bundle branch block)   ? Testicular cancer ([facility])   ? s/p XRT and surgical resection.  ? ?Past Surgical History:  ?Procedure Laterality Date  ? AORTIC VALVE REPLACEMENT N/A 02/17/2013  ? Procedure: AORTIC VALVE REPLACEMENT (AVR);  Surgeon: Gaye Pollack, MD;  Location: [facility];  Service: Open Heart Surgery;  Laterality: N/A;  ? CYST REMOVAL NECK    ? EYE SURGERY    ? INNER EAR SURGERY    ? INTRAOPERATIVE TRANSESOPHAGEAL ECHOCARDIOGRAM N/A 02/17/2013  ? Procedure: INTRAOPERATIVE TRANSESOPHAGEAL ECHOCARDIOGRAM;  Surgeon: Gaye Pollack, MD;  [facility];  Service: Open Heart Surgery;  Laterality: N/A;  ? LEFT AND RIGHT HEART CATHETERIZATION WITH CORONARY ANGIOGRAM N/A 12/25/2012  ? Procedure: LEFT AND RIGHT HEART CATHETERIZATION WITH CORONARY ANGIOGRAM;  Surgeon: Jolaine Artist, MD;  Location: [facility] CATH LAB;  Service: Cardiovascular;  Laterality: N/A;  ? PERMANENT PACEMAKER INSERTION N/A 02/19/2013  ? Procedure: PERMANENT PACEMAKER INSERTION;  Surgeon: Deboraha Sprang, MD;  Location: [facility] CATH LAB;  Service: Cardiovascular;  Laterality: N/A;  ? RECTAL BIOPSY N/A 10/08/2017  ? Procedure: EXCISION OF ANAL POLYP ;  Surgeon: Ileana Roup, MD;  Location: [facility] ORS;   Service: General;  Laterality: N/A;  ? stapendectomy  07/30/02  ? right  ? XI ROBOT ASSISTED RECTOPEXY N/A 02/09/2021  ? Procedure: XI ROBOT ASSISTED SUTURE  RECTOPEXY;  Surgeon: Ileana Roup, MD;  Location: [facility] ORS;  Service: General;  Laterality: N/A;  ? XRT and surgical resection    ? s/p. 10 years ago  ? ? ?FAMILY HISTORY ?Family History  ?Problem Relation Age of Onset  ? Brain cancer Mother   ? Cancer Father   ?     lung  ? ? ?SOCIAL HISTORY ?Social History  ? ?Tobacco Use  ? Smoking status: Never  ? Smokeless tobacco: Never  ?Vaping Use  ? Vaping Use: Never used  ?Substance Use Topics  ? Alcohol use: No  ? Drug use: No  ? ?  ? ?  ? ?OPHTHALMIC EXAM: ? ?Base Eye Exam   ? ?  Visual Acuity (ETDRS)   ? ?   Right Left  ? Dist [place] 20/25 -1 20/25 +2  ? ? Correction: Glasses  ? ?  ?  ? ? Tonometry (Tonopen, 11:03 AM)   ? ?   Right Left  ? Pressure 13 8  ? ?  ?  ? ? Pupils   ? ?   Dark Light APD  ? Right 4.5 3.5 None  ? Left 4 3 None  ? ?  ?  ? ? Extraocular Movement   ? ?   Right Left  ?  Full Full  ? ?  ?  ? ? Neuro/Psych   ? ? Oriented x3: Yes  ? Mood/Affect: Normal  ? ?  ?  ? ? Dilation   ? ? Right eye: 1.0% Mydriacyl, 2.5% Phenylephrine @ 11:03 AM  ? ?  ?  ? ?  ? ?Slit Lamp and Fundus Exam   ? ? External Exam   ? ?   Right Left  ? External Normal Normal  ? ?  ?  ? ? Slit Lamp Exam   ? ?   Right Left  ? Lids/Lashes Normal Normal  ? Conjunctiva/Sclera White and quiet White and quiet  ? Cornea Clear Clear  ? Anterior Chamber Deep and quiet Deep and quiet  ? Iris Round and reactive Round and reactive  ? Lens Centered posterior chamber intraocular lens Centered posterior chamber intraocular lens  ? Anterior Vitreous Clear avitric Normal  ? ?  ?  ? ? Fundus Exam   ? ?   Right Left  ? Posterior Vitreous Clear avitric   ? Disc Peripapillary atrophy   ? C/D Ratio 0.4   ? Macula No topographic distortion remains post ILM removal changes present   ? Vessels Normal   ? Periphery Normal   ? ?  ?  ? ?  ? ? ?IMAGING AND  PROCEDURES  ?Imaging and Procedures for 12/22/21 ? ?OCT, Retina - OU - Both Eyes   ? ?   ?Right Eye ?Quality was good. Scan locations included subfoveal. Central Foveal Thickness: 361. Progression has no prior data.

## 2021-12-22 NOTE — Patient Instructions (Addendum)
Taper topical NSAID, ketorolac to 1 drop right eye twice daily and discontinue 2 weeks prior to next appointment ?

## 2021-12-22 NOTE — Assessment & Plan Note (Signed)
OD now improved overall since control initiated with retroseptal Kenalog December 2022 and topical NSAIDs.  Now on topical ketorolac 3 times daily ? ?We will attempt to taper this to now twice daily and use that for the next 3 months.  At which time 2 weeks prior to his return, he will last I will asked the patient to discontinue the drop and we will see if the medication needs to be continued thereafter ?

## 2021-12-23 DIAGNOSIS — E1122 Type 2 diabetes mellitus with diabetic chronic kidney disease: Secondary | ICD-10-CM | POA: Diagnosis not present

## 2021-12-23 DIAGNOSIS — E782 Mixed hyperlipidemia: Secondary | ICD-10-CM | POA: Diagnosis not present

## 2021-12-23 DIAGNOSIS — N1831 Chronic kidney disease, stage 3a: Secondary | ICD-10-CM | POA: Diagnosis not present

## 2021-12-23 DIAGNOSIS — F325 Major depressive disorder, single episode, in full remission: Secondary | ICD-10-CM | POA: Diagnosis not present

## 2022-01-03 NOTE — Progress Notes (Signed)
Remote pacemaker transmission.   

## 2022-01-05 DIAGNOSIS — H524 Presbyopia: Secondary | ICD-10-CM | POA: Diagnosis not present

## 2022-01-05 DIAGNOSIS — Z961 Presence of intraocular lens: Secondary | ICD-10-CM | POA: Diagnosis not present

## 2022-01-05 DIAGNOSIS — E119 Type 2 diabetes mellitus without complications: Secondary | ICD-10-CM | POA: Diagnosis not present

## 2022-01-05 DIAGNOSIS — H5213 Myopia, bilateral: Secondary | ICD-10-CM | POA: Diagnosis not present

## 2022-01-05 DIAGNOSIS — H26493 Other secondary cataract, bilateral: Secondary | ICD-10-CM | POA: Diagnosis not present

## 2022-01-05 DIAGNOSIS — H52203 Unspecified astigmatism, bilateral: Secondary | ICD-10-CM | POA: Diagnosis not present

## 2022-02-01 DIAGNOSIS — H26491 Other secondary cataract, right eye: Secondary | ICD-10-CM | POA: Diagnosis not present

## 2022-02-08 DIAGNOSIS — H26492 Other secondary cataract, left eye: Secondary | ICD-10-CM | POA: Diagnosis not present

## 2022-02-10 ENCOUNTER — Other Ambulatory Visit (HOSPITAL_COMMUNITY): Payer: Self-pay | Admitting: Internal Medicine

## 2022-02-14 DIAGNOSIS — D692 Other nonthrombocytopenic purpura: Secondary | ICD-10-CM | POA: Diagnosis not present

## 2022-02-14 DIAGNOSIS — I350 Nonrheumatic aortic (valve) stenosis: Secondary | ICD-10-CM | POA: Diagnosis not present

## 2022-02-14 DIAGNOSIS — Z79899 Other long term (current) drug therapy: Secondary | ICD-10-CM | POA: Diagnosis not present

## 2022-02-14 DIAGNOSIS — C4442 Squamous cell carcinoma of skin of scalp and neck: Secondary | ICD-10-CM | POA: Diagnosis not present

## 2022-02-14 DIAGNOSIS — E1122 Type 2 diabetes mellitus with diabetic chronic kidney disease: Secondary | ICD-10-CM | POA: Diagnosis not present

## 2022-02-14 DIAGNOSIS — Z23 Encounter for immunization: Secondary | ICD-10-CM | POA: Diagnosis not present

## 2022-02-14 DIAGNOSIS — Z0000 Encounter for general adult medical examination without abnormal findings: Secondary | ICD-10-CM | POA: Diagnosis not present

## 2022-02-14 DIAGNOSIS — C9191 Lymphoid leukemia, unspecified, in remission: Secondary | ICD-10-CM | POA: Diagnosis not present

## 2022-02-14 DIAGNOSIS — R159 Full incontinence of feces: Secondary | ICD-10-CM | POA: Diagnosis not present

## 2022-02-14 DIAGNOSIS — E538 Deficiency of other specified B group vitamins: Secondary | ICD-10-CM | POA: Diagnosis not present

## 2022-02-14 DIAGNOSIS — E782 Mixed hyperlipidemia: Secondary | ICD-10-CM | POA: Diagnosis not present

## 2022-02-14 DIAGNOSIS — I253 Aneurysm of heart: Secondary | ICD-10-CM | POA: Diagnosis not present

## 2022-03-03 DIAGNOSIS — L309 Dermatitis, unspecified: Secondary | ICD-10-CM | POA: Diagnosis not present

## 2022-03-14 ENCOUNTER — Encounter (HOSPITAL_COMMUNITY): Payer: Self-pay | Admitting: Internal Medicine

## 2022-03-14 ENCOUNTER — Ambulatory Visit (HOSPITAL_COMMUNITY)
Admission: RE | Admit: 2022-03-14 | Discharge: 2022-03-14 | Disposition: A | Discharge status: Home / Self Care | Payer: Medicare Other | Source: Ambulatory Visit | Attending: Internal Medicine | Admitting: Internal Medicine

## 2022-03-14 VITALS — BP 130/70 | HR 60 | Wt 115.4 lb

## 2022-03-14 DIAGNOSIS — Z950 Presence of cardiac pacemaker: Secondary | ICD-10-CM | POA: Diagnosis not present

## 2022-03-14 DIAGNOSIS — I442 Atrioventricular block, complete: Secondary | ICD-10-CM | POA: Insufficient documentation

## 2022-03-14 DIAGNOSIS — Z952 Presence of prosthetic heart valve: Secondary | ICD-10-CM | POA: Insufficient documentation

## 2022-03-14 DIAGNOSIS — Z79899 Other long term (current) drug therapy: Secondary | ICD-10-CM | POA: Insufficient documentation

## 2022-03-14 DIAGNOSIS — I5032 Chronic diastolic (congestive) heart failure: Secondary | ICD-10-CM | POA: Diagnosis not present

## 2022-03-14 DIAGNOSIS — E119 Type 2 diabetes mellitus without complications: Secondary | ICD-10-CM | POA: Insufficient documentation

## 2022-03-14 DIAGNOSIS — E785 Hyperlipidemia, unspecified: Secondary | ICD-10-CM | POA: Insufficient documentation

## 2022-03-14 NOTE — Progress Notes (Signed)
CARDIOLOGY CLINIC NOTE  Patient ID: [name], male   DOB: Jun 08, 1941, 81 y.o.   MRN: [id number]  PCP: Dr. Tor Netters is 81 y/o male with h/o AS s/p bioprosthetic AVR (01/2013), DM2, CHB (s/p pacemaker) and hyperlipidemia. Bioprosthetic AVR with ligation of diagonal-PA fistula by Dr. Cyndia Bent on 02/17/13. He had post-operative complete heart block. A Medtronic dual chamber pacemaker was placed 02/19/13.   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 Leamon Arnt.  Very stable.   Here for f/u. Doing well except for his eye issues. Walks the dog about 1 mile per day. Still working at [employer]. No SOB, CP, orthopnea or PND. Had rectal prolapse fixed but still having problems with it. Follows SBE  Following with Dr. Caryl Comes for his pacer.   Echo 5/22 55-60% AVR stable mean gradient 7 triv Ai   Echo 10/19 EF 55-60% AVR stable Echo 9/17: EF 60% AVR stable   ROS: All systems negative except as listed in HPI, PMH and Problem List.  SH: Married, lives in [place], nonsmoker, works at the [employer].   Past Medical History  Diagnosis Date   Aortic stenosis     moderate. Echo 3/10: EF 55-60%. mean gradient 25 mmHg. AVA 1.23cm2. Echo 3/11: Normal EF. mean AVA gradient 4mHg. exercise myoview 2008: positive ECG. normal perfusion.  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   Chronic lymphocytic leukemia     followed by Dr. GOuida Sills - CHB post-op AoV surgery.  Medtronic dual chamber PCM.    Current Outpatient Medications  Medication Sig Dispense Refill   acetaminophen (TYLENOL) 500 MG tablet Take 1,000 mg by mouth in the morning, at noon, in the evening, and at bedtime.     aspirin 81 MG tablet Take 1 tablet (81 mg total) by mouth daily. 30 tablet 11   busPIRone (BUSPAR) 15 MG tablet Take 15-30 mg by mouth See admin instructions. Take 15 mg by mouth every morning and 30 mg every day at noon     Cholecalciferol (VITAMIN D) 2000 units tablet Take 2,000 Units by mouth every other day.      cycloSPORINE (RESTASIS) 0.05 % ophthalmic emulsion Place 1 drop into both eyes 2 (two) times daily.     fenofibrate 160 MG tablet Take 160 mg by mouth daily.     fluticasone (FLONASE) 50 MCG/ACT nasal spray Place 2 sprays into the nose daily.     FORA V12 BLOOD GLUCOSE TEST test strip      furosemide (LASIX) 40 MG tablet TAKE ONE TABLET TWICE DAILY 60 tablet 11   gabapentin (NEURONTIN) 300 MG capsule Take 300 mg by mouth 2 (two) times daily.     ketorolac (ACULAR) 0.5 % ophthalmic solution Place 1 drop into the right eye 2 (two) times daily. 5 mL 5   LITETOUCH LANCETS MISC      montelukast (SINGULAIR) 10 MG  tablet Take 10 mg by mouth daily.     Multiple Vitamin (MULTIVITAMIN WITH MINERALS) TABS tablet Take 1 tablet by mouth daily.     Polyethyl Glycol-Propyl Glycol (SYSTANE OP) Place 1 drop into both eyes 2 (two) times daily.     potassium chloride SA (K-DUR,KLOR-CON) 20 MEQ tablet TAKE ONE TABLET EVERY DAY 90 tablet 3   Probiotic Product (PHILLIPS COLON HEALTH PO) Take 1 capsule by mouth daily.     sertraline (ZOLOFT) 100 MG tablet Take 100 mg by mouth daily.     traZODone (DESYREL) 50 MG tablet Take 150 mg by mouth at bedtime.     No current facility-administered medications for this encounter.     PHYSICAL EXAM: Vitals:   03/14/22 1207  BP: 130/70  Pulse: 60  SpO2: 97%  Weight: 52.3 kg (115 lb 6.4 oz)   General:  Well appearing. No resp difficulty HEENT: normal Neck: supple. no  JVD. Carotids 2+ bilat; + bruits. No lymphadenopathy or thryomegaly appreciated. Cor: PMI nondisplaced. Regular rate & rhythm. 2/5 SEM RUSB s2 crisp  Lungs: clear Abdomen: soft, nontender, nondistended. No hepatosplenomegaly. No bruits or masses. Good bowel sounds. Extremities: no cyanosis, clubbing, rash, edema Neuro: alert & orientedx3, cranial nerves grossly intact. moves all 4 extremities w/o difficulty. Affect pleasant   ASSESSMENT & PLAN: 1. Status post bioprosthetic AVR: Stable. Doing well continues with CR maintenance program. He is compliant with SBE prophylaxis - Echo 5/22 looks good. Will follow q2 years with echo  - Continue SB prophylaxis 2. Complete heart block: Medtronic PCM.  Followed by Dr. Caryl Comes.  - Pacer interrogated today. Normal function  AS-VP 59%, AP-VP 41% - Followed by Dr. Caryl Comes 3. Diagonal-PA fistula: Ligated at time of AVR.  4. HL -  Per PCP  Glori Bickers MD 03/14/2022

## 2022-03-14 NOTE — Addendum Note (Signed)
Encounter addended by: Scarlette Calico, RN on: 03/14/2022 12:41 PM  Actions taken: Visit diagnoses modified, Order list changed, Diagnosis association updated, Clinical Note Signed

## 2022-03-14 NOTE — Patient Instructions (Signed)
Your physician recommends that you schedule a follow-up appointment in: 1 year with an echocardiogram, **PLEASE CALL OUR OFFICE IN MAY 2024 TO SCHEDULE THIS APPOINTMENT  If you have any questions or concerns before your next appointment please send [place] a message through [place] or call our office at [phone].    TO LEAVE A MESSAGE FOR THE NURSE SELECT OPTION 2, PLEASE LEAVE A MESSAGE INCLUDING: YOUR NAME DATE OF BIRTH CALL BACK NUMBER REASON FOR CALL**this is important as we prioritize the call backs  YOU WILL RECEIVE A CALL BACK THE SAME DAY AS LONG AS YOU CALL BEFORE 4:00 PM

## 2022-03-17 ENCOUNTER — Ambulatory Visit (INDEPENDENT_AMBULATORY_CARE_PROVIDER_SITE_OTHER): Payer: Medicare Other

## 2022-03-17 DIAGNOSIS — I442 Atrioventricular block, complete: Secondary | ICD-10-CM | POA: Diagnosis not present

## 2022-03-17 LAB — CUP PACEART REMOTE DEVICE CHECK
Battery Impedance: 1398 Ohm
Battery Remaining Longevity: 51 mo
Battery Voltage: 2.78 V
Brady Statistic AP VP Percent: 41 %
Brady Statistic AP VS Percent: 0 %
Brady Statistic AS VP Percent: 59 %
Brady Statistic AS VS Percent: 0 %
Date Time Interrogation Session: 20230721100933
Implantable Lead Implant Date: 20140625
Implantable Lead Implant Date: 20140625
Implantable Lead Location: 753859
Implantable Lead Location: 753860
Implantable Lead Model: 5076
Implantable Lead Model: 5076
Implantable Pulse Generator Implant Date: 20140625
Lead Channel Impedance Value: 456 Ohm
Lead Channel Impedance Value: 475 Ohm
Lead Channel Pacing Threshold Amplitude: 0.5 V
Lead Channel Pacing Threshold Amplitude: 0.625 V
Lead Channel Pacing Threshold Pulse Width: 0.4 ms
Lead Channel Pacing Threshold Pulse Width: 0.4 ms
Lead Channel Setting Pacing Amplitude: 1.5 V
Lead Channel Setting Pacing Amplitude: 2 V
Lead Channel Setting Pacing Pulse Width: 0.4 ms
Lead Channel Setting Sensing Sensitivity: 2 mV

## 2022-03-21 DIAGNOSIS — E1122 Type 2 diabetes mellitus with diabetic chronic kidney disease: Secondary | ICD-10-CM | POA: Diagnosis not present

## 2022-03-21 DIAGNOSIS — Z85828 Personal history of other malignant neoplasm of skin: Secondary | ICD-10-CM | POA: Diagnosis not present

## 2022-03-21 DIAGNOSIS — N1831 Chronic kidney disease, stage 3a: Secondary | ICD-10-CM | POA: Diagnosis not present

## 2022-03-21 DIAGNOSIS — R21 Rash and other nonspecific skin eruption: Secondary | ICD-10-CM | POA: Diagnosis not present

## 2022-03-21 DIAGNOSIS — F325 Major depressive disorder, single episode, in full remission: Secondary | ICD-10-CM | POA: Diagnosis not present

## 2022-03-21 DIAGNOSIS — E782 Mixed hyperlipidemia: Secondary | ICD-10-CM | POA: Diagnosis not present

## 2022-03-23 ENCOUNTER — Ambulatory Visit (INDEPENDENT_AMBULATORY_CARE_PROVIDER_SITE_OTHER): Payer: Medicare Other | Admitting: Ophthalmology

## 2022-03-23 ENCOUNTER — Encounter (INDEPENDENT_AMBULATORY_CARE_PROVIDER_SITE_OTHER): Payer: Medicare Other | Admitting: Ophthalmology

## 2022-03-23 ENCOUNTER — Encounter (INDEPENDENT_AMBULATORY_CARE_PROVIDER_SITE_OTHER): Payer: Self-pay | Admitting: Ophthalmology

## 2022-03-23 DIAGNOSIS — H35351 Cystoid macular degeneration, right eye: Secondary | ICD-10-CM | POA: Diagnosis not present

## 2022-03-23 DIAGNOSIS — H35371 Puckering of macula, right eye: Secondary | ICD-10-CM

## 2022-03-23 NOTE — Assessment & Plan Note (Signed)
Patient successfully weaned off the topical ketorolac right eye and over the last 2 weeks no recurrence of CME is developed.  We will thus attempt to simply follow-up in 6 months or promptly if visual blurring recurs in this eye.

## 2022-03-23 NOTE — Progress Notes (Signed)
03/23/2022     CHIEF COMPLAINT Patient presents for  Chief Complaint  Patient presents with   Epiretinal Membrane/preretinal Fibrosis/cellophane Maculopathy      HISTORY OF PRESENT ILLNESS: [name] is a 81 y.o. male who presents to the clinic today for:     Referring physician: Jonathon Jordan, MD [address] [place],  [place] [address]  HISTORICAL INFORMATION:   Selected notes from the MEDICAL RECORD NUMBER    Lab Results  Component Value Date   HGBA1C 5.9 (H) 02/09/2021     CURRENT MEDICATIONS: Current Outpatient Medications (Ophthalmic Drugs)  Medication Sig   cycloSPORINE (RESTASIS) 0.05 % ophthalmic emulsion Place 1 drop into both eyes 2 (two) times daily.   ketorolac (ACULAR) 0.5 % ophthalmic solution Place 1 drop into the right eye 2 (two) times daily.   Polyethyl Glycol-Propyl Glycol (SYSTANE OP) Place 1 drop into both eyes 2 (two) times daily.   No current facility-administered medications for this visit. (Ophthalmic Drugs)   Current Outpatient Medications (Other)  Medication Sig   acetaminophen (TYLENOL) 500 MG tablet Take 1,000 mg by mouth in the morning, at noon, in the evening, and at bedtime.   aspirin 81 MG tablet Take 1 tablet (81 mg total) by mouth daily.   busPIRone (BUSPAR) 15 MG tablet Take 15-30 mg by mouth See admin instructions. Take 15 mg by mouth every morning and 30 mg every day at noon   Cholecalciferol (VITAMIN D) 2000 units tablet Take 2,000 Units by mouth every other day.    fenofibrate 160 MG tablet Take 160 mg by mouth daily.   fluticasone (FLONASE) 50 MCG/ACT nasal spray Place 2 sprays into the nose daily.   FORA V12 BLOOD GLUCOSE TEST test strip    furosemide (LASIX) 40 MG tablet TAKE ONE TABLET TWICE DAILY   gabapentin (NEURONTIN) 300 MG capsule Take 300 mg by mouth 2 (two) times daily.   LITETOUCH LANCETS MISC    montelukast (SINGULAIR) 10 MG tablet Take 10 mg by mouth daily.   Multiple Vitamin (MULTIVITAMIN  WITH MINERALS) TABS tablet Take 1 tablet by mouth daily.   potassium chloride SA (K-DUR,KLOR-CON) 20 MEQ tablet TAKE ONE TABLET EVERY DAY   Probiotic Product (PHILLIPS COLON HEALTH PO) Take 1 capsule by mouth daily.   sertraline (ZOLOFT) 100 MG tablet Take 100 mg by mouth daily.   traZODone (DESYREL) 50 MG tablet Take 150 mg by mouth at bedtime.   No current facility-administered medications for this visit. (Other)      REVIEW OF SYSTEMS: ROS   Negative for: Constitutional, Gastrointestinal, Neurological, Skin, Genitourinary, Musculoskeletal, HENT, Endocrine, Cardiovascular, Eyes, Respiratory, Psychiatric, Allergic/Imm, Heme/Lymph Last edited by Hurman Horn, MD on 03/23/2022 10:57 AM.       ALLERGIES Allergies  Allergen Reactions   Banana Anaphylaxis   Penicillins Rash    Has patient had a PCN reaction causing immediate rash, facial/tongue/throat swelling, SOB or lightheadedness with hypotension: Unknown Has patient had a PCN reaction causing severe rash involving mucus membranes or skin necrosis: Unknown Has patient had a PCN reaction that required hospitalization: No Has patient had a PCN reaction occurring within the last 10 years: No If all of the above answers are "NO", then may proceed with Cephalosporin use.     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 s/p MDT dual chamber PPM 2014   Cataract 09/2020   bilateral   CHF (congestive heart failure) ([place])    Chronic lymphocytic leukemia ([place])    followed by Dr. Ouida Sills   Depression    Difficult intubation 02/09/2021   Anterior Airway.   DM2 (diabetes mellitus, type 2) ([facility])    fasting blood sugar - 90s   Exposure to secondhand smoke    extensive exposure    Heart murmur    Hyperlipidemia    treated   LBBB (left bundle branch block)    Testicular cancer [facility])    s/p XRT and surgical resection.   Past Surgical History:  Procedure Laterality Date   AORTIC VALVE REPLACEMENT N/A 02/17/2013   Procedure: AORTIC VALVE REPLACEMENT (AVR);  Surgeon: Gaye Pollack, MD;  Location: [facility];  Service: Open Heart Surgery;  Laterality: N/A;   CYST REMOVAL NECK     EYE SURGERY     INNER EAR SURGERY     INTRAOPERATIVE TRANSESOPHAGEAL ECHOCARDIOGRAM N/A 02/17/2013   Procedure: INTRAOPERATIVE TRANSESOPHAGEAL ECHOCARDIOGRAM;  Surgeon: Gaye Pollack, MD;  Location: [facility];  Service: Open Heart Surgery;  Laterality: N/A;   LEFT AND RIGHT HEART CATHETERIZATION WITH CORONARY ANGIOGRAM N/A 12/25/2012   Procedure: LEFT AND RIGHT HEART CATHETERIZATION WITH CORONARY ANGIOGRAM;  Surgeon: Jolaine Artist, MD;  Location: [facility] CATH LAB;  Service: Cardiovascular;  Laterality: N/A;   PERMANENT PACEMAKER INSERTION N/A 02/19/2013   Procedure: PERMANENT PACEMAKER INSERTION;  Surgeon: Deboraha Sprang, MD;  Location: [facility] CATH LAB;  Service: Cardiovascular;  Laterality: N/A;   RECTAL BIOPSY N/A 10/08/2017   Procedure: EXCISION OF ANAL POLYP ;  Surgeon: Ileana Roup, MD;  Location: [facility] ORS;  Service: General;  Laterality: N/A;   stapendectomy  07/30/02   right   XI ROBOT ASSISTED RECTOPEXY N/A 02/09/2021   Procedure: XI ROBOT ASSISTED SUTURE  RECTOPEXY;  Surgeon: Ileana Roup, MD;  Location: [facility] ORS;  Service: General;  Laterality: N/A;   XRT and surgical resection     s/p. 10 years ago    FAMILY HISTORY Family History  Problem Relation Age of Onset   Brain cancer Mother    Cancer Father        lung    SOCIAL HISTORY Social History   Tobacco Use   Smoking status: Never   Smokeless tobacco: Never  Vaping Use   Vaping Use: Never used  Substance Use Topics   Alcohol use: No   Drug use: No         OPHTHALMIC EXAM:  Base Eye Exam     Visual Acuity  (ETDRS)       Right Left   Dist cc 20/30 +2 20/20 -2    Correction: Glasses         Tonometry (Tonopen, 10:57 AM)       Right Left   Pressure 18 10         Pupils       Pupils APD   Right PERRL None   Left PERRL None         Visual Fields       Left Right    Full Full         Neuro/Psych     Oriented x3: Yes   Mood/Affect: Normal  Slit Lamp and Fundus Exam     External Exam       Right Left   External Normal Normal         Slit Lamp Exam       Right Left   Lids/Lashes Normal Normal   Conjunctiva/Sclera White and quiet White and quiet   Cornea Clear Clear   Anterior Chamber Deep and quiet Deep and quiet   Iris Round and reactive Round and reactive   Lens Centered posterior chamber intraocular lens Centered posterior chamber intraocular lens   Anterior Vitreous Clear avitric Normal         Fundus Exam       Right Left   Posterior Vitreous Clear avitric    Disc Peripapillary atrophy    C/D Ratio 0.4    Macula No topographic distortion remains post ILM removal changes present    Vessels Normal    Periphery Normal             IMAGING AND PROCEDURES  Imaging and Procedures for 03/23/22  OCT, Retina - OU - Both Eyes       Right Eye Quality was good. Scan locations included subfoveal. Central Foveal Thickness: 362. Progression has been stable. Findings include abnormal foveal contour.   Left Eye Quality was good. Scan locations included subfoveal. Central Foveal Thickness: 296. Progression has been stable. Findings include normal foveal contour, vitreomacular adhesion .   Notes CME  resolved OD post vitrectomy ILM peel for epiretinal membrane and asteroid hyalosis.  Now also with there is no residual CME and no recurrence of CME post recent tapering of topical ketorolac right eye  Overall improved OS with incidental VMA, no topographic distortions              ASSESSMENT/PLAN:  Cystoid macular edema,  right eye Patient successfully weaned off the topical ketorolac right eye and over the last 2 weeks no recurrence of CME is developed.  We will thus attempt to simply follow-up in 6 months or promptly if visual blurring recurs in this eye.  Macular pucker, right eye Resolved post vitrectomy August 2022     ICD-10-CM   1. Cystoid macular edema, right eye  H35.351 OCT, Retina - OU - Both Eyes    2. Macular pucker, right eye  H35.371       1.  OD, successful therapy f topical medications in the past for CME chronic and recurrent after vitrectomy membrane peel and removal of top hyaloid with asteroid hyalosis from August 2022.  Successful tapering of ketorolac with no recurrence of CME  2.  Patient instructed to call office promptly for new onset visual blurring.  Continue without use of topical medications in the right eye namely ketorolac.  3.  Patient may continue on Restasis as prescribed elsewhere  Ophthalmic Meds Ordered this visit:  No orders of the defined types were placed in this encounter.      Return in about 6 months (around 09/23/2022) for DILATE OU, OCT.  There are no Patient Instructions on file for this visit.   Explained the diagnoses, plan, and follow up with the patient and they expressed understanding.  Patient expressed understanding of the importance of proper follow up care.   Clent Demark Zalen Sequeira M.D. Diseases & Surgery of the Retina and Vitreous [facility] [place] 03/23/22     Abbreviations: M myopia (nearsighted); A astigmatism; H hyperopia (farsighted); P presbyopia; Mrx spectacle prescription;  CTL contact lenses; OD right eye;  OS left eye; OU both eyes  XT exotropia; ET esotropia; PEK punctate epithelial keratitis; PEE punctate epithelial erosions; DES dry eye syndrome; MGD meibomian gland dysfunction; ATs artificial tears; PFAT's preservative free artificial tears; [place] nuclear sclerotic cataract; PSC posterior subcapsular cataract; ERM  epi-retinal membrane; PVD posterior vitreous detachment; RD retinal detachment; DM diabetes mellitus; DR diabetic retinopathy; NPDR non-proliferative diabetic retinopathy; PDR proliferative diabetic retinopathy; CSME clinically significant macular edema; DME diabetic macular edema; dbh dot blot hemorrhages; CWS cotton wool spot; POAG primary open angle glaucoma; C/D cup-to-disc ratio; HVF humphrey visual field; GVF goldmann visual field; OCT optical coherence tomography; IOP intraocular pressure; BRVO Branch retinal vein occlusion; CRVO central retinal vein occlusion; CRAO central retinal artery occlusion; BRAO branch retinal artery occlusion; RT retinal tear; SB scleral buckle; PPV pars plana vitrectomy; VH Vitreous hemorrhage; PRP panretinal laser photocoagulation; IVK intravitreal kenalog; VMT vitreomacular traction; MH Macular hole;  NVD neovascularization of the disc; NVE neovascularization elsewhere; AREDS age related eye disease study; ARMD age related macular degeneration; POAG primary open angle glaucoma; EBMD epithelial/anterior basement membrane dystrophy; ACIOL anterior chamber intraocular lens; IOL intraocular lens; PCIOL posterior chamber intraocular lens; Phaco/IOL phacoemulsification with intraocular lens placement; [place] photorefractive keratectomy; LASIK laser assisted in situ keratomileusis; HTN hypertension; DM diabetes mellitus; COPD chronic obstructive pulmonary disease

## 2022-03-23 NOTE — Assessment & Plan Note (Signed)
Resolved post vitrectomy August 2022

## 2022-03-31 NOTE — Progress Notes (Signed)
Remote pacemaker transmission.   

## 2022-05-17 DIAGNOSIS — S8002XD Contusion of left knee, subsequent encounter: Secondary | ICD-10-CM | POA: Diagnosis not present

## 2022-05-25 DIAGNOSIS — F325 Major depressive disorder, single episode, in full remission: Secondary | ICD-10-CM | POA: Diagnosis not present

## 2022-05-25 DIAGNOSIS — E782 Mixed hyperlipidemia: Secondary | ICD-10-CM | POA: Diagnosis not present

## 2022-05-25 DIAGNOSIS — N400 Enlarged prostate without lower urinary tract symptoms: Secondary | ICD-10-CM | POA: Diagnosis not present

## 2022-05-25 DIAGNOSIS — E1122 Type 2 diabetes mellitus with diabetic chronic kidney disease: Secondary | ICD-10-CM | POA: Diagnosis not present

## 2022-05-25 DIAGNOSIS — N1831 Chronic kidney disease, stage 3a: Secondary | ICD-10-CM | POA: Diagnosis not present

## 2022-06-16 ENCOUNTER — Ambulatory Visit (INDEPENDENT_AMBULATORY_CARE_PROVIDER_SITE_OTHER): Payer: Medicare Other

## 2022-06-16 DIAGNOSIS — I442 Atrioventricular block, complete: Secondary | ICD-10-CM | POA: Diagnosis not present

## 2022-06-16 LAB — CUP PACEART REMOTE DEVICE CHECK
Battery Impedance: 1506 Ohm
Battery Remaining Longevity: 49 mo
Battery Voltage: 2.77 V
Brady Statistic AP VP Percent: 40 %
Brady Statistic AP VS Percent: 0 %
Brady Statistic AS VP Percent: 60 %
Brady Statistic AS VS Percent: 0 %
Date Time Interrogation Session: 20231020094637
Implantable Lead Implant Date: 20140625
Implantable Lead Implant Date: 20140625
Implantable Lead Location: 753859
Implantable Lead Location: 753860
Implantable Lead Model: 5076
Implantable Lead Model: 5076
Implantable Pulse Generator Implant Date: 20140625
Lead Channel Impedance Value: 457 Ohm
Lead Channel Impedance Value: 473 Ohm
Lead Channel Pacing Threshold Amplitude: 0.5 V
Lead Channel Pacing Threshold Amplitude: 0.625 V
Lead Channel Pacing Threshold Pulse Width: 0.4 ms
Lead Channel Pacing Threshold Pulse Width: 0.4 ms
Lead Channel Setting Pacing Amplitude: 1.5 V
Lead Channel Setting Pacing Amplitude: 2 V
Lead Channel Setting Pacing Pulse Width: 0.4 ms
Lead Channel Setting Sensing Sensitivity: 2 mV

## 2022-06-22 NOTE — Progress Notes (Signed)
Remote pacemaker transmission.   

## 2022-07-11 ENCOUNTER — Other Ambulatory Visit: Payer: Self-pay | Admitting: Nurse Practitioner

## 2022-07-11 DIAGNOSIS — D3A8 Other benign neuroendocrine tumors: Secondary | ICD-10-CM

## 2022-07-13 DIAGNOSIS — Z85828 Personal history of other malignant neoplasm of skin: Secondary | ICD-10-CM | POA: Diagnosis not present

## 2022-07-13 DIAGNOSIS — D2271 Melanocytic nevi of right lower limb, including hip: Secondary | ICD-10-CM | POA: Diagnosis not present

## 2022-07-13 DIAGNOSIS — D485 Neoplasm of uncertain behavior of skin: Secondary | ICD-10-CM | POA: Diagnosis not present

## 2022-07-13 DIAGNOSIS — D2261 Melanocytic nevi of right upper limb, including shoulder: Secondary | ICD-10-CM | POA: Diagnosis not present

## 2022-07-13 DIAGNOSIS — D225 Melanocytic nevi of trunk: Secondary | ICD-10-CM | POA: Diagnosis not present

## 2022-07-13 DIAGNOSIS — D2262 Melanocytic nevi of left upper limb, including shoulder: Secondary | ICD-10-CM | POA: Diagnosis not present

## 2022-07-13 DIAGNOSIS — D044 Carcinoma in situ of skin of scalp and neck: Secondary | ICD-10-CM | POA: Diagnosis not present

## 2022-07-13 DIAGNOSIS — D1801 Hemangioma of skin and subcutaneous tissue: Secondary | ICD-10-CM | POA: Diagnosis not present

## 2022-07-13 DIAGNOSIS — L570 Actinic keratosis: Secondary | ICD-10-CM | POA: Diagnosis not present

## 2022-07-13 DIAGNOSIS — L821 Other seborrheic keratosis: Secondary | ICD-10-CM | POA: Diagnosis not present

## 2022-07-25 ENCOUNTER — Ambulatory Visit
Admission: RE | Admit: 2022-07-25 | Discharge: 2022-07-25 | Disposition: A | Discharge status: Home / Self Care | Payer: Medicare Other | Source: Ambulatory Visit | Attending: Nurse Practitioner | Admitting: Nurse Practitioner

## 2022-07-25 DIAGNOSIS — N200 Calculus of kidney: Secondary | ICD-10-CM | POA: Diagnosis not present

## 2022-07-25 DIAGNOSIS — K769 Liver disease, unspecified: Secondary | ICD-10-CM | POA: Diagnosis not present

## 2022-07-25 DIAGNOSIS — C6291 Malignant neoplasm of right testis, unspecified whether descended or undescended: Secondary | ICD-10-CM | POA: Diagnosis not present

## 2022-07-25 DIAGNOSIS — D3A8 Other benign neuroendocrine tumors: Secondary | ICD-10-CM

## 2022-07-25 DIAGNOSIS — N3289 Other specified disorders of bladder: Secondary | ICD-10-CM | POA: Diagnosis not present

## 2022-07-25 MED ORDER — IOPAMIDOL (ISOVUE-300) INJECTION 61%
100.0000 mL | Freq: Once | INTRAVENOUS | Status: AC | PRN
  Administered 2022-07-25: 100 mL via INTRAVENOUS

## 2022-08-04 DIAGNOSIS — K669 Disorder of peritoneum, unspecified: Secondary | ICD-10-CM | POA: Diagnosis not present

## 2022-08-04 DIAGNOSIS — D3A8 Other benign neuroendocrine tumors: Secondary | ICD-10-CM | POA: Diagnosis not present

## 2022-08-04 DIAGNOSIS — K769 Liver disease, unspecified: Secondary | ICD-10-CM | POA: Diagnosis not present

## 2022-08-07 DIAGNOSIS — C7B8 Other secondary neuroendocrine tumors: Secondary | ICD-10-CM | POA: Diagnosis not present

## 2022-08-07 DIAGNOSIS — C7A8 Other malignant neuroendocrine tumors: Secondary | ICD-10-CM | POA: Diagnosis not present

## 2022-08-17 DIAGNOSIS — N1831 Chronic kidney disease, stage 3a: Secondary | ICD-10-CM | POA: Diagnosis not present

## 2022-08-17 DIAGNOSIS — E1122 Type 2 diabetes mellitus with diabetic chronic kidney disease: Secondary | ICD-10-CM | POA: Diagnosis not present

## 2022-09-01 ENCOUNTER — Other Ambulatory Visit: Payer: Self-pay

## 2022-09-01 ENCOUNTER — Other Ambulatory Visit: Payer: Self-pay | Admitting: *Deleted

## 2022-09-01 MED ORDER — FUROSEMIDE 40 MG PO TABS: 40.0000 mg | ORAL_TABLET | Freq: Two times a day (BID) | ORAL | 11 refills | Status: DC

## 2022-09-04 ENCOUNTER — Other Ambulatory Visit: Payer: Self-pay | Admitting: *Deleted

## 2022-09-04 MED ORDER — FUROSEMIDE 40 MG PO TABS: 40.0000 mg | ORAL_TABLET | Freq: Two times a day (BID) | ORAL | 11 refills | Status: DC

## 2022-09-11 ENCOUNTER — Other Ambulatory Visit (HOSPITAL_COMMUNITY): Payer: Self-pay | Admitting: *Deleted

## 2022-09-11 MED ORDER — FUROSEMIDE 40 MG PO TABS: 40.0000 mg | ORAL_TABLET | Freq: Two times a day (BID) | ORAL | 3 refills | Status: DC

## 2022-09-15 ENCOUNTER — Ambulatory Visit: Payer: Medicare Other | Attending: Internal Medicine

## 2022-09-15 DIAGNOSIS — I442 Atrioventricular block, complete: Secondary | ICD-10-CM | POA: Diagnosis not present

## 2022-09-15 LAB — CUP PACEART REMOTE DEVICE CHECK
Battery Impedance: 1561 Ohm
Battery Remaining Longevity: 48 mo
Battery Voltage: 2.77 V
Brady Statistic AP VP Percent: 39 %
Brady Statistic AP VS Percent: 0 %
Brady Statistic AS VP Percent: 61 %
Brady Statistic AS VS Percent: 0 %
Date Time Interrogation Session: 20240119095854
Implantable Lead Connection Status: 753985
Implantable Lead Connection Status: 753985
Implantable Lead Implant Date: 20140625
Implantable Lead Implant Date: 20140625
Implantable Lead Location: 753859
Implantable Lead Location: 753860
Implantable Lead Model: 5076
Implantable Lead Model: 5076
Implantable Pulse Generator Implant Date: 20140625
Lead Channel Impedance Value: 482 Ohm
Lead Channel Impedance Value: 505 Ohm
Lead Channel Pacing Threshold Amplitude: 0.625 V
Lead Channel Pacing Threshold Amplitude: 0.625 V
Lead Channel Pacing Threshold Pulse Width: 0.4 ms
Lead Channel Pacing Threshold Pulse Width: 0.4 ms
Lead Channel Setting Pacing Amplitude: 1.5 V
Lead Channel Setting Pacing Amplitude: 2 V
Lead Channel Setting Pacing Pulse Width: 0.4 ms
Lead Channel Setting Sensing Sensitivity: 2 mV
Zone Setting Status: 755011
Zone Setting Status: 755011

## 2022-09-19 ENCOUNTER — Other Ambulatory Visit (HOSPITAL_COMMUNITY): Payer: Self-pay | Admitting: Internal Medicine

## 2022-09-25 ENCOUNTER — Encounter (INDEPENDENT_AMBULATORY_CARE_PROVIDER_SITE_OTHER): Payer: Medicare Other | Admitting: Ophthalmology

## 2022-09-28 DIAGNOSIS — H35351 Cystoid macular degeneration, right eye: Secondary | ICD-10-CM | POA: Diagnosis not present

## 2022-09-28 DIAGNOSIS — H35371 Puckering of macula, right eye: Secondary | ICD-10-CM | POA: Diagnosis not present

## 2022-10-02 NOTE — Progress Notes (Signed)
Remote pacemaker transmission.   

## 2022-12-15 ENCOUNTER — Ambulatory Visit (INDEPENDENT_AMBULATORY_CARE_PROVIDER_SITE_OTHER): Payer: Medicare Other

## 2022-12-15 DIAGNOSIS — I442 Atrioventricular block, complete: Secondary | ICD-10-CM | POA: Diagnosis not present

## 2022-12-15 LAB — CUP PACEART REMOTE DEVICE CHECK
Battery Impedance: 1703 Ohm
Battery Remaining Longevity: 44 mo
Battery Voltage: 2.77 V
Brady Statistic AP VP Percent: 38 %
Brady Statistic AP VS Percent: 0 %
Brady Statistic AS VP Percent: 61 %
Brady Statistic AS VS Percent: 0 %
Date Time Interrogation Session: 20240419090924
Implantable Lead Connection Status: 753985
Implantable Lead Connection Status: 753985
Implantable Lead Implant Date: 20140625
Implantable Lead Implant Date: 20140625
Implantable Lead Location: 753859
Implantable Lead Location: 753860
Implantable Lead Model: 5076
Implantable Lead Model: 5076
Implantable Pulse Generator Implant Date: 20140625
Lead Channel Impedance Value: 469 Ohm
Lead Channel Impedance Value: 478 Ohm
Lead Channel Pacing Threshold Amplitude: 0.625 V
Lead Channel Pacing Threshold Amplitude: 0.75 V
Lead Channel Pacing Threshold Pulse Width: 0.4 ms
Lead Channel Pacing Threshold Pulse Width: 0.4 ms
Lead Channel Setting Pacing Amplitude: 1.5 V
Lead Channel Setting Pacing Amplitude: 2 V
Lead Channel Setting Pacing Pulse Width: 0.4 ms
Lead Channel Setting Sensing Sensitivity: 2 mV
Zone Setting Status: 755011
Zone Setting Status: 755011

## 2023-01-11 DIAGNOSIS — E119 Type 2 diabetes mellitus without complications: Secondary | ICD-10-CM | POA: Diagnosis not present

## 2023-01-11 DIAGNOSIS — Z961 Presence of intraocular lens: Secondary | ICD-10-CM | POA: Diagnosis not present

## 2023-01-11 DIAGNOSIS — H532 Diplopia: Secondary | ICD-10-CM | POA: Diagnosis not present

## 2023-01-12 NOTE — Progress Notes (Signed)
Remote pacemaker transmission.   

## 2023-02-05 ENCOUNTER — Other Ambulatory Visit (HOSPITAL_COMMUNITY): Payer: Self-pay

## 2023-02-27 DIAGNOSIS — E538 Deficiency of other specified B group vitamins: Secondary | ICD-10-CM | POA: Diagnosis not present

## 2023-02-27 DIAGNOSIS — E1122 Type 2 diabetes mellitus with diabetic chronic kidney disease: Secondary | ICD-10-CM | POA: Diagnosis not present

## 2023-02-27 DIAGNOSIS — Z953 Presence of xenogenic heart valve: Secondary | ICD-10-CM | POA: Diagnosis not present

## 2023-02-27 DIAGNOSIS — Z0000 Encounter for general adult medical examination without abnormal findings: Secondary | ICD-10-CM | POA: Diagnosis not present

## 2023-02-27 DIAGNOSIS — E782 Mixed hyperlipidemia: Secondary | ICD-10-CM | POA: Diagnosis not present

## 2023-02-27 DIAGNOSIS — Z79899 Other long term (current) drug therapy: Secondary | ICD-10-CM | POA: Diagnosis not present

## 2023-02-27 DIAGNOSIS — N1831 Chronic kidney disease, stage 3a: Secondary | ICD-10-CM | POA: Diagnosis not present

## 2023-03-08 ENCOUNTER — Ambulatory Visit (HOSPITAL_BASED_OUTPATIENT_CLINIC_OR_DEPARTMENT_OTHER)
Admission: RE | Admit: 2023-03-08 | Discharge: 2023-03-08 | Disposition: A | Discharge status: Home / Self Care | Payer: Medicare Other | Source: Ambulatory Visit | Attending: Internal Medicine | Admitting: Internal Medicine

## 2023-03-08 ENCOUNTER — Ambulatory Visit (HOSPITAL_COMMUNITY)
Admission: RE | Admit: 2023-03-08 | Discharge: 2023-03-08 | Disposition: A | Discharge status: Home / Self Care | Payer: Medicare Other | Source: Ambulatory Visit | Attending: Internal Medicine | Admitting: Internal Medicine

## 2023-03-08 ENCOUNTER — Encounter (HOSPITAL_COMMUNITY): Payer: Self-pay | Admitting: Internal Medicine

## 2023-03-08 VITALS — BP 110/70 | HR 63 | Wt 115.6 lb

## 2023-03-08 DIAGNOSIS — I509 Heart failure, unspecified: Secondary | ICD-10-CM | POA: Insufficient documentation

## 2023-03-08 DIAGNOSIS — Z952 Presence of prosthetic heart valve: Secondary | ICD-10-CM

## 2023-03-08 DIAGNOSIS — Z953 Presence of xenogenic heart valve: Secondary | ICD-10-CM | POA: Diagnosis not present

## 2023-03-08 DIAGNOSIS — I272 Pulmonary hypertension, unspecified: Secondary | ICD-10-CM | POA: Insufficient documentation

## 2023-03-08 DIAGNOSIS — I442 Atrioventricular block, complete: Secondary | ICD-10-CM | POA: Diagnosis not present

## 2023-03-08 DIAGNOSIS — E119 Type 2 diabetes mellitus without complications: Secondary | ICD-10-CM | POA: Insufficient documentation

## 2023-03-08 DIAGNOSIS — I5032 Chronic diastolic (congestive) heart failure: Secondary | ICD-10-CM

## 2023-03-08 DIAGNOSIS — E785 Hyperlipidemia, unspecified: Secondary | ICD-10-CM | POA: Diagnosis not present

## 2023-03-08 DIAGNOSIS — E46 Unspecified protein-calorie malnutrition: Secondary | ICD-10-CM | POA: Insufficient documentation

## 2023-03-08 DIAGNOSIS — I359 Nonrheumatic aortic valve disorder, unspecified: Secondary | ICD-10-CM | POA: Diagnosis not present

## 2023-03-08 DIAGNOSIS — Z950 Presence of cardiac pacemaker: Secondary | ICD-10-CM | POA: Insufficient documentation

## 2023-03-08 LAB — ECHOCARDIOGRAM COMPLETE
AV Mean grad: 12 mmHg
AV Peak grad: 17.3 mmHg
Ao pk vel: 2.08 m/s
Area-P 1/2: 2.35 cm2
Calc EF: 69.6 %
MV M vel: 2.01 m/s
MV Peak grad: 16.1 mmHg
S' Lateral: 2.7 cm
Single Plane A2C EF: 66.2 %
Single Plane A4C EF: 70.9 %

## 2023-03-08 NOTE — Progress Notes (Signed)
CARDIOLOGY CLINIC NOTE  Patient ID: [name], male   DOB: Mar 02, 1941, 82 y.o.   MRN: [id number]  PCP: Dr. Carey Bullocks is 82 y/o male with h/o AS s/p bioprosthetic AVR (01/2013), DM2, CHB (s/p pacemaker) and hyperlipidemia. Bioprosthetic AVR with ligation of diagonal-PA fistula by Dr. Laneta Simmers on 02/17/13. He had post-operative complete heart block. A Medtronic dual chamber pacemaker was placed 02/19/13.   Pre-op Cath with normal coronaries 4/14  Here for f/u. Doing well. Denies CP or SOB. Mild LE edema.   Echo today 03/08/23  EF 60-65% AVR gradient 12   Following with Dr. Graciela Husbands for his pacer.   Echo 5/22 55-60% AVR stable mean gradient 7 triv Ai   Echo 10/19 EF 55-60% AVR stable Echo 9/17: EF 60% AVR stable   ROS: All systems negative except as listed in HPI, PMH and Problem List.  SH: Married, lives in [place], nonsmoker, works at the [employer]   Past Medical History  Diagnosis Date   Aortic stenosis     moderate. Echo 3/10: EF 55-60%. mean gradient 25 mmHg. AVA 1.23cm2. Echo 3/11: Normal EF. mean AVA gradient . exercise myoview 2008: positive ECG. normal perfusion.  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   Chronic lymphocytic leukemia     followed by Dr. Candyce Churn  - CHB post-op AoV surgery.  Medtronic dual chamber PCM.   Current Outpatient Medications  Medication Sig Dispense Refill   acetaminophen (TYLENOL) 500 MG tablet Take 1,000 mg by mouth in the morning, at noon, in the evening, and at bedtime.     aspirin 81 MG tablet Take 1 tablet (81 mg total) by mouth daily. 30 tablet 11   busPIRone  (BUSPAR) 15 MG tablet Take 15-30 mg by mouth See admin instructions. Take 15 mg by mouth every morning and 30 mg every day at noon     Cholecalciferol (VITAMIN D) 2000 units tablet Take 2,000 Units by mouth every other day.      cycloSPORINE (RESTASIS) 0.05 % ophthalmic emulsion Place 1 drop into both eyes 2 (two) times daily.     fenofibrate 160 MG tablet Take 160 mg by mouth daily.     FORA V12 BLOOD GLUCOSE TEST test strip      furosemide (LASIX) 40 MG tablet Take 1 tablet (40 mg total) by mouth 2 (two) times daily. 180 tablet 3   gabapentin (NEURONTIN) 300 MG capsule Take 300 mg by mouth 2 (two) times daily.     LITETOUCH LANCETS MISC      montelukast (SINGULAIR) 10 MG tablet Take 10 mg by mouth daily.     Multiple Vitamin (MULTIVITAMIN WITH MINERALS) TABS tablet Take 1 tablet by mouth daily.     Polyethyl Glycol-Propyl Glycol (SYSTANE OP) Place 1 drop into both eyes 2 (two) times daily.     potassium chloride SA (K-DUR,KLOR-CON) 20 MEQ tablet TAKE ONE TABLET EVERY DAY 90 tablet 3   Probiotic Product (PHILLIPS COLON HEALTH PO) Take 1 capsule by  mouth daily.     sertraline (ZOLOFT) 100 MG tablet Take 100 mg by mouth daily.     traZODone (DESYREL) 50 MG tablet Take 150 mg by mouth at bedtime.     No current facility-administered medications for this encounter.     PHYSICAL EXAM: Vitals:   03/08/23 1009  BP: 110/70  Pulse: 63  SpO2: 99%  Weight: 52.4 kg (115 lb 9.6 oz)   General:  Well appearing. No resp difficulty HEENT: normal Neck: supple. no JVD. Carotids 2+ bilat; no bruits. No lymphadenopathy or thryomegaly appreciated. Cor: PMI nondisplaced. Regular rate & rhythm. 2/6 AS Lungs: clear Abdomen: soft, nontender, nondistended. No hepatosplenomegaly. No bruits or masses. Good bowel sounds. Extremities: no cyanosis, clubbing, rash, tr edema Neuro: alert & orientedx3, cranial nerves grossly intact. moves all 4 extremities w/o difficulty. Affect pleasant  ECG AV paced Personally  reviewed  ASSESSMENT & PLAN: 1. Status post bioprosthetic AVR: - Stable valve on echo. Compliant with SBE prophylaxis - Echo 5/22 looks good.  - Echo today 03/08/23  EF 60-65% AVR gradient 12 (up from 7)  - Repeat echo 1 year - Continue SBE prophylaxis 2. Complete heart block: Medtronic PCM.  Followed by Dr. Graciela Husbands.  - Followed by Dr. Graciela Husbands 3. Diagonal-PA fistula: Ligated at time of AVR.  4. HL -  Per PCP  Arvilla Meres MD 03/08/2023

## 2023-03-08 NOTE — Patient Instructions (Signed)
  Follow-Up in: 12 MONTHS WITH ECHOCARDIOGRAM RIGHT BEFORE WITH DR BENSIMHON- PLEASE CALL OUR OFFICE AROUND MAY 2025 TO GET SCHEDULED FOR YOUR APPOINTMENT. PHONE NUMBER IS [phone] OPTION 2   At the [facility], you and your health needs are our priority. We have a designated team specialized in the treatment of Heart Failure. This Care Team includes your primary Heart Failure Specialized Cardiologist (physician), Advanced Practice Providers (APPs- Physician Assistants and Nurse Practitioners), and Pharmacist who all work together to provide you with the care you need, when you need it.   You may see any of the following providers on your designated Care Team at your next follow up:  Dr. Arvilla Meres Dr. Marca Ancona Dr. Marcos Eke, NP Robbie Lis, [place] [facility] [place], [place] Brynda Peon, NP Karle Plumber, PharmD   Please be sure to bring in all your medications bottles to every appointment.   Need to Contact us:  If you have any questions or concerns before your next appointment please send [place] a message through [place] or call our office at [phone].    TO LEAVE A MESSAGE FOR THE NURSE SELECT OPTION 2, PLEASE LEAVE A MESSAGE INCLUDING: YOUR NAME DATE OF BIRTH CALL BACK NUMBER REASON FOR CALL**this is important as we prioritize the call backs  YOU WILL RECEIVE A CALL BACK THE SAME DAY AS LONG AS YOU CALL BEFORE 4:00 PM

## 2023-03-08 NOTE — Progress Notes (Signed)
  Echocardiogram 2D Echocardiogram has been performed.  [name] 03/08/2023, 10:05 AM

## 2023-03-16 ENCOUNTER — Ambulatory Visit (INDEPENDENT_AMBULATORY_CARE_PROVIDER_SITE_OTHER): Payer: Medicare Other

## 2023-03-16 DIAGNOSIS — I442 Atrioventricular block, complete: Secondary | ICD-10-CM | POA: Diagnosis not present

## 2023-03-19 ENCOUNTER — Other Ambulatory Visit (HOSPITAL_COMMUNITY): Payer: Self-pay | Admitting: Internal Medicine

## 2023-03-20 LAB — CUP PACEART REMOTE DEVICE CHECK
Battery Impedance: 1701 Ohm
Battery Remaining Longevity: 44 mo
Battery Voltage: 2.77 V
Brady Statistic AP VP Percent: 39 %
Brady Statistic AP VS Percent: 0 %
Brady Statistic AS VP Percent: 61 %
Brady Statistic AS VS Percent: 0 %
Date Time Interrogation Session: 20240719091928
Implantable Lead Connection Status: 753985
Implantable Lead Connection Status: 753985
Implantable Lead Implant Date: 20140625
Implantable Lead Implant Date: 20140625
Implantable Lead Location: 753859
Implantable Lead Location: 753860
Implantable Lead Model: 5076
Implantable Lead Model: 5076
Implantable Pulse Generator Implant Date: 20140625
Lead Channel Impedance Value: 427 Ohm
Lead Channel Impedance Value: 468 Ohm
Lead Channel Pacing Threshold Amplitude: 0.5 V
Lead Channel Pacing Threshold Amplitude: 0.75 V
Lead Channel Pacing Threshold Pulse Width: 0.4 ms
Lead Channel Pacing Threshold Pulse Width: 0.4 ms
Lead Channel Setting Pacing Amplitude: 1.5 V
Lead Channel Setting Pacing Amplitude: 2 V
Lead Channel Setting Pacing Pulse Width: 0.4 ms
Lead Channel Setting Sensing Sensitivity: 2 mV
Zone Setting Status: 755011
Zone Setting Status: 755011

## 2023-03-29 NOTE — Progress Notes (Signed)
Remote pacemaker transmission.   

## 2023-04-10 DIAGNOSIS — H5000 Unspecified esotropia: Secondary | ICD-10-CM | POA: Diagnosis not present

## 2023-04-10 DIAGNOSIS — H532 Diplopia: Secondary | ICD-10-CM | POA: Diagnosis not present

## 2023-04-26 DIAGNOSIS — H02409 Unspecified ptosis of unspecified eyelid: Secondary | ICD-10-CM | POA: Diagnosis not present

## 2023-06-15 ENCOUNTER — Ambulatory Visit: Payer: Medicare Other

## 2023-07-05 ENCOUNTER — Other Ambulatory Visit: Payer: Self-pay | Admitting: Internal Medicine

## 2023-07-05 DIAGNOSIS — K6389 Other specified diseases of intestine: Secondary | ICD-10-CM

## 2023-07-16 ENCOUNTER — Encounter: Payer: Self-pay | Admitting: Internal Medicine

## 2023-07-17 DIAGNOSIS — H532 Diplopia: Secondary | ICD-10-CM | POA: Diagnosis not present

## 2023-07-17 DIAGNOSIS — H5000 Unspecified esotropia: Secondary | ICD-10-CM | POA: Diagnosis not present

## 2023-07-19 DIAGNOSIS — L570 Actinic keratosis: Secondary | ICD-10-CM | POA: Diagnosis not present

## 2023-07-19 DIAGNOSIS — L905 Scar conditions and fibrosis of skin: Secondary | ICD-10-CM | POA: Diagnosis not present

## 2023-07-19 DIAGNOSIS — D2261 Melanocytic nevi of right upper limb, including shoulder: Secondary | ICD-10-CM | POA: Diagnosis not present

## 2023-07-19 DIAGNOSIS — Z85828 Personal history of other malignant neoplasm of skin: Secondary | ICD-10-CM | POA: Diagnosis not present

## 2023-07-19 DIAGNOSIS — D2262 Melanocytic nevi of left upper limb, including shoulder: Secondary | ICD-10-CM | POA: Diagnosis not present

## 2023-07-19 DIAGNOSIS — D225 Melanocytic nevi of trunk: Secondary | ICD-10-CM | POA: Diagnosis not present

## 2023-07-19 DIAGNOSIS — L821 Other seborrheic keratosis: Secondary | ICD-10-CM | POA: Diagnosis not present

## 2023-07-31 ENCOUNTER — Ambulatory Visit
Admission: RE | Admit: 2023-07-31 | Discharge: 2023-07-31 | Disposition: A | Discharge status: Home / Self Care | Payer: Medicare Other | Source: Ambulatory Visit | Attending: Internal Medicine | Admitting: Internal Medicine

## 2023-07-31 ENCOUNTER — Other Ambulatory Visit: Payer: Self-pay | Admitting: Internal Medicine

## 2023-07-31 DIAGNOSIS — K6389 Other specified diseases of intestine: Secondary | ICD-10-CM | POA: Diagnosis not present

## 2023-07-31 MED ORDER — IOPAMIDOL (ISOVUE-300) INJECTION 61%
100.0000 mL | Freq: Once | INTRAVENOUS | Status: AC | PRN
Start: 2023-07-31 — End: 2023-07-31
  Administered 2023-07-31: 100 mL via INTRAVENOUS

## 2023-08-06 DIAGNOSIS — C7A8 Other malignant neuroendocrine tumors: Secondary | ICD-10-CM | POA: Diagnosis not present

## 2023-08-06 DIAGNOSIS — C7B8 Other secondary neuroendocrine tumors: Secondary | ICD-10-CM | POA: Diagnosis not present

## 2023-08-10 ENCOUNTER — Other Ambulatory Visit (HOSPITAL_COMMUNITY): Payer: Self-pay | Admitting: Internal Medicine

## 2023-08-13 DIAGNOSIS — D3A8 Other benign neuroendocrine tumors: Secondary | ICD-10-CM | POA: Diagnosis not present

## 2023-08-13 DIAGNOSIS — K6389 Other specified diseases of intestine: Secondary | ICD-10-CM | POA: Diagnosis not present

## 2023-08-13 DIAGNOSIS — C7B8 Other secondary neuroendocrine tumors: Secondary | ICD-10-CM | POA: Diagnosis not present

## 2023-08-13 DIAGNOSIS — C7A8 Other malignant neuroendocrine tumors: Secondary | ICD-10-CM | POA: Diagnosis not present

## 2023-08-23 DIAGNOSIS — R064 Hyperventilation: Secondary | ICD-10-CM | POA: Diagnosis not present

## 2023-08-23 DIAGNOSIS — J189 Pneumonia, unspecified organism: Secondary | ICD-10-CM | POA: Diagnosis not present

## 2023-08-24 ENCOUNTER — Other Ambulatory Visit: Payer: Self-pay | Admitting: Family Medicine

## 2023-08-24 ENCOUNTER — Ambulatory Visit
Admission: RE | Admit: 2023-08-24 | Discharge: 2023-08-24 | Disposition: A | Discharge status: Home / Self Care | Payer: Medicare Other | Source: Ambulatory Visit | Attending: Family Medicine | Admitting: Family Medicine

## 2023-08-24 DIAGNOSIS — J189 Pneumonia, unspecified organism: Secondary | ICD-10-CM

## 2023-08-24 DIAGNOSIS — J439 Emphysema, unspecified: Secondary | ICD-10-CM | POA: Diagnosis not present

## 2023-08-30 DIAGNOSIS — R062 Wheezing: Secondary | ICD-10-CM | POA: Diagnosis not present

## 2023-08-30 DIAGNOSIS — J438 Other emphysema: Secondary | ICD-10-CM | POA: Diagnosis not present

## 2023-09-14 ENCOUNTER — Ambulatory Visit: Payer: Medicare Other

## 2023-09-14 DIAGNOSIS — E1122 Type 2 diabetes mellitus with diabetic chronic kidney disease: Secondary | ICD-10-CM | POA: Diagnosis not present

## 2023-09-14 DIAGNOSIS — I442 Atrioventricular block, complete: Secondary | ICD-10-CM

## 2023-09-14 DIAGNOSIS — E782 Mixed hyperlipidemia: Secondary | ICD-10-CM | POA: Diagnosis not present

## 2023-09-14 DIAGNOSIS — Z856 Personal history of leukemia: Secondary | ICD-10-CM | POA: Diagnosis not present

## 2023-09-20 LAB — CUP PACEART REMOTE DEVICE CHECK
Battery Impedance: 1907 Ohm
Battery Remaining Longevity: 40 mo
Battery Voltage: 2.76 V
Brady Statistic AP VP Percent: 39 %
Brady Statistic AP VS Percent: 0 %
Brady Statistic AS VP Percent: 61 %
Brady Statistic AS VS Percent: 0 %
Date Time Interrogation Session: 20250122201057
Implantable Lead Connection Status: 753985
Implantable Lead Connection Status: 753985
Implantable Lead Implant Date: 20140625
Implantable Lead Implant Date: 20140625
Implantable Lead Location: 753859
Implantable Lead Location: 753860
Implantable Lead Model: 5076
Implantable Lead Model: 5076
Implantable Pulse Generator Implant Date: 20140625
Lead Channel Impedance Value: 499 Ohm
Lead Channel Impedance Value: 519 Ohm
Lead Channel Pacing Threshold Amplitude: 0.5 V
Lead Channel Pacing Threshold Amplitude: 0.75 V
Lead Channel Pacing Threshold Pulse Width: 0.4 ms
Lead Channel Pacing Threshold Pulse Width: 0.4 ms
Lead Channel Setting Pacing Amplitude: 1.5 V
Lead Channel Setting Pacing Amplitude: 2 V
Lead Channel Setting Pacing Pulse Width: 0.4 ms
Lead Channel Setting Sensing Sensitivity: 2 mV
Zone Setting Status: 755011
Zone Setting Status: 755011

## 2023-10-14 ENCOUNTER — Other Ambulatory Visit (HOSPITAL_COMMUNITY): Payer: Self-pay | Admitting: Internal Medicine

## 2023-10-19 NOTE — Progress Notes (Signed)
 Remote pacemaker transmission.

## 2023-10-19 NOTE — Addendum Note (Signed)
Addended by: Elease Etienne A on: 10/19/2023 04:22 PM   Modules accepted: Orders

## 2023-10-24 ENCOUNTER — Encounter: Payer: Self-pay | Admitting: Internal Medicine

## 2023-12-14 ENCOUNTER — Ambulatory Visit (INDEPENDENT_AMBULATORY_CARE_PROVIDER_SITE_OTHER): Payer: Medicare Other

## 2023-12-14 DIAGNOSIS — I442 Atrioventricular block, complete: Secondary | ICD-10-CM

## 2023-12-17 LAB — CUP PACEART REMOTE DEVICE CHECK
Battery Impedance: 1947 Ohm
Battery Remaining Longevity: 39 mo
Battery Voltage: 2.76 V
Brady Statistic AP VP Percent: 39 %
Brady Statistic AP VS Percent: 0 %
Brady Statistic AS VP Percent: 61 %
Brady Statistic AS VS Percent: 0 %
Date Time Interrogation Session: 20250418092400
Implantable Lead Connection Status: 753985
Implantable Lead Connection Status: 753985
Implantable Lead Implant Date: 20140625
Implantable Lead Implant Date: 20140625
Implantable Lead Location: 753859
Implantable Lead Location: 753860
Implantable Lead Model: 5076
Implantable Lead Model: 5076
Implantable Pulse Generator Implant Date: 20140625
Lead Channel Impedance Value: 471 Ohm
Lead Channel Impedance Value: 492 Ohm
Lead Channel Pacing Threshold Amplitude: 0.625 V
Lead Channel Pacing Threshold Amplitude: 0.75 V
Lead Channel Pacing Threshold Pulse Width: 0.4 ms
Lead Channel Pacing Threshold Pulse Width: 0.4 ms
Lead Channel Setting Pacing Amplitude: 1.5 V
Lead Channel Setting Pacing Amplitude: 2 V
Lead Channel Setting Pacing Pulse Width: 0.4 ms
Lead Channel Setting Sensing Sensitivity: 2 mV
Zone Setting Status: 755011
Zone Setting Status: 755011

## 2024-01-17 DIAGNOSIS — Z961 Presence of intraocular lens: Secondary | ICD-10-CM | POA: Diagnosis not present

## 2024-01-17 DIAGNOSIS — H35372 Puckering of macula, left eye: Secondary | ICD-10-CM | POA: Diagnosis not present

## 2024-01-17 DIAGNOSIS — H532 Diplopia: Secondary | ICD-10-CM | POA: Diagnosis not present

## 2024-01-23 NOTE — Addendum Note (Signed)
 Addended by: Lott Rouleau A on: 01/23/2024 09:03 AM   Modules accepted: Orders

## 2024-01-23 NOTE — Progress Notes (Signed)
 Remote pacemaker transmission.

## 2024-03-04 ENCOUNTER — Other Ambulatory Visit (HOSPITAL_COMMUNITY): Payer: Self-pay | Admitting: *Deleted

## 2024-03-04 DIAGNOSIS — Z952 Presence of prosthetic heart valve: Secondary | ICD-10-CM

## 2024-03-11 DIAGNOSIS — E1122 Type 2 diabetes mellitus with diabetic chronic kidney disease: Secondary | ICD-10-CM | POA: Diagnosis not present

## 2024-03-11 DIAGNOSIS — E782 Mixed hyperlipidemia: Secondary | ICD-10-CM | POA: Diagnosis not present

## 2024-03-11 DIAGNOSIS — Z0000 Encounter for general adult medical examination without abnormal findings: Secondary | ICD-10-CM | POA: Diagnosis not present

## 2024-03-11 DIAGNOSIS — Z23 Encounter for immunization: Secondary | ICD-10-CM | POA: Diagnosis not present

## 2024-03-11 DIAGNOSIS — Z79899 Other long term (current) drug therapy: Secondary | ICD-10-CM | POA: Diagnosis not present

## 2024-03-11 DIAGNOSIS — E538 Deficiency of other specified B group vitamins: Secondary | ICD-10-CM | POA: Diagnosis not present

## 2024-03-14 ENCOUNTER — Ambulatory Visit: Payer: Medicare Other

## 2024-03-14 DIAGNOSIS — I442 Atrioventricular block, complete: Secondary | ICD-10-CM

## 2024-03-18 LAB — CUP PACEART REMOTE DEVICE CHECK
Battery Impedance: 2013 Ohm
Battery Remaining Longevity: 38 mo
Battery Voltage: 2.75 V
Brady Statistic AP VP Percent: 39 %
Brady Statistic AP VS Percent: 0 %
Brady Statistic AS VP Percent: 61 %
Brady Statistic AS VS Percent: 0 %
Date Time Interrogation Session: 20250718092340
Implantable Lead Connection Status: 753985
Implantable Lead Connection Status: 753985
Implantable Lead Implant Date: 20140625
Implantable Lead Implant Date: 20140625
Implantable Lead Location: 753859
Implantable Lead Location: 753860
Implantable Lead Model: 5076
Implantable Lead Model: 5076
Implantable Pulse Generator Implant Date: 20140625
Lead Channel Impedance Value: 489 Ohm
Lead Channel Impedance Value: 491 Ohm
Lead Channel Pacing Threshold Amplitude: 0.625 V
Lead Channel Pacing Threshold Amplitude: 0.75 V
Lead Channel Pacing Threshold Pulse Width: 0.4 ms
Lead Channel Pacing Threshold Pulse Width: 0.4 ms
Lead Channel Setting Pacing Amplitude: 1.5 V
Lead Channel Setting Pacing Amplitude: 2 V
Lead Channel Setting Pacing Pulse Width: 0.4 ms
Lead Channel Setting Sensing Sensitivity: 2 mV
Zone Setting Status: 755011
Zone Setting Status: 755011

## 2024-03-27 ENCOUNTER — Other Ambulatory Visit (HOSPITAL_BASED_OUTPATIENT_CLINIC_OR_DEPARTMENT_OTHER): Payer: Self-pay

## 2024-03-27 ENCOUNTER — Other Ambulatory Visit (HOSPITAL_COMMUNITY): Payer: Self-pay

## 2024-03-27 MED ORDER — CAPVAXIVE 0.5 ML IM SOSY
PREFILLED_SYRINGE | INTRAMUSCULAR | 0 refills | Status: AC
  Filled 2024-03-27: qty 0.5, 1d supply, fill #0

## 2024-05-22 ENCOUNTER — Ambulatory Visit (HOSPITAL_COMMUNITY)
Admission: RE | Admit: 2024-05-22 | Discharge: 2024-05-22 | Disposition: A | Discharge status: Home / Self Care | Source: Ambulatory Visit | Attending: Internal Medicine | Admitting: Internal Medicine

## 2024-05-22 ENCOUNTER — Ambulatory Visit (HOSPITAL_BASED_OUTPATIENT_CLINIC_OR_DEPARTMENT_OTHER)
Admission: RE | Admit: 2024-05-22 | Discharge: 2024-05-22 | Disposition: A | Discharge status: Home / Self Care | Source: Ambulatory Visit | Attending: Internal Medicine | Admitting: Internal Medicine

## 2024-05-22 VITALS — BP 160/74 | HR 60 | Wt 117.0 lb

## 2024-05-22 DIAGNOSIS — Z952 Presence of prosthetic heart valve: Secondary | ICD-10-CM | POA: Diagnosis not present

## 2024-05-22 DIAGNOSIS — I442 Atrioventricular block, complete: Secondary | ICD-10-CM

## 2024-05-22 DIAGNOSIS — R0683 Snoring: Secondary | ICD-10-CM | POA: Diagnosis not present

## 2024-05-22 DIAGNOSIS — Z950 Presence of cardiac pacemaker: Secondary | ICD-10-CM | POA: Diagnosis not present

## 2024-05-22 DIAGNOSIS — I5032 Chronic diastolic (congestive) heart failure: Secondary | ICD-10-CM

## 2024-05-22 DIAGNOSIS — I493 Ventricular premature depolarization: Secondary | ICD-10-CM

## 2024-05-22 DIAGNOSIS — I10 Essential (primary) hypertension: Secondary | ICD-10-CM | POA: Insufficient documentation

## 2024-05-22 DIAGNOSIS — R5383 Other fatigue: Secondary | ICD-10-CM | POA: Insufficient documentation

## 2024-05-22 DIAGNOSIS — E119 Type 2 diabetes mellitus without complications: Secondary | ICD-10-CM | POA: Insufficient documentation

## 2024-05-22 DIAGNOSIS — I071 Rheumatic tricuspid insufficiency: Secondary | ICD-10-CM | POA: Diagnosis not present

## 2024-05-22 LAB — ECHOCARDIOGRAM COMPLETE
AR max vel: 1.05 cm2
AV Area VTI: 1.12 cm2
AV Area mean vel: 1.05 cm2
AV Mean grad: 6.7 mmHg
AV Peak grad: 12.8 mmHg
Ao pk vel: 1.79 m/s
Area-P 1/2: 2.99 cm2
S' Lateral: 3.04 cm

## 2024-05-22 NOTE — Progress Notes (Signed)
 ITAMAR home sleep study given to patient, all instructions explained, waiver signed, and CLOUDPAT registration complete.  Height:     Weight: BMI:  Today's Date:  STOP BANG RISK ASSESSMENT S (snore) Have you been told that you snore?     YES   T (tired) Are you often tired, fatigued, or sleepy during the day?   YES  O (obstruction) Do you stop breathing, choke, or gasp during sleep? NO   P (pressure) Do you have or are you being treated for high blood pressure? YES   B (BMI) Is your body index greater than 35 kg/m? NO   A (age) Are you 84 years old or older? YES   N (neck) Do you have a neck circumference greater than 16 inches?   NO   G (gender) Are you a male? YES   TOTAL STOP/BANG "YES" ANSWERS 5                                                                       For Office Use Only              Procedure Order Form    YES to 3+ Stop Bang questions OR two clinical symptoms - patient qualifies for WatchPAT (CPT 95800)      Clinical Notes: Will consult Sleep Specialist and refer for management of therapy due to patient increased risk of Sleep Apnea. Ordering a sleep study due to the following two clinical symptoms: Excessive daytime sleepiness G47.10 / Gastroesophageal reflux K21.9 / Nocturia R35.1 / Morning Headaches G44.221 / Difficulty concentrating R41.840 / Memory problems or poor judgment G31.84 / Personality changes or irritability R45.4 / Loud snoring R06.83 / Depression F32.9 / Unrefreshed by sleep G47.8 / Impotence N52.9 / History of high blood pressure R03.0 / Insomnia G47.00

## 2024-05-22 NOTE — Patient Instructions (Signed)
 Medication Changes:  No Changes In Medications at this time.   Testing/Procedures:  Your provider has recommended that you have a home sleep study (Itamar Test).  We have provided you with the equipment in our office today. Please go ahead and download the app. DO NOT OPEN OR TAMPER WITH THE BOX UNTIL WE ADVISE YOU TO DO SO. Once insurance has approved the test our office will call you with PIN number and approval to proceed with testing. Once you have completed the test you just dispose of the equipment, the information is automatically uploaded to us  via blue-tooth technology. If your test is positive for sleep apnea and you need a home CPAP machine you will be contacted by Dr Dorine office [facility]) to set this up.   Special Instructions // Education:  IF YOUR BLOOD PRESSURE IS GREATER THAN 150 ON THE TOP NUMBER PLEASE CALL THE CLINIC  Follow-Up in: 12 MONTHS WITH AN ECHO PLEASE CALL OUR OFFICE AROUND AUGUST 2026 TO GET SCHEDULED FOR YOUR APPOINTMENT. PHONE NUMBER IS [phone] OPTION 2    At the [facility], you and your health needs are our priority. We have a designated team specialized in the treatment of Heart Failure. This Care Team includes your primary Heart Failure Specialized Cardiologist (physician), Advanced Practice Providers (APPs- Physician Assistants and Nurse Practitioners), and Pharmacist who all work together to provide you with the care you need, when you need it.   You may see any of the following providers on your designated Care Team at your next follow up:  Dr. Toribio Fuel Dr. Ezra Shuck Dr. Ria Commander Dr. Odis Brownie Greig Mosses, NP Caffie Shed, [place] [facility] [place], [place] Beckey Coe, NP [place] Lee, NP Tinnie Redman, PharmD   Please be sure to bring in all your medications bottles to every appointment.   Need to Contact Us :  If you have any questions or concerns before your next appointment please  send us  a message through [place] or call our office at [phone].    TO LEAVE A MESSAGE FOR THE NURSE SELECT OPTION 2, PLEASE LEAVE A MESSAGE INCLUDING: YOUR NAME DATE OF BIRTH CALL BACK NUMBER REASON FOR CALL**this is important as we prioritize the call backs  YOU WILL RECEIVE A CALL BACK THE SAME DAY AS LONG AS YOU CALL BEFORE 4:00 PM

## 2024-05-22 NOTE — Addendum Note (Signed)
 Encounter addended by: Shelsie Tijerino R, MD on: 05/22/2024 9:44 AM  Actions taken: Charge Capture section accepted

## 2024-05-22 NOTE — Progress Notes (Signed)
  Echocardiogram 2D Echocardiogram has been performed.  Koleen KANDICE Popper, RDCS 05/22/2024, 8:47 AM

## 2024-05-22 NOTE — Progress Notes (Signed)
 CARDIOLOGY CLINIC NOTE  Patient ID: [name], male   DOB: 1941/07/09, 83 y.o.   MRN: [id number]  PCP: Dr. Verena Simas is 83 y/o male with h/o AS s/p bioprosthetic AVR (01/2013), DM2, CHB (s/p pacemaker) and hyperlipidemia. Bioprosthetic AVR with ligation of diagonal-PA fistula by Dr. Lucas on 02/17/13. He had post-operative complete heart block. A Medtronic dual chamber pacemaker was placed 02/19/13.   Pre-op Cath with normal coronaries 4/14   Echo 03/08/23  EF 60-65% AVR gradient 12   Here for f/u. Doing well. Still PT at [facility]. Denies CP or SOB.    Echo 05/22/24 EF 55-60% AVR ok Mean grad Personally reviewed  PPM interogation 60.5% AS-VP 39.4% AP-VP PVCs 241K    ROS: All systems negative except as listed in HPI, PMH and Problem List.  SH: Married, lives in [place], nonsmoker, works at the [employer]   Past Medical History  Diagnosis Date   Aortic stenosis     moderate. Echo 3/10: EF 55-60%. mean gradient 25 mmHg. AVA 1.23cm2. Echo 3/11: Normal EF. mean AVA gradient . exercise myoview 2008: positive ECG. normal perfusion.  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   Chronic lymphocytic leukemia     followed by Dr. Pandora  - CHB post-op AoV surgery.  Medtronic dual chamber PCM.   Current Outpatient Medications  Medication Sig Dispense Refill   acetaminophen  (TYLENOL ) 500 MG tablet Take 1,000 mg by mouth in the morning, at noon, in the evening, and at bedtime.     albuterol  (VENTOLIN  HFA) 108 (90 Base) MCG/ACT inhaler Inhale into the lungs every 6 (six) hours as needed for wheezing or  shortness of breath.     aspirin  81 MG tablet Take 1 tablet (81 mg total) by mouth daily. 30 tablet 11   busPIRone  (BUSPAR ) 15 MG tablet Take 15-30 mg by mouth See admin instructions. Take 15 mg by mouth every morning and 30 mg every day at noon     Cholecalciferol (VITAMIN D ) 2000 units tablet Take 2,000 Units by mouth every other day.      cycloSPORINE  (RESTASIS ) 0.05 % ophthalmic emulsion Place 1 drop into both eyes 2 (two) times daily.     fenofibrate  160 MG tablet Take 160 mg by mouth daily.     FORA V12 BLOOD GLUCOSE TEST test strip      furosemide  (LASIX ) 40 MG tablet TAKE 1 TABLET TWICE A DAY 180 tablet 3   gabapentin  (NEURONTIN ) 300 MG capsule Take 300 mg by mouth 2 (two) times daily.     LITETOUCH LANCETS MISC      montelukast  (SINGULAIR ) 10 MG tablet Take 10 mg by mouth daily.     Multiple Vitamin (MULTIVITAMIN WITH MINERALS) TABS tablet Take 1 tablet by mouth daily.     pneumococcal 21-valent conjugate vaccine (CAPVAXIVE ) 0.5 ML injection Inject into the muscle. 0.5 mL 0   Polyethyl Glycol-Propyl Glycol (SYSTANE OP) Place 1 drop into both eyes 2 (two) times daily.  potassium chloride  SA (K-DUR,KLOR-CON ) 20 MEQ tablet TAKE ONE TABLET EVERY DAY 90 tablet 3   Probiotic Product (PHILLIPS COLON HEALTH PO) Take 1 capsule by mouth daily.     sertraline  (ZOLOFT ) 100 MG tablet Take 100 mg by mouth daily.     traZODone  (DESYREL ) 50 MG tablet Take 150 mg by mouth at bedtime.     No current facility-administered medications for this encounter.     PHYSICAL EXAM: Vitals:   05/22/24 0842  BP: (!) 160/74  Pulse: 60  SpO2: 98%  Weight: 53.1 kg (117 lb)   General:  Well appearing. No resp difficulty HEENT: normal Neck: supple. no JVD. Carotids 2+ bilat; no bruits. No lymphadenopathy or thryomegaly appreciated. Cor: PMI nondisplaced. Regular rate & rhythm. 2/6 SEM RSB S2 ok  Lungs: clear Abdomen: soft, nontender, nondistended. No hepatosplenomegaly. No bruits or masses. Good bowel  sounds. Extremities: no cyanosis, clubbing, rash, edema Neuro: alert & orientedx3, cranial nerves grossly intact. moves all 4 extremities w/o difficulty. Affect pleasant   ECG AV paced 60 QRS Personally reviewed   ASSESSMENT & PLAN: 1. Status post bioprosthetic AVR: - Stable valve on echo. Compliant with SBE prophylaxis - Echo 5/22 looks good.  - Echo 05/22/24 EF 55-60% AVR ok Mean grad Personally reviewed - Continue yearly echos - Continue SBE prophylaxis 2. Complete heart block: Medtronic PCM.  Followed by Dr. Fernande.  - Followed by EP - Pacer interrogated in clinic: high degree of RV pacing but EF ok 3. Diagonal-PA fistula: Ligated at time of AVR.  4. HL -  Per PCP 5. HTN - BP up here but this is unusual for him. Follow BP at home let me know if SBP consistently > 150  6. 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 Appears fatigued in clinic - check HST  Toribio Fuel MD 05/22/2024

## 2024-05-22 NOTE — Addendum Note (Signed)
 Encounter addended by: Kiano Terrien B, RN on: 05/22/2024 9:52 AM  Actions taken: Clinical Note Signed, Order list changed, Diagnosis association updated

## 2024-05-23 NOTE — Addendum Note (Signed)
 Encounter addended by: Shiesha Jahn M, RN on: 05/23/2024 10:45 AM  Actions taken: Clinical Note Signed

## 2024-05-23 NOTE — Progress Notes (Signed)
 Spoke to patient he is aware he can proceed with [facility] home sleep study.

## 2024-05-25 ENCOUNTER — Encounter (INDEPENDENT_AMBULATORY_CARE_PROVIDER_SITE_OTHER): Payer: Self-pay | Admitting: Cardiology

## 2024-05-25 DIAGNOSIS — R0683 Snoring: Secondary | ICD-10-CM

## 2024-05-26 ENCOUNTER — Ambulatory Visit: Attending: Internal Medicine

## 2024-05-26 DIAGNOSIS — I5032 Chronic diastolic (congestive) heart failure: Secondary | ICD-10-CM

## 2024-05-26 NOTE — Procedures (Signed)
     SLEEP STUDY REPORT Patient Information Study Date: 05/25/2024 Patient Name: [name] Patient ID: [id number] Birth Date: 1941/08/19 Age: 83 Gender: Male BMI: 21.5 (W=117 lb, H=5' 2'') Stopbang: 5 Referring Physician: Rolan Fuel, MD  TEST DESCRIPTION: Home sleep apnea testing was completed using the WatchPat, a Type 1 device, utilizing peripheral arterial tonometry (PAT), chest movement, actigraphy, pulse oximetry, pulse rate, body position and snore. AHI was calculated with apnea and hypopnea using valid sleep time as the denominator. RDI includes apneas, hypopneas, and RERAs. The data acquired and the scoring of sleep and all associated events were performed in accordance with the recommended standards and specifications as outlined in the AASM Manual for the Scoring of Sleep and Associated Events 2.2.0 (2015).  FINDINGS: 1. No evidence of Obstructive Sleep Apnea with AHI 0.6/hr. 2. No Central Sleep Apnea. 3. Oxygen desaturations as low as 89%. 4. Mild to moderate snoring was present. O2 sats were < 88% for 0 minutes. 5. Total sleep time was 6 hrs and 55 min. 6. 25.1% of total sleep time was spent in REM sleep. 7. Normal sleep onset latency at 16 min. 8. Shortened REM sleep onset latency at 25 min. 9. Total awakenings were 2.  DIAGNOSIS: Normal study with no significant sleep disordered breathing.  RECOMMENDATIONS: 1. Normal study with no significant sleep disordered breathing. 2. Healthy sleep recommendations include: adequate nightly sleep (normal 7-9 hrs/night), avoidance of caffeine after noon and alcohol  near bedtime, and maintaining a sleep environment that is cool, dark and quiet. 3. Weight loss for overweight patients is recommended. 4. Snoring recommendations include: weight loss where appropriate, side sleeping, and avoidance of alcohol  before bed. 5. Operation of motor vehicle or dangerous equipment must be avoided when feeling drowsy, excessively  sleepy, or mentally fatigued. 6. An ENT consultation which may be useful for specific causes of and possible treatment of bothersome snoring . 7. Weight loss may be of benefit in reducing the severity of snoring.   Signature: Wilbert Bihari, MD; [facility]; Diplomat, American Board of Sleep Medicine Electronically Signed: 05/26/2024 12:27:39 PM

## 2024-05-28 ENCOUNTER — Telehealth: Payer: Self-pay | Admitting: *Deleted

## 2024-05-28 NOTE — Telephone Encounter (Signed)
-----   Message from Wilbert Bihari sent at 05/26/2024 12:29 PM EDT ----- Please let patient know that sleep study showed no significant sleep apnea.

## 2024-05-28 NOTE — Telephone Encounter (Signed)
 The patient has been notified of the result via his mychart.

## 2024-05-30 ENCOUNTER — Encounter: Payer: Self-pay | Admitting: Internal Medicine

## 2024-06-02 NOTE — Progress Notes (Signed)
 Remote PPM Transmission

## 2024-06-13 ENCOUNTER — Ambulatory Visit (INDEPENDENT_AMBULATORY_CARE_PROVIDER_SITE_OTHER): Payer: Medicare Other

## 2024-06-13 DIAGNOSIS — I442 Atrioventricular block, complete: Secondary | ICD-10-CM

## 2024-06-17 LAB — CUP PACEART REMOTE DEVICE CHECK
Battery Impedance: 1974 Ohm
Battery Remaining Longevity: 38 mo
Battery Voltage: 2.75 V
Brady Statistic AP VP Percent: 40 %
Brady Statistic AP VS Percent: 0 %
Brady Statistic AS VP Percent: 60 %
Brady Statistic AS VS Percent: 0 %
Date Time Interrogation Session: 20251017065018
Implantable Lead Connection Status: 753985
Implantable Lead Connection Status: 753985
Implantable Lead Implant Date: 20140625
Implantable Lead Implant Date: 20140625
Implantable Lead Location: 753859
Implantable Lead Location: 753860
Implantable Lead Model: 5076
Implantable Lead Model: 5076
Implantable Pulse Generator Implant Date: 20140625
Lead Channel Impedance Value: 476 Ohm
Lead Channel Impedance Value: 482 Ohm
Lead Channel Pacing Threshold Amplitude: 0.625 V
Lead Channel Pacing Threshold Amplitude: 0.875 V
Lead Channel Pacing Threshold Pulse Width: 0.4 ms
Lead Channel Pacing Threshold Pulse Width: 0.4 ms
Lead Channel Setting Pacing Amplitude: 1.5 V
Lead Channel Setting Pacing Amplitude: 2 V
Lead Channel Setting Pacing Pulse Width: 0.4 ms
Lead Channel Setting Sensing Sensitivity: 2 mV
Zone Setting Status: 755011
Zone Setting Status: 755011

## 2024-06-19 NOTE — Progress Notes (Signed)
 Remote PPM Transmission

## 2024-06-22 ENCOUNTER — Ambulatory Visit: Payer: Self-pay | Admitting: Student in an Organized Health Care Education/Training Program

## 2024-06-27 ENCOUNTER — Ambulatory Visit (HOSPITAL_COMMUNITY): Payer: Self-pay | Admitting: Internal Medicine

## 2024-07-02 ENCOUNTER — Other Ambulatory Visit: Payer: Self-pay | Admitting: Nurse Practitioner

## 2024-07-02 DIAGNOSIS — D3A8 Other benign neuroendocrine tumors: Secondary | ICD-10-CM

## 2024-07-02 DIAGNOSIS — C7A8 Other malignant neuroendocrine tumors: Secondary | ICD-10-CM

## 2024-07-08 ENCOUNTER — Ambulatory Visit: Payer: Self-pay | Admitting: Cardiology

## 2024-07-31 DIAGNOSIS — D2261 Melanocytic nevi of right upper limb, including shoulder: Secondary | ICD-10-CM | POA: Diagnosis not present

## 2024-07-31 DIAGNOSIS — D2272 Melanocytic nevi of left lower limb, including hip: Secondary | ICD-10-CM | POA: Diagnosis not present

## 2024-07-31 DIAGNOSIS — Z85828 Personal history of other malignant neoplasm of skin: Secondary | ICD-10-CM | POA: Diagnosis not present

## 2024-07-31 DIAGNOSIS — L570 Actinic keratosis: Secondary | ICD-10-CM | POA: Diagnosis not present

## 2024-07-31 DIAGNOSIS — D225 Melanocytic nevi of trunk: Secondary | ICD-10-CM | POA: Diagnosis not present

## 2024-07-31 DIAGNOSIS — L814 Other melanin hyperpigmentation: Secondary | ICD-10-CM | POA: Diagnosis not present

## 2024-07-31 DIAGNOSIS — L821 Other seborrheic keratosis: Secondary | ICD-10-CM | POA: Diagnosis not present

## 2024-07-31 DIAGNOSIS — D485 Neoplasm of uncertain behavior of skin: Secondary | ICD-10-CM | POA: Diagnosis not present

## 2024-07-31 DIAGNOSIS — D2262 Melanocytic nevi of left upper limb, including shoulder: Secondary | ICD-10-CM | POA: Diagnosis not present

## 2024-07-31 DIAGNOSIS — D044 Carcinoma in situ of skin of scalp and neck: Secondary | ICD-10-CM | POA: Diagnosis not present

## 2024-07-31 DIAGNOSIS — D2271 Melanocytic nevi of right lower limb, including hip: Secondary | ICD-10-CM | POA: Diagnosis not present

## 2024-07-31 DIAGNOSIS — L309 Dermatitis, unspecified: Secondary | ICD-10-CM | POA: Diagnosis not present

## 2024-08-04 ENCOUNTER — Inpatient Hospital Stay
Admission: RE | Admit: 2024-08-04 | Discharge: 2024-08-04 | Disposition: A | Source: Ambulatory Visit | Attending: Nurse Practitioner | Admitting: Nurse Practitioner

## 2024-08-04 DIAGNOSIS — D3A8 Other benign neuroendocrine tumors: Secondary | ICD-10-CM

## 2024-08-04 DIAGNOSIS — C7A8 Other malignant neuroendocrine tumors: Secondary | ICD-10-CM

## 2024-08-04 DIAGNOSIS — N200 Calculus of kidney: Secondary | ICD-10-CM | POA: Diagnosis not present

## 2024-08-04 MED ORDER — IOPAMIDOL (ISOVUE-300) INJECTION 61%
100.0000 mL | Freq: Once | INTRAVENOUS | Status: AC | PRN
  Administered 2024-08-04: 100 mL via INTRAVENOUS

## 2024-08-11 DIAGNOSIS — D3A8 Other benign neuroendocrine tumors: Secondary | ICD-10-CM | POA: Diagnosis not present

## 2024-08-12 DIAGNOSIS — H906 Mixed conductive and sensorineural hearing loss, bilateral: Secondary | ICD-10-CM | POA: Diagnosis not present

## 2024-09-12 ENCOUNTER — Ambulatory Visit: Attending: Internal Medicine

## 2024-09-12 DIAGNOSIS — I442 Atrioventricular block, complete: Secondary | ICD-10-CM

## 2024-09-15 LAB — CUP PACEART REMOTE DEVICE CHECK
Battery Impedance: 2062 Ohm
Battery Remaining Longevity: 37 mo
Battery Voltage: 2.75 V
Brady Statistic AP VP Percent: 41 %
Brady Statistic AP VS Percent: 0 %
Brady Statistic AS VP Percent: 59 %
Brady Statistic AS VS Percent: 0 %
Date Time Interrogation Session: 20260116081014
Implantable Lead Connection Status: 753985
Implantable Lead Connection Status: 753985
Implantable Lead Implant Date: 20140625
Implantable Lead Implant Date: 20140625
Implantable Lead Location: 753859
Implantable Lead Location: 753860
Implantable Lead Model: 5076
Implantable Lead Model: 5076
Implantable Pulse Generator Implant Date: 20140625
Lead Channel Impedance Value: 498 Ohm
Lead Channel Impedance Value: 511 Ohm
Lead Channel Pacing Threshold Amplitude: 0.625 V
Lead Channel Pacing Threshold Amplitude: 0.75 V
Lead Channel Pacing Threshold Pulse Width: 0.4 ms
Lead Channel Pacing Threshold Pulse Width: 0.4 ms
Lead Channel Setting Pacing Amplitude: 1.5 V
Lead Channel Setting Pacing Amplitude: 2 V
Lead Channel Setting Pacing Pulse Width: 0.4 ms
Lead Channel Setting Sensing Sensitivity: 2 mV
Zone Setting Status: 755011
Zone Setting Status: 755011

## 2024-09-18 NOTE — Progress Notes (Signed)
 Remote PPM Transmission

## 2024-09-28 ENCOUNTER — Ambulatory Visit: Payer: Self-pay | Admitting: Student in an Organized Health Care Education/Training Program

## 2024-12-12 ENCOUNTER — Ambulatory Visit

## 2025-03-13 ENCOUNTER — Ambulatory Visit

## 2025-06-12 ENCOUNTER — Ambulatory Visit

## 2025-09-11 ENCOUNTER — Ambulatory Visit
